# Patient Record
Sex: Male | Born: 1940 | Race: White | Hispanic: No | Smoking: Former smoker
Health system: Southern US, Community
[De-identification: ages and names within clinical notes are randomized; demographics above are authoritative.]

## PROBLEM LIST (undated history)

## (undated) DIAGNOSIS — E7800 Pure hypercholesterolemia, unspecified: Secondary | ICD-10-CM

## (undated) DIAGNOSIS — Z95810 Presence of automatic (implantable) cardiac defibrillator: Secondary | ICD-10-CM

## (undated) DIAGNOSIS — I219 Acute myocardial infarction, unspecified: Secondary | ICD-10-CM

## (undated) DIAGNOSIS — E871 Hypo-osmolality and hyponatremia: Secondary | ICD-10-CM

## (undated) DIAGNOSIS — N182 Chronic kidney disease, stage 2 (mild): Secondary | ICD-10-CM

## (undated) DIAGNOSIS — I272 Pulmonary hypertension, unspecified: Secondary | ICD-10-CM

## (undated) DIAGNOSIS — Z79899 Other long term (current) drug therapy: Secondary | ICD-10-CM

## (undated) DIAGNOSIS — A809 Acute poliomyelitis, unspecified: Secondary | ICD-10-CM

## (undated) DIAGNOSIS — Z7901 Long term (current) use of anticoagulants: Secondary | ICD-10-CM

## (undated) DIAGNOSIS — E875 Hyperkalemia: Secondary | ICD-10-CM

## (undated) DIAGNOSIS — D649 Anemia, unspecified: Secondary | ICD-10-CM

## (undated) DIAGNOSIS — I509 Heart failure, unspecified: Secondary | ICD-10-CM

## (undated) DIAGNOSIS — I4891 Unspecified atrial fibrillation: Secondary | ICD-10-CM

## (undated) DIAGNOSIS — M1990 Unspecified osteoarthritis, unspecified site: Secondary | ICD-10-CM

## (undated) DIAGNOSIS — M109 Gout, unspecified: Secondary | ICD-10-CM

## (undated) DIAGNOSIS — I519 Heart disease, unspecified: Secondary | ICD-10-CM

## (undated) DIAGNOSIS — M19011 Primary osteoarthritis, right shoulder: Secondary | ICD-10-CM

## (undated) DIAGNOSIS — I10 Essential (primary) hypertension: Secondary | ICD-10-CM

## (undated) HISTORY — DX: Essential (primary) hypertension: I10

## (undated) HISTORY — DX: Unspecified atrial fibrillation: I48.91

## (undated) HISTORY — PX: OTHER SURGICAL HISTORY: SHX169

## (undated) HISTORY — DX: Long term (current) use of anticoagulants: Z79.01

## (undated) HISTORY — DX: Chronic kidney disease, stage 2 (mild): N18.2

## (undated) HISTORY — DX: Heart disease, unspecified: I51.9

## (undated) HISTORY — PX: CARDIOVERSION: SHX1299

## (undated) HISTORY — DX: Other long term (current) drug therapy: Z79.899

## (undated) HISTORY — DX: Acute poliomyelitis, unspecified: A80.9

## (undated) HISTORY — DX: Pure hypercholesterolemia, unspecified: E78.00

## (undated) HISTORY — DX: Anemia, unspecified: D64.9

## (undated) HISTORY — PX: CATARACT EXTRACTION, BILATERAL: SHX1313

---
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
## 1998-05-29 HISTORY — PX: KNEE ARTHROSCOPY: SHX127

## 2000-05-29 HISTORY — PX: CORONARY ARTERY BYPASS GRAFT: SHX141

## 2001-05-29 HISTORY — PX: CARDIAC CATHETERIZATION: SHX172

## 2001-10-11 ENCOUNTER — Ambulatory Visit (HOSPITAL_COMMUNITY): Admission: RE | Admit: 2001-10-11 | Discharge: 2001-10-11 | Payer: Self-pay | Admitting: Cardiology

## 2001-11-01 ENCOUNTER — Encounter: Payer: Self-pay | Admitting: Cardiothoracic Surgery

## 2001-11-06 ENCOUNTER — Inpatient Hospital Stay (HOSPITAL_COMMUNITY): Admission: RE | Admit: 2001-11-06 | Discharge: 2001-11-15 | Payer: Self-pay | Admitting: Cardiothoracic Surgery

## 2001-11-06 ENCOUNTER — Encounter: Payer: Self-pay | Admitting: Cardiothoracic Surgery

## 2001-11-07 ENCOUNTER — Encounter: Payer: Self-pay | Admitting: Cardiothoracic Surgery

## 2001-11-08 ENCOUNTER — Encounter: Payer: Self-pay | Admitting: Cardiothoracic Surgery

## 2001-11-09 ENCOUNTER — Encounter: Payer: Self-pay | Admitting: Cardiothoracic Surgery

## 2001-11-13 ENCOUNTER — Encounter: Payer: Self-pay | Admitting: Thoracic Surgery (Cardiothoracic Vascular Surgery)

## 2001-12-03 ENCOUNTER — Encounter (HOSPITAL_COMMUNITY): Admission: RE | Admit: 2001-12-03 | Discharge: 2002-03-03 | Payer: Self-pay | Admitting: Cardiology

## 2002-03-04 ENCOUNTER — Encounter (HOSPITAL_COMMUNITY): Admission: RE | Admit: 2002-03-04 | Discharge: 2002-03-14 | Payer: Self-pay | Admitting: Cardiology

## 2006-08-21 ENCOUNTER — Ambulatory Visit: Admission: RE | Admit: 2006-08-21 | Discharge: 2006-08-21 | Payer: Self-pay | Admitting: Cardiology

## 2006-10-19 ENCOUNTER — Ambulatory Visit (HOSPITAL_COMMUNITY): Admission: RE | Admit: 2006-10-19 | Discharge: 2006-10-19 | Payer: Self-pay | Admitting: Cardiology

## 2007-05-30 HISTORY — PX: CARDIAC CATHETERIZATION: SHX172

## 2008-02-20 ENCOUNTER — Inpatient Hospital Stay (HOSPITAL_BASED_OUTPATIENT_CLINIC_OR_DEPARTMENT_OTHER): Admission: RE | Admit: 2008-02-20 | Discharge: 2008-02-20 | Payer: Self-pay | Admitting: Cardiology

## 2010-01-14 ENCOUNTER — Ambulatory Visit: Payer: Self-pay | Admitting: Cardiology

## 2010-01-28 ENCOUNTER — Ambulatory Visit: Payer: Self-pay | Admitting: Cardiology

## 2010-02-25 ENCOUNTER — Ambulatory Visit: Payer: Self-pay | Admitting: *Deleted

## 2010-03-25 ENCOUNTER — Ambulatory Visit: Payer: Self-pay | Admitting: Cardiology

## 2010-04-15 ENCOUNTER — Ambulatory Visit: Payer: Self-pay | Admitting: Cardiology

## 2010-05-13 ENCOUNTER — Ambulatory Visit: Payer: Self-pay | Admitting: Cardiology

## 2010-05-29 HISTORY — PX: CARDIAC CATHETERIZATION: SHX172

## 2010-06-15 ENCOUNTER — Ambulatory Visit: Payer: Self-pay | Admitting: Cardiovascular Disease

## 2010-07-11 ENCOUNTER — Other Ambulatory Visit (INDEPENDENT_AMBULATORY_CARE_PROVIDER_SITE_OTHER): Payer: Medicare Other

## 2010-07-11 DIAGNOSIS — Z7901 Long term (current) use of anticoagulants: Secondary | ICD-10-CM

## 2010-07-11 DIAGNOSIS — I4891 Unspecified atrial fibrillation: Secondary | ICD-10-CM

## 2010-08-08 ENCOUNTER — Encounter (INDEPENDENT_AMBULATORY_CARE_PROVIDER_SITE_OTHER): Payer: Medicare Other

## 2010-08-08 DIAGNOSIS — Z7901 Long term (current) use of anticoagulants: Secondary | ICD-10-CM

## 2010-08-08 DIAGNOSIS — I4891 Unspecified atrial fibrillation: Secondary | ICD-10-CM

## 2010-08-22 ENCOUNTER — Ambulatory Visit (INDEPENDENT_AMBULATORY_CARE_PROVIDER_SITE_OTHER): Payer: Medicare Other | Admitting: *Deleted

## 2010-08-22 DIAGNOSIS — I4821 Permanent atrial fibrillation: Secondary | ICD-10-CM | POA: Insufficient documentation

## 2010-08-22 DIAGNOSIS — I4891 Unspecified atrial fibrillation: Secondary | ICD-10-CM

## 2010-08-22 DIAGNOSIS — Z7901 Long term (current) use of anticoagulants: Secondary | ICD-10-CM

## 2010-08-22 LAB — POCT INR: INR: 2.2

## 2010-09-26 ENCOUNTER — Encounter: Payer: PRIVATE HEALTH INSURANCE | Admitting: *Deleted

## 2010-09-26 ENCOUNTER — Ambulatory Visit (INDEPENDENT_AMBULATORY_CARE_PROVIDER_SITE_OTHER): Payer: Medicare Other | Admitting: *Deleted

## 2010-09-26 DIAGNOSIS — I4891 Unspecified atrial fibrillation: Secondary | ICD-10-CM

## 2010-09-26 LAB — POCT INR: INR: 2.4

## 2010-09-27 ENCOUNTER — Encounter: Payer: Self-pay | Admitting: Nurse Practitioner

## 2010-09-27 ENCOUNTER — Ambulatory Visit (INDEPENDENT_AMBULATORY_CARE_PROVIDER_SITE_OTHER): Payer: Medicare Other | Admitting: Nurse Practitioner

## 2010-09-27 VITALS — BP 108/62 | HR 51 | Ht 76.0 in | Wt 260.2 lb

## 2010-09-27 DIAGNOSIS — I10 Essential (primary) hypertension: Secondary | ICD-10-CM

## 2010-09-27 DIAGNOSIS — I5022 Chronic systolic (congestive) heart failure: Secondary | ICD-10-CM | POA: Insufficient documentation

## 2010-09-27 DIAGNOSIS — Z7901 Long term (current) use of anticoagulants: Secondary | ICD-10-CM

## 2010-09-27 DIAGNOSIS — I4891 Unspecified atrial fibrillation: Secondary | ICD-10-CM

## 2010-09-27 DIAGNOSIS — R0609 Other forms of dyspnea: Secondary | ICD-10-CM

## 2010-09-27 DIAGNOSIS — I519 Heart disease, unspecified: Secondary | ICD-10-CM

## 2010-09-27 DIAGNOSIS — Z01818 Encounter for other preprocedural examination: Secondary | ICD-10-CM

## 2010-09-27 DIAGNOSIS — E7800 Pure hypercholesterolemia, unspecified: Secondary | ICD-10-CM

## 2010-09-27 DIAGNOSIS — Z79899 Other long term (current) drug therapy: Secondary | ICD-10-CM

## 2010-09-27 DIAGNOSIS — R0600 Dyspnea, unspecified: Secondary | ICD-10-CM

## 2010-09-27 DIAGNOSIS — R0602 Shortness of breath: Secondary | ICD-10-CM

## 2010-09-27 DIAGNOSIS — I2510 Atherosclerotic heart disease of native coronary artery without angina pectoris: Secondary | ICD-10-CM

## 2010-09-27 LAB — BASIC METABOLIC PANEL
BUN: 30 mg/dL — ABNORMAL HIGH (ref 6–23)
CO2: 29 mEq/L (ref 19–32)
Calcium: 9 mg/dL (ref 8.4–10.5)
Chloride: 108 mEq/L (ref 96–112)
Creatinine, Ser: 1.3 mg/dL (ref 0.4–1.5)
GFR: 59.41 mL/min — ABNORMAL LOW (ref >60.00)
Glucose, Bld: 92 mg/dL (ref 70–99)
Potassium: 4.5 mEq/L (ref 3.5–5.1)
Sodium: 143 mEq/L (ref 135–145)

## 2010-09-27 LAB — TSH: TSH: 0.69 u[IU]/mL (ref 0.35–5.50)

## 2010-09-27 LAB — CBC WITH DIFFERENTIAL/PLATELET
Basophils Absolute: 0 10*3/uL (ref 0.0–0.1)
Basophils Relative: 0.5 % (ref 0.0–3.0)
Eosinophils Absolute: 0.1 10*3/uL (ref 0.0–0.7)
Eosinophils Relative: 2.6 % (ref 0.0–5.0)
HCT: 34.5 % — ABNORMAL LOW (ref 39.0–52.0)
Hemoglobin: 11.4 g/dL — ABNORMAL LOW (ref 13.0–17.0)
Lymphocytes Relative: 16 % (ref 12.0–46.0)
Lymphs Abs: 0.9 10*3/uL (ref 0.7–4.0)
MCHC: 33.2 g/dL (ref 30.0–36.0)
MCV: 92.7 fl (ref 78.0–100.0)
Monocytes Absolute: 0.7 10*3/uL (ref 0.1–1.0)
Monocytes Relative: 12.9 % — ABNORMAL HIGH (ref 3.0–12.0)
Neutro Abs: 3.8 10*3/uL (ref 1.4–7.7)
Neutrophils Relative %: 68 % (ref 43.0–77.0)
Platelets: 169 10*3/uL (ref 150.0–400.0)
RBC: 3.72 Mil/uL — ABNORMAL LOW (ref 4.22–5.81)
RDW: 15.6 % — ABNORMAL HIGH (ref 11.5–14.6)
WBC: 5.6 10*3/uL (ref 4.5–10.5)

## 2010-09-27 LAB — BRAIN NATRIURETIC PEPTIDE: Pro B Natriuretic peptide (BNP): 563 pg/mL — ABNORMAL HIGH (ref 0.0–100.0)

## 2010-09-27 LAB — APTT: aPTT: 31.1 s — ABNORMAL HIGH (ref 21.7–28.8)

## 2010-09-27 NOTE — Patient Instructions (Addendum)
We are going to arrange for a cardiac catheterization later this week with Dr. Swaziland. Procedure will be on Thursday May 3rd.    Angiography Angiography is a procedure used to look at the blood vessels (arteries) which carry the blood to different parts of your body. In this procedure a dye is injected through a catheter (a long, hollow tube about the size of a piece of cooked spaghetti) into an artery and x-rays are taken. The x-rays will show if there is a blockage or problem in a blood vessel.  PREPARATION FOR THE PROCEDURE  Let your caregiver know if you have had an allergy to dyes used in x-ray if you have ever had kidney problems or failure.   Do not eat or drink starting from midnight up to the time of the procedure, or as directed.   You may drink enough water to take your medications the morning of the procedure if you were instructed to do so.   You should be at the hospital or outpatient facility where the procedure is to be done prior to the procedure or as directed.  PROCEDURE: 1. You may be given a medication to help you relax before and during the procedure through an IV in your hand or arm.  2. A local anesthetic to make the area numb may be used before inserting the catheter.  3. You will be prepared for the procedure by washing and shaving the area where the catheter will be inserted. This is usually done in the groin but may be done in the fold of your arm by your elbow.  4. A specially trained doctor will insert the catheter with a guide wire into an artery. This is guided under a special type of x-ray (fluoroscopy) to the blood vessel being examined.  5. Special dye is then injected and x-rays are taken. These will show where any narrowing or blockages are located.  AFTER THE PROCEDURE  After the procedure you will be kept in bed for several hours.   The access site will be watched and you will be checked frequently.   Blood tests, other x-rays and an EKG may be done.     You may stay in the hospital overnight for observation.  SEEK IMMEDIATE MEDICAL CARE IF:  You develop chest pain, shortness of breath, feel faint, or pass out.   There is bleeding, swelling, or drainage from the catheter insertion site.   You develop pain, discoloration, coldness, or severe bruising in the leg or arm, or area where the catheter was inserted.   An oral temperature above 100 develops.  Document Released: 02/22/2005 Document Re-Released: 09/14/2007 [facility] Patient Information 2011 [place], [place].  We are also going to check an ultrasound of your heart. We are going to check your labs today. Do not take any more coumadin Increase your intake of greens over the next 2 days.

## 2010-09-27 NOTE — Progress Notes (Signed)
Jason Choi Date of Birth: 1941-05-01   History of Present Illness: Jason Choi is seen today for a work in visit. Jason Choi is seen for Dr. Swaziland. Jason Choi has not been feeling well for the past couple of months. Jason Choi had bronchitis a couple of months ago. Jason Choi was more short of breath and had a significant cough. His bronchitis slowly improved, but his shortness of breath has not. Jason Choi is no longer coughing. Jason Choi has some chest tightness if Jason Choi over exerts himself. Jason Choi is more fatigued. Jason Choi notes his symptoms are identical to how Jason Choi was feeling prior to his CABG that was 10 years ago. Jason Choi does not use NTG. Jason Choi does have more edema. Jason Choi has not used any extra Lasix. Jason Choi has known LV dysfunction with an EF of 32%. Last echo was in 2008.  Jason Choi is not aware of any arrhythmia. Jason Choi is on coumadin.  Current Outpatient Prescriptions on File Prior to Visit  Medication Sig Dispense Refill  . amiodarone (PACERONE) 200 MG tablet Take 200 mg by mouth daily.        Marland Kitchen aspirin 81 MG tablet Take 81 mg by mouth daily.        Marland Kitchen atorvastatin (LIPITOR) 20 MG tablet Take 40 mg by mouth daily.       . carvedilol (COREG) 25 MG tablet Take 25 mg by mouth 2 (two) times daily with a meal.        . furosemide (LASIX) 40 MG tablet Take 40 mg by mouth daily.        . Multiple Vitamin (MULTIVITAMIN) tablet Take 1 tablet by mouth daily.        . potassium chloride SA (K-DUR,KLOR-CON) 20 MEQ tablet Take 20 mEq by mouth daily.        . ramipril (ALTACE) 2.5 MG capsule Take 2.5 mg by mouth daily.        . Warfarin Sodium (COUMADIN PO) Take 5 mg by mouth daily. As directed.         Allergies  Allergen Reactions  . Novocain     Past Medical History  Diagnosis Date  . CAD (coronary artery disease)   . S/P CABG (coronary artery bypass graft) 2002    SVG to PDA, Free radial to Intermediate, LIMA to LAD  . HTN (hypertension)   . Hypercholesteremia   . LV dysfunction     EF 35 to 40%  . Atrial fibrillation   . Chronic anticoagulation   . Polio   .  High risk medication use     on amiodarone    Past Surgical History  Procedure Date  . Coronary artery bypass graft   . Cardiac catheterization 2009    Grafts patent  . Right leg surgeries     due to polio  . Left knee arthroscopy     History  Smoking status  . Former Smoker  Smokeless tobacco  . Not on file  Comment: stopped smoking over 30 years ago.    History  Alcohol Use  . Yes    occasional beer    Family History  Problem Relation Age of Onset  . Valvular heart disease Father     Review of Systems: The review of systems is as above.  All other systems were reviewed and are negative.  Physical Exam: BP 108/62  Pulse 51  Ht 6\' 4"  (1.93 m)  Wt 260 lb 3.2 oz (118.026 kg)  BMI 31.67 kg/m2 Patient is very pleasant and in no  acute distress. Jason Choi is obese and of large build.  Skin is warm and dry. Color is normal.  HEENT is unremarkable. Normocephalic/atraumatic. PERRL. Sclera are nonicteric. Neck is supple. No masses. No JVD. Lungs are clear. Cardiac exam shows a regular rate and rhythm. Jason Choi is bradycardic. Abdomen is soft and obese. Extremities are with 1+ edema, left greater than right. Right leg is atrophied due to polio. Gait and ROM are intact. No gross neurologic deficits noted.  LABORATORY DATA:  EKG shows sinus bradycardia. INR yesterday was 2.4   Assessment / Plan:

## 2010-09-27 NOTE — Assessment & Plan Note (Signed)
Blood pressure is satisfactory and actually on the lower side of normal. May consider titration of his ACE in the future.

## 2010-09-27 NOTE — Assessment & Plan Note (Signed)
BNP is checked today. He will also have a right heart catheterization on Thursday with Dr. Swaziland and we will arrange for a repeat Echo.

## 2010-09-27 NOTE — Assessment & Plan Note (Addendum)
He is having symptoms identical to his prior chest pain syndrome that led to CABG. I have discussed his care with Dr. Swaziland. We will arrange for a repeat cardiac cath. His last stress test in 2009 was abnormal and led to cardiac cath. At that time his grafts were patent and medical management was recommended. The procedure was reviewed with him including the risks and benefits and he is agreeable to proceed.

## 2010-09-27 NOTE — Assessment & Plan Note (Signed)
INR was 2.4 yesterday.  We will hold his coumadin and he will increase his intake of dark greens today and tomorrow.

## 2010-09-27 NOTE — Assessment & Plan Note (Signed)
He remains in sinus. He is on amiodarone. May need to consider PFT's on a return visit.

## 2010-09-29 ENCOUNTER — Other Ambulatory Visit: Payer: Self-pay | Admitting: Cardiology

## 2010-09-29 ENCOUNTER — Other Ambulatory Visit: Payer: Self-pay | Admitting: *Deleted

## 2010-09-29 ENCOUNTER — Ambulatory Visit (HOSPITAL_COMMUNITY): Payer: Medicare Other

## 2010-09-29 ENCOUNTER — Ambulatory Visit (HOSPITAL_COMMUNITY)
Admission: RE | Admit: 2010-09-29 | Discharge: 2010-09-29 | Disposition: A | Discharge status: Home / Self Care | Payer: Medicare Other | Source: Ambulatory Visit | Attending: Cardiology | Admitting: Cardiology

## 2010-09-29 ENCOUNTER — Telehealth: Payer: Self-pay | Admitting: *Deleted

## 2010-09-29 DIAGNOSIS — I2510 Atherosclerotic heart disease of native coronary artery without angina pectoris: Secondary | ICD-10-CM | POA: Insufficient documentation

## 2010-09-29 DIAGNOSIS — I2789 Other specified pulmonary heart diseases: Secondary | ICD-10-CM | POA: Insufficient documentation

## 2010-09-29 DIAGNOSIS — Z951 Presence of aortocoronary bypass graft: Secondary | ICD-10-CM | POA: Insufficient documentation

## 2010-09-29 DIAGNOSIS — I519 Heart disease, unspecified: Secondary | ICD-10-CM | POA: Insufficient documentation

## 2010-09-29 LAB — POCT I-STAT 3, VENOUS BLOOD GAS (G3P V)
Acid-base deficit: 4 mmol/L — ABNORMAL HIGH (ref 0.0–2.0)
Bicarbonate: 21.9 mEq/L (ref 20.0–24.0)
O2 Saturation: 58 %
TCO2: 23 mmol/L (ref 0–100)

## 2010-09-29 LAB — POCT I-STAT 3, ART BLOOD GAS (G3+)
O2 Saturation: 96 %
pO2, Arterial: 77 mmHg — ABNORMAL LOW (ref 80.0–100.0)

## 2010-09-29 MED ORDER — SPIRONOLACTONE 25 MG PO TABS: 25.0000 mg | ORAL_TABLET | Freq: Every day | ORAL | Status: DC

## 2010-09-29 MED ORDER — FUROSEMIDE 40 MG PO TABS: 40.0000 mg | ORAL_TABLET | Freq: Two times a day (BID) | ORAL | Status: DC

## 2010-09-29 NOTE — Telephone Encounter (Signed)
Per Dr. Swaziland:  Call in Lasix 40mg  BID and Aldactone 25mg  QD to [facility].  Pt notified

## 2010-09-29 NOTE — Telephone Encounter (Signed)
Refills to [facility] done

## 2010-09-30 NOTE — Telephone Encounter (Signed)
escribe medication per fax request  

## 2010-10-10 ENCOUNTER — Telehealth: Payer: Self-pay | Admitting: *Deleted

## 2010-10-10 ENCOUNTER — Ambulatory Visit (HOSPITAL_COMMUNITY): Payer: Medicare Other | Attending: Nurse Practitioner | Admitting: Radiology

## 2010-10-10 ENCOUNTER — Telehealth: Payer: Self-pay | Admitting: Cardiology

## 2010-10-10 DIAGNOSIS — I2510 Atherosclerotic heart disease of native coronary artery without angina pectoris: Secondary | ICD-10-CM

## 2010-10-10 DIAGNOSIS — I519 Heart disease, unspecified: Secondary | ICD-10-CM | POA: Insufficient documentation

## 2010-10-10 NOTE — Telephone Encounter (Signed)
Notified of echo results. Dr. Swaziland wants to cancel the app w/ Dr. Ladona Ridgel for now. Will see Lawson Fiscal 5/22.

## 2010-10-10 NOTE — Telephone Encounter (Signed)
Pt said he is returning your call from last wed please call

## 2010-10-10 NOTE — Telephone Encounter (Signed)
Message copied by Murrell Redden on Mon Oct 10, 2010  5:29 PM ------      Message from: Sw[place]iland, PETER      Created: Mon Oct 10, 2010  3:00 PM       I disagree with reported EF by Echo. He has severe hypokinesis to akinesis of the inferior wall and distal septum. EF looks more like 35-40%. Still in all it looks a little better than cath.       Theron Arista Swaziland

## 2010-10-10 NOTE — Telephone Encounter (Signed)
Had left message last week that 2 medications had been called in to pharm: aldactone and lasix. States he has been out of town.

## 2010-10-10 NOTE — Cardiovascular Report (Signed)
NAMEAMERY, MINASYAN NO.:  [id number]  MEDICAL RECORD NO.:  [id number]           PATIENT TYPE:  O  LOCATION:  [facility]                         FACILITY:  [facility]  PHYSICIAN:  Moesha Sarchet M. Choi, M.D.  DATE OF BIRTH:  1940-12-08  DATE OF PROCEDURE:  09/29/2010 DATE OF DISCHARGE:                           CARDIAC CATHETERIZATION   INDICATION FOR PROCEDURE:  The patient is a 70 year old white male who is status post coronary artery bypass surgery in 2002.  He has a history of left ventricular dysfunction.  He presents with symptoms of increasing shortness of breath over the past 2 months associated with chest tightness with exertion and fatigue.  PROCEDURES:  Right and left heart catheterization, coronary and left ventricular angiography.  ACCESS:  Via the right femoral artery and vein using standard Seldinger technique.  EQUIPMENT:  A 6-French 4 cm right and left Judkins catheter, 6-French pigtail catheter, 6-French LIMA catheter, 6-French LCB catheter, 6- French arterial sheath, 7-French venous sheath, 7-French balloon-tip Swan-Ganz catheter.  MEDICATIONS:  Local anesthesia with 1% Xylocaine.  CONTRAST:  110 mL of Omnipaque.  HEMODYNAMIC DATA:  Right atrial pressure is 14 mmHg.  Right ventricular pressure is 64 with an EDP of 17 mmHg.  Pulmonary artery pressure is 54/23 with a mean of 34 mmHg.  Pulmonary capillary wedge pressure is 40 with a mean of 30 mmHg.  Aortic pressure is 109/55 with a mean of 76 mmHg.  Left ventricular pressure is 109 with an EDP of 21 mmHg.  Cardiac output by Fick is 5.25 L per minute with an index of 2.14.  By thermodilution it is 5.09 with an index of 2.08.  There is no significant mitral or aortic valve gradient.  ANGIOGRAPHIC DATA:  Left ventricular angiography performed in the RAO view demonstrates an enlarged left ventricular chamber with basal to mid inferior wall akinesia.  There is severe global hypokinesia with  overall ejection fraction of 25% to 30%.  The left main coronary artery has 30% distal stenosis.  The left anterior descending artery is occluded after the first septal perforator and first diagonal.  The ramus intermediate branch is occluded proximally.  The left circumflex coronary artery is seen in the AV groove.  It has a 70% stenosis in the midvessel.  The first marginal branch is a small vessel and has a 90% proximal stenosis.  The second marginal branch is occluded.  The right coronary artery is a large dominant vessel.  It is occluded distally.  There are three posterolateral branches that fill by left-to- right collaterals.  Saphenous vein graft to the PDA is patent.  There is a free radial graft to the intermediate that is widely patent.  The LIMA graft to the LAD is widely patent.  FINAL INTERPRETATION: 1. Severe three-vessel obstructive atherosclerotic coronary artery     disease. 2. All grafts are patent including grafts to the PDA, intermediate     branch, and LAD. 3. Severe left ventricular dysfunction. 4. Moderate-to-severe pulmonary hypertension with elevated left     ventricular filling pressures.  PLAN:  We would recommend continued medical therapy with more aggressive diuretic therapy.  The patient will need to be considered for prophylactic ICD placement.          ______________________________ Jason Choi, M.D.     PMJ/MEDQ  D:  09/29/2010  T:  09/29/2010  Job:  [id number]  Electronically Signed by Vaeda Westall Choi M.D. on 10/10/2010 09:37:04 AM

## 2010-10-11 NOTE — H&P (Signed)
NAME:  Jason Choi, Jason Choi NO.:  [id number]   MEDICAL RECORD NO.:  [id number]          PATIENT TYPE:  OUT   LOCATION:  CARD                         FACILITY:  [facility]   PHYSICIAN:  Peter M. Swaziland, M.D.  DATE OF BIRTH:  1940-09-09   DATE OF ADMISSION:  08/21/2006  DATE OF DISCHARGE:  08/21/2006                              HISTORY & PHYSICAL   HISTORY OF PRESENT ILLNESS:  Mr. Laymon is a 70 year old white male who  presented with new onset of congestive heart failure in March of this  year.  He was found at that time to have new onset of atrial  fibrillation.  This was felt to have developed after an acute  respiratory infection.  The patient was subsequently anticoagulated.  His rate was controlled.  We treated his congestive heart failure with  good resolution of his volume overload.  He had an echocardiogram on  08/10/2006 which showed mild concentric LVH with global hypokinesia and  inferior wall akinesia.  There was overall moderate left ventricular  dysfunction with ejection fraction of 35-40%.  He had moderate left  atrial enlargement and mild mitral and tricuspid insufficiency.  The  patient also had baseline pulmonary function studies which demonstrated  a mild obstructive defect.  There was also mild decrease his diffusion  capacity.  He was orally loaded with amiodarone, but despite good rate  control he has failed to convert on medical therapy.  He is now admitted  to this time for elective cardioversion.   PAST MEDICAL HISTORY:  1. Congestive heart failure recent onset with moderate left      ventricular systolic dysfunction, acute on chronic.  2. Atrial fibrillation persistent.  3. Coronary disease status post CABG in 2003.  4. Hypertension.  5. History of recurrent upper respiratory infections.  6. Remote history of polio.  The patient has also had seven prior      operations on his right leg due to his polio and prior left knee      arthroscopy.   ALLERGIES:  He reports intolerance to NOVOCAIN which makes and  lightheaded.   CURRENT MEDICATIONS:  Coumadin 5 mg daily.  1. Altace 5 mg daily.  2. Lipitor 10 mg per day.  3. Coreg 25 mg b.i.d.  4. Lasix 40 mg per day.  5. Potassium 20 mEq per day.  6. Amiodarone 200 mg b.i.d.   SOCIAL HISTORY:  The patient is employed at [employer].  He quit smoking  30 years ago.  He drinks occasional beer.  He is married, has one child.   FAMILY HISTORY:  Father died at age 57 with aortic insufficiency.  Mother died age 92 of natural causes.  He has one sister who is alive  and well.   REVIEW OF SYSTEMS:  Is otherwise unremarkable.   PHYSICAL EXAMINATION:  GENERAL:  A pleasant white male in no apparent  distress.  VITAL SIGNS:  His weight is 243, blood pressure 120/70, pulse is 71 and  irregular.  HEENT EXAM:  He is normocephalic, atraumatic.  Pupils equal, round,  reactive to light and accommodation.  Extraocular movements is full.  Oropharynx is clear.  NECK:  Neck is supple without JVD, adenopathy, thyromegaly or bruits.  LUNGS:  Clear.  CARDIAC EXAM:  Reveals irregular rate and rhythm without gallop, murmur  or click.  ABDOMEN:  Soft, nontender without masses or bruits.  EXTREMITIES:  Without edema.  Pulses were 2+ and symmetric.  NEUROLOGICAL EXAM:  Nonfocal.   LABORATORY DATA:  ECG demonstrates atrial fibrillation with a  nonspecific ST-T wave abnormality.  Glucose is 89, BUN 25, creatinine  1.3, sodium 141, potassium 4.7, chloride 102, CO2 29, white count 7400,  hemoglobin 13.9, hematocrit 40.7, platelets 258,000.  INR on Oct 12, 2006 was 2.8.   IMPRESSION:  1. Persistent atrial fibrillation.  2. Congestive heart failure, acute on chronic left ventricular      systolic dysfunction.  3. Coronary disease status post coronary artery bypass graft.  4. Chronic anticoagulation.  5. Hypertension.  6. Recurrent upper respiratory infection.   PLAN:  The patient will be admitted  for outpatient elective  cardioversion.           ______________________________  Peter M. Swaziland, M.D.     PMJ/MEDQ  D:  10/17/2006  T:  10/17/2006  Job:  [id number]   cc:   Ace Gins, MD

## 2010-10-11 NOTE — Cardiovascular Report (Signed)
NAMELEILAN, Jason Choi NO.:  [id number]   MEDICAL RECORD NO.:  [id number]          PATIENT TYPE:  OIB   LOCATION:  1965                         FACILITY:  [facility]   PHYSICIAN:  Peter M. Swaziland, M.D.  DATE OF BIRTH:  10-Oct-1940   DATE OF PROCEDURE:  02/20/2008  DATE OF DISCHARGE:                            CARDIAC CATHETERIZATION   INDICATIONS FOR PROCEDURE:  A 70 year old white male status post  coronary artery bypass surgery in 2001.  He has a history of chronic  atrial fibrillation, hypertension, and hypercholesterolemia.  Recent  stress Cardiolite was abnormal showing inferolateral and apical defects  with both fixed and reversible components.  Ejection fraction was  decreased to 32%.   PROCEDURE:  Left heart catheterization, coronary and left ventricular  angiography, saphenous vein graft angiography x2 and LIMA graft  angiography.   ACCESS:  Via right femoral artery using standard Seldinger technique.   EQUIPMENT:  A 4-French, 5-cm left Judkins catheter, 4-French 3-DRC  catheter, 4-French LCB catheter, 4-French pigtail catheter, and 4-French  arterial sheath.   MEDICATIONS:  Local anesthesia 1% Xylocaine.   CONTRAST:  A 125 mL of Omnipaque.   HEMODYNAMIC DATA:  Aortic pressure is 131/62 with a mean of 91 mmHg.  Left ventricular pressure is 132 with EDP of 23 mmHg.   ANGIOGRAPHIC DATA:  The left coronary artery arises and distributes  normally.  The left main coronary has 30-40% disease distally.   The left anterior descending artery has an eccentric 80-90% proximal  stenosis followed by total occlusion in the midvessel.   The left circumflex coronary is small in caliber.  There is 50-60%  disease in the proximal vessel.  There is a very small first obtuse  marginal vessel which has an 80-90% ostial stenosis.  The ongoing  circumflex in the AV groove is without significant disease.   The right coronary artery arises and distributes normally.  It is  a  dominant vessel.  It is occluded distally.  The posterolateral branches  fill very well by left-to-right collaterals.   The saphenous vein graft to the PDA is widely patent with good runoff.  PDA is very small in caliber.   The free radial artery graft to the intermediate branch is widely  patent.  This was a moderate-to-large branch and appears normal.   The LIMA graft to LAD is widely patent with good distal runoff.   Left ventricular angiography was performed in RAO view.  This  demonstrates severe inferior wall hypokinesia.  There is mild left  ventricular dilatation and overall moderate-to-severe left ventricular  dysfunction with an ejection fraction estimated at 35-40%.   FINAL INTERPRETATION:  1. Severe 3-vessel obstructive atherosclerotic coronary artery      disease.  2. All grafts were patent including saphenous vein graft to the      posterior descending artery, radial artery graft to the      intermediate branch, and left internal mammary graft to the left      anterior descending.  3. Moderate-to-severe left ventricular dysfunction.   PLAN:  We would recommend continued medical therapy.  ______________________________  Peter M. Swaziland, M.D.     PMJ/MEDQ  D:  02/20/2008  T:  02/21/2008  Job:  [id number]   cc:   Ace Gins, MD

## 2010-10-11 NOTE — H&P (Signed)
NAMECHRISTIAN, BORGERDING NO.:  [id number]   MEDICAL RECORD NO.:  [id number]          PATIENT TYPE:  OIB   LOCATION:  NA                           FACILITY:  [facility]   PHYSICIAN:  Peter M. Swaziland, M.D.  DATE OF BIRTH:  [birth year]   DATE OF ADMISSION:  08/20/2007  DATE OF DISCHARGE:                              HISTORY & PHYSICAL   HISTORY OF PRESENT ILLNESS:  Mr. Jason Choi is a 70 year old white male  with known history of coronary artery disease.  He is status post  coronary artery bypass surgery in 2003 by Dr. Kathlee Nations Trigt, this  include an LIMA graft to the LAD, a radial artery graft to the ramus  intermediate, and a vein graft to the posterior descending artery.  He  also has a history of atrial fibrillation.  He has been on chronic  amiodarone therapy and chronic anticoagulation.  He has had congestive  heart failure.  The patient is actually felt fairly well.  Recently, he  has had no significant chest pain, palpitations, increased dyspnea or an  edema.  He had had no orthopnea or PND.  He did have a followup stress  Cardiolite study recently.  He was able to walk 5 minutes on the Bruce  protocol.  He was limited by dyspnea and leg fatigue.  He had no  significant ectopy.  His Cardiolite images demonstrate a moderate  inferior and inferolateral wall defect as well as an apical defect and  these defects were partially reversible.  His ejection fraction was  decreased to 32% compared to his prior stress test in July 2005.  The  apical defect was new and his LV function had deteriorated.  For that  reason, we had recommended followup cardiac catheterization to see if he  needed further revascularization or to consider whether he would be a  candidate for a defibrillator.   PAST MEDICAL HISTORY:  1. Coronary artery disease, status post CABG in 2003.  2. Atrial fibrillation, controlled on amiodarone therapy.  3. Hypertension.  4. Hypercholesterolemia.  5.  Congestive heart failure.  6. History of recurrent upper respiratory infections.  7. Remote history of polio with seven prior operations on his right      leg due to his polio.  The patient has also had previous left knee      arthroscopy.   His current medications include,  1. Multivitamin daily.  2. Altace 2.5 mg daily.  3. Lipitor 10 mg per day.  4. Coreg 25 mg twice a day.  5. Lasix 40 mg per day.  6. Potassium 20 mEq per day.  7. Amiodarone 200 mg per day.  8. Coumadin, dose has been 5 mg 6 days a week, and 2.5 mg once a week.      His Coumadin has been held for this procedure.   SOCIAL HISTORY:  The patient is employed at [employer].  He quit smoking  over 30 years ago.  Drinks occasional beer.  He is married and has one  child.   FAMILY HISTORY:  Father died at age 52 with complications  of aortic  insufficiency.  Mother died at age 33 of natural causes.  He has one  sister who is alive and well.   His review of systems is otherwise completely unremarkable.   PHYSICAL EXAM:  GENERAL:  The patient is well-developed, white male in  no apparent distress.  VITAL SIGNS:  His weight is 251, blood pressure 124/70, pulse 60 and  regular.  HEENT:  His pupils are equal, round, and reactive to light and  accommodation.  Extraocular movements are full.  Oropharynx is clear.  NECK:  Supple without JVD, adenopathy, thyromegaly, or bruits.  LUNGS:  Clear to auscultation and percussion.  CARDIAC:  Regular rate and rhythm without gallop, murmur, rub, or click.  ABDOMEN:  Soft, nontender without mass or bruits.  EXTREMITIES:  Without significant edema.  His pedal pulses are palpable.  NEUROLOGIC:  Nonfocal.   LABORATORY DATA:  His ECG shows normal sinus rhythm with nonspecific ST  abnormality.  Chest x-ray showed no active disease.  His protime was  15.4 with an INR of 1.5, glucose 121, BUN 20, creatinine 1.0, sodium  141, potassium 4.8.  CBC was normal.   IMPRESSION:  1. Coronary  artery disease, status post coronary artery bypass      grafting x3 in 2003.  The patient has abnormal stress Cardiolite      study showing new apical defect as well as decreased left      ventricular function.  2. Congestive heart failure, chronic class II secondary to systolic      dysfunction.  3. Atrial fibrillation, controlled on amiodarone.  4. Hypertension.   PLAN:  We will proceed with diagnostic cardiac catheterization with  further therapy pending these results.           ______________________________  Peter M. Swaziland, M.D.     PMJ/MEDQ  D:  02/19/2008  T:  02/20/2008  Job:  [id number]   cc:   Ace Gins, MD

## 2010-10-11 NOTE — Op Note (Signed)
NAMEABANOUB, HANKEN NO.:  [id number]   MEDICAL RECORD NO.:  [id number]          PATIENT TYPE:  OIB   LOCATION:  2867                         FACILITY:  [facility]   PHYSICIAN:  Peter M. Swaziland, M.D.  DATE OF BIRTH:  April 26, 1941   DATE OF PROCEDURE:  10/19/2006  DATE OF DISCHARGE:                               OPERATIVE REPORT   INDICATIONS:  A 70 year old white male with history of coronary disease  status post CABG.  He has persistent atrial fibrillation and symptoms of  congestive heart failure.   PROCEDURE:  Elective cardioversion.   PROCEDURE NOTE:  The patient received anesthesia with 300 mg of IV  Pentothal.  He subsequently received a single synchronized biphasic DC  shock at 150 joules with prompt restoration of sinus rhythm.  There were  no complications.   IMPRESSION:  Successful elective cardioversion.           ______________________________  Peter M. Swaziland, M.D.     PMJ/MEDQ  D:  10/19/2006  T:  10/19/2006  Job:  [id number]   cc:   Ace Gins, MD

## 2010-10-14 NOTE — H&P (Signed)
[place]  Patient:    Jason Choi, Jason Choi Visit Number: [id number] MRN: [id number]          Service Type: CAT Attending Physician:  Swaziland, Peter M Md Dictated by:   Peter M. Swaziland, M.D. Admit Date:  10/11/2001 Discharge Date: 10/11/2001   CC:         Jason Choi, M.D.                         History and Physical  DATE OF BIRTH:  1940-07-25  HISTORY OF PRESENT ILLNESS:  [name] is a 70 year old white male who presented last month with new onset of congestive heart failure.  The patient had been having increasing shortness of breath and lack of energy forthe past year.  It intensified in January of this year.  He developed pneumonia at that time that was treated as an outpatient.  Despite clearingof his pneumonia, he had persistent shortness of breath, later developing lower extremity edema. He denied any symptoms of chest pain or prior myocardial infarction.  Chest x-ray demonstrated interval increase in heart size and vascular congestion. An echocardiogram was obtained which showed a dilated left ventricle with severe global hypokinesia and an ejection fraction of 25-35%; there was also evidence of severe pulmonary hypertension with estimated right ventricular systolic pressure of 73 mmHg.  Hehad moderate tricuspid and mitral insufficiency and the left atrium was severely enlarged.  The patient was subsequently referred for evaluation. He had previously been on verapamil for blood pressure control; this was stopped and he was begun on diuretics and ACE inhibitor.  He seemed to respond very well to this therapy, with resolution of his shortness of breath and improvement in his energy level.  He subsequently underwent evaluation with a nuclear stress test.  The patient was only able to exercisefor four minutes on the Bruce protocol before test had to be stopped dueto dyspnea and leg fatigue.  He did have ST segment changes in  the lateral leads consistent with ischemia.  Subsequently Cardiolite images demonstrated multiple perfusion defects involving the anterior septum, apex and inferolateral wall with component of ischemia in these areas.  He once again had severe left ventricular dysfunction with ejection fraction of 30%. Because of his abnormal Cardiolite study, he is now admitted for cardiac catheterization for further evaluation of ischemic heart disease.  PAST MEDICAL HISTORY:  History of pneumonia in January of this year.  History of long-standing hypertension.  History of polio as a child requiring seven previous right leg operations for polio.  He has had previous left knee arthroscopy.  ALLERGIES:  NOVOCAINE in the past has made him light-headed.   CURRENT MEDICATIONS: 1. Glucosamine two tablets daily. 2. Ginkgo 40 mg per day. 3. Multivitamin daily. 4. Altace 5 mg b.i.d. 5. Lasix 40 mg per day. 6. Aspirin 81 mg per day. 7. Coreg 6.25 mg b.i.d.  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 He is married.  He drinks occasional beer.  He quit smoking 26 years ago.  FAMILY HISTORY:  Father died at age 31 with a history of cardiac murmur and aortic insufficiency.  Motherdied at age 83 of natural causes.  One sister is alive and well.  REVIEW OF SYSTEMS:  Review of systems is remarkable for resolution of his lower extremity edema and fatigue.  He has no orthopnea or PND.  He denies any chest  pain.  He has had a persistent productive cough.  He has no TIAor stroke.  Other review of systems are negative.  PHYSICAL EXAMINATION:  GENERAL:  The patient is an obese white male in no apparentdistress.  Weight is 242.  VITAL SIGNS:  Blood pressure is 150/90.  Pulse 70 and regular.  HEENT:  Unremarkable.  Pupils are equal, round and reactive.  Conjunctivae are clear.  Oropharynx is clear.  NECK:  Without JVD, adenopathy or bruits.  No jugular venous  distention.  LUNGS:  Clear.  CARDIAC:  Regular rate and rhythm with a grade 1-2/6 apical systolic murmur. There is no S3.  ABDOMEN:  Obese,soft and nontender without hepatosplenomegaly, masses or bruits.  EXTREMITIES:  Femoral and pedal pulses are 2+ and symmetric.  He has chronic wasting in his right leg due to polio.  He has no significant edema atthis time.  NEUROLOGIC:  Exam is nonfocal.  LABORATORY AND ACCESSORY DATA:  ECG shows normal sinus rhythm with left atrial enlargement and nonspecific T wave abnormality.  IMPRESSION: 1. Congestive heart failure, now well-compensated. 2. Ischemic cardiomyopathy withmarkedly abnormal Cardiolite study. 3. Hypertension. 4. Recurrent bronchitis. 5. Remote history of polio.  PLAN:  The patient will be admitted for cardiac catheterization, with further therapy pending these results. Dictated by:   Peter M. Swaziland, M.D. Attending Physician:  [place]il[place], Peter M Md DD:  10/10/01 TD:  10/12/01 Job: [id number]

## 2010-10-14 NOTE — Procedures (Signed)
[place]  Patient:    Jason Choi, Jason Choi Visit Number: [id number] MRN: [id number]          Service Type: SUR Location: 2300 2301 01 Attending Physician:  Mikey Bussing Dictated by:   Shela Commons. Claybon Jabs, M.D. Proc. Date: 11/06/01 Admit Date:  11/06/2001                             Procedure Report  PROCEDURE:  Transesophageal echocardiogram.  DIAGNOSES: 1. Coronary artery disease. 2. Mitral regurgitation.  INDICATIONS FOR PROCEDURE:  Mr. Stephenson Cichy is a 70 year old white male who presents to the operating room for coronary artery bypass grafting and possible mitral valve repair.  Dr. Kathlee Nations Trigt requested transesophageal echocardiogram for the management of this patient intraoperatively.  DESCRIPTION OF PROCEDURE:  Following routine cardiac induction, the transesophageal probe was carefully lubricated and inserted into the patients esophagus for cardiac imaging.  Initially, there was difficulty with the probe.  The multi-plane imaging function of the probe would not work properly, so after diagnosis with the echo-text we were able to do some manipulation with the multi-plane manually with the console, although this was doable, it was not optimal for all imaging planes.  As well, images of the heart demonstrated a large heart with no evidence of pericardial effusion.  The right atrium was normal in size.  The atrial septal showed no evidence of defect.  There was no evidence of masses in the right atrium.  Tricuspid valve appeared normal in structure and function with trace regurgitation in the pulmonary catheter passing through the valve.  The right ventricle appeared to be functioning well, no evidence of regional wall motion abnormalities with the Swan visualized passing into the RV and out the RV outflow tract.  The left atrium was normal in size.  There was no evidence of masses in appendage or the atrium.  The mitral valve  appeared to be overall slightly thickened, but no evidence of flailed leaflets, masses, or excess calcium.  There was a mild jet of regurgitation which appeared to be central.  Both the right and left pulmonary veins were evaluated with Doppler, and these did not show abnormal flow patterns.  The left ventricle was then evaluated which showed some overall decreased contractility may be in the 40% ejection fraction range.  There was no evidence of regional wall motion abnormality, and the aortic valve appeared normal as well.  There were three leaflets of the aortic valve.  There was no regurgitation or stenosis noted.  The decision was made not to repair the mitral valve.  The patient successfully separated from cardiopulmonary bypass, and overall mitral valve appeared to continue functioning as pre-bypass with mild mitral regurgitation.  The left ventricle showed some slightly decreased contractility, but no evidence of regional wall motion abnormalities.  The probe was carefully removed, and the patient was taken to the SICU in good condition. Dictated by:   Shela Commons. Claybon Jabs, M.D. Attending Physician:  Mikey Bussing DD:  11/06/01 TD:  11/08/01 Job: [id number]

## 2010-10-14 NOTE — Discharge Summary (Signed)
[place]  Patient:    Jason Choi, Jason Choi Visit Number: [id number] MRN: [id number]          Service Type: SUR Location: 2000 2040 01 Attending Physician:  Mikey Bussing Dictated by:   Dominica Severin, P.A. Admit Date:  11/06/2001 Discharge Date: 11/15/2001   CC:         Peter M. Swaziland, M.D.   Discharge Summary  DATE OF BIRTH:  1941/05/17  PRIMARY ADMISSION DIAGNOSIS:  Coronary artery disease and mitral regurgitation.  SECONDARY DIAGNOSES/PAST MEDICAL HISTORY: 1. Polio as a child with muscular atrophy of his right leg. 2. Hypertension. 3. Pneumonia in January 2003. 4. Surgical history positive for arthroscopy of the left knee and several    operations in the right leg for sequelae of poliomyelitis.  NEW DIAGNOSES/DISCHARGE DIAGNOSES: 1. Coronary artery disease, status post coronary artery bypass graft surgery    x3. 2. Postoperative atrial fibrillation with rapid ventricular response. 3. Postoperative nausea and vomiting.  PROCEDURES:  Coronary artery bypass graft surgery x3 with the following grafts placed:  Saphenous vein graft to the posterior descending, left radial artery to the ramus intermediate, and left internal mammary artery to the left anterior descending artery done on November 06, 2001.  HOSPITAL COURSE:  This patient was seen in consultation in our office by Dr. Donata Clay.  He is a 70 year old Caucasian male for evaluation of a potential surgical coronary vascularization.  He was diagnosed with three-vessel coronary artery disease and reduced left ventricular function. The patient has been ill since January 2003 when he developed pneumonia and was treated with outpatient antibiotics.  In addition, he had shortness of breath, dyspnea on exertion, productive cough, and lower extremity edema. With antibiotics, his productive cough improved but shortness of breath and edema did not.  Chest x-ray was performed and showed  cardiomegaly and pulmonary vascular congestion.  A 2-D echocardiogram was performed which showed reduced LV function with EF of 25%-35% and dilated left ventricle and a moderate mitral and tricuspid regurgitation.  He was evaluated by Dr. Swaziland. He underwent a cardiac catheterization which revealed stenosis of the diagonal and circumflex and occlusion of the right coronary artery and there was global hypokinesis with inferior and apical akinesis and an ejection fraction of 30%. There was mild to moderate mitral regurgitation.  Because of his severe three-vessel coronary artery disease and reduced left ventricular function, he was felt to be a candidate for coronary vascularization.  With adjustment of his medications, the patients symptoms of congestive heart failure have improved and he has finished his antibiotics.  It was determined that he was stable for bypass surgery, which he underwent on November 06, 2001.  Initially, postoperatively, he remained stable with a stable rhythm and was extubated later that evening of his surgery.  On postoperative day #1, neurologically he was intact.  He had good urine output.  He did have a small air leak from his mediastinal tubes; otherwise he was doing well.  His hematocrit was 35%.  On postoperative day #2, he was found to be in rapid atrial fibrillation.  He was started on amiodarone and Lovenox.  He was transferred to unit 2000.  His rate was controlled with amiodarone.  He remained stable from a pulmonary standpoint.  He did have a postoperative GI complication of some nausea and vomiting, which has since resolved.  Nevertheless, his main postoperative complication was atrial fibrillation, which he was started on anticoagulation therapy.  In  addition, he did have some blood pressure management problems with adjustment of his Coreg and other medications.  This eventually stabilized.  He was started on Coumadin.  Once his INR was therapeutic, he  was discharged home.  His blood pressure was stable.  His rate was controlled and he was discharged home in stable condition with home health nurses to discontinue his chest tube sutures and staples in one week.  He was ambulating without difficulty with cardiac rehab.  His wounds remained clean and dry without any signs of infection.  His left radial artery harvest site was clean and dry.  He had full function and sensation of his right hand.  DISCHARGE MEDICATIONS:  1. Lipitor 10 mg daily.  2. Altace 2.5 mg twice a day.  3. Amiodarone 200 mg 2 tablets twice a day.  4. Coumadin 5 mg tablets.  He is to take as directed by Dr. Swaziland.  5. Aspirin 81 mg.  6. Coreg 25 mg twice a day.  7. Digoxin 0.125 mg daily.  8. Diltiazem 180 mg daily.  9. Lasix 40 mg daily. 10. Potassium chloride 20 mEq daily. 11. Tylox 1-2 tablets every four hours as needed for pain.  ACTIVITY AND FOLLOWUP:  The patient was instructed not to do any driving, heavy lifting, or strenuous activity.  He is to walk daily and continue breathing exercises.  He is to follow a heart healthy diet.  He was told he may shower.  He is to notify the office with any increased temperature greater than 101 degrees Fahrenheit or if there is any increased redness, swelling, or drainage from his incision.  He is to have his staples taken out in one week by home health nurse.  He is to have his blood work checked by home health nurse and faxed to Dr. Illa Level office and Dr. Swaziland is to see him in two weeks with a chest x-ray.  He is to see Dr. Donata Clay in three weeks and the office will call him with that appointment.  He is instructed to bring his x-ray with him. Dictated by:   Dominica Severin, P.A. Attending Physician:  Mikey Bussing DD:  12/05/01 TD:  12/09/01 Job: [id number]

## 2010-10-14 NOTE — Op Note (Signed)
[place]. [facility]  Patient:    Jason Choi, Jason Choi Visit Number: [id number] MRN: [id number]          Service Type: SUR Location: 2000 2040 01 Attending Physician:  Mikey Bussing Dictated by:   Mikey Bussing, M.D. Proc. Date: 11/06/01 Admit Date:  11/06/2001   CC:         CVTS office  [facility], Attention:  Dr. Peter Swaziland   Operative Report  PREOPERATIVE DIAGNOSIS:  Class 4 progressive angina with severe three vessel coronary artery disease and reduced left ventricular function (ejection fraction 30%).  POSTOPERATIVE DIAGNOSIS:  Class 4 progressive angina with severe three vessel coronary artery disease and reduced left ventricular function (ejection fraction 30%).  OPERATION:  Coronary artery bypass grafting x3 (left internal mammary artery to LAD, left radial artery graft to ramus intermediate, saphenous vein graft to posterior descending).  SURGEON:  Mikey Bussing, M.D.  ASSISTANT:  Dominica Severin, P.A.-C.  ANESTHESIA:  General by J. Claybon Jabs, M.D.  INDICATIONS:  The patient is a 70 year old male who presents with a history of progressive angina.  Cardiac catheterization by Dr. Peter Swaziland demonstrated occlusion of the LAD, and high grade stenosis of the nondominant circumflex, and right coronary artery critical disease.  The ejection fraction was reduced with the prior inferior wall MI, and there is questionable mild to moderate mitral regurgitation.  He is referred for surgical coronary revascularization.  Prior to the operation, I examined the patient in the office, and reviewed the results of the cardiac cath with the patient and his wife.  The patient was treated for an episode of bronchitis prior to surgery which cleared before scheduling this operation.  While the patient was examined in the office, I reviewed the indications and expected benefits of coronary artery bypass grafting for treatment  of his coronary artery disease.  I discussed the major aspects of the proposed operation including the choice of conduits for grafting including the use of the left radial artery, the location of the surgical incisions, use of general anesthesia and cardiopulmonary bypass, and the expected postoperative hospital recovery.  I reviewed with the patient the alternatives to surgical therapy for treatment of his coronary artery disease. I also discussed with the patient and wife the risks associated with this operation to the patient including the risks of MI, CVA, bleeding, infection, and death.  They understood these implications for the surgery and agreed to proceed with the operation as planned under what I felt was an informed consent.  OPERATIVE FINDINGS:  The saphenous vein was taken from the left lower leg and it was small, but adequate.  The left radial artery was harvested after documenting an intact left palmar arch.  The radial artery was a normal vessel.  The left internal mammary artery was a small vessel for the patients body size (BFA 2.4), but had excellent flow.  The coronaries were severely diseased.  The circumflex distally was not graftable.  The distal posterolateral branch of the right coronary were also atretic and non-graftable.  The patient had a TEE in the operating room which showed no significant mitral regurgitation.  DESCRIPTION OF PROCEDURE:  The patient was brought to the operating room and placed supine on the operating table where general anesthesia was induced under invasive hemodynamic monitoring.  The chest, abdomen, and legs were prepped with Betadine, and draped as a sterile field.  The left arm was initially prepped and draped as a  separate field, and the left radial artery was harvested as a free graft, flushed with heparin and papaverine, and stored in a balanced electrolyte solution for later use.  Prior to removing the radial artery, the palmar  arch circulation was examined and found to be intact with a good pulse in the hand after clamping the radial artery.  A sternal incision was then made as the saphenous vein was harvested from the left lower leg.  The internal mammary artery was harvested as a pedicle graft from its origin at the subclavian vessels.  A sternal retractor was placed. Heparin was administered and the ACT was documented as being therapeutic. Three pursestrings were placed in the ascending aorta and right atrium.  The patient was cannulated and placed on bypass and cooled to 32 degrees.  The coronaries were identified for grafting, and the mammary artery and vein grafts were prepared for the distal anastomoses.  The radial artery was prepared with a separate cannula.  The cardioplegia cannulas were placed for both antegrade and retrograde delivery of cold blood cardioplegia, and the ascending aorta and the right atrium into the coronary sinus.  The patient was cooled to 30 degrees.  As the aortic cross-clamp was applied, a total of 800 cc of cold blood cardioplegia was delivered in split doses between the antegrade and retrograde catheters.  There was good cardioplegic arrest with the septal temperature dropping less than 15 degrees.  Topical ice saline slush was used to augment myocardial preservation, and a pericardial insufflator pad was used to protect the left phrenic nerve.  The distal coronary anastomoses were then performed.  The first distal anastomosis was to the posterior descending.  This was a 1.5 mm vessel with proximal 80% stenosis.  A reversed saphenous vein was sewn end-to-side with a running 7-0 Prolene with good flow through the graft.  The second distal anastomosis was to the ramus intermediate which was in the position of a high diagonal.  The left radial artery free graft was sewn in an end-to-side fashion.  It was a 1.5 mm vessel which had a proximal 95%  stenosis.  A running 8-0 was  used and there was excellent flow through the graft.  Cardioplegia was redosed.  The third distal anastomosis was to the distal third of the LAD which was totally occluded proximally.  The left internal mammary artery pedicle was brought through an opening created in the left lateral pericardium.  It was brought down on to the LAD and sewn end-to-side with a running 8-0 Prolene. There was excellent flow through the anastomosis with immediate rise in septal temperature after release of the pedicle clamp on the mammary artery.  The mammary and pedicle were secured to the epicardium and the aortic cross-clamp was removed.  The heart resumed a spontaneous rhythm.  A partial occlusion clamp was placed on the ascending aorta and two proximal anastomoses were performed using 7-0 Prolene to attach the radial artery to the aorta and running 6-0 Prolene to attach the vein graft.  The partial clamp was removed and the grafts were perfused.  Each had excellent flow and hemostasis was documented in the proximal and distal sites.  The patient was rewarmed and reperfused. Temporary pacing wires were applied.  The lungs reexpanded and the ventilator was resumed.  The patient was weaned from bypass on low dose dopamine with stable blood pressure and cardiac output.  Protamine was administered and there was no adverse reaction to the Protamine.  A transesophageal echo  exam showed no significant mitral regurgitation with some improvement in global left ventricular function.  The cannulas were removed and the mediastinum was irrigated with warm antibiotic irrigation.  The leg incision was irrigated and closed in the standard fashion.  The pericardium was loosely reapproximated.  Two mediastinal and a left pleural chest tube were placed and brought out through separate incisions.  The sternum was reapproximated with interrupted steel wire.  The pectoralis fascia was closed with interrupted Vicryl.   The subcutaneous and skin were closed with a running Vicryl and sterile dressings were applied.  Total bypass time was 140 minutes with aortic cross-clamp time of 50 minutes. Dictated by:   Mikey Bussing, M.D. Attending Physician:  Mikey Bussing DD:  11/06/01 TD:  11/08/01 Job: [id number]

## 2010-10-14 NOTE — Cardiovascular Report (Signed)
[place]. [facility]  Patient:    Jason Choi, Jason Choi Visit Number: [id number] MRN: [id number]          Service Type: CAT Attending Physician:  [place], Peter M Md Dictated by:   Peter M. [place], M.D. Proc. Date: 10/11/01 Admit Date:  10/11/2001 Discharge Date: 10/11/2001   CC:         Delorse Lek, M.D.  Alleen Borne, M.D.                        Cardiac Catheterization  INDICATIONS FOR PROCEDURE: The patient is a 70 year old white male who presented with congestive heart failure. Previous echocardiogram showed severe left ventricular dysfunction with an ejection fractionof 25-30%. After initiation of medical therapy, the patient underwent stress Cardiolite study which showed multiple perfusion abnormalities.  ACCESS: Via the right femoral artery using the standard Seldinger technique.  EQUIPMENT: The 6 French 4 cm right and left Judkins catheter, 6 French pigtail catheter, 6 French arterial sheath.  MEDICATIONS: Local anesthesia with 1% Xylocaine.  CONTRAST: Omnipaque 150 cc.  HEMODYNAMIC DATA: Aortic pressure was 123/81 with a mean of 100.  Left ventricular pressure is 117 with an EDP of 21 mmHg.  ANGIOGRAPHIC DATA: The left coronary artery arises and distributes normally. The left main coronary artery has moderate calcification with 20% narrowing in the distal left main.  The left anterior descending artery is also calcified proximally. It has diffuse disease in the proximal vessel with ashelflike stenosis of 70% prior to the takeoff of the first diagonal. The LAD is occluded after a small second diagonal branch and after the first septal perforator. The distal LAD fills by faint left to left collaterals.  The first diagonal branch is a large branch which has a 90% stenosis proximally. The second diagonal branch is a very small and diffuselydisease vessel with diffuse 90% stenoses.  The left circumflex coronary artery gives rise  to a single marginal branch. This branch then trifurcates into three smaller vessels. The initial two branches are verysmall. The first branch has a 90% stenosis. The second branch is occluded. The third branch of the marginal system is a moderate sized vessel andhas a 80-90% stenosis proximally.  The right coronary artery is a large dominant vessel. It has 30% narrowings in the proximal and mid vessel. The PDA has a 70% stenosis proximally.  The right coronary is then occluded following the PDA. The first and second posterolateral branches fillby left to right collaterals.  LEFT VENTRICULAR ANGIOGRAPHY: Left ventricular angiography demonstrates moderate left ventricular enlargement.There is severe global hypokinesia with akinesia of the apex. Ejection fraction is estimated at 30%. There is mild to moderate mitral insufficiency.  FINAL INTERPRETATION: 1. Severe three-vessel obstructive atherosclerotic coronary artery disease. 2. Moderate to severe left ventricular dysfunction. Dictated by:   Peter M. [place], M.D. Attending Physician:  [place], Peter M Md DD:  10/11/01 TD:  10/14/01 Job: [id number]

## 2010-10-18 ENCOUNTER — Telehealth: Payer: Self-pay | Admitting: *Deleted

## 2010-10-18 ENCOUNTER — Ambulatory Visit (INDEPENDENT_AMBULATORY_CARE_PROVIDER_SITE_OTHER): Payer: Medicare Other | Admitting: *Deleted

## 2010-10-18 ENCOUNTER — Encounter: Payer: Self-pay | Admitting: Nurse Practitioner

## 2010-10-18 ENCOUNTER — Ambulatory Visit (INDEPENDENT_AMBULATORY_CARE_PROVIDER_SITE_OTHER): Payer: Medicare Other | Admitting: Nurse Practitioner

## 2010-10-18 DIAGNOSIS — R0600 Dyspnea, unspecified: Secondary | ICD-10-CM

## 2010-10-18 DIAGNOSIS — I10 Essential (primary) hypertension: Secondary | ICD-10-CM

## 2010-10-18 DIAGNOSIS — Z7901 Long term (current) use of anticoagulants: Secondary | ICD-10-CM

## 2010-10-18 DIAGNOSIS — I519 Heart disease, unspecified: Secondary | ICD-10-CM

## 2010-10-18 DIAGNOSIS — I5020 Unspecified systolic (congestive) heart failure: Secondary | ICD-10-CM

## 2010-10-18 DIAGNOSIS — I4891 Unspecified atrial fibrillation: Secondary | ICD-10-CM

## 2010-10-18 DIAGNOSIS — R0609 Other forms of dyspnea: Secondary | ICD-10-CM

## 2010-10-18 DIAGNOSIS — I5022 Chronic systolic (congestive) heart failure: Secondary | ICD-10-CM

## 2010-10-18 LAB — BASIC METABOLIC PANEL
BUN: 51 mg/dL — ABNORMAL HIGH (ref 6–23)
CO2: 21 mEq/L (ref 19–32)
Calcium: 9.5 mg/dL (ref 8.4–10.5)
Chloride: 108 mEq/L (ref 96–112)
Creatinine, Ser: 2.3 mg/dL — ABNORMAL HIGH (ref 0.4–1.5)
GFR: 30.51 mL/min — ABNORMAL LOW (ref >60.00)
Glucose, Bld: 115 mg/dL — ABNORMAL HIGH (ref 70–99)
Potassium: 5.1 mEq/L (ref 3.5–5.1)
Sodium: 141 mEq/L (ref 135–145)

## 2010-10-18 LAB — POCT INR: INR: 2.1

## 2010-10-18 LAB — BRAIN NATRIURETIC PEPTIDE: Pro B Natriuretic peptide (BNP): 108 pg/mL — ABNORMAL HIGH (ref 0.0–100.0)

## 2010-10-18 NOTE — Assessment & Plan Note (Signed)
His grafts were patent at the time of cath. He is now on Aldactone. He is very well compensated at this time. He appears to be NYHA Class I to II at this time. We will check his labs today. May need to give consideration for a repeat echo in a couple of months after being on more CHF regimen and reassess his EF. Patient is agreeable to this plan and will call if any problems develop in the interim.

## 2010-10-18 NOTE — Progress Notes (Signed)
Jason Choi Date of Birth: 10-28-1940   History of Present Illness: Jason Choi is seen back today for a post hospital visit. He is seen for Dr. [place]. He has had a cardiac cath that showed his grafts to be patent. EF was 25 to 30%. His echo however, showed an EF of 35 to 40%. Plans for ICD are now on hold. He has been started on Aldactone. He is feeling so much better. He is not short of breath. His cough is gone. His weight is down considerably. He is very pleased with how he currently feels. He has had no issues with his groin. He is tolerating his medicines.   Current Outpatient Prescriptions on File Prior to Visit  Medication Sig Dispense Refill  . amiodarone (PACERONE) 200 MG tablet Take 200 mg by mouth daily.        Marland Kitchen aspirin 81 MG tablet Take 81 mg by mouth daily.        Marland Kitchen atorvastatin (LIPITOR) 20 MG tablet Take 40 mg by mouth daily.       . carvedilol (COREG) 25 MG tablet Take 25 mg by mouth 2 (two) times daily with a meal.        . furosemide (LASIX) 40 MG tablet Take 1 tablet (40 mg total) by mouth 2 (two) times daily.  180 tablet  3  . Multiple Vitamin (MULTIVITAMIN) tablet Take 1 tablet by mouth daily.        . potassium chloride SA (K-DUR,KLOR-CON) 20 MEQ tablet Take 20 mEq by mouth daily.        . ramipril (ALTACE) 2.5 MG capsule Take 10 mg by mouth daily.       Marland Kitchen spironolactone (ALDACTONE) 25 MG tablet Take 1 tablet (25 mg total) by mouth daily.  30 tablet  5  . Warfarin Sodium (COUMADIN PO) Take 5 mg by mouth daily. As directed.         Allergies  Allergen Reactions  . Novocain     Past Medical History  Diagnosis Date  . CAD (coronary artery disease)   . S/P CABG (coronary artery bypass graft) 2002    SVG to PDA, Free radial to Intermediate, LIMA to LAD  . HTN (hypertension)   . Hypercholesteremia   . LV dysfunction     EF 35 to 40%  . Atrial fibrillation   . Chronic anticoagulation   . Polio   . High risk medication use     on amiodarone    Past  Surgical History  Procedure Date  . Coronary artery bypass graft   . Cardiac catheterization 2009    Grafts patent  . Right leg surgeries     due to polio  . Left knee arthroscopy     History  Smoking status  . Former Smoker  Smokeless tobacco  . Not on file  Comment: stopped smoking over 30 years ago.    History  Alcohol Use  . Yes    occasional beer    Family History  Problem Relation Age of Onset  . Valvular heart disease Father     Review of Systems: The review of systems is as above.  All other systems were reviewed and are negative.  Physical Exam: BP 112/60  Pulse 60  Ht 6\' 4"  (1.93 m)  Wt 245 lb (111.131 kg)  BMI 29.82 kg/m2 Patient is very pleasant and in no acute distress. He is obese and has a large frame.  Skin is warm and  dry. Color is normal.  HEENT is unremarkable. Normocephalic/atraumatic. PERRL. Sclera are nonicteric. Neck is supple. No masses. No JVD. Lungs are clear. Cardiac exam shows a regular rate and rhythm. Abdomen is soft. Extremities are without edema. Right leg is atrophied. Gait and ROM are intact. No gross neurologic deficits noted.  LABORATORY DATA:  Pending   Assessment / Plan:

## 2010-10-18 NOTE — Telephone Encounter (Signed)
Notified of lab results. Will repeat bmet in 2 wks. Made him an app

## 2010-10-18 NOTE — Telephone Encounter (Signed)
Message copied by Murrell Redden on Tue Oct 18, 2010  4:46 PM ------      Message from: Norma Fredrickson      Created: Tue Oct 18, 2010  3:59 PM       BNP is improved. Ok to stop the potassium. Needs to decrease the Lasix to just 40 mg daily. Recheck BMET in one week. Continue to monitor weights.

## 2010-10-18 NOTE — Assessment & Plan Note (Signed)
Will check an INR as well today.

## 2010-10-18 NOTE — Assessment & Plan Note (Signed)
Will continue with his current regimen.

## 2010-10-18 NOTE — Patient Instructions (Signed)
Continue with your current medicines. Weigh yourself each morning and record. Take extra dose of diuretic for weight gain of 3 pounds in 24 hours. Limit sodium intake.  We are going to check your labwork today. I will see you back in about 6 weeks.

## 2010-10-25 ENCOUNTER — Ambulatory Visit: Payer: Medicare Other | Admitting: Internal Medicine

## 2010-11-01 ENCOUNTER — Other Ambulatory Visit: Payer: Medicare Other | Admitting: *Deleted

## 2010-11-17 ENCOUNTER — Ambulatory Visit (INDEPENDENT_AMBULATORY_CARE_PROVIDER_SITE_OTHER): Payer: Medicare Other | Admitting: *Deleted

## 2010-11-17 DIAGNOSIS — I2510 Atherosclerotic heart disease of native coronary artery without angina pectoris: Secondary | ICD-10-CM

## 2010-12-05 ENCOUNTER — Ambulatory Visit (INDEPENDENT_AMBULATORY_CARE_PROVIDER_SITE_OTHER): Payer: Medicare Other | Admitting: *Deleted

## 2010-12-05 ENCOUNTER — Ambulatory Visit (INDEPENDENT_AMBULATORY_CARE_PROVIDER_SITE_OTHER): Payer: Medicare Other | Admitting: Nurse Practitioner

## 2010-12-05 ENCOUNTER — Encounter: Payer: Self-pay | Admitting: Nurse Practitioner

## 2010-12-05 ENCOUNTER — Encounter: Payer: Medicare Other | Admitting: *Deleted

## 2010-12-05 VITALS — BP 94/50 | HR 56 | Wt 251.3 lb

## 2010-12-05 DIAGNOSIS — I509 Heart failure, unspecified: Secondary | ICD-10-CM

## 2010-12-05 DIAGNOSIS — I519 Heart disease, unspecified: Secondary | ICD-10-CM

## 2010-12-05 DIAGNOSIS — I4891 Unspecified atrial fibrillation: Secondary | ICD-10-CM

## 2010-12-05 LAB — BASIC METABOLIC PANEL
BUN: 38 mg/dL — ABNORMAL HIGH (ref 6–23)
CO2: 23 mEq/L (ref 19–32)
Calcium: 8.8 mg/dL (ref 8.4–10.5)
Chloride: 104 mEq/L (ref 96–112)
Creatinine, Ser: 1.6 mg/dL — ABNORMAL HIGH (ref 0.4–1.5)
GFR: 45.57 mL/min — ABNORMAL LOW (ref >60.00)
Glucose, Bld: 178 mg/dL — ABNORMAL HIGH (ref 70–99)
Potassium: 4.2 mEq/L (ref 3.5–5.1)
Sodium: 136 mEq/L (ref 135–145)

## 2010-12-05 LAB — POCT INR: INR: 2.3

## 2010-12-05 NOTE — Progress Notes (Signed)
Jason Choi Date of Birth: [birth year]   History of Present Illness: Jason Choi is seen back today for a 6 week visit. He is seen for Dr. [place]. He continues to do well. He is not having chest pain or shortness of breath. His weight has been stable at home. He feels good on his current regimen. He is rarely dizzy.   Current Outpatient Prescriptions on File Prior to Visit  Medication Sig Dispense Refill  . amiodarone (PACERONE) 200 MG tablet Take 200 mg by mouth daily.        Marland Kitchen aspirin 81 MG tablet Take 81 mg by mouth daily.        Marland Kitchen atorvastatin (LIPITOR) 20 MG tablet Take 40 mg by mouth daily.       . carvedilol (COREG) 25 MG tablet Take 25 mg by mouth 2 (two) times daily with a meal.        . Multiple Vitamin (MULTIVITAMIN) tablet Take 1 tablet by mouth daily.        . potassium chloride SA (K-DUR,KLOR-CON) 20 MEQ tablet Take 20 mEq by mouth daily.        . ramipril (ALTACE) 2.5 MG capsule Take 10 mg by mouth daily.       Marland Kitchen spironolactone (ALDACTONE) 25 MG tablet Take 1 tablet (25 mg total) by mouth daily.  30 tablet  5  . Warfarin Sodium (COUMADIN PO) Take 5 mg by mouth daily. As directed.       Marland Kitchen DISCONTD: furosemide (LASIX) 40 MG tablet Take 1 tablet (40 mg total) by mouth 2 (two) times daily.  180 tablet  3    Allergies  Allergen Reactions  . Novocain     Past Medical History  Diagnosis Date  . S/P CABG (coronary artery bypass graft) 2002    SVG to PDA, Free radial to Intermediate, LIMA to LAD  . HTN (hypertension)   . Hypercholesteremia   . LV dysfunction     EF 35 to 40% per echo May 2012  . Atrial fibrillation     on amiodarone  . Chronic anticoagulation   . Polio   . High risk medication use     on amiodarone    Past Surgical History  Procedure Date  . Coronary artery bypass graft 2002  . Cardiac catheterization 2009    Grafts patent  . Right leg surgeries     due to polio  . Left knee arthroscopy   . Cardiac catheterization 2012    Grafts patent. EF  was 25 to 30% but 35 to 40 by echo    History  Smoking status  . Former Smoker  Smokeless tobacco  . Not on file  Comment: stopped smoking over 30 years ago.    History  Alcohol Use  . Yes    occasional beer    Family History  Problem Relation Age of Onset  . Valvular heart disease Father     Review of Systems: The review of systems is as above.  All other systems were reviewed and are negative.  Physical Exam: BP 94/50  Pulse 56  Wt 251 lb 5 oz (113.995 kg) Patient is very pleasant and in no acute distress. He is rather large. Skin is warm and dry. Color is normal.  HEENT is unremarkable. Normocephalic/atraumatic. PERRL. Sclera are nonicteric. Neck is supple. No masses. No JVD. Lungs are clear. Cardiac exam shows a regular rate and rhythm today. Abdomen is obese and soft. Extremities are without  edema. Gait and ROM are intact. No gross neurologic deficits noted.  LABORATORY DATA:  BMET is pending  Assessment / Plan:

## 2010-12-05 NOTE — Assessment & Plan Note (Signed)
His EF was 35 to 40% by echo. Plans for ICD have been put on hold. He is now on Aldactone and on a good heart failure regimen. I will have him see Dr. [place]iland in about 3 months. Will consider repeat echo on return. BMET is checked today to check on potassium levels. He is to continue with daily weights and his current regimen. Patient is agreeable to this plan and will call if any problems develop in the interim.

## 2010-12-05 NOTE — Patient Instructions (Signed)
Continue with your current medicines. Weigh yourself each morning and record. Take extra dose of diuretic for weight gain of 3 pounds in 24 hours. Limit sodium intake.  We will see you back in about 3 months. We will consider repeating your ultrasound on return We will check your potassium level

## 2010-12-06 ENCOUNTER — Telehealth: Payer: Self-pay | Admitting: *Deleted

## 2010-12-06 NOTE — Telephone Encounter (Signed)
Message copied by Lorayne Bender on Tue Dec 06, 2010 11:16 AM ------      Message from: Rosalio Macadamia      Created: Mon Dec 05, 2010  4:40 PM       Ok to report. Labs are satisfactory. Potassium is ok.

## 2010-12-06 NOTE — Telephone Encounter (Signed)
Notified of lab results. Will send copy to Dr. Burnett 

## 2011-01-02 ENCOUNTER — Encounter: Payer: Medicare Other | Admitting: *Deleted

## 2011-01-02 ENCOUNTER — Ambulatory Visit (INDEPENDENT_AMBULATORY_CARE_PROVIDER_SITE_OTHER): Payer: Medicare Other | Admitting: *Deleted

## 2011-01-02 DIAGNOSIS — I4891 Unspecified atrial fibrillation: Secondary | ICD-10-CM

## 2011-01-21 ENCOUNTER — Emergency Department (HOSPITAL_COMMUNITY): Payer: Medicare Other

## 2011-01-21 ENCOUNTER — Emergency Department (HOSPITAL_COMMUNITY)
Admission: EM | Admit: 2011-01-21 | Discharge: 2011-01-21 | Disposition: A | Discharge status: Home / Self Care | Payer: Medicare Other | Attending: Emergency Medicine | Admitting: Emergency Medicine

## 2011-01-21 DIAGNOSIS — W010XXA Fall on same level from slipping, tripping and stumbling without subsequent striking against object, initial encounter: Secondary | ICD-10-CM | POA: Insufficient documentation

## 2011-01-21 DIAGNOSIS — Y92009 Unspecified place in unspecified non-institutional (private) residence as the place of occurrence of the external cause: Secondary | ICD-10-CM | POA: Insufficient documentation

## 2011-01-21 DIAGNOSIS — IMO0002 Reserved for concepts with insufficient information to code with codable children: Secondary | ICD-10-CM | POA: Insufficient documentation

## 2011-01-21 DIAGNOSIS — S7000XA Contusion of unspecified hip, initial encounter: Secondary | ICD-10-CM | POA: Insufficient documentation

## 2011-01-21 DIAGNOSIS — I509 Heart failure, unspecified: Secondary | ICD-10-CM | POA: Insufficient documentation

## 2011-01-21 DIAGNOSIS — I25810 Atherosclerosis of coronary artery bypass graft(s) without angina pectoris: Secondary | ICD-10-CM | POA: Insufficient documentation

## 2011-01-21 DIAGNOSIS — Z7901 Long term (current) use of anticoagulants: Secondary | ICD-10-CM | POA: Insufficient documentation

## 2011-02-06 ENCOUNTER — Ambulatory Visit (INDEPENDENT_AMBULATORY_CARE_PROVIDER_SITE_OTHER): Payer: Medicare Other | Admitting: *Deleted

## 2011-02-06 DIAGNOSIS — I4891 Unspecified atrial fibrillation: Secondary | ICD-10-CM

## 2011-02-23 ENCOUNTER — Telehealth: Payer: Self-pay | Admitting: Cardiology

## 2011-02-23 NOTE — Telephone Encounter (Signed)
Pt went to [facility] today, checked bp reading was 80/50, pls advise

## 2011-02-23 NOTE — Telephone Encounter (Signed)
Called stating BP at Orthopedic dr's office this AM was 80/50. Last week at Dr. Mellody Life office was 110/50. When he has taken BP has been 115/58 over the last couple of weeks; today at home was 82/52. Feels lightheaded off and on and is stumbling occ. Spoke w/Dr. Elease Hashimoto (DOD) who suggested he stop Lasix for a couple of days and KCL. States he stopped KCL couple of months ago. Advised to call 1st of week to let us know how his BP is doing; will speak w/Dr. [place] tomorrow to see if he wants to do anything different.

## 2011-03-02 ENCOUNTER — Other Ambulatory Visit: Payer: Self-pay | Admitting: *Deleted

## 2011-03-02 ENCOUNTER — Telehealth: Payer: Self-pay | Admitting: *Deleted

## 2011-03-02 MED ORDER — SPIRONOLACTONE 25 MG PO TABS: 25.0000 mg | ORAL_TABLET | Freq: Every day | ORAL | Status: DC

## 2011-03-02 NOTE — Telephone Encounter (Signed)
Called to check on Jason Choi. States his BP is still low-averaging 80/50; over week-end was 100/; still having some lightheadedness. Has been off Lasix since Thurs but restarted yest-Mon. Per Dr. [place] advised to decrease Coreg to 12.5 mg BID. Continue Lasix, Ramipril, and Spironolactone. Advised to continue to monitor BP and call us next week if still continues to be low and is lightheaded.Marland Kitchen

## 2011-03-06 ENCOUNTER — Ambulatory Visit (INDEPENDENT_AMBULATORY_CARE_PROVIDER_SITE_OTHER): Payer: Medicare Other | Admitting: *Deleted

## 2011-03-06 DIAGNOSIS — I4891 Unspecified atrial fibrillation: Secondary | ICD-10-CM

## 2011-03-06 LAB — POCT INR: INR: 1.5

## 2011-03-20 ENCOUNTER — Telehealth: Payer: Self-pay | Admitting: Cardiology

## 2011-03-20 NOTE — Telephone Encounter (Signed)
Pt requesting call re status of surgical clearance form

## 2011-03-20 NOTE — Telephone Encounter (Signed)
Called wanting to know of status of clearance form for him for rotator cuff surgery w/Dr. Thomasena Edis. States he hand delivered it and gave to [place]. After searching the office found in medical records. LM for pt to see if he still wants [place] to sign. He had stated he is going to get cortisone injections and PT.

## 2011-03-23 ENCOUNTER — Ambulatory Visit (INDEPENDENT_AMBULATORY_CARE_PROVIDER_SITE_OTHER): Payer: Medicare Other | Admitting: *Deleted

## 2011-03-23 ENCOUNTER — Encounter: Payer: Medicare Other | Admitting: *Deleted

## 2011-03-23 DIAGNOSIS — I4891 Unspecified atrial fibrillation: Secondary | ICD-10-CM

## 2011-04-10 ENCOUNTER — Ambulatory Visit (INDEPENDENT_AMBULATORY_CARE_PROVIDER_SITE_OTHER): Payer: Medicare Other | Admitting: Cardiology

## 2011-04-10 ENCOUNTER — Ambulatory Visit (INDEPENDENT_AMBULATORY_CARE_PROVIDER_SITE_OTHER): Payer: Medicare Other | Admitting: *Deleted

## 2011-04-10 ENCOUNTER — Encounter: Payer: Self-pay | Admitting: Cardiology

## 2011-04-10 VITALS — BP 143/85 | HR 60 | Ht 75.0 in | Wt 243.4 lb

## 2011-04-10 DIAGNOSIS — I4891 Unspecified atrial fibrillation: Secondary | ICD-10-CM

## 2011-04-10 DIAGNOSIS — I2510 Atherosclerotic heart disease of native coronary artery without angina pectoris: Secondary | ICD-10-CM

## 2011-04-10 DIAGNOSIS — I509 Heart failure, unspecified: Secondary | ICD-10-CM

## 2011-04-10 DIAGNOSIS — I5022 Chronic systolic (congestive) heart failure: Secondary | ICD-10-CM

## 2011-04-10 DIAGNOSIS — Z7901 Long term (current) use of anticoagulants: Secondary | ICD-10-CM

## 2011-04-10 NOTE — Assessment & Plan Note (Signed)
He is status post CABG in 2002. Cardiac catheterization in 2009 showed continued patency of all his grafts. We will continue with risk factor modification.

## 2011-04-10 NOTE — Assessment & Plan Note (Signed)
INR is therapeutic today. 

## 2011-04-10 NOTE — Progress Notes (Signed)
Jason Choi Date of Birth: [birth year]   History of Present Illness: Jason Choi is seen back today for a followup visit. He suffered a fall and tore his rotator cuff. He is being considered for shoulder surgery. He has tried an injection and is currently doing physical therapy. Since his last visit he has lost 8 pounds. He was experiencing significant orthostatic hypotension and dizziness. This resolved with reduction in his carvedilol dose. He denies any increase in edema or shortness of breath. He's had no chest pain or palpitations.  Current Outpatient Prescriptions on File Prior to Visit  Medication Sig Dispense Refill  . amiodarone (PACERONE) 200 MG tablet Take 200 mg by mouth daily.        Marland Kitchen aspirin 81 MG tablet Take 81 mg by mouth daily.        Marland Kitchen atorvastatin (LIPITOR) 20 MG tablet Take 40 mg by mouth daily.       . carvedilol (COREG) 25 MG tablet Take 25 mg by mouth 2 (two) times daily with a meal.        . furosemide (LASIX) 40 MG tablet Take 40 mg by mouth daily.        . Multiple Vitamin (MULTIVITAMIN) tablet Take 1 tablet by mouth daily.        . ramipril (ALTACE) 10 MG capsule Take 10 mg by mouth daily.        Marland Kitchen spironolactone (ALDACTONE) 25 MG tablet Take 1 tablet (25 mg total) by mouth daily.  30 tablet  5  . Warfarin Sodium (COUMADIN PO) Take 5 mg by mouth daily. As directed.         Allergies  Allergen Reactions  . Novocain     Past Medical History  Diagnosis Date  . S/P CABG (coronary artery bypass graft) 2002    SVG to PDA, Free radial to Intermediate, LIMA to LAD  . HTN (hypertension)   . Hypercholesteremia   . LV dysfunction     EF 35 to 40% per echo May 2012  . Atrial fibrillation     on amiodarone  . Chronic anticoagulation   . Polio   . High risk medication use     on amiodarone    Past Surgical History  Procedure Date  . Coronary artery bypass graft 2002  . Cardiac catheterization 2009    Grafts patent  . Right leg surgeries     due to polio    . Left knee arthroscopy   . Cardiac catheterization 2012    Grafts patent. EF was 25 to 30% but 35 to 40 by echo    History  Smoking status  . Former Smoker  Smokeless tobacco  . Not on file  Comment: stopped smoking over 30 years ago.    History  Alcohol Use  . Yes    occasional beer    Family History  Problem Relation Age of Onset  . Valvular heart disease Father     Review of Systems: The review of systems is as above.  All other systems were reviewed and are negative.  Physical Exam: BP 143/85  Pulse 60  Ht 6\' 3"  (1.905 m)  Wt 110.406 kg (243 lb 6.4 oz)  BMI 30.42 kg/m2 Patient is very pleasant and in no acute distress. He is rather large. Skin is warm and dry. Color is normal.  HEENT is unremarkable. Normocephalic/atraumatic. PERRL. Sclera are nonicteric. Neck is supple. No masses. No JVD. Lungs are clear. Cardiac exam shows a  regular rate and rhythm today. Abdomen is obese and soft. Extremities are without edema. Gait and ROM are intact. No gross neurologic deficits noted.  LABORATORY DATA:  BMET is pending  Assessment / Plan:

## 2011-04-10 NOTE — Assessment & Plan Note (Signed)
Patient is well compensated on optimal medical therapy. We will repeat a basic metabolic panel since his potassium was discontinued and he is now on Aldactone. If need be he will be cleared for his planned shoulder surgery. He will need to stop his Coumadin 5 days before.

## 2011-04-10 NOTE — Patient Instructions (Signed)
We will check a basic metabolic panel today to check your kidneys and potassium.  Continue your other medications.  You are cleared for shoulder surgery. Stop coumadin 5 days before.

## 2011-04-10 NOTE — Assessment & Plan Note (Signed)
This appears to be well controlled on amiodarone. He is on chronic Coumadin therapy. We will continue with his current therapy.

## 2011-04-11 LAB — BASIC METABOLIC PANEL
BUN: 46 mg/dL — ABNORMAL HIGH (ref 6–23)
CO2: 25 mEq/L (ref 19–32)
Chloride: 104 mEq/L (ref 96–112)
Glucose, Bld: 94 mg/dL (ref 70–99)
Potassium: 5.2 mEq/L — ABNORMAL HIGH (ref 3.5–5.1)

## 2011-05-05 ENCOUNTER — Other Ambulatory Visit: Payer: Self-pay | Admitting: Cardiology

## 2011-05-09 ENCOUNTER — Encounter: Payer: Medicare Other | Admitting: *Deleted

## 2011-05-09 ENCOUNTER — Other Ambulatory Visit: Payer: Medicare Other | Admitting: *Deleted

## 2011-05-10 ENCOUNTER — Ambulatory Visit (INDEPENDENT_AMBULATORY_CARE_PROVIDER_SITE_OTHER): Payer: Medicare Other | Admitting: *Deleted

## 2011-05-10 ENCOUNTER — Other Ambulatory Visit: Payer: Medicare Other | Admitting: *Deleted

## 2011-05-10 ENCOUNTER — Other Ambulatory Visit: Payer: Self-pay | Admitting: Pain Medicine

## 2011-05-10 DIAGNOSIS — I5022 Chronic systolic (congestive) heart failure: Secondary | ICD-10-CM

## 2011-05-10 DIAGNOSIS — I4891 Unspecified atrial fibrillation: Secondary | ICD-10-CM

## 2011-05-12 ENCOUNTER — Encounter (HOSPITAL_BASED_OUTPATIENT_CLINIC_OR_DEPARTMENT_OTHER): Payer: Self-pay | Admitting: *Deleted

## 2011-05-12 NOTE — Progress Notes (Signed)
NPO AFTER MN. PT ARRIVES AT 1030. NEEDS HG, PT/INR, ?BMET ( ON DONE 05-10-11 IN EPIC PENDING RESULTS).  CURRENT EKG 09-27-10 W/ CHART.  CURRENT CXR 09-29-10 W/ CHART AND EPIC.  CLEARANCE NOTE FROM DR [place] W/ CHART. WILL TAKE COREG, LIPITOR, AND AMIODORONE AM OF SURG. W/ SIP OF WATER. REVIEWED [facility] GUIDELINES, WILL BRING MEDS.

## 2011-05-15 ENCOUNTER — Other Ambulatory Visit: Payer: Self-pay | Admitting: Pain Medicine

## 2011-05-16 ENCOUNTER — Encounter (HOSPITAL_BASED_OUTPATIENT_CLINIC_OR_DEPARTMENT_OTHER): Payer: Self-pay | Admitting: Anesthesiology

## 2011-05-16 ENCOUNTER — Encounter (HOSPITAL_BASED_OUTPATIENT_CLINIC_OR_DEPARTMENT_OTHER)
Admission: RE | Disposition: A | Discharge status: Home / Self Care | Payer: Self-pay | Source: Ambulatory Visit | Attending: Specialist

## 2011-05-16 ENCOUNTER — Ambulatory Visit (HOSPITAL_BASED_OUTPATIENT_CLINIC_OR_DEPARTMENT_OTHER)
Admission: RE | Admit: 2011-05-16 | Discharge: 2011-05-17 | Disposition: A | Discharge status: Home / Self Care | Payer: Medicare Other | Source: Ambulatory Visit | Attending: Specialist | Admitting: Specialist

## 2011-05-16 ENCOUNTER — Ambulatory Visit (HOSPITAL_BASED_OUTPATIENT_CLINIC_OR_DEPARTMENT_OTHER): Payer: Medicare Other | Admitting: Anesthesiology

## 2011-05-16 ENCOUNTER — Encounter (HOSPITAL_BASED_OUTPATIENT_CLINIC_OR_DEPARTMENT_OTHER): Payer: Self-pay

## 2011-05-16 DIAGNOSIS — Z8612 Personal history of poliomyelitis: Secondary | ICD-10-CM | POA: Insufficient documentation

## 2011-05-16 DIAGNOSIS — M719 Bursopathy, unspecified: Secondary | ICD-10-CM | POA: Insufficient documentation

## 2011-05-16 DIAGNOSIS — M67919 Unspecified disorder of synovium and tendon, unspecified shoulder: Secondary | ICD-10-CM | POA: Insufficient documentation

## 2011-05-16 DIAGNOSIS — I10 Essential (primary) hypertension: Secondary | ICD-10-CM | POA: Insufficient documentation

## 2011-05-16 DIAGNOSIS — M25519 Pain in unspecified shoulder: Secondary | ICD-10-CM | POA: Insufficient documentation

## 2011-05-16 DIAGNOSIS — Z7901 Long term (current) use of anticoagulants: Secondary | ICD-10-CM | POA: Insufficient documentation

## 2011-05-16 DIAGNOSIS — Z951 Presence of aortocoronary bypass graft: Secondary | ICD-10-CM | POA: Insufficient documentation

## 2011-05-16 DIAGNOSIS — I2789 Other specified pulmonary heart diseases: Secondary | ICD-10-CM | POA: Insufficient documentation

## 2011-05-16 DIAGNOSIS — I2510 Atherosclerotic heart disease of native coronary artery without angina pectoris: Secondary | ICD-10-CM

## 2011-05-16 DIAGNOSIS — I4891 Unspecified atrial fibrillation: Secondary | ICD-10-CM

## 2011-05-16 DIAGNOSIS — S43429A Sprain of unspecified rotator cuff capsule, initial encounter: Secondary | ICD-10-CM | POA: Insufficient documentation

## 2011-05-16 DIAGNOSIS — E7800 Pure hypercholesterolemia, unspecified: Secondary | ICD-10-CM | POA: Insufficient documentation

## 2011-05-16 DIAGNOSIS — IMO0002 Reserved for concepts with insufficient information to code with codable children: Secondary | ICD-10-CM | POA: Insufficient documentation

## 2011-05-16 DIAGNOSIS — M19019 Primary osteoarthritis, unspecified shoulder: Secondary | ICD-10-CM | POA: Insufficient documentation

## 2011-05-16 DIAGNOSIS — X58XXXA Exposure to other specified factors, initial encounter: Secondary | ICD-10-CM | POA: Insufficient documentation

## 2011-05-16 DIAGNOSIS — I5022 Chronic systolic (congestive) heart failure: Secondary | ICD-10-CM

## 2011-05-16 DIAGNOSIS — Z79899 Other long term (current) drug therapy: Secondary | ICD-10-CM | POA: Insufficient documentation

## 2011-05-16 DIAGNOSIS — I509 Heart failure, unspecified: Secondary | ICD-10-CM | POA: Insufficient documentation

## 2011-05-16 DIAGNOSIS — Z7982 Long term (current) use of aspirin: Secondary | ICD-10-CM | POA: Insufficient documentation

## 2011-05-16 HISTORY — DX: Heart failure, unspecified: I50.9

## 2011-05-16 HISTORY — DX: Pulmonary hypertension, unspecified: I27.20

## 2011-05-16 HISTORY — PX: SHOULDER ARTHROSCOPY W/ ROTATOR CUFF REPAIR: SHX2400

## 2011-05-16 HISTORY — DX: Primary osteoarthritis, right shoulder: M19.011

## 2011-05-16 LAB — PROTIME-INR: INR: 1.17 (ref 0.00–1.49)

## 2011-05-16 LAB — POCT I-STAT 4, (NA,K, GLUC, HGB,HCT)
Hemoglobin: 11.9 g/dL — ABNORMAL LOW (ref 13.0–17.0)
Potassium: 4.1 mEq/L (ref 3.5–5.1)
Sodium: 140 mEq/L (ref 135–145)

## 2011-05-16 SURGERY — ARTHROSCOPY, SHOULDER, WITH ROTATOR CUFF REPAIR
Anesthesia: General | Site: Shoulder | Laterality: Right | Wound class: Clean

## 2011-05-16 MED ORDER — FENTANYL CITRATE 0.05 MG/ML IJ SOLN
100.0000 ug | Freq: Once | INTRAMUSCULAR | Status: AC
  Administered 2011-05-16: 100 ug via INTRAVENOUS

## 2011-05-16 MED ORDER — MIDAZOLAM HCL 2 MG/2ML IJ SOLN
2.0000 mg | Freq: Once | INTRAMUSCULAR | Status: AC
  Administered 2011-05-16: 2 mg via INTRAVENOUS

## 2011-05-16 MED ORDER — BUPIVACAINE HCL (PF) 0.25 % IJ SOLN
INTRAMUSCULAR | Status: DC | PRN
  Administered 2011-05-16: 10 mL

## 2011-05-16 MED ORDER — ACETAMINOPHEN 650 MG RE SUPP: 650.0000 mg | Freq: Four times a day (QID) | RECTAL | Status: DC | PRN

## 2011-05-16 MED ORDER — LACTATED RINGERS IV SOLN
INTRAVENOUS | Status: DC
  Administered 2011-05-16 (×3): via INTRAVENOUS

## 2011-05-16 MED ORDER — ONDANSETRON HCL 4 MG PO TABS: 4.0000 mg | ORAL_TABLET | Freq: Four times a day (QID) | ORAL | Status: DC | PRN

## 2011-05-16 MED ORDER — ONDANSETRON HCL 4 MG/2ML IJ SOLN
INTRAMUSCULAR | Status: DC | PRN
  Administered 2011-05-16: 4 mg via INTRAVENOUS

## 2011-05-16 MED ORDER — STERILE WATER FOR IRRIGATION IR SOLN
Status: DC | PRN
  Administered 2011-05-16: 500 mL

## 2011-05-16 MED ORDER — GLYCOPYRROLATE 0.2 MG/ML IJ SOLN
INTRAMUSCULAR | Status: DC | PRN
  Administered 2011-05-16: 0.2 mg via INTRAVENOUS

## 2011-05-16 MED ORDER — CEFAZOLIN SODIUM 1-5 GM-% IV SOLN
1.0000 g | INTRAVENOUS | Status: AC
  Administered 2011-05-16: 2 g via INTRAVENOUS

## 2011-05-16 MED ORDER — CHLORHEXIDINE GLUCONATE 4 % EX LIQD: 60.0000 mL | Freq: Once | CUTANEOUS | Status: DC

## 2011-05-16 MED ORDER — MEPERIDINE HCL 25 MG/ML IJ SOLN: 6.2500 mg | INTRAMUSCULAR | Status: DC | PRN

## 2011-05-16 MED ORDER — CEFAZOLIN SODIUM-DEXTROSE 2-3 GM-% IV SOLR
2.0000 g | Freq: Four times a day (QID) | INTRAVENOUS | Status: AC
  Administered 2011-05-16 – 2011-05-17 (×3): 2 g via INTRAVENOUS

## 2011-05-16 MED ORDER — METOCLOPRAMIDE HCL 5 MG/ML IJ SOLN: 5.0000 mg | Freq: Three times a day (TID) | INTRAMUSCULAR | Status: DC | PRN

## 2011-05-16 MED ORDER — SODIUM CHLORIDE 0.9 % IV SOLN: INTRAVENOUS | Status: DC

## 2011-05-16 MED ORDER — PROPOFOL 10 MG/ML IV EMUL
INTRAVENOUS | Status: DC | PRN
  Administered 2011-05-16: 250 mg via INTRAVENOUS

## 2011-05-16 MED ORDER — FENTANYL CITRATE 0.05 MG/ML IJ SOLN
INTRAMUSCULAR | Status: DC | PRN
  Administered 2011-05-16: 50 ug via INTRAVENOUS
  Administered 2011-05-16 (×2): 25 ug via INTRAVENOUS
  Administered 2011-05-16: 50 ug via INTRAVENOUS
  Administered 2011-05-16: 25 ug via INTRAVENOUS

## 2011-05-16 MED ORDER — METHOCARBAMOL 100 MG/ML IJ SOLN: 500.0000 mg | Freq: Four times a day (QID) | INTRAVENOUS | Status: DC | PRN

## 2011-05-16 MED ORDER — SODIUM CHLORIDE 0.9 % IV SOLN
INTRAVENOUS | Status: DC
  Administered 2011-05-16: 18:00:00 via INTRAVENOUS

## 2011-05-16 MED ORDER — ONDANSETRON HCL 4 MG/2ML IJ SOLN: 4.0000 mg | Freq: Four times a day (QID) | INTRAMUSCULAR | Status: DC | PRN

## 2011-05-16 MED ORDER — FENTANYL CITRATE 0.05 MG/ML IJ SOLN: 25.0000 ug | INTRAMUSCULAR | Status: DC | PRN

## 2011-05-16 MED ORDER — EPHEDRINE SULFATE 50 MG/ML IJ SOLN
INTRAMUSCULAR | Status: DC | PRN
  Administered 2011-05-16 (×2): 10 mg via INTRAVENOUS

## 2011-05-16 MED ORDER — LACTATED RINGERS IV SOLN: INTRAVENOUS | Status: DC

## 2011-05-16 MED ORDER — ZOLPIDEM TARTRATE 5 MG PO TABS: 5.0000 mg | ORAL_TABLET | Freq: Every evening | ORAL | Status: DC | PRN

## 2011-05-16 MED ORDER — ACETAMINOPHEN 325 MG PO TABS: 650.0000 mg | ORAL_TABLET | Freq: Four times a day (QID) | ORAL | Status: DC | PRN

## 2011-05-16 MED ORDER — SODIUM CHLORIDE 0.9 % IJ SOLN
INTRAMUSCULAR | Status: DC | PRN
  Administered 2011-05-16: 15:00:00 via INTRAMUSCULAR

## 2011-05-16 MED ORDER — OXYCODONE HCL 5 MG PO TABS: 5.0000 mg | ORAL_TABLET | ORAL | Status: DC | PRN

## 2011-05-16 MED ORDER — SUCCINYLCHOLINE CHLORIDE 20 MG/ML IJ SOLN
INTRAMUSCULAR | Status: DC | PRN
  Administered 2011-05-16: 180 mg via INTRAVENOUS

## 2011-05-16 MED ORDER — HYDROMORPHONE HCL PF 1 MG/ML IJ SOLN: 0.5000 mg | INTRAMUSCULAR | Status: DC | PRN

## 2011-05-16 MED ORDER — METHOCARBAMOL 500 MG PO TABS: 500.0000 mg | ORAL_TABLET | Freq: Four times a day (QID) | ORAL | Status: DC | PRN

## 2011-05-16 MED ORDER — MENTHOL 3 MG MT LOZG: 1.0000 | LOZENGE | OROMUCOSAL | Status: DC | PRN

## 2011-05-16 MED ORDER — ROPIVACAINE HCL 5 MG/ML IJ SOLN
INTRAMUSCULAR | Status: DC | PRN
  Administered 2011-05-16: 30 mL

## 2011-05-16 MED ORDER — METOCLOPRAMIDE HCL 5 MG PO TABS: 5.0000 mg | ORAL_TABLET | Freq: Three times a day (TID) | ORAL | Status: DC | PRN

## 2011-05-16 MED ORDER — POVIDONE-IODINE 7.5 % EX SOLN: Freq: Once | CUTANEOUS | Status: DC

## 2011-05-16 MED ORDER — PHENOL 1.4 % MT LIQD: 1.0000 | OROMUCOSAL | Status: DC | PRN

## 2011-05-16 MED ORDER — SODIUM CHLORIDE 0.9 % IR SOLN
Status: DC | PRN
  Administered 2011-05-16: 32000 mL

## 2011-05-16 MED ORDER — PROMETHAZINE HCL 25 MG/ML IJ SOLN: 6.2500 mg | INTRAMUSCULAR | Status: DC | PRN

## 2011-05-16 MED ORDER — DEXAMETHASONE SODIUM PHOSPHATE 4 MG/ML IJ SOLN
INTRAMUSCULAR | Status: DC | PRN
  Administered 2011-05-16: 8 mg via INTRAVENOUS

## 2011-05-16 SURGICAL SUPPLY — 78 items
BLADE CUDA 4.2 (BLADE) IMPLANT
BLADE CUDA GRT WHITE 3.5 (BLADE) ×2 IMPLANT
BLADE CUTTER GATOR 3.5 (BLADE) IMPLANT
BLADE GREAT WHITE 4.2 (BLADE) ×2 IMPLANT
BLADE SURG 11 STRL SS (BLADE) ×2 IMPLANT
BLADE SURG 15 STRL LF DISP TIS (BLADE) ×1 IMPLANT
BLADE SURG 15 STRL SS (BLADE) ×2
BUR 3.5 LG SPHERICAL (BURR) IMPLANT
BUR OVAL 6.0 (BURR) ×2 IMPLANT
BURR 3.5 LG SPHERICAL (BURR)
CANISTER SUCT LVC 12 LTR MEDI- (MISCELLANEOUS) ×5 IMPLANT
CANISTER SUCTION 2500CC (MISCELLANEOUS) IMPLANT
CANNULA 5.75X7 CRYSTAL CLEAR (CANNULA) ×2 IMPLANT
CANNULA 5.75X71 LONG (CANNULA) IMPLANT
CANNULA TWIST IN 8.25X7CM (CANNULA) ×4 IMPLANT
CLOTH BEACON ORANGE TIMEOUT ST (SAFETY) ×2 IMPLANT
COVER MAYO STAND STRL (DRAPES) ×2 IMPLANT
COVER TABLE BACK 60X90 (DRAPES) ×2 IMPLANT
DRAPE LG THREE QUARTER DISP (DRAPES) IMPLANT
DRAPE ORTHO SPLIT 77X108 STRL (DRAPES) ×4
DRAPE POUCH INSTRU U-SHP 10X18 (DRAPES) ×2 IMPLANT
DRAPE STERI 35X30 U-POUCH (DRAPES) ×2 IMPLANT
DRAPE SURG 17X23 STRL (DRAPES) ×2 IMPLANT
DRAPE SURG ORHT 6 SPLT 77X108 (DRAPES) ×2 IMPLANT
DRAPE U-SHAPE 47X51 STRL (DRAPES) ×2 IMPLANT
DRSG PAD ABDOMINAL 8X10 ST (GAUZE/BANDAGES/DRESSINGS) ×3 IMPLANT
DRSG XEROFORM 1X8 (GAUZE/BANDAGES/DRESSINGS) ×1 IMPLANT
DURAPREP 26ML APPLICATOR (WOUND CARE) ×2 IMPLANT
ELECT MENISCUS 165MM 90D (ELECTRODE) IMPLANT
ELECT REM PT RETURN 9FT ADLT (ELECTROSURGICAL) ×2
ELECTRODE REM PT RTRN 9FT ADLT (ELECTROSURGICAL) ×1 IMPLANT
FIBERSTICK 2 (SUTURE) IMPLANT
GAUZE XEROFORM 1X8 LF (GAUZE/BANDAGES/DRESSINGS) ×2 IMPLANT
GLOVE BIO SURGEON STRL SZ7.5 (GLOVE) ×2 IMPLANT
GLOVE INDICATOR 6.5 STRL GRN (GLOVE) ×3 IMPLANT
GLOVE INDICATOR 8.0 STRL GRN (GLOVE) ×4 IMPLANT
GLOVE SURG ORTHO 8.0 STRL STRW (GLOVE) ×2 IMPLANT
GOWN STRL REIN XL XLG (GOWN DISPOSABLE) ×3 IMPLANT
GOWN W/COTTON TOWEL STD LRG (GOWNS) ×3 IMPLANT
GOWN XL W/COTTON TOWEL STD (GOWNS) ×1 IMPLANT
IV NS IRRIG 3000ML ARTHROMATIC (IV SOLUTION) ×9 IMPLANT
KIT SHOULDER TRACTION (DRAPES) ×2 IMPLANT
LASSO SUT 90 DEGREE (SUTURE) IMPLANT
NDL 1/2 CIR CATGUT .05X1.09 (NEEDLE) IMPLANT
NDL SCORPION (NEEDLE) IMPLANT
NDL SPNL 18GX3.5 QUINCKE PK (NEEDLE) ×1 IMPLANT
NEEDLE 1/2 CIR CATGUT .05X1.09 (NEEDLE) IMPLANT
NEEDLE HYPO 22GX1.5 SAFETY (NEEDLE) ×2 IMPLANT
NEEDLE SCORPION (NEEDLE) ×2 IMPLANT
NEEDLE SPNL 18GX3.5 QUINCKE PK (NEEDLE) ×2 IMPLANT
NS IRRIG 500ML POUR BTL (IV SOLUTION) IMPLANT
PACK BASIN DAY SURGERY FS (CUSTOM PROCEDURE TRAY) ×2 IMPLANT
PENCIL BUTTON HOLSTER BLD 10FT (ELECTRODE) IMPLANT
PUSHLOCK BIOCOMP 4.5X24 (Orthopedic Implant) ×2 IMPLANT
SET ARTHROSCOPY TUBING (MISCELLANEOUS) ×2
SET ARTHROSCOPY TUBING LN (MISCELLANEOUS) ×1 IMPLANT
SLING ULTRA II AB L (ORTHOPEDIC SUPPLIES) IMPLANT
SLING ULTRA II AB S (ORTHOPEDIC SUPPLIES) IMPLANT
SLING ULTRA II L (ORTHOPEDIC SUPPLIES) ×1 IMPLANT
SPONGE GAUZE 4X4 12PLY (GAUZE/BANDAGES/DRESSINGS) ×2 IMPLANT
SPONGE LAP 4X18 X RAY DECT (DISPOSABLE) ×1 IMPLANT
SUCTION FRAZIER TIP 10 FR DISP (SUCTIONS) IMPLANT
SUT ETHILON 2 0 PS N (SUTURE) ×2 IMPLANT
SUT LASSO 45 DEGREE LEFT (SUTURE) IMPLANT
SUT LASSO 45D RIGHT (SUTURE) IMPLANT
SUT PDS AB 1 CT1 27 (SUTURE) IMPLANT
SUT VIC AB 0 CT1 36 (SUTURE) IMPLANT
SUT VIC AB 2-0 CT1 27 (SUTURE)
SUT VIC AB 2-0 CT1 TAPERPNT 27 (SUTURE) IMPLANT
SYR 20CC LL (SYRINGE) ×2 IMPLANT
SYR CONTROL 10ML LL (SYRINGE) IMPLANT
TAPE CLOTH SURG 4X10 WHT LF (GAUZE/BANDAGES/DRESSINGS) ×1 IMPLANT
TOWEL OR 17X24 6PK STRL BLUE (TOWEL DISPOSABLE) ×3 IMPLANT
TUBE CONNECTING 12X1/4 (SUCTIONS) ×5 IMPLANT
WAND 90 DEG TURBOVAC W/CORD (SURGICAL WAND) ×2 IMPLANT
WATER STERILE IRR 500ML POUR (IV SOLUTION) ×2 IMPLANT
YANKAUER SUCT BULB TIP NO VENT (SUCTIONS) IMPLANT
biocomposite ×4 IMPLANT

## 2011-05-16 NOTE — Anesthesia Preprocedure Evaluation (Addendum)
Anesthesia Evaluation  Patient identified by MRN, date of birth, ID band Patient awake    Reviewed: Allergy & Precautions, H&P , NPO status , Patient's Chart, lab work & pertinent test results  Airway Mallampati: III TM Distance: >3 FB Neck ROM: Full    Dental No notable dental hx. (+) Edentulous Upper and Upper Dentures   Pulmonary neg pulmonary ROS,  clear to auscultation  Pulmonary exam normal       Cardiovascular hypertension, Pt. on medications + CAD (s/p cabg 2002. cardiac cath 2009 showed patent grafts), +CHF (ef 30-40%) and neg cardio ROS + dysrhythmias (on amiodaore) Atrial Fibrillation Regular Normal    Neuro/Psych Negative Neurological ROS  Negative Psych ROS   GI/Hepatic negative GI ROS, Neg liver ROS,   Endo/Other  Negative Endocrine ROSMorbid obesity  Renal/GU Renal InsufficiencyRenal diseasenegative Renal ROS  Genitourinary negative   Musculoskeletal negative musculoskeletal ROS (+)   Abdominal   Peds negative pediatric ROS (+)  Hematology negative hematology ROS (+)   Anesthesia Other Findings   Reproductive/Obstetrics negative OB ROS                         Anesthesia Physical Anesthesia Plan  ASA: III  Anesthesia Plan: General   Post-op Pain Management:    Induction: Intravenous  Airway Management Planned: Oral ETT  Additional Equipment:   Intra-op Plan:   Post-operative Plan: Extubation in OR  Informed Consent: I have reviewed the patients History and Physical, chart, labs and discussed the procedure including the risks, benefits and alternatives for the proposed anesthesia with the patient or authorized representative who has indicated his/her understanding and acceptance.   Dental advisory given  Plan Discussed with:   Anesthesia Plan Comments:         Anesthesia Quick Evaluation

## 2011-05-16 NOTE — Op Note (Signed)
Preop diagnosis right shoulder subluxed biceps tendon, rotator cuff tear, a.c. arthritis postop  Postop diagnosis right shoulder medial subluxation of biceps extensive biceps tendinopathy and partial tearing large, massive rotator cuff tear a.c. arthritis Procedure 1 right shoulder arthroscopy with biceps tenotomy to subacromial decompression #3 a.c. joint arthritis #4 large, massive rotator cuff tear with retraction rotator cuff tendinopathy   Surgeon Valma Cava M.D. Asst. Oneida Alar PA-C anesthesia Gen. with interscalene block Complications none Disposition PACU stable  Operative details Patient was counseled in the holding area cracks site was marked and signed appropriately chart was reviewed and signed appropriately. Even started interscalene block administered per the anesthesiologist. On the way the operating room 2 g of Ancef were given intravenously. In the OR patient's posterior general anesthesia. PAS stockings were applied to both lower extremities for DVT prophylaxis. Patient was turned into a left lateral decubitus position probably padded and bumped. Right shoulder was examined full functional range of motion and stable. Prepped with DuraPrep draped into a sterile fashion. Overhead shoulder positioner was utilized with 15 pounds longitudinal traction. Met was done and confirmed by all involved and room.  Tear portal was crated arthroscope placed into the glenohumeral joint. Long head of biceps on the medial subluxed and extensive partial tearing lateral portal was established the biceps was placed of biceps tenotomy and superior aspect was debris with shaver. No significant glenohumeral arthritis at this time. Subacromial region revealed a retracted rotator cuff involved with the supraspinatous and infraspinatus. He was mobilized with intra-articular and extra-articular tourniquet be brought back to the edge articular surface. Tuberosity preventively in bowel with a bur and assistant  regloved Lisa periosteum assayed ligament burs and placed posteriorly an anterior inferior acromioplasty was performed for a flat acromion removing the sunrise subclavicular spur.  Access into portal was made in the ICU was found to be markedly osteoarthritic with a lot of subclavicular spur burs and placed the lateral 5-8 mm of the cuff was moved was removed circumferentially leaving the superior capsule intact by postoperative be stable.  Small puncture was then made an Arthrex bio composite anchor was placed in correct position mattress suture placed into the supraspinatous. Small post was applied posteriorly and another bio composite push lock anchor and one suture mattress sutures were placed into the hip was modest FiberWire. Positive tiedown utilized in arthroscopic technique. The leading 3 suture limbs were then placed into a bio composite push lock anchor intact and position. The small portals my posteriorly for more sutures were placed in this impingement bowel composite push lock anchor tapped into position bring about the supraspinatous and infraspinous tendons back to look at his articular surface to the bleeding bone. The tendon  was in a repair based upon the magnitude of his tear size and the quadratus tissue which was only fair. Sponge and a count were correct. Arthroscopic root was removed. Taken out of traction. The wrist had normal pulses. Endo Close a 4 nylon suture. A another 10 cc of Sensorcaine was placed into the wound edges and subacromial region. Sterile dressing was applied in supine placed in a shoulder abduction sling awakened and taken to operating room to PACU in stable condition. He'll be kept overnight for monitoring.  To help with patient prepping draping positioning arthroscopic suture management and help wound closure application dressing and sling Mr. Nadine Counts [facility] assistance was needed throughout this entire difficult case.

## 2011-05-16 NOTE — H&P (Signed)
Jason Choi is an 70 y.o. male.   Chief Complaint: Rt Shoulder pain 73 yoa male with rt shoulder pain, mri findings Tears of rotator cuff, biceps tear, degen labrum.AC OA  Past Medical History  Diagnosis Date  . S/P CABG (coronary artery bypass graft) 2002    SVG to PDA, Free radial to Intermediate, LIMA to LAD  . HTN (hypertension)   . Hypercholesteremia   . LV dysfunction     EF 35 to 40% per echo May 2012  . Chronic anticoagulation COUMADIN  . 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 High risk medication use     on amiodarone  . Atrial fibrillation AMIODORONE AND COUMADIN THERAPY    PER VISIT NOTE IN [facility]  . CHF (congestive heart failure)   . Pulmonary hypertension, moderate to severe PER CARDIAC CATH REPORT  09-29-10  . Cataract immature BILATERAL   . Osteoarthritis of right shoulder region     ACUTE PAIN  . Coronary artery disease CARDIOLOGIST- DR [place]-- VISIT NOTE 04-10-11 ( IN EPIC AND W/ CHART)    DENIES  S & S   (CARDIAC CATH REPORTS W/ CHART)  . Echocardiogram abnormal 10-10-10    Past Surgical History  Procedure Date  . Right leg surgeries     due to polio  . Left knee arthroscopy 2000  . Cardiac catheterization 2009    Grafts patent  . Cardiac catheterization 2012    Grafts patent. EF was 25 to 30% but 35 to 40 by echo  . Cardiac catheterization 2003  . Coronary artery bypass graft 2002    X3 VESSEL  . Cardioversion 2008--  FOR PAF    SUCCESSFUL    Family History  Problem Relation Age of Onset  . Valvular heart disease Father    Social History:  reports that he quit smoking about 32 years ago. His smoking use included Cigarettes. He has never used smokeless tobacco. He reports that he drinks alcohol. He reports that he does not use illicit drugs.  Allergies:  Allergies  Allergen Reactions  . Novocain     Unknown reaction    Medications Prior to Admission  Medication Dose Route Frequency Provider Last Rate Last Dose  . 0.9 %   sodium chloride infusion   Intravenous Continuous Jamelle Rushing, PA      . ceFAZolin (ANCEF) IVPB 1 g/50 mL premix  1 g Intravenous 60 min Pre-Op Jamelle Rushing, PA      . chlorhexidine (HIBICLENS) 4 % liquid 4 application  60 mL Topical Once Jamelle Rushing, [place]      . fentaNYL (SUBLIMAZE) injection 100 mcg  100 mcg Intravenous Once [employer]   100 mcg at 05/16/11 1225  . lactated ringers infusion   Intravenous Continuous Phillips Grout, MD      . midazolam (VERSED) injection 2 mg  2 mg Intravenous Once Erasmo Leventhal   2 mg at 05/16/11 1225  . povidone-iodine (BETADINE) 7.5 % scrub   Topical Once Jamelle Rushing, [place]      . sodium chloride irrigation 0.9 %    PRN Erasmo Leventhal   12,000 mL at 05/16/11 1247  . sterile water for irrigation    PRN Erasmo Leventhal   500 mL at 05/16/11 1248   Medications Prior to Admission  Medication Sig Dispense Refill  . acetaminophen (TYLENOL) 500 MG tablet Take 500 mg by mouth every 6 (six)  hours as needed.        Marland Kitchen amiodarone (PACERONE) 200 MG tablet        . aspirin 81 MG tablet Take 81 mg by mouth daily.       Marland Kitchen atorvastatin (LIPITOR) 40 MG tablet        . carvedilol (COREG) 25 MG tablet Take 12.5 mg by mouth 2 (two) times daily with a meal.       . fish oil-omega-3 fatty acids 1000 MG capsule Take 1 g by mouth daily.        . furosemide (LASIX) 40 MG tablet Take 40 mg by mouth daily.       . naproxen sodium (ANAPROX) 220 MG tablet Take 220 mg by mouth as needed.        . Multiple Vitamin (MULTIVITAMIN) tablet Take 1 tablet by mouth daily.        . nitroGLYCERIN (NITRODUR - DOSED IN MG/24 HR) 0.4 mg/hr Ad lib.      Marland Kitchen spironolactone (ALDACTONE) 25 MG tablet Take 25 mg by mouth daily. Dr [place] stopped med. Several weeks ago, pt light-headed.         Results for orders placed during the hospital encounter of 05/16/11 (from the past 48 hour(s))  POCT I-STAT 4, (NA,K, GLUC, HGB,HCT)     Status: Abnormal   Collection Time    05/16/11 11:46 AM      Component Value Range Comment   Sodium 140  135 - 145 (mEq/L)    Potassium 4.1  3.5 - 5.1 (mEq/L)    Glucose, Bld 101 (*) 70 - 99 (mg/dL)    HCT 91.4 (*) 78.2 - 52.0 (%)    Hemoglobin 11.9 (*) 13.0 - 17.0 (g/dL)   PROTIME-INR     Status: Normal   Collection Time   05/16/11 11:52 AM      Component Value Range Comment   Prothrombin Time 15.1  11.6 - 15.2 (seconds)    INR 1.17  0.00 - 1.49     No results found.  Review of Systems  Constitutional: Negative.   HENT: Negative.   Eyes: Negative.   Respiratory: Negative.   Cardiovascular: Negative.   Gastrointestinal: Negative.   Musculoskeletal: Positive for joint pain.  Skin: Negative.   Neurological: Negative.     Blood pressure 141/76, pulse 66, temperature 97.2 F (36.2 C), temperature source Oral, resp. rate 17, height 6\' 4"  (1.93 m), weight 113.399 kg (250 lb), SpO2 99.00%. Physical Exam  Cons alert approp. Lungs clear, heart reg, abd soft.Rt arm with block good pulse weak. Shoulder muscles, left wnl.   Assessment/Plan Rt shoulder RCT, Impingment, AC OA, biceps tear. Plan Rt shoulder scope with RCTR, SAD/DCR biceps tenotomy.  Jason Choi 05/16/2011, 1:51 PM   I have seen and examined this patient.  Agree with the note above.  Jason Choi 05/16/2011 1:52 PM

## 2011-05-16 NOTE — H&P (Signed)
Jason Choi is an 70 y.o. male.   Chief Complaint: Rt Shoulder pain 5 yoa male with rt shoulder pain, mri findings Tears of rotator cuff, biceps tear, degen labrum.AC OA  Past Medical History  Diagnosis Date  . S/P CABG (coronary artery bypass graft) 2002    SVG to PDA, Free radial to Intermediate, LIMA to LAD  . HTN (hypertension)   . Hypercholesteremia   . LV dysfunction     EF 35 to 40% per echo May 2012  . Chronic anticoagulation COUMADIN  . 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 High risk medication use     on amiodarone  . Atrial fibrillation AMIODORONE AND COUMADIN THERAPY    PER VISIT NOTE IN [facility]  . CHF (congestive heart failure)   . Pulmonary hypertension, moderate to severe PER CARDIAC CATH REPORT  09-29-10  . Cataract immature BILATERAL   . Osteoarthritis of right shoulder region     ACUTE PAIN  . Coronary artery disease CARDIOLOGIST- DR [place]-- VISIT NOTE 04-10-11 ( IN EPIC AND W/ CHART)    DENIES  S & S   (CARDIAC CATH REPORTS W/ CHART)  . Echocardiogram abnormal 10-10-10    Past Surgical History  Procedure Date  . Right leg surgeries     due to polio  . Left knee arthroscopy 2000  . Cardiac catheterization 2009    Grafts patent  . Cardiac catheterization 2012    Grafts patent. EF was 25 to 30% but 35 to 40 by echo  . Cardiac catheterization 2003  . Coronary artery bypass graft 2002    X3 VESSEL  . Cardioversion 2008--  FOR PAF    SUCCESSFUL    Family History  Problem Relation Age of Onset  . Valvular heart disease Father    Social History:  reports that he quit smoking about 32 years ago. His smoking use included Cigarettes. He has never used smokeless tobacco. He reports that he drinks alcohol. He reports that he does not use illicit drugs.  Allergies:  Allergies  Allergen Reactions  . Novocain     Unknown reaction    Medications Prior to Admission  Medication Dose Route Frequency Provider Last Rate Last Dose  . 0.9 %   sodium chloride infusion   Intravenous Continuous Jamelle Rushing, PA      . ceFAZolin (ANCEF) IVPB 1 g/50 mL premix  1 g Intravenous 60 min Pre-Op Jamelle Rushing, PA      . chlorhexidine (HIBICLENS) 4 % liquid 4 application  60 mL Topical Once Jamelle Rushing, [place]      . fentaNYL (SUBLIMAZE) injection 100 mcg  100 mcg Intravenous Once [employer]   100 mcg at 05/16/11 1225  . lactated ringers infusion   Intravenous Continuous Phillips Grout, MD      . midazolam (VERSED) injection 2 mg  2 mg Intravenous Once Erasmo Leventhal   2 mg at 05/16/11 1225  . povidone-iodine (BETADINE) 7.5 % scrub   Topical Once Jamelle Rushing, [place]      . sodium chloride irrigation 0.9 %    PRN Erasmo Leventhal   12,000 mL at 05/16/11 1247  . sterile water for irrigation    PRN Erasmo Leventhal   500 mL at 05/16/11 1248   Medications Prior to Admission  Medication Sig Dispense Refill  . acetaminophen (TYLENOL) 500 MG tablet Take 500 mg by mouth every 6 (six)  hours as needed.        Marland Kitchen amiodarone (PACERONE) 200 MG tablet        . aspirin 81 MG tablet Take 81 mg by mouth daily.       Marland Kitchen atorvastatin (LIPITOR) 40 MG tablet        . carvedilol (COREG) 25 MG tablet Take 12.5 mg by mouth 2 (two) times daily with a meal.       . fish oil-omega-3 fatty acids 1000 MG capsule Take 1 g by mouth daily.        . furosemide (LASIX) 40 MG tablet Take 40 mg by mouth daily.       . naproxen sodium (ANAPROX) 220 MG tablet Take 220 mg by mouth as needed.        . Multiple Vitamin (MULTIVITAMIN) tablet Take 1 tablet by mouth daily.        . nitroGLYCERIN (NITRODUR - DOSED IN MG/24 HR) 0.4 mg/hr Ad lib.      Marland Kitchen spironolactone (ALDACTONE) 25 MG tablet Take 25 mg by mouth daily. Dr [place] stopped med. Several weeks ago, pt light-headed.         Results for orders placed during the hospital encounter of 05/16/11 (from the past 48 hour(s))  POCT I-STAT 4, (NA,K, GLUC, HGB,HCT)     Status: Abnormal   Collection Time    05/16/11 11:46 AM      Component Value Range Comment   Sodium 140  135 - 145 (mEq/L)    Potassium 4.1  3.5 - 5.1 (mEq/L)    Glucose, Bld 101 (*) 70 - 99 (mg/dL)    HCT 16.1 (*) 09.6 - 52.0 (%)    Hemoglobin 11.9 (*) 13.0 - 17.0 (g/dL)   PROTIME-INR     Status: Normal   Collection Time   05/16/11 11:52 AM      Component Value Range Comment   Prothrombin Time 15.1  11.6 - 15.2 (seconds)    INR 1.17  0.00 - 1.49     No results found.  Review of Systems  Constitutional: Negative.   HENT: Negative.   Eyes: Negative.   Respiratory: Negative.   Cardiovascular: Negative.   Gastrointestinal: Negative.   Musculoskeletal: Positive for joint pain.  Skin: Negative.   Neurological: Negative.     Blood pressure 141/76, pulse 66, temperature 97.2 F (36.2 C), temperature source Oral, resp. rate 17, height 6\' 4"  (1.93 m), weight 113.399 kg (250 lb), SpO2 99.00%. Physical Exam Cons alert approp. Lungs clear, heart reg, abd soft.Rt arm with block good pulse weak. Shoulder muscles, left wnl.   Assessment/Plan Rt shoulder RCT, Impingment, AC OA, biceps tear. Plan Rt shoulder scope with RCTR, SAD/DCR biceps tenotomy.  Jamelle Rushing 05/16/2011, 1:25 PM

## 2011-05-16 NOTE — Transfer of Care (Signed)
Immediate Anesthesia Transfer of Care Note  Patient: Jason Choi  Procedure(s) Performed:  SHOULDER ARTHROSCOPY WITH ROTATOR CUFF REPAIR - GENERAL ANES WITH INTRA SCALENE BLOCK RIGHT SHOULDER SCOPE SUB ACROMIAL DECOMPRESSION AND DISTAL CLAVICLE RESECTION ROTATOR CUFF REPAIR , BICEPS TENOTOMY  OWER   Patient Location: Patient transported to PACU with oxygen via face mask at 6 Liters / Min  Anesthesia Type: General  Level of Consciousness: awake and alert   Airway & Oxygen Therapy: Patient Spontanous Breathing and Patient connected to face mask oxygen Post-op Assessment: Report given to PACU RN and Post -op Vital signs reviewed and stable  Post vital signs: Reviewed and stable  Complications: No apparent anesthesia complications

## 2011-05-16 NOTE — Anesthesia Procedure Notes (Addendum)
Anesthesia Regional Block:  Supraclavicular block  Pre-Anesthetic Checklist: ,, timeout performed, Correct Patient, Correct Site, Correct Laterality, Correct Procedure, Correct Position, site marked, Risks and benefits discussed,  Surgical consent,  Pre-op evaluation,  At surgeon's request and post-op pain management  Laterality: Right  Prep: chloraprep       Needles:  Injection technique: Single-shot  Needle Type: Stimiplex     Needle Length: 10cm 10 cm     Additional Needles:  Procedures: ultrasound guided and nerve stimulator Supraclavicular block Narrative:  Start time: 05/16/2011 12:30 PM Injection made incrementally with aspirations every 5 mL.  Performed by: Personally  Anesthesiologist: Phillips Grout MD  Additional Notes: Risks, benefits and alternative to block explained extensively.  Patient tolerated procedure well, without complications.  Supraclavicular block Procedure Name: Intubation Date/Time: 05/16/2011 2:08 PM Performed by: Lorrin Jackson Pre-anesthesia Checklist: Patient identified, Emergency Drugs available, Suction available and Patient being monitored Patient Re-evaluated:Patient Re-evaluated prior to inductionOxygen Delivery Method: Circle System Utilized Preoxygenation: Pre-oxygenation with 100% oxygen Intubation Type: IV induction Ventilation: Two handed mask ventilation required Laryngoscope Size: Mac and 4 Grade View: Grade II Tube type: Oral Number of attempts: 1 Airway Equipment and Method: stylet and oral airway Placement Confirmation: ETT inserted through vocal cords under direct vision,  positive ETCO2 and breath sounds checked- equal and bilateral Secured at: 23 cm Tube secured with: Tape Dental Injury: Teeth and Oropharynx as per pre-operative assessment

## 2011-05-16 NOTE — Anesthesia Postprocedure Evaluation (Signed)
Anesthesia Post Note  Patient: Jason Choi  Procedure(s) Performed:  SHOULDER ARTHROSCOPY WITH ROTATOR CUFF REPAIR - GENERAL ANES WITH INTRA SCALENE BLOCK RIGHT SHOULDER SCOPE SUB ACROMIAL DECOMPRESSION AND DISTAL CLAVICLE RESECTION ROTATOR CUFF REPAIR , BICEPS TENOTOMY  OWER   Anesthesia type: General  Patient location: PACU  Post pain: Pain level controlled  Post assessment: Post-op Vital signs reviewed  Last Vitals:  Filed Vitals:   05/16/11 1609  BP: 142/96  Pulse: 92  Temp: 36.3 C  Resp: 13    Post vital signs: Reviewed  Level of consciousness: sedated  Complications: No apparent anesthesia complications

## 2011-05-17 MED ORDER — METHOCARBAMOL 500 MG PO TABS: 500.0000 mg | ORAL_TABLET | Freq: Four times a day (QID) | ORAL | Status: AC | PRN

## 2011-05-17 MED ORDER — OXYCODONE HCL 5 MG PO TABS: 5.0000 mg | ORAL_TABLET | ORAL | Status: AC | PRN

## 2011-05-17 NOTE — Discharge Summary (Signed)
Physician Discharge Summary  Patient ID: Jason Choi MRN: [id number] DOB/AGE: April 12, 1941 70 y.o.  Admit date: 05/16/2011 Discharge date: 05/17/2011  Admission Diagnoses: Right shoulder rotator cuff tear  Discharge Diagnoses: Arthroscopic repair of right shoulder rotator cuff tear  Active Problems:  * No active hospital problems. *    Discharged Condition: good  Hospital Course: Patient did very well there is as overnight hospitalization  Consults: none  Significant Diagnostic Studies: None  Treatments: surgery: Right shoulder arthroscopic rotator cuff repair  Discharge Exam: Blood pressure 149/79, pulse 87, temperature 97.5 F (36.4 C), temperature source Oral, resp. rate 18, height 6\' 4"  (1.93 m), weight 113.399 kg (250 lb), SpO2 96.00%. Incision/Wound:  patient's conscious alert appropriate resting very comfortable in no distress. His right shoulder wound dressing is intact his arm is neuromotor vascularly intact. He is in a sling abduction pillow device.  Disposition: Home or Self Care  Discharge Orders    Future Appointments: Provider: Department: Dept Phone: Center:   05/26/2011 9:15 AM Raul Del, RN [facility] [phone] None   07/14/2011 8:30 AM Peter [place], MD [facility] [phone] None     Future Orders Please Complete By Expires   Diet general      Call MD / Call 911      Comments:   If you experience chest pain or shortness of breath, CALL 911 and be transported to the hospital emergency room.  If you develope a fever above 101 F, pus (white drainage) or increased drainage or redness at the wound, or calf pain, call your surgeon's office.   Increase activity slowly as tolerated      Discharge instructions      Comments:   Keep arm in sling. Call [phone] for follow up appointment   Driving restrictions      Comments:   No driving for 8 weeks     Medication List  As of 05/17/2011  7:14 AM   START taking these medications       methocarbamol 500 MG tablet   Commonly known as: ROBAXIN   Take 1 tablet (500 mg total) by mouth every 6 (six) hours as needed.      oxyCODONE 5 MG immediate release tablet   Commonly known as: Oxy IR/ROXICODONE   Take 1-2 tablets (5-10 mg total) by mouth every 3 (three) hours as needed.         CONTINUE taking these medications         amiodarone 200 MG tablet   Commonly known as: PACERONE      aspirin 81 MG tablet      atorvastatin 40 MG tablet   Commonly known as: LIPITOR      carvedilol 25 MG tablet   Commonly known as: COREG      fish oil-omega-3 fatty acids 1000 MG capsule      furosemide 40 MG tablet   Commonly known as: LASIX      multivitamin tablet      nitroGLYCERIN 0.4 mg/hr   Commonly known as: NITRODUR - Dosed in mg/24 hr      ramipril 10 MG capsule   Commonly known as: ALTACE   TAKE ONE CAPSULE BY MOUTH DAILY      spironolactone 25 MG tablet   Commonly known as: ALDACTONE      warfarin 5 MG tablet   Commonly known as: COUMADIN   Take 1 tablet (5 mg total) by mouth as directed.  STOP taking these medications         acetaminophen 500 MG tablet      naproxen sodium 220 MG tablet          Where to get your medications    These are the prescriptions that you need to pick up.   You may get these medications from any pharmacy.         methocarbamol 500 MG tablet   oxyCODONE 5 MG immediate release tablet             Signed: Jamelle Rushing 05/17/2011, 7:14 AM

## 2011-05-17 NOTE — Progress Notes (Signed)
Patient ID: Jason Choi, male   DOB: 08-18-1940, 70 y.o.   MRN: [id number] Patient denies any complaints this morning Exam his right shoulder dressing is intact without any bleedthrough his right arm is in a sling abduction pillow device his hand is neuromotor vascularly intact [employer] Impression postop day #1 right shoulder rotator cuff repair doing very well Plan discharge home with one week followup and start physical therapy per standard protocol

## 2011-05-26 ENCOUNTER — Ambulatory Visit (INDEPENDENT_AMBULATORY_CARE_PROVIDER_SITE_OTHER): Payer: Medicare Other | Admitting: *Deleted

## 2011-05-26 DIAGNOSIS — I4891 Unspecified atrial fibrillation: Secondary | ICD-10-CM

## 2011-06-16 ENCOUNTER — Encounter: Payer: Medicare Other | Admitting: *Deleted

## 2011-06-16 ENCOUNTER — Ambulatory Visit (INDEPENDENT_AMBULATORY_CARE_PROVIDER_SITE_OTHER): Payer: Medicare Other | Admitting: *Deleted

## 2011-06-16 DIAGNOSIS — I4891 Unspecified atrial fibrillation: Secondary | ICD-10-CM

## 2011-06-21 ENCOUNTER — Other Ambulatory Visit: Payer: Self-pay | Admitting: Cardiology

## 2011-07-07 ENCOUNTER — Ambulatory Visit (INDEPENDENT_AMBULATORY_CARE_PROVIDER_SITE_OTHER): Payer: Medicare Other | Admitting: *Deleted

## 2011-07-07 DIAGNOSIS — I4891 Unspecified atrial fibrillation: Secondary | ICD-10-CM

## 2011-07-14 ENCOUNTER — Ambulatory Visit: Payer: Medicare Other | Admitting: Cardiology

## 2011-07-19 ENCOUNTER — Ambulatory Visit (INDEPENDENT_AMBULATORY_CARE_PROVIDER_SITE_OTHER): Payer: Medicare Other | Admitting: Nurse Practitioner

## 2011-07-19 ENCOUNTER — Encounter: Payer: Self-pay | Admitting: Nurse Practitioner

## 2011-07-19 VITALS — BP 130/68 | HR 60 | Ht 76.0 in | Wt 255.0 lb

## 2011-07-19 DIAGNOSIS — I2510 Atherosclerotic heart disease of native coronary artery without angina pectoris: Secondary | ICD-10-CM

## 2011-07-19 DIAGNOSIS — I5022 Chronic systolic (congestive) heart failure: Secondary | ICD-10-CM

## 2011-07-19 DIAGNOSIS — R0600 Dyspnea, unspecified: Secondary | ICD-10-CM

## 2011-07-19 DIAGNOSIS — Z79899 Other long term (current) drug therapy: Secondary | ICD-10-CM

## 2011-07-19 DIAGNOSIS — I4891 Unspecified atrial fibrillation: Secondary | ICD-10-CM

## 2011-07-19 DIAGNOSIS — R0989 Other specified symptoms and signs involving the circulatory and respiratory systems: Secondary | ICD-10-CM

## 2011-07-19 DIAGNOSIS — R0609 Other forms of dyspnea: Secondary | ICD-10-CM

## 2011-07-19 DIAGNOSIS — I509 Heart failure, unspecified: Secondary | ICD-10-CM

## 2011-07-19 LAB — BASIC METABOLIC PANEL
BUN: 21 mg/dL (ref 6–23)
CO2: 27 mEq/L (ref 19–32)
Calcium: 8.7 mg/dL (ref 8.4–10.5)
Chloride: 104 mEq/L (ref 96–112)
Creatinine, Ser: 1.2 mg/dL (ref 0.4–1.5)
GFR: 62.65 mL/min (ref >60.00)
Glucose, Bld: 104 mg/dL — ABNORMAL HIGH (ref 70–99)
Potassium: 4.3 mEq/L (ref 3.5–5.1)
Sodium: 141 mEq/L (ref 135–145)

## 2011-07-19 LAB — CBC WITH DIFFERENTIAL/PLATELET
Basophils Absolute: 0 10*3/uL (ref 0.0–0.1)
Basophils Relative: 0.7 % (ref 0.0–3.0)
Eosinophils Absolute: 0.2 10*3/uL (ref 0.0–0.7)
Eosinophils Relative: 2.9 % (ref 0.0–5.0)
HCT: 29.6 % — ABNORMAL LOW (ref 39.0–52.0)
Hemoglobin: 9.8 g/dL — ABNORMAL LOW (ref 13.0–17.0)
Lymphocytes Relative: 13.3 % (ref 12.0–46.0)
Lymphs Abs: 0.8 10*3/uL (ref 0.7–4.0)
MCHC: 33.2 g/dL (ref 30.0–36.0)
MCV: 95.9 fl (ref 78.0–100.0)
Monocytes Absolute: 0.7 10*3/uL (ref 0.1–1.0)
Monocytes Relative: 10.4 % (ref 3.0–12.0)
Neutro Abs: 4.6 10*3/uL (ref 1.4–7.7)
Neutrophils Relative %: 72.7 % (ref 43.0–77.0)
Platelets: 171 10*3/uL (ref 150.0–400.0)
RBC: 3.09 Mil/uL — ABNORMAL LOW (ref 4.22–5.81)
RDW: 15.7 % — ABNORMAL HIGH (ref 11.5–14.6)
WBC: 6.3 10*3/uL (ref 4.5–10.5)

## 2011-07-19 LAB — BRAIN NATRIURETIC PEPTIDE: Pro B Natriuretic peptide (BNP): 555 pg/mL — ABNORMAL HIGH (ref 0.0–100.0)

## 2011-07-19 LAB — TSH: TSH: 0.63 u[IU]/mL (ref 0.35–5.50)

## 2011-07-19 NOTE — Progress Notes (Signed)
Jason Choi Date of Birth: February 26, 1941 Medical Record #[id number]  History of Present Illness: [name] is seen back today for his 3 month check. He is seen for Dr. [place]. He has known CAD with a reduced EF. He has been managed medically. Past plans for ICD were placed on hold and he has had aldactone added to his regimen. This had to be stopped however due to orthostasis along with a lowered dose of Coreg. Last echo was in May of 2012. Other issues include atrial fib on amiodarone, hyperlipidemia, obesity and chronic coumadin.   He comes in today. He is not feeling as well as he would like. He is more short of breath. His weight is up 10 pounds at home. He has abdominal bloating. Difficult to put his shoes on. No chest pain. Has just gotten over a bout of pneumonia and was on Avelox. Cough is improving. He did not have a follow up CXR. He does not know how his rhythm has been. He is unaware of his atrial fib. He remains on amiodarone.   Current Outpatient Prescriptions on File Prior to Visit  Medication Sig Dispense Refill  . amiodarone (PACERONE) 200 MG tablet Take 200 mg by mouth daily.       Marland Kitchen aspirin 81 MG tablet Take 81 mg by mouth daily.       Marland Kitchen atorvastatin (LIPITOR) 40 MG tablet Take 40 mg by mouth daily.       . carvedilol (COREG) 25 MG tablet Take 0.5 tablets (12.5 mg total) by mouth 2 (two) times daily with a meal.  90 tablet  2  . fish oil-omega-3 fatty acids 1000 MG capsule Take 1 g by mouth daily.        . furosemide (LASIX) 40 MG tablet Take 40 mg by mouth daily.       . Multiple Vitamin (MULTIVITAMIN) tablet Take 1 tablet by mouth daily.        . nitroGLYCERIN (NITRODUR - DOSED IN MG/24 HR) 0.4 mg/hr Ad lib.      . ramipril (ALTACE) 10 MG capsule TAKE ONE CAPSULE BY MOUTH DAILY  90 capsule  2  . warfarin (COUMADIN) 5 MG tablet Take 1 tablet (5 mg total) by mouth as directed.  90 tablet  2    Allergies  Allergen Reactions  . Novocain     Unknown reaction    Past  Medical History  Diagnosis Date  . S/P CABG (coronary artery bypass graft) 2002    SVG to PDA, Free radial to Intermediate, LIMA to LAD  . HTN (hypertension)   . Hypercholesteremia   . LV dysfunction     EF 35 to 40% per echo May 2012  . Chronic anticoagulation     on coumadin  . 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 High risk medication use     on amiodarone  . Atrial fibrillation     ON AMIODARONE: PER VISIT NOTE IN [facility] FIRST DEGREE HB  . CHF (congestive heart failure)   . Pulmonary hypertension, moderate to severe   . Cataract immature     BILATERAL  . Osteoarthritis of right shoulder region     ACUTE PAIN  . Coronary artery disease   . Echocardiogram abnormal 10-10-10  . Rotator cuff tear 2012    Past Surgical History  Procedure Date  . Right leg surgeries     due to polio  . Left knee arthroscopy 2000  . Cardiac  catheterization 2009    Grafts patent  . Cardiac catheterization 2012    Grafts patent. EF was 25 to 30% but 35 to 40 by echo  . Cardiac catheterization 2003  . Coronary artery bypass graft 2002    X3 VESSEL  . Cardioversion 2008--  FOR PAF    SUCCESSFUL    History  Smoking status  . Former Smoker  . Types: Cigarettes  . Quit date: 05/12/1979  Smokeless tobacco  . Never Used  Comment: stopped smoking over 30 years ago.    History  Alcohol Use  . Yes    occasional beer    Family History  Problem Relation Age of Onset  . Valvular heart disease Father     Review of Systems: The review of systems is per the HPI.  All other systems were reviewed and are negative.  Physical Exam: BP 130/68  Pulse 60  Ht 6\' 4"  (1.93 m)  Wt 255 lb (115.667 kg)  BMI 31.04 kg/m2 His weight is up 12 pounds.  Patient is very pleasant and in no acute distress. Skin is warm and dry. Color is normal.  HEENT is unremarkable. Normocephalic/atraumatic. PERRL. Sclera are nonicteric. Neck is supple. No masses. No JVD. Lungs are clear. Cardiac exam shows a regular  rate and rhythm. Abdomen is obese but soft. Extremities are with edema, left greater than right. Gait and ROM are intact. No gross neurologic deficits noted.  LABORATORY DATA: EKG shows sinus rhythm. LABS are pending.   Assessment / Plan:

## 2011-07-19 NOTE — Assessment & Plan Note (Signed)
His weight is up considerably. We are going to increase his Lasix to 80 mg in the am and 40 mg in the pm. We are checking labs today. Need to go ahead and update his echo. May need to reconsider ICD implant if we do not see improvement. He has had to cut his Coreg back and is no longer on his aldactone due to orthostasis. I will see him back in one week. He is advised to cut the Lasix back if he has significant diuresis. Patient is agreeable to this plan and will call if any problems develop in the interim.

## 2011-07-19 NOTE — Assessment & Plan Note (Signed)
He is in sinus rhythm today. We are checking labs for follow up of his amiodarone. He does remain on his coumadin.

## 2011-07-19 NOTE — Assessment & Plan Note (Signed)
Last cath in 2012. Grafts were patent. Continue to manage medically.

## 2011-07-19 NOTE — Patient Instructions (Signed)
Increase your Lasix to 80 mg in the am and 40 mg in the pm. Keep weighing each day. Cut the Lasix back if you see your weight falling significantly.   We are going to check your labs today.  We are going to arrange for a repeat echocardiogram  I will see you in a week.

## 2011-07-21 ENCOUNTER — Other Ambulatory Visit: Payer: Self-pay

## 2011-07-21 DIAGNOSIS — I2510 Atherosclerotic heart disease of native coronary artery without angina pectoris: Secondary | ICD-10-CM

## 2011-07-25 ENCOUNTER — Ambulatory Visit (HOSPITAL_COMMUNITY): Payer: Medicare Other | Attending: Cardiovascular Disease

## 2011-07-25 ENCOUNTER — Other Ambulatory Visit (INDEPENDENT_AMBULATORY_CARE_PROVIDER_SITE_OTHER): Payer: Medicare Other

## 2011-07-25 ENCOUNTER — Other Ambulatory Visit: Payer: Self-pay

## 2011-07-25 DIAGNOSIS — R0989 Other specified symptoms and signs involving the circulatory and respiratory systems: Secondary | ICD-10-CM | POA: Insufficient documentation

## 2011-07-25 DIAGNOSIS — I079 Rheumatic tricuspid valve disease, unspecified: Secondary | ICD-10-CM | POA: Insufficient documentation

## 2011-07-25 DIAGNOSIS — Z79899 Other long term (current) drug therapy: Secondary | ICD-10-CM

## 2011-07-25 DIAGNOSIS — I059 Rheumatic mitral valve disease, unspecified: Secondary | ICD-10-CM | POA: Insufficient documentation

## 2011-07-25 DIAGNOSIS — I379 Nonrheumatic pulmonary valve disorder, unspecified: Secondary | ICD-10-CM | POA: Insufficient documentation

## 2011-07-25 DIAGNOSIS — I10 Essential (primary) hypertension: Secondary | ICD-10-CM | POA: Insufficient documentation

## 2011-07-25 DIAGNOSIS — I509 Heart failure, unspecified: Secondary | ICD-10-CM | POA: Insufficient documentation

## 2011-07-25 DIAGNOSIS — R0609 Other forms of dyspnea: Secondary | ICD-10-CM | POA: Insufficient documentation

## 2011-07-25 DIAGNOSIS — I2510 Atherosclerotic heart disease of native coronary artery without angina pectoris: Secondary | ICD-10-CM

## 2011-07-25 DIAGNOSIS — R0600 Dyspnea, unspecified: Secondary | ICD-10-CM

## 2011-07-25 LAB — CBC WITH DIFFERENTIAL/PLATELET
Basophils Absolute: 0 10*3/uL (ref 0.0–0.1)
Basophils Relative: 0.7 % (ref 0.0–3.0)
Eosinophils Absolute: 0.2 10*3/uL (ref 0.0–0.7)
Eosinophils Relative: 2.4 % (ref 0.0–5.0)
HCT: 29.6 % — ABNORMAL LOW (ref 39.0–52.0)
Hemoglobin: 9.7 g/dL — ABNORMAL LOW (ref 13.0–17.0)
Lymphocytes Relative: 13.1 % (ref 12.0–46.0)
Lymphs Abs: 0.8 10*3/uL (ref 0.7–4.0)
MCHC: 32.8 g/dL (ref 30.0–36.0)
MCV: 96.2 fl (ref 78.0–100.0)
Monocytes Absolute: 0.7 10*3/uL (ref 0.1–1.0)
Monocytes Relative: 11.1 % (ref 3.0–12.0)
Neutro Abs: 4.7 10*3/uL (ref 1.4–7.7)
Neutrophils Relative %: 72.7 % (ref 43.0–77.0)
Platelets: 178 10*3/uL (ref 150.0–400.0)
RBC: 3.07 Mil/uL — ABNORMAL LOW (ref 4.22–5.81)
RDW: 16.6 % — ABNORMAL HIGH (ref 11.5–14.6)
WBC: 6.4 10*3/uL (ref 4.5–10.5)

## 2011-07-26 ENCOUNTER — Other Ambulatory Visit: Payer: Medicare Other

## 2011-07-26 ENCOUNTER — Ambulatory Visit (INDEPENDENT_AMBULATORY_CARE_PROVIDER_SITE_OTHER): Payer: Medicare Other | Admitting: Nurse Practitioner

## 2011-07-26 ENCOUNTER — Encounter: Payer: Self-pay | Admitting: Nurse Practitioner

## 2011-07-26 DIAGNOSIS — R0600 Dyspnea, unspecified: Secondary | ICD-10-CM

## 2011-07-26 DIAGNOSIS — Z79899 Other long term (current) drug therapy: Secondary | ICD-10-CM

## 2011-07-26 DIAGNOSIS — I509 Heart failure, unspecified: Secondary | ICD-10-CM

## 2011-07-26 DIAGNOSIS — I5022 Chronic systolic (congestive) heart failure: Secondary | ICD-10-CM

## 2011-07-26 DIAGNOSIS — I5020 Unspecified systolic (congestive) heart failure: Secondary | ICD-10-CM

## 2011-07-26 DIAGNOSIS — D649 Anemia, unspecified: Secondary | ICD-10-CM

## 2011-07-26 DIAGNOSIS — I4891 Unspecified atrial fibrillation: Secondary | ICD-10-CM

## 2011-07-26 DIAGNOSIS — R0609 Other forms of dyspnea: Secondary | ICD-10-CM

## 2011-07-26 LAB — VITAMIN B12: Vitamin B-12: 379 pg/mL (ref 211–911)

## 2011-07-26 LAB — FERRITIN: Ferritin: 104.6 ng/mL (ref 22.0–322.0)

## 2011-07-26 LAB — FOLATE: Folate: >24.8 ng/mL (ref >5.9)

## 2011-07-26 NOTE — Assessment & Plan Note (Signed)
Has had worsening DOE and swelling. Now noted to be anemic. I suspect this is playing a role. Have discussed with Dr. [place]. Will go ahead and refer to EP for ICD implant. Refer to GI for evaluation as well. We will check an anemia panel today as well. I have left him on his current medicines. Patient is agreeable to this plan and will call if any problems develop in the interim.

## 2011-07-26 NOTE — Progress Notes (Signed)
Jason Choi Date of Birth: 12-Jul-1940 Medical Record #[id number]  History of Present Illness: Jason Choi is seen today for a follow up visit. He is seen for Dr. Swaziland. He has known CAD with a reduced EF. He has not been able to tolerate aldactone or higher doses of Coreg due to orthostasis. He has had recent worsening SOB. He had previously been doing very well following his shoulder surgery but then developed pneumonia and has been slow to recover. We increased his Lasix last week. Labs were checked. He has a worsening anemia of unknown etiology. He is on coumadin. We have updated his echo and his EF remains 35%. Dr. Swaziland wishes to refer on for ICD implant.  He comes back today. Still with some shortness of breath. Still with some edema. Weight is only down a pound. No reports of bleeding. Says his stools are not black. No abdominal pain but does have bloating.   Current Outpatient Prescriptions on File Prior to Visit  Medication Sig Dispense Refill  . amiodarone (PACERONE) 200 MG tablet Take 200 mg by mouth daily.       Marland Kitchen aspirin 81 MG tablet Take 81 mg by mouth daily.       Marland Kitchen atorvastatin (LIPITOR) 40 MG tablet Take 40 mg by mouth daily.       . carvedilol (COREG) 25 MG tablet Take 0.5 tablets (12.5 mg total) by mouth 2 (two) times daily with a meal.  90 tablet  2  . fish oil-omega-3 fatty acids 1000 MG capsule Take 1 g by mouth daily.        . furosemide (LASIX) 40 MG tablet Take 120 mg by mouth daily.       . Multiple Vitamin (MULTIVITAMIN) tablet Take 1 tablet by mouth daily.        . nitroGLYCERIN (NITRODUR - DOSED IN MG/24 HR) 0.4 mg/hr Ad lib.      . ramipril (ALTACE) 10 MG capsule TAKE ONE CAPSULE BY MOUTH DAILY  90 capsule  2  . warfarin (COUMADIN) 5 MG tablet Take 1 tablet (5 mg total) by mouth as directed.  90 tablet  2    Allergies  Allergen Reactions  . Novocain     Unknown reaction    Past Medical History  Diagnosis Date  . S/P CABG (coronary artery bypass graft)  2002    SVG to PDA, Free radial to Intermediate, LIMA to LAD  . HTN (hypertension)   . Hypercholesteremia   . LV dysfunction     EF 35 to 40% per echo May 2012; EF remains 35 to 40% per echo Feb 2013. Referred for ICD  . Chronic anticoagulation     on coumadin  . 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 High risk medication use     on amiodarone  . Atrial fibrillation     ON AMIODARONE: PER VISIT NOTE IN SINUS FIRST DEGREE HB  . CHF (congestive heart failure)   . Pulmonary hypertension, moderate to severe   . Cataract immature     BILATERAL  . Osteoarthritis of right shoulder region     ACUTE PAIN  . Coronary artery disease   . Echocardiogram abnormal 10-10-10  . Rotator cuff tear 2012  . Anemia     Referral to GI Feb 2013    Past Surgical History  Procedure Date  . Right leg surgeries     due to polio  . Left knee arthroscopy 2000  .  Cardiac catheterization 2009    Grafts patent  . Cardiac catheterization 2012    Grafts patent. EF was 25 to 30% but 35 to 40 by echo  . Cardiac catheterization 2003  . Coronary artery bypass graft 2002    X3 VESSEL  . Cardioversion 2008--  FOR PAF    SUCCESSFUL    History  Smoking status  . Former Smoker  . Types: Cigarettes  . Quit date: 05/12/1979  Smokeless tobacco  . Never Used  Comment: stopped smoking over 30 years ago.    History  Alcohol Use  . Yes    occasional beer    Family History  Problem Relation Age of Onset  . Valvular heart disease Father     Review of Systems: The review of systems is per the HPI.  All other systems were reviewed and are negative.  Physical Exam: BP 124/68  Pulse 60  Ht 6\' 3"  (1.905 m)  Wt 254 lb (115.214 kg)  BMI 31.75 kg/m2 Patient is very pleasant and in no acute distress. Skin is warm and dry. Color is normal.  HEENT is unremarkable. Normocephalic/atraumatic. PERRL. Sclera are nonicteric. Neck is supple. No masses. No JVD. Lungs are clear. Cardiac exam shows a regular rate and  rhythm. Abdomen is obese but soft. Extremities are still with edema. Gait and ROM are intact. No gross neurologic deficits noted.    Lab Results  Component Value Date   WBC 6.4 07/25/2011   HGB 9.7* 07/25/2011   HCT 29.6* 07/25/2011   PLT 178.0 07/25/2011   GLUCOSE 104* 07/19/2011   NA 141 07/19/2011   K 4.3 07/19/2011   CL 104 07/19/2011   CREATININE 1.2 07/19/2011   BUN 21 07/19/2011   CO2 27 07/19/2011   TSH 0.63 07/19/2011   INR 2.5 07/07/2011     Assessment / Plan:

## 2011-07-26 NOTE — Patient Instructions (Addendum)
Stay on your current medicines.  We are going to check an anemia panel today.   We are going to need to send you to a GI doctor to evaluate your anemia.  We are going to refer you to the EP doctor for consideration of an ICD.  Call the [facility] [place] office at [phone] if you have any questions, problems or concerns.

## 2011-07-26 NOTE — Assessment & Plan Note (Signed)
Remains in sinus by physical exam. Remains on amiodarone and coumadin.

## 2011-07-27 LAB — IRON AND TIBC
%SAT: 10 % — ABNORMAL LOW (ref 20–55)
Iron: 35 ug/dL — ABNORMAL LOW (ref 42–165)
TIBC: 353 ug/dL (ref 215–435)
UIBC: 318 ug/dL (ref 125–400)

## 2011-07-31 ENCOUNTER — Encounter: Payer: Self-pay | Admitting: Gastroenterology

## 2011-08-04 ENCOUNTER — Ambulatory Visit (INDEPENDENT_AMBULATORY_CARE_PROVIDER_SITE_OTHER): Payer: Medicare Other

## 2011-08-04 DIAGNOSIS — I4891 Unspecified atrial fibrillation: Secondary | ICD-10-CM

## 2011-08-04 LAB — POCT INR: INR: 2.4

## 2011-08-09 ENCOUNTER — Telehealth: Payer: Self-pay | Admitting: Cardiology

## 2011-08-09 ENCOUNTER — Other Ambulatory Visit: Payer: Self-pay

## 2011-08-09 ENCOUNTER — Other Ambulatory Visit: Payer: Self-pay | Admitting: Cardiology

## 2011-08-09 MED ORDER — FUROSEMIDE 40 MG PO TABS: 120.0000 mg | ORAL_TABLET | Freq: Every day | ORAL | Status: DC

## 2011-08-09 NOTE — Telephone Encounter (Signed)
New Msg: Pharmacy calling wanting to speak with nurse/MD to clarify furosemide RX. Please return pharmacy call to discuss further.

## 2011-08-10 ENCOUNTER — Telehealth: Payer: Self-pay | Admitting: Cardiology

## 2011-08-10 NOTE — Telephone Encounter (Signed)
Pharmacist at [facility] [address] called wanting to know if ok to give 90 day supply lasix.States usually gets 90 day supply.Advised ok.

## 2011-08-10 NOTE — Telephone Encounter (Signed)
Please return to patient on [phone]  Patient following Lasix med instructions(Dosage Tripled per Dawayne Patricia & Dr. Swaziland) and still having problem with fluid retention.  Please return call to patient as he is winded, SOB, fatigue.  He can be reached at [facility] # [phone]

## 2011-08-11 ENCOUNTER — Ambulatory Visit (INDEPENDENT_AMBULATORY_CARE_PROVIDER_SITE_OTHER): Payer: Medicare Other | Admitting: Internal Medicine

## 2011-08-11 ENCOUNTER — Encounter: Payer: Self-pay | Admitting: Internal Medicine

## 2011-08-11 VITALS — BP 118/62 | HR 60 | Ht 75.0 in | Wt 254.0 lb

## 2011-08-11 DIAGNOSIS — I509 Heart failure, unspecified: Secondary | ICD-10-CM

## 2011-08-11 DIAGNOSIS — I5022 Chronic systolic (congestive) heart failure: Secondary | ICD-10-CM

## 2011-08-11 DIAGNOSIS — I4891 Unspecified atrial fibrillation: Secondary | ICD-10-CM

## 2011-08-11 DIAGNOSIS — I428 Other cardiomyopathies: Secondary | ICD-10-CM

## 2011-08-11 DIAGNOSIS — I2510 Atherosclerotic heart disease of native coronary artery without angina pectoris: Secondary | ICD-10-CM

## 2011-08-11 NOTE — Assessment & Plan Note (Signed)
The patient has an ischemic CM (EF 35-40% by recent echo), NYHA Class II/III CHF, and CAD.  Based on this EF, he is not presently a candidate for ICD implantation.  Given severe biventricular enlargement, I suspect that this is an overestimation of his EF however, Given his biventricular failure and pulmonary hypertension, I think his prognosis is guarded at best.  He will continue aggressive medical therapy as per Dr Swaziland. I had a long discussion today with the patient today regarding possible ICD implantation.  He is not certain that he would be willing to proceed. I have discussed with Dr Swaziland today.  At this point, I think that we proceed with cardiac MRI.  This would help with further assessment of LV function.  In addition, he has a plan to see GI for iron deficiency anemia.  I would recommend that we reconsider ICD once results of MRI and GI workup are available.

## 2011-08-11 NOTE — Assessment & Plan Note (Signed)
With severe biatrial enlargement, he will almost certain end up with permanent afib.  Fortunately, he has been minimally symptomatic with afib previously.  Continue amiodarone and coumadin for now.

## 2011-08-11 NOTE — Patient Instructions (Signed)
Your physician has requested that you have a cardiac MRI. Cardiac MRI uses a computer to create images of your heart as its beating, producing both still and moving pictures of your heart and major blood vessels. For further information please visit [email or web address]. Please follow the instruction sheet given to you today for more information.  Dr Johney Frame will see you back pending results of your MRI.

## 2011-08-11 NOTE — Assessment & Plan Note (Signed)
No ischemic symptoms Continue medical therapy 

## 2011-08-11 NOTE — Telephone Encounter (Signed)
Patient called, stated his swelling is no better.Continues to have swelling in lower legs and feet.SOB with exertion.Spoke to Norma Fredrickson NP she advised to increase lasix to 80 mg twice daily.Keep appointment with Dr.Stark 08/16/11.

## 2011-08-11 NOTE — Progress Notes (Signed)
Primary Care Physician: Jason Lek, MD, MD Referring Physician:  Dr Jason   Choi Jason Choi is a 71 y.o. male with a h/o CAD, ischemic CM, and chronic systolic dysfunction who presents for EP consultation regarding risk stratification for sudden death.  He underwent CABG 2000-11-22 by Dr Donata Clay.  He was told that he had an "enlarged heart" at that time.  He remained active.  He has been treated aggressively with medical therapy by Dr Jason.  He developed atrial fibrillation for which he required cardioversion.  He has been maintained on amiodarone for rhythm control as well as coumadin for stroke prevention.  He reports that he was minimally symptomatic with afib. He feels that he has had progressive SOB since presenting with pneumonia in January.  He continues to have dypsnea with moderate activity.  He can not walk 100 ft without breathing hard.  He is limited to 1-2 flights of stairs.   + BLE edema.  Today, he denies symptoms of palpitations, chest pain,  dizziness, presyncope, syncope, or neurologic sequela. The patient is tolerating medications without difficulties and is otherwise without complaint today.   Past Medical History  Diagnosis Date  . S/P CABG (coronary artery bypass graft) November 22, 2000    SVG to PDA, Free radial to Intermediate, LIMA to LAD by Dr Donata Clay  . HTN (hypertension)   . Hypercholesteremia   . LV dysfunction     EF 35 to 40% per echo 2010-11-23; EF remains 35 to 40% per echo Feb 2013. Referred for ICD  . Chronic anticoagulation     on coumadin  . 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 High risk medication use     on amiodarone  . Atrial fibrillation     ON AMIODARONE: PER VISIT NOTE IN SINUS FIRST DEGREE HB  . Chronic systolic dysfunction of left ventricle   . Pulmonary hypertension, moderate to severe   . Cataract immature     BILATERAL  . Osteoarthritis of right shoulder region     ACUTE PAIN  . Echocardiogram abnormal 10-10-10  . Rotator cuff tear 11-23-2010  .  Anemia     Referral to GI Feb 2013   Past Surgical History  Procedure Date  . Right leg surgeries     due to polio  . Left knee arthroscopy November 23, 1998  . Cardiac catheterization 23-Nov-2007    Grafts patent  . Cardiac catheterization 11-23-2010    Grafts patent. EF was 25 to 30% but 35 to 40 by echo  . Cardiac catheterization Nov 22, 2001  . Coronary artery bypass graft 2002    X3 VESSEL  . Cardioversion 11/23/2006--  FOR PAF    SUCCESSFUL  . Right shoulder rotator cuffe repair 05/16/11    Current Outpatient Prescriptions  Medication Sig Dispense Refill  . amiodarone (PACERONE) 200 MG tablet Take 200 mg by mouth daily.       Marland Kitchen aspirin 81 MG tablet Take 81 mg by mouth daily.       Marland Kitchen atorvastatin (LIPITOR) 40 MG tablet Take 40 mg by mouth daily.       . carvedilol (COREG) 25 MG tablet Take 0.5 tablets (12.5 mg total) by mouth 2 (two) times daily with a meal.  90 tablet  2  . fish oil-omega-3 fatty acids 1000 MG capsule Take 1 g by mouth daily.        . furosemide (LASIX) 40 MG tablet Take 3 tablets (120 mg total) by mouth daily.  30 tablet  6  . Multiple Vitamin (MULTIVITAMIN) tablet Take 1 tablet by mouth daily.        . nitroGLYCERIN (NITRODUR - DOSED IN MG/24 HR) 0.4 mg/hr Ad lib.      . ramipril (ALTACE) 10 MG capsule TAKE ONE CAPSULE BY MOUTH DAILY  90 capsule  2  . warfarin (COUMADIN) 5 MG tablet Take 1 tablet (5 mg total) by mouth as directed.  90 tablet  2    Allergies  Allergen Reactions  . Novocain     Unknown reaction    History   Social History  . Marital Status: Married    Spouse Name: N/A    Number of Children: N/A  . Years of Education: N/A   Occupational History  .  [employer]   Social History Main Topics  . Smoking status: Former Smoker    Types: Cigarettes    Quit date: 05/12/1979  . Smokeless tobacco: Never Used   Comment: stopped smoking over 30 years ago.  . Alcohol Use: Yes     couple beers and a shot of [place] royal most days  . Drug Use: No  . Sexually Active: Not on  file   Other Topics Concern  . Not on file   Social History Narrative   Pt lives in [place] with spouse.  1 grown healthy child.Retired 2003 from [employer].  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    Family History  Problem Relation Age of Onset  . Valvular heart disease Father     ROS- All systems are reviewed and negative except as per the HPI above  Physical Exam: Filed Vitals:   08/11/11 1512  BP: 118/62  Pulse: 60  Height: 6\' 3"  (1.905 m)  Weight: 254 lb (115.214 kg)    GEN- The patient is well appearing, alert and oriented x 3 today.   Head- normocephalic, atraumatic Eyes-  Sclera clear, conjunctiva pink Ears- hearing intact Oropharynx- clear Neck- supple, no JVP Lymph- no cervical lymphadenopathy Lungs- Clear to ausculation bilaterally, normal work of breathing Heart- Regular rate and rhythm, no murmurs, rubs or gallops, PMI not laterally displaced GI- soft, NT, ND, + BS Extremities- no clubbing, cyanosis, 2+ R>L edema MS- no significant deformity or atrophy Skin- no rash or lesion Psych- euthymic mood, full affect Neuro- strength and sensation are intact  EKG 07/19/11- sinus 60 bpm, PR 206, QRS 126, QTc 484  Echo 07/25/11- severe biventricular enlargement, LVEF 35-40%, LVEDD 68, LA 63 with severe RA enlargement, significant pulmonary hypertension  Assessment and Plan:

## 2011-08-16 ENCOUNTER — Encounter: Payer: Self-pay | Admitting: Gastroenterology

## 2011-08-16 ENCOUNTER — Ambulatory Visit (INDEPENDENT_AMBULATORY_CARE_PROVIDER_SITE_OTHER): Payer: Medicare Other | Admitting: Gastroenterology

## 2011-08-16 VITALS — BP 120/66 | HR 60 | Ht 75.0 in | Wt 255.0 lb

## 2011-08-16 DIAGNOSIS — D509 Iron deficiency anemia, unspecified: Secondary | ICD-10-CM

## 2011-08-16 DIAGNOSIS — Z7901 Long term (current) use of anticoagulants: Secondary | ICD-10-CM

## 2011-08-16 MED ORDER — PEG-KCL-NACL-NASULF-NA ASC-C 100 G PO SOLR: 1.0000 | Freq: Once | ORAL | Status: DC

## 2011-08-16 NOTE — Progress Notes (Signed)
History of Present Illness: This is a 71 year old male with coronary artery disease, atrial fibrillation on chronic Coumadin, congestive heart failure, hypertension and hyperlipidemia. He relates problems with shortness of breath and fluid retention since January. He has heart failure with an EF of 35-40 %. He was recently found to have an iron deficiency anemia with a Hb=9.7 and iron saturation of 10%. He states he previously underwent colonoscopy by Dr. Jeani Hawking in 2005 and does not recall any findings on that examination. Denies weight loss, abdominal pain, constipation, diarrhea, change in stool caliber, melena, hematochezia, nausea, vomiting, dysphagia, reflux symptoms, chest pain.  Review of Systems: Pertinent positive and negative review of systems were noted in the above HPI section. All other review of systems were otherwise negative.  Current Medications, Allergies, Past Medical History, Past Surgical History, Family History and Social History were reviewed in [employer] record.  Physical Exam: General: Well developed , well nourished, obese, no acute distress Head: Normocephalic and atraumatic Eyes:  sclerae anicteric, EOMI Ears: Normal auditory acuity Mouth: No deformity or lesions Neck: Supple, no masses or thyromegaly Lungs: Clear throughout to auscultation Heart: Regular rate and rhythm; no murmurs, rubs or bruits Abdomen: Soft, non tender and non distended. No masses, hepatosplenomegaly or hernias noted. Normal Bowel sounds Rectal: Deferred to colonoscopy. Musculoskeletal: Symmetrical with no gross deformities  Skin: No lesions on visible extremities Pulses:  Normal pulses noted Extremities: No clubbing, cyanosis, or deformities noted. 2-3+ pedal and pretibial edema Neurological: Alert oriented x 4, grossly nonfocal Cervical Nodes:  No significant cervical adenopathy Inguinal Nodes: No significant inguinal adenopathy Psychological:  Alert and  cooperative. Normal mood and affect  Assessment and Recommendations:  1. Iron deficiency anemia without GI symptoms. Rule out colorectal neoplasms, AVMs ulcers disease and other disorders that might be leading to chronic GI blood loss. Chronic Coumadin anticoagulation for atrial fibrillation and congestive heart failure placed in a slightly higher risk of cardiovascular and bleeding complications from colonoscopy, upper endoscopy and sedation. Obtain stool Hemoccults. Schedule colonoscopy and upper endoscopy with propofol. The risks, benefits and alternatives to a five-day hold Coumadin were discussed with the patient and he consents to proceed. Obtain clearance from Dr. Swaziland. The risks, benefits, and alternatives to colonoscopy with possible biopsy and possible polypectomy were discussed with the patient and they consent to proceed. The risks, benefits, and alternatives to endoscopy with possible biopsy and possible dilation were discussed with the patient and they consent to proceed.

## 2011-08-16 NOTE — Patient Instructions (Signed)
You have been scheduled for an endoscopy and colonoscopy with propofol. Please follow the written instructions given to you at your visit today. Please pick up your prep at the pharmacy within the next 1-3 days. You will be contaced by our office prior to your procedure for directions on holding your Coumadin/Warfarin.  If you do not hear from our office 1 week prior to your scheduled procedure, please call [phone] to discuss. cc: Peter Swaziland, MD       Marjory Lies, MD

## 2011-08-31 ENCOUNTER — Other Ambulatory Visit: Payer: Medicare Other

## 2011-08-31 DIAGNOSIS — D509 Iron deficiency anemia, unspecified: Secondary | ICD-10-CM

## 2011-08-31 LAB — HEMOCCULT SLIDES (X 3 CARDS)
OCCULT 1: NEGATIVE
OCCULT 3: NEGATIVE
OCCULT 4: NEGATIVE
OCCULT 5: NEGATIVE

## 2011-09-01 ENCOUNTER — Other Ambulatory Visit: Payer: Self-pay | Admitting: *Deleted

## 2011-09-01 ENCOUNTER — Ambulatory Visit (INDEPENDENT_AMBULATORY_CARE_PROVIDER_SITE_OTHER): Payer: Medicare Other | Admitting: *Deleted

## 2011-09-01 ENCOUNTER — Telehealth: Payer: Self-pay | Admitting: Gastroenterology

## 2011-09-01 ENCOUNTER — Other Ambulatory Visit: Payer: Self-pay | Admitting: Nurse Practitioner

## 2011-09-01 ENCOUNTER — Other Ambulatory Visit (INDEPENDENT_AMBULATORY_CARE_PROVIDER_SITE_OTHER): Payer: Medicare Other

## 2011-09-01 DIAGNOSIS — I4891 Unspecified atrial fibrillation: Secondary | ICD-10-CM

## 2011-09-01 DIAGNOSIS — R0600 Dyspnea, unspecified: Secondary | ICD-10-CM

## 2011-09-01 DIAGNOSIS — I509 Heart failure, unspecified: Secondary | ICD-10-CM

## 2011-09-01 DIAGNOSIS — I10 Essential (primary) hypertension: Secondary | ICD-10-CM

## 2011-09-01 DIAGNOSIS — E7800 Pure hypercholesterolemia, unspecified: Secondary | ICD-10-CM

## 2011-09-01 DIAGNOSIS — R0602 Shortness of breath: Secondary | ICD-10-CM

## 2011-09-01 DIAGNOSIS — I5022 Chronic systolic (congestive) heart failure: Secondary | ICD-10-CM

## 2011-09-01 DIAGNOSIS — I5020 Unspecified systolic (congestive) heart failure: Secondary | ICD-10-CM

## 2011-09-01 DIAGNOSIS — I2510 Atherosclerotic heart disease of native coronary artery without angina pectoris: Secondary | ICD-10-CM

## 2011-09-01 DIAGNOSIS — Z7901 Long term (current) use of anticoagulants: Secondary | ICD-10-CM

## 2011-09-01 LAB — BASIC METABOLIC PANEL
BUN: 32 mg/dL — ABNORMAL HIGH (ref 6–23)
CO2: 27 mEq/L (ref 19–32)
Calcium: 8.8 mg/dL (ref 8.4–10.5)
Chloride: 107 mEq/L (ref 96–112)
Creatinine, Ser: 1.7 mg/dL — ABNORMAL HIGH (ref 0.4–1.5)
GFR: 41.85 mL/min — ABNORMAL LOW (ref >60.00)
Glucose, Bld: 150 mg/dL — ABNORMAL HIGH (ref 70–99)
Potassium: 3.3 mEq/L — ABNORMAL LOW (ref 3.5–5.1)
Sodium: 142 mEq/L (ref 135–145)

## 2011-09-01 LAB — CBC WITH DIFFERENTIAL/PLATELET
Basophils Absolute: 0 10*3/uL (ref 0.0–0.1)
Basophils Relative: 0.6 % (ref 0.0–3.0)
Eosinophils Absolute: 0.2 10*3/uL (ref 0.0–0.7)
Eosinophils Relative: 3.2 % (ref 0.0–5.0)
HCT: 28.4 % — ABNORMAL LOW (ref 39.0–52.0)
Hemoglobin: 9.2 g/dL — ABNORMAL LOW (ref 13.0–17.0)
Lymphocytes Relative: 9.5 % — ABNORMAL LOW (ref 12.0–46.0)
Lymphs Abs: 0.6 10*3/uL — ABNORMAL LOW (ref 0.7–4.0)
MCHC: 32.5 g/dL (ref 30.0–36.0)
MCV: 89.1 fl (ref 78.0–100.0)
Monocytes Absolute: 0.5 10*3/uL (ref 0.1–1.0)
Monocytes Relative: 9.2 % (ref 3.0–12.0)
Neutro Abs: 4.6 10*3/uL (ref 1.4–7.7)
Neutrophils Relative %: 77.5 % — ABNORMAL HIGH (ref 43.0–77.0)
Platelets: 176 10*3/uL (ref 150.0–400.0)
RBC: 3.19 Mil/uL — ABNORMAL LOW (ref 4.22–5.81)
RDW: 16.4 % — ABNORMAL HIGH (ref 11.5–14.6)
WBC: 5.9 10*3/uL (ref 4.5–10.5)

## 2011-09-01 LAB — BRAIN NATRIURETIC PEPTIDE: Pro B Natriuretic peptide (BNP): 733 pg/mL — ABNORMAL HIGH (ref 0.0–100.0)

## 2011-09-01 NOTE — Progress Notes (Signed)
Addended by: Rosalio Macadamia on: 09/01/2011 07:49 AM   Modules accepted: Orders

## 2011-09-01 NOTE — Telephone Encounter (Signed)
Patient is needing Propofol for his procedure and no openings prior to 09/20/11.  Per Dr Swaziland it is ok to hold his coumadin for 5 days.  I will call if there are any changes and a propofol day becomes available.

## 2011-09-05 ENCOUNTER — Telehealth: Payer: Self-pay | Admitting: Cardiology

## 2011-09-05 MED ORDER — METOLAZONE 2.5 MG PO TABS: ORAL_TABLET | ORAL | Status: DC

## 2011-09-05 NOTE — Telephone Encounter (Signed)
Patient called, stated swelling is worse in lower legs, goes up to waist.States not sleeping at night due to sob.Spoke to Norma Fredrickson NP she advised take zaroxolyn 2.5 mg every day for 3 days only.Advised if not better go to ER.

## 2011-09-05 NOTE — Telephone Encounter (Signed)
New msg Pt called to say the lasix he has been taking is not getting rid of fluid. He has been having some sob and legs are swollen. Please call

## 2011-09-14 ENCOUNTER — Telehealth: Payer: Self-pay | Admitting: Cardiology

## 2011-09-14 NOTE — Telephone Encounter (Signed)
Patient called advised ok with Dr.Jordan to hold coumadin 5 days prior to endo and colonoscopy.

## 2011-09-14 NOTE — Telephone Encounter (Signed)
New Problem:     Patient called in because he has an Endoscopy and a Colonoscopy next Wed. And wanted to know if he could stop his coumadin five days prior.  Please call back.

## 2011-09-20 ENCOUNTER — Encounter: Payer: Self-pay | Admitting: Gastroenterology

## 2011-09-20 ENCOUNTER — Ambulatory Visit (AMBULATORY_SURGERY_CENTER): Payer: Medicare Other | Admitting: Gastroenterology

## 2011-09-20 VITALS — BP 129/85 | HR 70 | Temp 98.0°F | Resp 18 | Ht 75.0 in | Wt 255.0 lb

## 2011-09-20 DIAGNOSIS — K229 Disease of esophagus, unspecified: Secondary | ICD-10-CM

## 2011-09-20 DIAGNOSIS — D1330 Benign neoplasm of unspecified part of small intestine: Secondary | ICD-10-CM

## 2011-09-20 DIAGNOSIS — Z7901 Long term (current) use of anticoagulants: Secondary | ICD-10-CM

## 2011-09-20 DIAGNOSIS — D509 Iron deficiency anemia, unspecified: Secondary | ICD-10-CM

## 2011-09-20 DIAGNOSIS — K2270 Barrett's esophagus without dysplasia: Secondary | ICD-10-CM

## 2011-09-20 MED ORDER — SODIUM CHLORIDE 0.9 % IV SOLN: 500.0000 mL | INTRAVENOUS | Status: DC

## 2011-09-20 NOTE — Op Note (Signed)
[facility] [address] [place], [place]  [address]  COLONOSCOPY PROCEDURE REPORT  PATIENT:  Jason, Choi  MR#:  [id number] BIRTHDATE:  August 22, 1940, 70 yrs. old  GENDER:  male ENDOSCOPIST:  Judie Petit T. Russella Dar, MD, [facility] Referred by:  Marjory Lies, M.D. PROCEDURE DATE:  09/20/2011 PROCEDURE:  Colonoscopy 19147 ASA CLASS:  Class III INDICATIONS:  1) Iron deficiency anemia MEDICATIONS:   MAC sedation, administered by CRNA, propofol (Diprivan) 200 mg IV DESCRIPTION OF PROCEDURE:   After the risks benefits and alternatives of the procedure were thoroughly explained, informed consent was obtained.  Digital rectal exam was performed and revealed no abnormalities.   The LB [id number] endoscope was introduced through the anus and advanced to the cecum, which was identified by both the appendix and ileocecal valve, without limitations.  The quality of the prep was good, using MoviPrep. The instrument was then slowly withdrawn as the colon was fully examined. <<PROCEDUREIMAGES>> FINDINGS:  A normal appearing cecum, ileocecal valve, and appendiceal orifice were identified. The ascending, hepatic flexure, transverse, splenic flexure, descending, sigmoid colon, and rectum appeared unremarkable.   Retroflexed views in the rectum revealed internal hemorrhoids, small.  The time to cecum = 2.33  minutes. The scope was then withdrawn (time =  9.75  min) from the patient and the procedure completed.  COMPLICATIONS:  None  ENDOSCOPIC IMPRESSION: 1) Normal colon 2) Internal hemorrhoids  RECOMMENDATIONS: 1) Resume Coumadin (warfarin) today and have your PT/INR checked within 1 week. 2) Continue current colorectal screening for "routine risk" patients with a repeat colonoscopy in 10 years.  Jason Lick. Russella Dar, MD, Jason Choi  n. eSIGNED:   Venita Lick. Jason Choi at 09/20/2011 03:03 PM  Marolyn Hammock, [phone]

## 2011-09-20 NOTE — Progress Notes (Signed)
Patient did not experience any of the following events: a burn prior to discharge; a fall within the facility; wrong site/side/patient/procedure/implant event; or a hospital transfer or hospital admission upon discharge from the facility. (G8907) Patient did not have preoperative order for IV antibiotic SSI prophylaxis. (G8918)  

## 2011-09-20 NOTE — Patient Instructions (Signed)
Discharge instructions given with verbal understanding. Handouts on hemorrhoids and a high fiber diet given. Resume previous medications. YOU HAD AN ENDOSCOPIC PROCEDURE TODAY AT THE [facility]: Refer to the procedure report that was given to you for any specific questions about what was found during the examination.  If the procedure report does not answer your questions, please call your gastroenterologist to clarify.  If you requested that your care partner not be given the details of your procedure findings, then the procedure report has been included in a sealed envelope for you to review at your convenience later.  YOU SHOULD EXPECT: Some feelings of bloating in the abdomen. Passage of more gas than usual.  Walking can help get rid of the air that was put into your GI tract during the procedure and reduce the bloating. If you had a lower endoscopy (such as a colonoscopy or flexible sigmoidoscopy) you may notice spotting of blood in your stool or on the toilet paper. If you underwent a bowel prep for your procedure, then you may not have a normal bowel movement for a few days.  DIET: Your first meal following the procedure should be a light meal and then it is ok to progress to your normal diet.  A half-sandwich or bowl of soup is an example of a good first meal.  Heavy or fried foods are harder to digest and may make you feel nauseous or bloated.  Likewise meals heavy in dairy and vegetables can cause extra gas to form and this can also increase the bloating.  Drink plenty of fluids but you should avoid alcoholic beverages for 24 hours.  ACTIVITY: Your care partner should take you home directly after the procedure.  You should plan to take it easy, moving slowly for the rest of the day.  You can resume normal activity the day after the procedure however you should NOT DRIVE or use heavy machinery for 24 hours (because of the sedation medicines used during the test).    SYMPTOMS TO  REPORT IMMEDIATELY: A gastroenterologist can be reached at any hour.  During normal business hours, 8:30 AM to 5:00 PM Monday through Friday, call [phone].  After hours and on weekends, please call the GI answering service at [phone] who will take a message and have the physician on call contact you.   Following lower endoscopy (colonoscopy or flexible sigmoidoscopy):  Excessive amounts of blood in the stool  Significant tenderness or worsening of abdominal pains  Swelling of the abdomen that is new, acute  Fever of 100F or higher FOLLOW UP: If any biopsies were taken you will be contacted by phone or by letter within the next 1-3 weeks.  Call your gastroenterologist if you have not heard about the biopsies in 3 weeks.  Our staff will call the home number listed on your records the next business day following your procedure to check on you and address any questions or concerns that you may have at that time regarding the information given to you following your procedure. This is a courtesy call and so if there is no answer at the home number and we have not heard from you through the emergency physician on call, we will assume that you have returned to your regular daily activities without incident.  SIGNATURES/CONFIDENTIALITY: You and/or your care partner have signed paperwork which will be entered into your electronic medical record.  These signatures attest to the fact that that the information above on your   After Visit Summary has been reviewed and is understood.  Full responsibility of the confidentiality of this discharge information lies with you and/or your care-partner.  

## 2011-09-20 NOTE — Op Note (Signed)
[facility] [address] [place], [place]  [address]  ENDOSCOPY PROCEDURE REPORT  PATIENT:  Jason, Choi  MR#:  [id number] BIRTHDATE:  [birth year], 70 yrs. old  GENDER:  male ENDOSCOPIST:  Judie Petit T. Russella Dar, MD, [facility] Referred by:  Marjory Lies, M.D. PROCEDURE DATE:  09/20/2011 PROCEDURE:  EGD with biopsy, 43239 ASA CLASS:  Class III INDICATIONS:  iron deficiency anemia MEDICATIONS:  MAC sedation, administered by CRNA, There was residual sedation effect present from prior procedure., propofol (Diprivan) 150 mg IV TOPICAL ANESTHETIC:  none DESCRIPTION OF PROCEDURE:   After the risks benefits and alternatives of the procedure were thoroughly explained, informed consent was obtained.  The LB GIF-H180 [id number] endoscope was introduced through the mouth and advanced to the second portion of the duodenum, without limitations.  The instrument was slowly withdrawn as the mucosa was fully examined. <<PROCEDUREIMAGES>> Irregular Z-line at the gastroesophageal junction. Multiple biopsies were obtained and sent to pathology.  Otherwise normal esophagus.  The stomach was entered and closely examined. The pylorus, antrum, angularis, and lesser curvature were well visualized, including a retroflexed view of the cardia and fundus. The stomach wall was normally distensable. The scope passed easily through the pylorus into the duodenum.  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 It was 2 mm and non bleeding. Otherwise normal duodenum. Random biopsies were obtained and sent to pathology.  Retroflexed views revealed no abnormalities.    The scope was then withdrawn from the patient and the procedure completed.  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 Russella Dar, MD, [facility]  n. eSIGNED:   Venita Lick. Puneet Selden at  09/20/2011 03:22 PM  Marolyn Hammock, [phone]

## 2011-09-21 ENCOUNTER — Telehealth: Payer: Self-pay

## 2011-09-21 NOTE — Telephone Encounter (Signed)
  Follow up Call-  Call back number 09/20/2011  Post procedure Call Back phone  # [phone]  Permission to leave phone message Yes     Patient questions:  Do you have a fever, pain , or abdominal swelling? no Pain Score  0 *  Have you tolerated food without any problems? yes  Have you been able to return to your normal activities? yes  Do you have any questions about your discharge instructions: Diet   no Medications  no Follow up visit  no  Do you have questions or concerns about your Care? no  Actions: * If pain score is 4 or above: No action needed, pain <4.

## 2011-09-25 ENCOUNTER — Encounter: Payer: Self-pay | Admitting: Gastroenterology

## 2011-09-27 ENCOUNTER — Ambulatory Visit (INDEPENDENT_AMBULATORY_CARE_PROVIDER_SITE_OTHER): Payer: Medicare Other | Admitting: *Deleted

## 2011-09-27 ENCOUNTER — Ambulatory Visit (INDEPENDENT_AMBULATORY_CARE_PROVIDER_SITE_OTHER): Payer: Medicare Other | Admitting: Pharmacist

## 2011-09-27 DIAGNOSIS — I2510 Atherosclerotic heart disease of native coronary artery without angina pectoris: Secondary | ICD-10-CM

## 2011-09-27 DIAGNOSIS — I4891 Unspecified atrial fibrillation: Secondary | ICD-10-CM

## 2011-09-27 DIAGNOSIS — I10 Essential (primary) hypertension: Secondary | ICD-10-CM

## 2011-09-27 LAB — BASIC METABOLIC PANEL
BUN: 32 mg/dL — ABNORMAL HIGH (ref 6–23)
Calcium: 9.1 mg/dL (ref 8.4–10.5)
Creatinine, Ser: 1.5 mg/dL (ref 0.4–1.5)
GFR: 49.33 mL/min — ABNORMAL LOW (ref >60.00)
Glucose, Bld: 120 mg/dL — ABNORMAL HIGH (ref 70–99)
Potassium: 4 mEq/L (ref 3.5–5.1)

## 2011-10-18 ENCOUNTER — Ambulatory Visit (INDEPENDENT_AMBULATORY_CARE_PROVIDER_SITE_OTHER): Payer: Medicare Other | Admitting: *Deleted

## 2011-10-18 DIAGNOSIS — I4891 Unspecified atrial fibrillation: Secondary | ICD-10-CM

## 2011-10-18 LAB — POCT INR: INR: 3.7

## 2011-11-09 ENCOUNTER — Ambulatory Visit (INDEPENDENT_AMBULATORY_CARE_PROVIDER_SITE_OTHER): Payer: Medicare Other | Admitting: *Deleted

## 2011-11-09 DIAGNOSIS — I4891 Unspecified atrial fibrillation: Secondary | ICD-10-CM

## 2011-11-10 ENCOUNTER — Other Ambulatory Visit: Payer: Self-pay | Admitting: Cardiology

## 2011-11-27 ENCOUNTER — Ambulatory Visit (INDEPENDENT_AMBULATORY_CARE_PROVIDER_SITE_OTHER): Payer: Medicare Other | Admitting: Pharmacist

## 2011-11-27 ENCOUNTER — Encounter: Payer: Self-pay | Admitting: Nurse Practitioner

## 2011-11-27 ENCOUNTER — Ambulatory Visit (INDEPENDENT_AMBULATORY_CARE_PROVIDER_SITE_OTHER): Payer: Medicare Other | Admitting: Nurse Practitioner

## 2011-11-27 VITALS — BP 116/70 | HR 60 | Ht 75.0 in | Wt 258.0 lb

## 2011-11-27 DIAGNOSIS — I5020 Unspecified systolic (congestive) heart failure: Secondary | ICD-10-CM

## 2011-11-27 DIAGNOSIS — I5022 Chronic systolic (congestive) heart failure: Secondary | ICD-10-CM

## 2011-11-27 DIAGNOSIS — R0600 Dyspnea, unspecified: Secondary | ICD-10-CM

## 2011-11-27 DIAGNOSIS — I509 Heart failure, unspecified: Secondary | ICD-10-CM

## 2011-11-27 DIAGNOSIS — D649 Anemia, unspecified: Secondary | ICD-10-CM

## 2011-11-27 DIAGNOSIS — I4891 Unspecified atrial fibrillation: Secondary | ICD-10-CM

## 2011-11-27 DIAGNOSIS — R0609 Other forms of dyspnea: Secondary | ICD-10-CM

## 2011-11-27 MED ORDER — METOLAZONE 2.5 MG PO TABS: ORAL_TABLET | ORAL | Status: DC

## 2011-11-27 NOTE — Patient Instructions (Addendum)
We are going to check labs today and check your coumadin level today  We are going to arrange for this cardiac MRI  We need to send you to the hematologist and get your anemia corrected since your GI evaluation was ok  I am going to give you 3 days of Zaroxolyn to help with the fluid  Stay on the other medicines

## 2011-11-27 NOTE — Progress Notes (Signed)
Jason Choi Date of Birth: 1940-09-13 Medical Record #[id number]  History of Present Illness: Jason Choi is seen today for a work in visit. He is seen for Dr. Swazil[place]. He has known CAD with a reduced EF. He has not tolerated aldactone or higher doses of Creg due to orthostasis. He had his CABG back in 2002. He is on amiodarone for his PAF as well as his coumadin. Has pulmonary HTN as well. He has had recurrent pneumonia this year. He is also anemic and has had a GI evaluation that showed no evidence of bleeding per Jason Choi's report. He is on an iron pill. He was seen by EP back in March for consideration of an ICD. His repeat echo showed an EF of 35 to 40% and given the severe biventricular enlargement, Dr. Johney Frame though it was an overestimation. Dr. Johney Frame had wanted to proceed on with an MRI. This has not been completed.   He comes in today. He continues to get short of breath with just very little activity. He feels bad. Has more swelling. Weight is up. Some cough.   Current Outpatient Prescriptions on File Prior to Visit  Medication Sig Dispense Refill  . amiodarone (PACERONE) 200 MG tablet Take 200 mg by mouth daily.       Marland Kitchen aspirin 81 MG tablet Take 81 mg by mouth daily.       Marland Kitchen atorvastatin (LIPITOR) 40 MG tablet Take 40 mg by mouth daily.       . carvedilol (COREG) 25 MG tablet Take 0.5 tablets (12.5 mg total) by mouth 2 (two) times daily with a meal.  90 tablet  2  . fish oil-omega-3 fatty acids 1000 MG capsule Take 1 g by mouth daily.        . furosemide (LASIX) 40 MG tablet Take 3 tablets (120 mg total) by mouth daily.  30 tablet  6  . Multiple Vitamin (MULTIVITAMIN) tablet Take 1 tablet by mouth daily.        . ramipril (ALTACE) 10 MG capsule TAKE ONE CAPSULE BY MOUTH DAILY  90 capsule  2  . warfarin (COUMADIN) 5 MG tablet Take 1 tablet (5 mg total) by mouth as directed.  90 tablet  2    Allergies  Allergen Reactions  . Procaine Hcl     Unknown reaction    Past Medical History    Diagnosis Date  . S/P CABG (coronary artery bypass graft) 2002    SVG to PDA, Free radial to Intermediate, LIMA to LAD by Dr Donata Clay  . HTN (hypertension)   . Hypercholesteremia   . LV dysfunction     EF 35 to 40% per echo May 2012; EF remains 35 to 40% per echo Feb 2013. Referred for ICD  . Chronic anticoagulation     on coumadin  . 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 High risk medication use     on amiodarone  . Atrial fibrillation     ON AMIODARONE: PER VISIT NOTE IN [facility] HB  . Chronic systolic dysfunction of left ventricle   . Pulmonary hypertension, moderate to severe   . Cataract immature     BILATERAL  . Osteoarthritis of right shoulder region     ACUTE PAIN  . Echocardiogram abnormal 10-10-10  . Rotator cuff tear 2012  . Anemia     Referral to GI Feb 2013    Past Surgical History  Procedure Date  . Right leg  surgeries     due to polio  . Left knee arthroscopy 2000  . Cardiac catheterization 2009    Grafts patent  . Cardiac catheterization 2012    Grafts patent. EF was 25 to 30% but 35 to 40 by echo  . Cardiac catheterization 2003  . Coronary artery bypass graft 2002    X3 VESSEL  . Cardioversion 2008--  FOR PAF    SUCCESSFUL  . Right shoulder rotator cuffe repair 05/16/11    History  Smoking status  . Former Smoker  . Types: Cigarettes  . Quit date: 05/12/1979  Smokeless tobacco  . Never Used  Comment: stopped smoking over 30 years ago.    History  Alcohol Use  . 8.4 oz/week  . 14 Cans of beer per week    couple beers and a shot of crown royal most days    Family History  Problem Relation Age of Onset  . Valvular heart disease Father   . Colon cancer Neg Hx   . Colon polyps Neg Hx   . Rectal cancer Neg Hx   . Stomach cancer Neg Hx     Review of Systems: The review of systems is per the HPI.  All other systems were reviewed and are negative.  Physical Exam: BP 116/70  Pulse 60  Ht 6\' 3"  (1.905 m)  Wt 258 lb  (117.028 kg)  BMI 32.25 kg/m2  SpO2 90% Patient is very pleasant and in no acute distress. He is of large build and is obese. Skin is warm and dry. Color is normal.  HEENT is unremarkable. Normocephalic/atraumatic. PERRL. Sclera are nonicteric. Neck is supple. No masses. No JVD. Lungs are clear. Cardiac exam shows a regular rate and rhythm. Abdomen is obese but soft. Extremities are with 2+ edema bilaterally. Gait and ROM are intact. No gross neurologic deficits noted.  LABORATORY DATA: PENDING   Assessment / Plan:

## 2011-11-27 NOTE — Assessment & Plan Note (Signed)
Patient presents with worsening heart failure symptoms. He reports having a good response to Zaroxolyn in the past. We will repeat this for 3 days at 2.5 mg along with his Lasix. We are checking labs today. We will arrange for his cardiac MRI and then reconsider ICD implant.  I have also referred him to hematology to help with his anemia. He may require IV iron or Aransep. He may require pulmonary referral as well. I will ask Dr. Swaziland to review for other suggestions. Could consider CHF referral if needed. He has not tolerated higher doses of Coreg or aldactone in the past.  I will see him back after his studies are complete. Patient is agreeable to this plan and will call if any problems develop in the interim.

## 2011-11-27 NOTE — Addendum Note (Signed)
Addended by: Vista Mink D on: 11/27/2011 03:48 PM   Modules accepted: Orders

## 2011-11-28 ENCOUNTER — Encounter: Payer: Self-pay | Admitting: Internal Medicine

## 2011-11-28 LAB — CBC WITH DIFFERENTIAL/PLATELET
Basophils Absolute: 0.1 10*3/uL (ref 0.0–0.1)
Basophils Relative: 1.6 % (ref 0.0–3.0)
Eosinophils Absolute: 0.3 10*3/uL (ref 0.0–0.7)
Eosinophils Relative: 4 % (ref 0.0–5.0)
HCT: 29.5 % — ABNORMAL LOW (ref 39.0–52.0)
Hemoglobin: 9.5 g/dL — ABNORMAL LOW (ref 13.0–17.0)
Lymphocytes Relative: 12 % (ref 12.0–46.0)
Lymphs Abs: 0.8 10*3/uL (ref 0.7–4.0)
MCHC: 32.1 g/dL (ref 30.0–36.0)
MCV: 84.3 fl (ref 78.0–100.0)
Monocytes Absolute: 0.9 10*3/uL (ref 0.1–1.0)
Monocytes Relative: 14.3 % — ABNORMAL HIGH (ref 3.0–12.0)
Neutro Abs: 4.4 10*3/uL (ref 1.4–7.7)
Neutrophils Relative %: 68.1 % (ref 43.0–77.0)
Platelets: 187 10*3/uL (ref 150.0–400.0)
RBC: 3.5 Mil/uL — ABNORMAL LOW (ref 4.22–5.81)
RDW: 19.8 % — ABNORMAL HIGH (ref 11.5–14.6)
WBC: 6.5 10*3/uL (ref 4.5–10.5)

## 2011-11-28 LAB — BASIC METABOLIC PANEL
BUN: 34 mg/dL — ABNORMAL HIGH (ref 6–23)
CO2: 26 mEq/L (ref 19–32)
Calcium: 8.8 mg/dL (ref 8.4–10.5)
Chloride: 103 mEq/L (ref 96–112)
Creatinine, Ser: 1.7 mg/dL — ABNORMAL HIGH (ref 0.4–1.5)
GFR: 42.1 mL/min — ABNORMAL LOW (ref >60.00)
Glucose, Bld: 107 mg/dL — ABNORMAL HIGH (ref 70–99)
Potassium: 4.3 mEq/L (ref 3.5–5.1)
Sodium: 138 mEq/L (ref 135–145)

## 2011-11-28 LAB — BRAIN NATRIURETIC PEPTIDE: Pro B Natriuretic peptide (BNP): 1377 pg/mL — ABNORMAL HIGH (ref 0.0–100.0)

## 2011-12-01 ENCOUNTER — Telehealth: Payer: Self-pay | Admitting: Oncology

## 2011-12-01 ENCOUNTER — Other Ambulatory Visit: Payer: Self-pay | Admitting: *Deleted

## 2011-12-01 MED ORDER — FUROSEMIDE 40 MG PO TABS: 120.0000 mg | ORAL_TABLET | Freq: Every day | ORAL | Status: DC

## 2011-12-01 NOTE — Telephone Encounter (Signed)
Fax Received. Refill Completed. Jason Choi (R.M.A)   

## 2011-12-01 NOTE — Telephone Encounter (Signed)
Referred by Dr. Norma Fredrickson Dx- Anemia

## 2011-12-06 ENCOUNTER — Other Ambulatory Visit: Payer: Self-pay | Admitting: Internal Medicine

## 2011-12-06 ENCOUNTER — Encounter: Payer: Self-pay | Admitting: Nurse Practitioner

## 2011-12-06 ENCOUNTER — Ambulatory Visit (HOSPITAL_COMMUNITY)
Admission: RE | Admit: 2011-12-06 | Discharge: 2011-12-06 | Disposition: A | Discharge status: Home / Self Care | Payer: Medicare Other | Source: Ambulatory Visit | Attending: Internal Medicine | Admitting: Internal Medicine

## 2011-12-06 ENCOUNTER — Ambulatory Visit (INDEPENDENT_AMBULATORY_CARE_PROVIDER_SITE_OTHER): Payer: Medicare Other | Admitting: Nurse Practitioner

## 2011-12-06 VITALS — BP 108/46 | HR 65 | Ht 75.0 in | Wt 251.6 lb

## 2011-12-06 DIAGNOSIS — I509 Heart failure, unspecified: Secondary | ICD-10-CM

## 2011-12-06 DIAGNOSIS — I5020 Unspecified systolic (congestive) heart failure: Secondary | ICD-10-CM

## 2011-12-06 DIAGNOSIS — I2589 Other forms of chronic ischemic heart disease: Secondary | ICD-10-CM

## 2011-12-06 DIAGNOSIS — I428 Other cardiomyopathies: Secondary | ICD-10-CM | POA: Insufficient documentation

## 2011-12-06 DIAGNOSIS — I5022 Chronic systolic (congestive) heart failure: Secondary | ICD-10-CM

## 2011-12-06 DIAGNOSIS — I517 Cardiomegaly: Secondary | ICD-10-CM | POA: Insufficient documentation

## 2011-12-06 DIAGNOSIS — I059 Rheumatic mitral valve disease, unspecified: Secondary | ICD-10-CM | POA: Insufficient documentation

## 2011-12-06 LAB — CBC
HCT: 27.7 % — ABNORMAL LOW (ref 39.0–52.0)
Hemoglobin: 9.1 g/dL — ABNORMAL LOW (ref 13.0–17.0)
MCHC: 32.7 g/dL (ref 30.0–36.0)
MCV: 84.7 fl (ref 78.0–100.0)
Platelets: 155 10*3/uL (ref 150.0–400.0)
RBC: 3.28 Mil/uL — ABNORMAL LOW (ref 4.22–5.81)
RDW: 20 % — ABNORMAL HIGH (ref 11.5–14.6)
WBC: 5.2 10*3/uL (ref 4.5–10.5)

## 2011-12-06 LAB — BASIC METABOLIC PANEL
BUN: 48 mg/dL — ABNORMAL HIGH (ref 6–23)
CO2: 29 mEq/L (ref 19–32)
Calcium: 8.7 mg/dL (ref 8.4–10.5)
Chloride: 95 mEq/L — ABNORMAL LOW (ref 96–112)
Creatinine, Ser: 2.1 mg/dL — ABNORMAL HIGH (ref 0.4–1.5)
GFR: 33.07 mL/min — ABNORMAL LOW (ref >60.00)
Glucose, Bld: 118 mg/dL — ABNORMAL HIGH (ref 70–99)
Potassium: 3.7 mEq/L (ref 3.5–5.1)
Sodium: 134 mEq/L — ABNORMAL LOW (ref 135–145)

## 2011-12-06 NOTE — Assessment & Plan Note (Signed)
He does look better today. We will recheck his labs today. He may end up needing Zaroxolyn once or twice a week but will hold off on that for now. He is getting his cardiac MRI today. He will be seeing Dr. Gaylyn Rong on Monday for hematology and correction of his anemia. This is felt to be beneficial for him. We have discussed possible ICD implant. I understand his hesitations better. We will see him back in about 4 weeks. For now, no change with his current regimen. Patient is agreeable to this plan and will call if any problems develop in the interim.

## 2011-12-06 NOTE — Patient Instructions (Addendum)
Proceed on with your MRI today  See Hematology on Monday as planned  We are checking labs today  I will see you in a month with Dr. Sw[place]and  For now, stay on your current medicines  Call the [facility] Care office at [phone] if you have any questions, problems or concerns.

## 2011-12-06 NOTE — Progress Notes (Signed)
Jason Choi Date of Birth: [birth year] Medical Record #[id number]  History of Present Illness: Jason Choi is seen today for a follow up visit. He is seen for Jason Choi. He has known CAD with a reduced EF. He has not tolerated aldactone or higher doses of Creg due to orthostasis. He had his CABG back in 2002. He is on amiodarone for his PAF as well as his coumadin. Has pulmonary HTN as well. He has had recurrent pneumonia this year. He is also anemic and has had a GI evaluation that showed no evidence of bleeding per Jason Choi's report. He is on an iron pill. He was seen by EP back in March for consideration of an ICD. His repeat echo showed an EF of 35 to 40% and given the severe biventricular enlargement, Jason Choi though it was an overestimation. Jason Choi had wanted to proceed on with an MRI. This had not been completed. I saw him about 10 days ago with volume overload. We treated him with Zaroxolyn, arranged for the MRI and have referred him to hematology for correction of his anemia.  He comes in today. He is here alone. He is better. Weight is down 7 pounds. Breathing easier. For his MRI today. Sees Hematology on Monday. No chest pain. Still with some edema. We have discussed ICD implant. 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 He is not dizzy or lightheaded. No palpitations. No chest pain.   Current Outpatient Prescriptions on File Prior to Visit  Medication Sig Dispense Refill  . amiodarone (PACERONE) 200 MG tablet Take 200 mg by mouth daily.       Marland Kitchen aspirin 81 MG tablet Take 81 mg by mouth daily.       Marland Kitchen atorvastatin (LIPITOR) 40 MG tablet Take 40 mg by mouth daily.       . carvedilol (COREG) 25 MG tablet Take 0.5 tablets (12.5 mg total) by mouth 2 (two) times daily with a meal.  90 tablet  2  . fish oil-omega-3 fatty acids 1000 MG capsule Take 1 g by mouth daily.        . furosemide (LASIX) 40 MG tablet Take 3 tablets (120 mg  total) by mouth daily.  90 tablet  0  . Multiple Vitamin (MULTIVITAMIN) tablet Take 1 tablet by mouth daily.        . ramipril (ALTACE) 10 MG capsule TAKE ONE CAPSULE BY MOUTH DAILY  90 capsule  2  . warfarin (COUMADIN) 5 MG tablet Take 1 tablet (5 mg total) by mouth as directed.  90 tablet  2    Allergies  Allergen Reactions  . Procaine Hcl     Unknown reaction    Past Medical History  Diagnosis Date  . S/P CABG (coronary artery bypass graft) 2002    SVG to PDA, Free radial to Intermediate, LIMA to LAD by Jason Choi  . HTN (hypertension)   . Hypercholesteremia   . LV dysfunction     EF 35 to 40% per echo May 2012; EF remains 35 to 40% per echo Feb 2013. Referred for ICD  . Chronic anticoagulation     on coumadin  . 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 High risk medication use     on amiodarone  . Atrial fibrillation     ON AMIODARONE: PER VISIT NOTE IN [facility] HB  . Chronic systolic dysfunction of left ventricle   .  Pulmonary hypertension, moderate to severe   . Cataract immature     BILATERAL  . Osteoarthritis of right shoulder region     ACUTE PAIN  . Echocardiogram abnormal 10-10-10  . Rotator cuff tear 2012  . Anemia     Referral to GI Feb 2013    Past Surgical History  Procedure Date  . Right leg surgeries     due to polio  . Left knee arthroscopy 2000  . Cardiac catheterization 2009    Grafts patent  . Cardiac catheterization 2012    Grafts patent. EF was 25 to 30% but 35 to 40 by echo  . Cardiac catheterization 2003  . Coronary artery bypass graft 2002    X3 VESSEL  . Cardioversion 2008--  FOR PAF    SUCCESSFUL  . Right shoulder rotator cuffe repair 05/16/11    History  Smoking status  . Former Smoker  . Types: Cigarettes  . Quit date: 05/12/1979  Smokeless tobacco  . Never Used  Comment: stopped smoking over 30 years ago.    History  Alcohol Use  . 8.4 oz/week  . 14 Cans of beer per week    couple beers and a shot of crown  royal most days    Family History  Problem Relation Age of Onset  . Valvular heart disease Father   . Colon cancer Neg Hx   . Colon polyps Neg Hx   . Rectal cancer Neg Hx   . Stomach cancer Neg Hx     Review of Systems: The review of systems is per the HPI.  All other systems were reviewed and are negative.  Physical Exam: BP 108/46  Pulse 65  Ht 6\' 3"  (1.905 m)  Wt 251 lb 9.6 oz (114.125 kg)  BMI 31.45 kg/m2  SpO2 98% Patient is very pleasant and in no acute distress. He is obese. Skin is warm and dry. Color is normal.  HEENT is unremarkable. Normocephalic/atraumatic. PERRL. Sclera are nonicteric. Neck is supple. No masses. No JVD. Lungs are clear. Cardiac exam shows an irregular rhythm. His rate is controlled. Abdomen is soft. Extremities are with less edema today, left still greater than the right. Gait and ROM are intact. No gross neurologic deficits noted.   LABORATORY DATA: BMET and CBC are pending for today.  Lab Results  Component Value Date   WBC 6.5 11/27/2011   HGB 9.5* 11/27/2011   HCT 29.5* 11/27/2011   PLT 187.0 11/27/2011   GLUCOSE 107* 11/27/2011   NA 138 11/27/2011   K 4.3 11/27/2011   CL 103 11/27/2011   CREATININE 1.7* 11/27/2011   BUN 34* 11/27/2011   CO2 26 11/27/2011   TSH 0.63 07/19/2011   INR 2.9 11/27/2011     Assessment / Plan:

## 2011-12-08 ENCOUNTER — Encounter: Payer: Self-pay | Admitting: Oncology

## 2011-12-08 DIAGNOSIS — D649 Anemia, unspecified: Secondary | ICD-10-CM | POA: Insufficient documentation

## 2011-12-11 ENCOUNTER — Ambulatory Visit (HOSPITAL_BASED_OUTPATIENT_CLINIC_OR_DEPARTMENT_OTHER): Payer: Medicare Other | Admitting: Oncology

## 2011-12-11 ENCOUNTER — Ambulatory Visit (HOSPITAL_BASED_OUTPATIENT_CLINIC_OR_DEPARTMENT_OTHER): Payer: Medicare Other | Admitting: Lab

## 2011-12-11 ENCOUNTER — Encounter: Payer: Self-pay | Admitting: Oncology

## 2011-12-11 ENCOUNTER — Telehealth: Payer: Self-pay | Admitting: Oncology

## 2011-12-11 VITALS — BP 97/53 | HR 62 | Temp 97.2°F | Ht 73.0 in | Wt 252.8 lb

## 2011-12-11 DIAGNOSIS — D649 Anemia, unspecified: Secondary | ICD-10-CM

## 2011-12-11 DIAGNOSIS — N182 Chronic kidney disease, stage 2 (mild): Secondary | ICD-10-CM

## 2011-12-11 DIAGNOSIS — I2789 Other specified pulmonary heart diseases: Secondary | ICD-10-CM

## 2011-12-11 DIAGNOSIS — I4891 Unspecified atrial fibrillation: Secondary | ICD-10-CM

## 2011-12-11 LAB — CBC & DIFF AND RETIC
Eosinophils Absolute: 0.2 10*3/uL (ref 0.0–0.5)
HCT: 29.2 % — ABNORMAL LOW (ref 38.4–49.9)
LYMPH%: 11.1 % — ABNORMAL LOW (ref 14.0–49.0)
MONO#: 0.5 10*3/uL (ref 0.1–0.9)
NEUT#: 3.8 10*3/uL (ref 1.5–6.5)
NEUT%: 75.6 % — ABNORMAL HIGH (ref 39.0–75.0)
Platelets: 168 10*3/uL (ref 140–400)
WBC: 5 10*3/uL (ref 4.0–10.3)

## 2011-12-11 LAB — MORPHOLOGY

## 2011-12-11 LAB — CHCC SMEAR

## 2011-12-11 NOTE — Patient Instructions (Addendum)
A.  Issue:  Anemia: - Possible causes:  Chronic kidney disease. Past work up did not show iron/Vit B12 deficiency or thyroid dysfunction.  I need to rule out multiple myeloma.  In the future, if anemia significantly worsens, a bone marrow biopsy may be considered to rule out primary bone marrow state that can result in anemia (Myelodysplastic syndrome MDS or pure red cell aplasia).  - Treatment:  Pending work up.  However, if it is due to chronic kidney disease, treatment would be observation and transfusion of paced red blood cell for Hgb <8.  If patients require frequent blood transfusion, we may consider Aranesp (a hormone injection once a month) to potentially increase Hgb and decrease need for frequent blood transfusion.  Potential risk of Aranesp:  Slightly increase in risk of stroke and heart attack.   B.  Follow up:  Lab in 2 and 4 months.  Return visit in about 6 months.

## 2011-12-11 NOTE — Progress Notes (Signed)
[facility]  [phone] [phone]     INITIAL HEMATOLOGY CONSULTATION    Referral MD:  Dr. Peter M. Swaziland, M.D.  Reason for Referral: chronic normocytic anemia.     HPI: Mr. Jason Choi. Choi is a 71 year-old man with HTN, CAD, CHF, CKD.  He was in usual state of health until early 2013 when he developed pneumonia.  He developed worsening SOB, DOE, bloating sensation since then.  His latest EF was 35% per cardiac MRI in June 2013.  His PCP and cardiologist have noted that he has had chronic anemia for the past two years.  Outpatient GI work up with Dr. Claudette Head on 09/20/2011 did not show any GI abnormality.  He did not have iron deficiency, hypothyroidism, or VitB12 deficiency.  Therefore, he was kindly referred to the [facility] for evaluation.   Jason Choi presented to the clinic by himself today. He reports minimal pedal edema.  However, he has DOE, generalized fatigue; feeling bloated from the CHF and pulmonary hypertension.  He has good appetite; and has not noticed much weight loss. He denied any visible source of bleeding.   Patient denies fever, headache, visual changes, confusion, drenching night sweats, palpable lymph node swelling, mucositis, odynophagia, dysphagia, nausea vomiting, jaundice, chest pain, palpitation, productive cough, gum bleeding, epistaxis, hematemesis, hemoptysis, early satiety, melena, hematochezia, hematuria, skin rash, spontaneous bleeding, joint swelling, joint pain, heat or cold intolerance, bowel bladder incontinence, back pain, focal motor weakness, paresthesia, depression, suicidal or homocidal ideation, feeling hopelessness.     Past Medical History  Diagnosis Date  . S/P CABG (coronary artery bypass graft) 2002    SVG to PDA, Free radial to Intermediate, LIMA to LAD by Dr Donata Clay  . HTN (hypertension)   . Hypercholesteremia   . LV dysfunction     EF 35 to 40% per echo May 2012; EF remains 35 to  40% per echo Feb 2013. Referred for ICD  . Chronic anticoagulation     on coumadin  . 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 High risk medication use     on amiodarone  . Atrial fibrillation     ON AMIODARONE: PER VISIT NOTE IN SINUS FIRST DEGREE HB  . Pulmonary hypertension, moderate to severe   . Cataract immature     BILATERAL  . Osteoarthritis of right shoulder region     ACUTE PAIN  . Rotator cuff tear 2012  . Anemia     Referral to GI Feb 2013  . Chronic kidney disease (CKD), stage II (mild)   :    Past Surgical History  Procedure Date  . Right leg surgeries     due to polio  . Left knee arthroscopy 2000  . Cardiac catheterization 2009    Grafts patent  . Cardiac catheterization 2012    Grafts patent. EF was 25 to 30% but 35 to 40 by echo  . Cardiac catheterization 2003  . Coronary artery bypass graft 2002    X3 VESSEL  . Cardioversion 2008--  FOR PAF    SUCCESSFUL  . Right shoulder rotator cuffe repair 05/16/11  :   CURRENT MEDS: Current Outpatient Prescriptions  Medication Sig Dispense Refill  . amiodarone (PACERONE) 200 MG tablet Take 200 mg by mouth daily.       Marland Kitchen aspirin 81 MG tablet Take 81 mg by mouth daily.       Marland Kitchen atorvastatin (LIPITOR) 40 MG tablet Take  40 mg by mouth daily.       . carvedilol (COREG) 25 MG tablet Take 0.5 tablets (12.5 mg total) by mouth 2 (two) times daily with a meal.  90 tablet  2  . fish oil-omega-3 fatty acids 1000 MG capsule Take 1 g by mouth daily.        . furosemide (LASIX) 40 MG tablet Take 3 tablets (120 mg total) by mouth daily.  90 tablet  0  . Multiple Vitamin (MULTIVITAMIN) tablet Take 1 tablet by mouth daily.        . ramipril (ALTACE) 10 MG capsule TAKE ONE CAPSULE BY MOUTH DAILY  90 capsule  2  . warfarin (COUMADIN) 5 MG tablet Take 1 tablet (5 mg total) by mouth as directed.  90 tablet  2      Allergies  Allergen Reactions  . Procaine Hcl     Unknown reaction  :  Family History  Problem Relation Age of  Onset  . Valvular heart disease Father   . Colon cancer Neg Hx   . Colon polyps Neg Hx   . Rectal cancer Neg Hx   . Stomach cancer Neg Hx   :  History   Social History  . Marital Status: Married    Spouse Name: N/A    Number of Children: 1  . Years of Education: N/A   Occupational History  . 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     Social History Main Topics  . Smoking status: Former Smoker    Types: Cigarettes    Quit date: 05/12/1979  . Smokeless tobacco: Never Used   Comment: stopped smoking over 30 years ago.  . Alcohol Use: 8.4 oz/week    14 Cans of beer per week     couple beers and a shot of crown royal most days  . Drug Use: No  . Sexually Active: Not Currently   Other Topics Concern  . Not on file   Social History Narrative   Pt lives in [place] with spouse (who is a Freight forwarder).  1 grown healthy daughter who is a Tree surgeon. Retired 2003 from [employer].  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  :  REVIEW OF SYSTEM:  The rest of the 14-point review of sytem was negative.   Exam: ECOG 1.   General:  Mildly obese man, in no acute distress.  Eyes:  no scleral icterus.  ENT:  There were no oropharyngeal lesions.  Neck was without thyromegaly.  Lymphatics:  Negative cervical, supraclavicular or axillary adenopathy.  Respiratory: lungs were clear bilaterally without wheezing or crackles.  Cardiovascular:  Regular rate and rhythm, S1/S2, without murmur, rub or gallop.  There was trace bilateral pedal edema.  GI:  abdomen was soft, distended, positive for fluid wave, nontender, without organomegaly.  Muscoloskeletal:  no spinal tenderness of palpation of vertebral spine.  Skin exam was without echymosis, petichae.  Neuro exam was nonfocal.  Patient was able to get on and off exam table without assistance.  Gait was normal.  Patient was alerted and oriented.  Attention was good.   Language was appropriate.   Mood was normal without depression.  Speech was not pressured.  Thought content was not tangential.    LABS:  Lab Results  Component Value Date   WBC 5.0 12/11/2011   HGB 9.5* 12/11/2011   HCT 29.2* 12/11/2011   PLT 168 12/11/2011   GLUCOSE  118* 12/06/2011   NA 134* 12/06/2011   K 3.7 12/06/2011   CL 95* 12/06/2011   CREATININE 2.1* 12/06/2011   BUN 48* 12/06/2011   CO2 29 12/06/2011   INR 2.9 11/27/2011    Blood smear review:   I personally reviewed the patient's peripheral blood smear today.  There was anisocytosis.  There was no peripheral blast.  There was no schistocytosis, spherocytosis, target cell, rouleaux formation, tear drop cell.  There was no giant platelets or platelet clumps.     ASSESSMENT AND PLAN:   1.  Hypertension:  Well controlled on ramipril and carvedilol per PCP. 2.  Hyperlipidemia:  On atorvastatin per PCP.  3.  Congestive heart failure:  Slight fluid overload with abdominal ascites.  He is in Lasix; dose cannot be increased further due to borderline blood pressure.  He is on amiodarone, ramipril and carvedilol.   4.  History of paroxysmal afib:  He is rate and rhythm controlled today.  He is on Coumadin.  5.  Pulmonary hypertension. 6.  Chronic kidney disease, stage II:  Most likely due to chronic hypertension.  I sent for SPEP and serum light chain to rule out myeloma.  7.  Normocytic anemia: - Differential:  Anemia of chronic kidney disease. Past work up did not show iron/Vit B12 deficiency or thyroid dysfunction.  I have low clinical suspicion for hemolysis as no jaundice.   I need to rule out multiple myeloma given CKD.  In the future, if anemia significantly worsens, a bone marrow biopsy may be considered to rule out primary bone marrow state that can result in anemia (Myelodysplastic syndrome MDS or pure red cell aplasia).  - Treatment:  Pending work up.  However, if it is due to chronic kidney disease, treatment would be observation and transfusion of paced red  blood cell for Hgb <8.  If he requires frequent blood transfusion, we may consider Aranesp to potentially increase Hgb and decrease need for frequent blood transfusion.   Aranesp can potentially lightly increase in risk of stroke and heart attack.  Therefore, I reserve Aranesp for patients with anemia of CKD who requires frequent RBC transfusion.  Jason Choi expressed informed understanding and wished to proceed with observation at this time.   -  Follow up:  Lab at the [facility] in about 2 and 4 months.  Return visit in about 6 months.     Thank you for this referral.    The length of time of the face-to-face encounter was 45 minutes. More than 50% of time was spent counseling and coordination of care.

## 2011-12-11 NOTE — Telephone Encounter (Signed)
appts made and printed for pt aom °

## 2011-12-13 LAB — PROTEIN ELECTROPHORESIS, SERUM
Albumin ELP: 54.3 % — ABNORMAL LOW (ref 55.8–66.1)
Alpha-1-Globulin: 6.5 % — ABNORMAL HIGH (ref 2.9–4.9)
Alpha-2-Globulin: 13.1 % — ABNORMAL HIGH (ref 7.1–11.8)
Beta 2: 5.6 % (ref 3.2–6.5)

## 2011-12-13 LAB — COMPREHENSIVE METABOLIC PANEL
ALT: 32 U/L (ref 0–53)
AST: 51 U/L — ABNORMAL HIGH (ref 0–37)
Alkaline Phosphatase: 77 U/L (ref 39–117)
CO2: 28 mEq/L (ref 19–32)
Creatinine, Ser: 1.71 mg/dL — ABNORMAL HIGH (ref 0.50–1.35)
Total Bilirubin: 1 mg/dL (ref 0.3–1.2)

## 2011-12-13 LAB — KAPPA/LAMBDA LIGHT CHAINS: Kappa:Lambda Ratio: 0.99 (ref 0.26–1.65)

## 2011-12-13 LAB — LACTATE DEHYDROGENASE: LDH: 176 U/L (ref 94–250)

## 2011-12-18 ENCOUNTER — Telehealth: Payer: Self-pay | Admitting: Cardiology

## 2011-12-18 NOTE — Telephone Encounter (Signed)
Pt having swelling from waist down, can change med???

## 2011-12-18 NOTE — Telephone Encounter (Signed)
Patient called stated he has had increase in weight within 1 1/2 weeks 250 lbs to 262 lbs.States he is retaining fluid from his waist down into legs and feet.States he is sob.Spoke to DOD Dr.Cooper he advised to take zaroxolyn 2.5 mg today  12/18/11 and tomorrow 12/19/11.Also take kdur 20 meq along with each zaroxolyn.Advised to make appointment with a PA this week.All PAs schedules are full will check with Dr.Jordan when he is in office this week 12/21/11.

## 2011-12-20 NOTE — Telephone Encounter (Addendum)
Patient called back again. States that his current weight is 258 pounds and still has complaints of SOB and retaining fluid below the waist. He states that he felt a little better after taking Zaroxolyn but then becomes swollen at the end of the day again. He will take 1 Zaroxolyn 2.5 mg with 1 KCL tomorrow and then see Dr.Jordan in the office on 7/26 at 10 am. Advised him that he will need lab work that day but will see MD first because he will decide what lab work he needs.

## 2011-12-20 NOTE — Telephone Encounter (Signed)
Fu call °Pt calling back again °

## 2011-12-21 ENCOUNTER — Telehealth: Payer: Self-pay | Admitting: *Deleted

## 2011-12-21 NOTE — Telephone Encounter (Signed)
Pt left VM requesting results of his labs from 7/15.

## 2011-12-22 ENCOUNTER — Encounter: Payer: Self-pay | Admitting: Cardiology

## 2011-12-22 ENCOUNTER — Ambulatory Visit (INDEPENDENT_AMBULATORY_CARE_PROVIDER_SITE_OTHER): Payer: Medicare Other | Admitting: Cardiology

## 2011-12-22 ENCOUNTER — Ambulatory Visit (INDEPENDENT_AMBULATORY_CARE_PROVIDER_SITE_OTHER): Payer: Medicare Other | Admitting: *Deleted

## 2011-12-22 VITALS — BP 119/64 | HR 55 | Wt 258.0 lb

## 2011-12-22 DIAGNOSIS — I4891 Unspecified atrial fibrillation: Secondary | ICD-10-CM

## 2011-12-22 DIAGNOSIS — I509 Heart failure, unspecified: Secondary | ICD-10-CM

## 2011-12-22 DIAGNOSIS — D649 Anemia, unspecified: Secondary | ICD-10-CM

## 2011-12-22 DIAGNOSIS — I2510 Atherosclerotic heart disease of native coronary artery without angina pectoris: Secondary | ICD-10-CM

## 2011-12-22 DIAGNOSIS — R0602 Shortness of breath: Secondary | ICD-10-CM

## 2011-12-22 DIAGNOSIS — I5023 Acute on chronic systolic (congestive) heart failure: Secondary | ICD-10-CM

## 2011-12-22 DIAGNOSIS — Z7901 Long term (current) use of anticoagulants: Secondary | ICD-10-CM

## 2011-12-22 DIAGNOSIS — N189 Chronic kidney disease, unspecified: Secondary | ICD-10-CM

## 2011-12-22 LAB — BASIC METABOLIC PANEL
CO2: 28 mEq/L (ref 19–32)
Chloride: 99 mEq/L (ref 96–112)
Sodium: 136 mEq/L (ref 135–145)

## 2011-12-22 LAB — BRAIN NATRIURETIC PEPTIDE: Pro B Natriuretic peptide (BNP): 1148 pg/mL — ABNORMAL HIGH (ref 0.0–100.0)

## 2011-12-22 MED ORDER — METOLAZONE 5 MG PO TABS: 5.0000 mg | ORAL_TABLET | Freq: Every day | ORAL | Status: DC

## 2011-12-22 MED ORDER — FUROSEMIDE 80 MG PO TABS: 80.0000 mg | ORAL_TABLET | Freq: Two times a day (BID) | ORAL | Status: DC

## 2011-12-22 MED ORDER — POTASSIUM CHLORIDE CRYS ER 20 MEQ PO TBCR: 20.0000 meq | EXTENDED_RELEASE_TABLET | Freq: Every day | ORAL | Status: DC

## 2011-12-22 NOTE — Patient Instructions (Signed)
Increase Lasix to 80 mg twice a day.  Add metolazone 5 mg daily and potassium 20 meq daily.  We will check lab work today.  We will see you in 2 weeks.  I will schedule you a visit with nephrology.

## 2011-12-24 NOTE — Assessment & Plan Note (Signed)
He has chronic kidney disease stage III. This is associated with anemia of chronic disease. This has not been formally evaluated in the past and we will arrange for him to see nephrology.

## 2011-12-24 NOTE — Progress Notes (Signed)
Jason Choi Date of Birth: [birth year] Medical Record #[id number]  History of Present Illness: Jason Choi is seen today for a follow up visit.  He has known CAD and congestive heart failure. He has not tolerated aldactone or higher doses of Creg due to orthostasis. He had his CABG back in 2002. He is on amiodarone for his PAF as well as his coumadin. Has pulmonary HTN as well. He has anemia and has been evaluated by hematology. It was felt that he had anemia of chronic disease. He was seen by EP back in March for consideration of an ICD. His repeat echo showed an EF of 35 to 40% and given the severe biventricular enlargement, recent MRI demonstrated an ejection fraction of 34% with marked left ventricular enlargement and global hypokinesis.  He comes in today. He is not doing well. He has noticed a significant increase in edema, abdominal distention, and weight. He denies any changes in his diet and has been restricting his salt intake. His breathing is worse and he has some orthopnea. He initially tried increasing his Lasix to 200 mg without any increase in diuresis. I this past week he took metolazone Sunday, Monday, Tuesday, and Thursday. This has helped some with his breathing and orthopnea. He is still volume overloaded.   Current Outpatient Prescriptions on File Prior to Visit  Medication Sig Dispense Refill  . amiodarone (PACERONE) 200 MG tablet Take 200 mg by mouth daily.       Marland Kitchen aspirin 81 MG tablet Take 81 mg by mouth daily.       Marland Kitchen atorvastatin (LIPITOR) 40 MG tablet Take 40 mg by mouth daily.       . carvedilol (COREG) 25 MG tablet Take 0.5 tablets (12.5 mg total) by mouth 2 (two) times daily with a meal.  90 tablet  2  . fish oil-omega-3 fatty acids 1000 MG capsule Take 1 g by mouth daily.        . Multiple Vitamin (MULTIVITAMIN) tablet Take 1 tablet by mouth daily.        . ramipril (ALTACE) 10 MG capsule TAKE ONE CAPSULE BY MOUTH DAILY  90 capsule  2  . warfarin (COUMADIN) 5 MG  tablet Take 1 tablet (5 mg total) by mouth as directed.  90 tablet  2  . furosemide (LASIX) 80 MG tablet Take 1 tablet (80 mg total) by mouth 2 (two) times daily.  180 tablet  3    Allergies  Allergen Reactions  . Procaine Hcl     Unknown reaction    Past Medical History  Diagnosis Date  . S/P CABG (coronary artery bypass graft) 2002    SVG to PDA, Free radial to Intermediate, LIMA to LAD by Dr Donata Clay  . HTN (hypertension)   . Hypercholesteremia   . LV dysfunction     EF 35 to 40% per echo May 2012; EF remains 35 to 40% per echo Feb 2013. Referred for ICD  . Chronic anticoagulation     on coumadin  . 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 High risk medication use     on amiodarone  . Atrial fibrillation     ON AMIODARONE: PER VISIT NOTE IN SINUS FIRST DEGREE HB  . Pulmonary hypertension, moderate to severe   . Cataract immature     BILATERAL  . Osteoarthritis of right shoulder region     ACUTE PAIN  . Rotator cuff tear 2012  . Anemia  Referral to GI Feb 2013  . Chronic kidney disease (CKD), stage II (mild)     Past Surgical History  Procedure Date  . Right leg surgeries     due to polio  . Left knee arthroscopy 2000  . Cardiac catheterization 2009    Grafts patent  . Cardiac catheterization 2012    Grafts patent. EF was 25 to 30% but 35 to 40 by echo  . Cardiac catheterization 2003  . Coronary artery bypass graft 2002    X3 VESSEL  . Cardioversion 2008--  FOR PAF    SUCCESSFUL  . Right shoulder rotator cuffe repair 05/16/11    History  Smoking status  . Former Smoker  . Types: Cigarettes  . Quit date: 05/12/1979  Smokeless tobacco  . Never Used  Comment: stopped smoking over 30 years ago.    History  Alcohol Use  . 8.4 oz/week  . 14 Cans of beer per week    couple beers and a shot of crown royal most days    Family History  Problem Relation Age of Onset  . Valvular heart disease Father   . Colon cancer Neg Hx   . Colon polyps Neg Hx     . Rectal cancer Neg Hx   . Stomach cancer Neg Hx     Review of Systems: The review of systems is per the HPI.  All other systems were reviewed and are negative.  Physical Exam: BP 119/64  Pulse 55  Wt 258 lb (117.028 kg) Patient is very pleasant and in no acute distress. He is obese. Skin is warm and dry. Color is normal.  HEENT is unremarkable. Normocephalic/atraumatic. PERRL. Sclera are nonicteric. Neck is supple. No masses. Positive JVD to 6 cm. Lungs are clear. Cardiac exam shows an irregular rhythm. His rate is controlled. Abdomen is soft without definite ascites but increased to abdominal girth. Extremities reveal 2+ edema today, left still greater than the right with chronic musculoskeletal deformity of the right lower extremity. Gait and ROM are intact. No gross neurologic deficits noted.   LABORATORY DATA:  INR is 3.1 today BNP level is elevated at 1148 Sodium is 136, potassium 4.4, CO2 28, BUN 46, creatinine 1.9. Assessment / Plan:

## 2011-12-24 NOTE — Assessment & Plan Note (Signed)
Cardiac catheterization in May of 2012 showed all his grafts were patent. He had moderate pulmonary hypertension and severe left ventricular dysfunction. We will continue with his beta blocker, statin, and Coumadin therapy.

## 2011-12-24 NOTE — Assessment & Plan Note (Signed)
He appears to be maintaining sinus rhythm on amiodarone. We will continue with his chronic anticoagulation.

## 2011-12-24 NOTE — Assessment & Plan Note (Signed)
He has evidence of increased volume overload with increased weight, edema, and orthopnea. He did not respond well to increased Lasix dose. He has responded somewhat better on metolazone. We will increase his Lasix to 80 mg twice a day. We will start him on metolazone 5 mg daily. He will take a potassium supplement 20 mEq daily. I'll followup again in 2 weeks and we'll repeat a basic metabolic panel and BNP level at that time. Recent MRI did confirm an ejection fraction of 34%. This would qualify him for ICD therapy. 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

## 2011-12-29 ENCOUNTER — Telehealth: Payer: Self-pay | Admitting: Cardiology

## 2011-12-29 DIAGNOSIS — R0602 Shortness of breath: Secondary | ICD-10-CM

## 2011-12-29 DIAGNOSIS — I509 Heart failure, unspecified: Secondary | ICD-10-CM

## 2011-12-29 NOTE — Telephone Encounter (Signed)
Patient called stated he has lost 20 lbs in 1 week.States he feels okay.Spoke to Norma Fredrickson NP she advised to hold zaroxolyn.Bmet to done on Monday 01/01/12.Keep appointment with Dr.Jordan 01/03/12.

## 2011-12-29 NOTE — Telephone Encounter (Signed)
New problem:  Patient calling c/o loss 20 lbs in the last week.  Start a new medication zaroxolyn

## 2012-01-01 ENCOUNTER — Telehealth: Payer: Self-pay | Admitting: *Deleted

## 2012-01-01 ENCOUNTER — Other Ambulatory Visit (INDEPENDENT_AMBULATORY_CARE_PROVIDER_SITE_OTHER): Payer: Medicare Other

## 2012-01-01 DIAGNOSIS — I509 Heart failure, unspecified: Secondary | ICD-10-CM

## 2012-01-01 DIAGNOSIS — R0602 Shortness of breath: Secondary | ICD-10-CM

## 2012-01-01 LAB — BASIC METABOLIC PANEL
BUN: 57 mg/dL — ABNORMAL HIGH (ref 6–23)
CO2: 29 mEq/L (ref 19–32)
Calcium: 9 mg/dL (ref 8.4–10.5)
Chloride: 95 mEq/L — ABNORMAL LOW (ref 96–112)
Creatinine, Ser: 2 mg/dL — ABNORMAL HIGH (ref 0.4–1.5)
GFR: 34.96 mL/min — ABNORMAL LOW (ref >60.00)
Glucose, Bld: 156 mg/dL — ABNORMAL HIGH (ref 70–99)
Potassium: 3.6 mEq/L (ref 3.5–5.1)
Sodium: 134 mEq/L — ABNORMAL LOW (ref 135–145)

## 2012-01-01 LAB — BRAIN NATRIURETIC PEPTIDE: Pro B Natriuretic peptide (BNP): 718 pg/mL — ABNORMAL HIGH (ref 0.0–100.0)

## 2012-01-01 NOTE — Telephone Encounter (Signed)
Message copied by Awilda Bill on Mon Jan 01, 2012  3:34 PM ------      Message from: Rosalio Macadamia      Created: Mon Jan 01, 2012  3:07 PM       Ok to report. His Zaroxolyn was stopped on Friday. I would stay off of the Zaroxolyn. Continue other medicines. He is seeing Dr. Swaziland on Wednesday.

## 2012-01-01 NOTE — Telephone Encounter (Signed)
Called patient to notify of lab results.  Pt aware to continue all medications except zaroxolyn.  Vista Mink, CMA

## 2012-01-03 ENCOUNTER — Other Ambulatory Visit: Payer: Medicare Other

## 2012-01-03 ENCOUNTER — Encounter: Payer: Self-pay | Admitting: Cardiology

## 2012-01-03 ENCOUNTER — Ambulatory Visit (INDEPENDENT_AMBULATORY_CARE_PROVIDER_SITE_OTHER): Payer: Medicare Other | Admitting: Cardiology

## 2012-01-03 VITALS — BP 98/60 | HR 53 | Ht 75.0 in | Wt 243.0 lb

## 2012-01-03 DIAGNOSIS — R0602 Shortness of breath: Secondary | ICD-10-CM

## 2012-01-03 DIAGNOSIS — I509 Heart failure, unspecified: Secondary | ICD-10-CM

## 2012-01-03 DIAGNOSIS — I5023 Acute on chronic systolic (congestive) heart failure: Secondary | ICD-10-CM

## 2012-01-03 DIAGNOSIS — I4891 Unspecified atrial fibrillation: Secondary | ICD-10-CM

## 2012-01-03 DIAGNOSIS — I2510 Atherosclerotic heart disease of native coronary artery without angina pectoris: Secondary | ICD-10-CM

## 2012-01-03 DIAGNOSIS — E871 Hypo-osmolality and hyponatremia: Secondary | ICD-10-CM

## 2012-01-03 NOTE — Patient Instructions (Signed)
Take metolazone every third day. If your weight continues to increase go to every other day.  Restrict your fluid intake to 1.5 liters/day  We will check your electrolytes again in 2 weeks  I will see you again in 6 weeks.

## 2012-01-03 NOTE — Assessment & Plan Note (Signed)
His volume overload has responded well to the addition of metolazone. However we're running into problems with hyponatremia and hypotension. I recommended taking metolazone every third day. If he can maintain his weight on this and we will continue. If his weight continues to increase we will increase it to every other day. I will repeat a basic metabolic panel in 2 weeks. I will followup again in 6 weeks. We will continue his current Lasix dose.

## 2012-01-03 NOTE — Assessment & Plan Note (Signed)
Fortunately his creatinine has remained fairly stable despite the more aggressive diuresis. We still plan on having him evaluated by nephrology.

## 2012-01-03 NOTE — Assessment & Plan Note (Signed)
His hyponatremia is related to more aggressive diuresis. We have reduced his metolazone frequency. I recommended fluid restriction. 1.5-2 L per day.

## 2012-01-03 NOTE — Progress Notes (Signed)
Jason Choi Date of Birth: 12-23-1940 Medical Record #[id number]  History of Present Illness: Jason Choi is seen today for a follow up of his congestive heart failure.  He has not tolerated aldactone or higher doses of Coreg due to orthostasis. He had his CABG back in 2002. He is on amiodarone for his PAF as well as his coumadin. Has pulmonary HTN as well. He has anemia and has been evaluated by hematology. It was felt that he had anemia of chronic disease. He was seen by EP back in March for consideration of an ICD. His repeat echo showed an EF of 35 to 40% and given the severe biventricular enlargement, recent MRI demonstrated an ejection fraction of 34% with marked left ventricular enlargement and global hypokinesis. He has deferred defibrillator placement. On his last visit he was significantly volume overloaded. This did not respond to increasing diuretic therapy with Lasix. We added metolazone and he did achieve a significant diuresis. Unfortunately this resulted in hypotension and lightheadedness. His metolazone has been held over the past 3 days. His weight is still down 15 pounds. He is feeling much better. His edema has improved and his shortness of breath has resolved.   Current Outpatient Prescriptions on File Prior to Visit  Medication Sig Dispense Refill  . amiodarone (PACERONE) 200 MG tablet Take 200 mg by mouth daily.       Marland Kitchen aspirin 81 MG tablet Take 81 mg by mouth daily.       Marland Kitchen atorvastatin (LIPITOR) 40 MG tablet Take 40 mg by mouth daily.       . carvedilol (COREG) 25 MG tablet Take 0.5 tablets (12.5 mg total) by mouth 2 (two) times daily with a meal.  90 tablet  2  . fish oil-omega-3 fatty acids 1000 MG capsule Take 1 g by mouth daily.        . furosemide (LASIX) 80 MG tablet Take 1 tablet (80 mg total) by mouth 2 (two) times daily.  180 tablet  3  . metolazone (ZAROXOLYN) 5 MG tablet Take 1 tablet (5 mg total) by mouth daily.  90 tablet  3  . Multiple Vitamin (MULTIVITAMIN)  tablet Take 1 tablet by mouth daily.        . potassium chloride SA (K-DUR,KLOR-CON) 20 MEQ tablet Take 1 tablet (20 mEq total) by mouth daily.  90 tablet  3  . ramipril (ALTACE) 10 MG capsule TAKE ONE CAPSULE BY MOUTH DAILY  90 capsule  2  . warfarin (COUMADIN) 5 MG tablet Take 1 tablet (5 mg total) by mouth as directed.  90 tablet  2    Allergies  Allergen Reactions  . Procaine Hcl     Unknown reaction    Past Medical History  Diagnosis Date  . S/P CABG (coronary artery bypass graft) 2002    SVG to PDA, Free radial to Intermediate, LIMA to LAD by Dr Donata Clay  . HTN (hypertension)   . Hypercholesteremia   . LV dysfunction     EF 35 to 40% per echo May 2012; EF remains 35 to 40% per echo Feb 2013. Referred for ICD  . Chronic anticoagulation     on coumadin  . 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 High risk medication use     on amiodarone  . Atrial fibrillation     ON AMIODARONE: PER VISIT NOTE IN SINUS FIRST DEGREE HB  . Pulmonary hypertension, moderate to severe   . Cataract immature  BILATERAL  . Osteoarthritis of right shoulder region     ACUTE PAIN  . Rotator cuff tear 2012  . Anemia     Referral to GI Feb 2013  . Chronic kidney disease (CKD), stage II (mild)     Past Surgical History  Procedure Date  . Right leg surgeries     due to polio  . Left knee arthroscopy 2000  . Cardiac catheterization 2009    Grafts patent  . Cardiac catheterization 2012    Grafts patent. EF was 25 to 30% but 35 to 40 by echo  . Cardiac catheterization 2003  . Coronary artery bypass graft 2002    X3 VESSEL  . Cardioversion 2008--  FOR PAF    SUCCESSFUL  . Right shoulder rotator cuffe repair 05/16/11    History  Smoking status  . Former Smoker  . Types: Cigarettes  . Quit date: 05/12/1979  Smokeless tobacco  . Never Used  Comment: stopped smoking over 30 years ago.    History  Alcohol Use  . 8.4 oz/week  . 14 Cans of beer per week    couple beers and a shot of  crown royal most days    Family History  Problem Relation Age of Onset  . Valvular heart disease Father   . Colon cancer Neg Hx   . Colon polyps Neg Hx   . Rectal cancer Neg Hx   . Stomach cancer Neg Hx     Review of Systems: The review of systems is per the HPI.  All other systems were reviewed and are negative.  Physical Exam: BP 98/60  Pulse 53  Ht 6\' 3"  (1.905 m)  Wt 243 lb (110.224 kg)  BMI 30.37 kg/m2  SpO2 97% Patient is very pleasant and in no acute distress. He is obese. Skin is warm and dry. Color is normal.  HEENT is unremarkable. Normocephalic/atraumatic. PERRL. Sclera are nonicteric. Neck is supple. No masses. No significant JVD.. Lungs are clear. Cardiac exam shows an irregular rhythm. His rate is controlled. Abdomen is soft without definite ascites. Extremities reveal trace + edema today, left still greater than the right with chronic musculoskeletal deformity of the right lower extremity. Gait and ROM are intact. No gross neurologic deficits noted.   LABORATORY DATA:  His most recent laboratory data from 2 days ago showed a sodium 134, potassium 3.6, BUN 57, creatinine 2.0. BNP had decreased to 718.  Assessment / Plan:

## 2012-01-17 ENCOUNTER — Other Ambulatory Visit: Payer: Self-pay

## 2012-01-17 ENCOUNTER — Other Ambulatory Visit (INDEPENDENT_AMBULATORY_CARE_PROVIDER_SITE_OTHER): Payer: Medicare Other

## 2012-01-17 DIAGNOSIS — E871 Hypo-osmolality and hyponatremia: Secondary | ICD-10-CM

## 2012-01-17 DIAGNOSIS — I2510 Atherosclerotic heart disease of native coronary artery without angina pectoris: Secondary | ICD-10-CM

## 2012-01-17 DIAGNOSIS — I4891 Unspecified atrial fibrillation: Secondary | ICD-10-CM

## 2012-01-17 DIAGNOSIS — R0602 Shortness of breath: Secondary | ICD-10-CM

## 2012-01-17 DIAGNOSIS — I509 Heart failure, unspecified: Secondary | ICD-10-CM

## 2012-01-17 LAB — BASIC METABOLIC PANEL
CO2: 27 mEq/L (ref 19–32)
GFR: 42.37 mL/min — ABNORMAL LOW (ref >60.00)
Glucose, Bld: 124 mg/dL — ABNORMAL HIGH (ref 70–99)
Potassium: 3.4 mEq/L — ABNORMAL LOW (ref 3.5–5.1)
Sodium: 132 mEq/L — ABNORMAL LOW (ref 135–145)

## 2012-01-17 MED ORDER — POTASSIUM CHLORIDE CRYS ER 20 MEQ PO TBCR: EXTENDED_RELEASE_TABLET | ORAL | Status: DC

## 2012-01-19 ENCOUNTER — Ambulatory Visit (INDEPENDENT_AMBULATORY_CARE_PROVIDER_SITE_OTHER): Payer: Medicare Other | Admitting: *Deleted

## 2012-01-19 DIAGNOSIS — Z7901 Long term (current) use of anticoagulants: Secondary | ICD-10-CM

## 2012-01-19 DIAGNOSIS — I4891 Unspecified atrial fibrillation: Secondary | ICD-10-CM

## 2012-01-19 LAB — POCT INR: INR: 2.7

## 2012-02-15 ENCOUNTER — Encounter: Payer: Self-pay | Admitting: Cardiology

## 2012-02-15 ENCOUNTER — Other Ambulatory Visit: Payer: Medicare Other

## 2012-02-15 ENCOUNTER — Ambulatory Visit (INDEPENDENT_AMBULATORY_CARE_PROVIDER_SITE_OTHER): Payer: Medicare Other | Admitting: *Deleted

## 2012-02-15 ENCOUNTER — Ambulatory Visit (INDEPENDENT_AMBULATORY_CARE_PROVIDER_SITE_OTHER): Payer: Medicare Other | Admitting: Cardiology

## 2012-02-15 VITALS — BP 110/70 | HR 60 | Ht 75.0 in | Wt 230.0 lb

## 2012-02-15 DIAGNOSIS — I2510 Atherosclerotic heart disease of native coronary artery without angina pectoris: Secondary | ICD-10-CM

## 2012-02-15 DIAGNOSIS — I5023 Acute on chronic systolic (congestive) heart failure: Secondary | ICD-10-CM

## 2012-02-15 DIAGNOSIS — I10 Essential (primary) hypertension: Secondary | ICD-10-CM

## 2012-02-15 DIAGNOSIS — I4891 Unspecified atrial fibrillation: Secondary | ICD-10-CM

## 2012-02-15 DIAGNOSIS — Z7901 Long term (current) use of anticoagulants: Secondary | ICD-10-CM

## 2012-02-15 DIAGNOSIS — I509 Heart failure, unspecified: Secondary | ICD-10-CM

## 2012-02-15 NOTE — Patient Instructions (Signed)
Continue your current medication  I will see you again in 3 months   

## 2012-02-15 NOTE — Progress Notes (Signed)
Jason Choi Date of Birth: Jul 16, 1940 Medical Record #[id number]  History of Present Illness: Jason Choi is seen today for a follow up of his congestive heart failure.  He has not tolerated aldactone or higher doses of Coreg due to orthostasis. He had his CABG back in 2002. He is on amiodarone for his PAF as well as his coumadin. Has pulmonary HTN as well. He has anemia and has been followed by Dr. Gaylyn Rong. It is felt that he has anemia of chronic disease. He was seen by EP back in March for consideration of an ICD. His repeat echo showed an EF of 35 to 40% and given the severe biventricular enlargement, recent MRI demonstrated an ejection fraction of 34% with marked left ventricular enlargement and global hypokinesis. He has deferred defibrillator placement.  On followup today he has been doing well over the past 6 weeks. He even played a round of golf last week. He has been maintaining his weight between 126 and 130 pounds. He has not had to take any metolazone over the past few weeks. He is scheduled for evaluation with nephrology next week.   Current Outpatient Prescriptions on File Prior to Visit  Medication Sig Dispense Refill  . amiodarone (PACERONE) 200 MG tablet Take 200 mg by mouth daily.       Marland Kitchen aspirin 81 MG tablet Take 81 mg by mouth daily.       Marland Kitchen atorvastatin (LIPITOR) 40 MG tablet Take 40 mg by mouth daily.       . carvedilol (COREG) 25 MG tablet Take 0.5 tablets (12.5 mg total) by mouth 2 (two) times daily with a meal.  90 tablet  2  . fish oil-omega-3 fatty acids 1000 MG capsule Take 1 g by mouth as needed.       . furosemide (LASIX) 80 MG tablet Take 1 tablet (80 mg total) by mouth 2 (two) times daily.  180 tablet  3  . Multiple Vitamin (MULTIVITAMIN) tablet Take 1 tablet by mouth daily.        . potassium chloride SA (K-DUR,KLOR-CON) 20 MEQ tablet Take 20 meq twice daily.  60 tablet  6  . ramipril (ALTACE) 10 MG capsule TAKE ONE CAPSULE BY MOUTH DAILY  90 capsule  2  . warfarin  (COUMADIN) 5 MG tablet Take 1 tablet (5 mg total) by mouth as directed.  90 tablet  2  . metolazone (ZAROXOLYN) 5 MG tablet Take 1 tablet (5 mg total) by mouth daily.  90 tablet  3    Allergies  Allergen Reactions  . Procaine Hcl     Unknown reaction    Past Medical History  Diagnosis Date  . S/P CABG (coronary artery bypass graft) 2002    SVG to PDA, Free radial to Intermediate, LIMA to LAD by Dr Donata Clay  . HTN (hypertension)   . Hypercholesteremia   . LV dysfunction     EF 35 to 40% per echo May 2012; EF remains 35 to 40% per echo Feb 2013. Referred for ICD  . Chronic anticoagulation     on coumadin  . 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 High risk medication use     on amiodarone  . Atrial fibrillation     ON AMIODARONE: PER VISIT NOTE IN [facility]GREE [facility]  . Pulmonary hypertension, moderate to severe   . Cataract immature     BILATERAL  . Osteoarthritis of right shoulder region  ACUTE PAIN  . Rotator cuff tear 2012  . Anemia     Referral to GI Feb 2013  . Chronic kidney disease (CKD), stage II (mild)     Past Surgical History  Procedure Date  . Right leg surgeries     due to polio  . Left knee arthroscopy 2000  . Cardiac catheterization 2009    Grafts patent  . Cardiac catheterization 2012    Grafts patent. EF was 25 to 30% but 35 to 40 by echo  . Cardiac catheterization 2003  . Coronary artery bypass graft 2002    X3 VESSEL  . Cardioversion 2008--  FOR PAF    SUCCESSFUL  . Right shoulder rotator cuffe repair 05/16/11    History  Smoking status  . Former Smoker  . Types: Cigarettes  . Quit date: 05/12/1979  Smokeless tobacco  . Never Used  Comment: stopped smoking over 30 years ago.    History  Alcohol Use  . 8.4 oz/week  . 14 Cans of beer per week    couple beers and a shot of crown royal most days    Family History  Problem Relation Age of Onset  . Valvular heart disease Father   . Colon cancer Neg Hx   . Colon polyps Neg Hx    . Rectal cancer Neg Hx   . Stomach cancer Neg Hx     Review of Systems: The review of systems is per the HPI.  All other systems were reviewed and are negative.  Physical Exam: BP 110/70  Pulse 60  Ht 6\' 3"  (1.905 m)  Wt 230 lb (104.327 kg)  BMI 28.75 kg/m2 Patient is very pleasant and in no acute distress. He is obese. Skin is warm and dry. Color is normal.  HEENT is unremarkable. Normocephalic/atraumatic. PERRL. Sclera are nonicteric. Neck is supple. No masses. No significant JVD.. Lungs are clear. Cardiac exam shows an irregular rhythm. His rate is controlled. Abdomen is soft without definite ascites. Extremities reveal trace + edema on the left. He has chronic musculoskeletal deformity of the right lower extremity. Gait and ROM are intact. No gross neurologic deficits noted.   LABORATORY DATA:  INR is pending today.  Assessment / Plan: 1. Chronic congestive heart failure secondary to systolic dysfunction. Ejection fraction of 35%. Clinically he appears to be well compensated. He has not tolerated Aldactone in the past. He does respond well to metolazone when he becomes volume overloaded. We will continue on his current therapy. Continue fluid and sodium restriction.  2. Coronary disease status post CABG in 2002.  3. Atrial fibrillation, in sinus rhythm on amiodarone.  4. Chronic kidney disease stage 2-3. For evaluation by nephrology next week.  5. Anemia of chronic disease.  6. Hypertension.  7. Hyponatremia related to diuresis.

## 2012-02-16 ENCOUNTER — Other Ambulatory Visit (HOSPITAL_BASED_OUTPATIENT_CLINIC_OR_DEPARTMENT_OTHER): Payer: Medicare Other

## 2012-02-16 DIAGNOSIS — D649 Anemia, unspecified: Secondary | ICD-10-CM

## 2012-02-16 LAB — CBC WITH DIFFERENTIAL/PLATELET
Basophils Absolute: 0.1 10*3/uL (ref 0.0–0.1)
EOS%: 15.3 % — ABNORMAL HIGH (ref 0.0–7.0)
Eosinophils Absolute: 0.9 10*3/uL — ABNORMAL HIGH (ref 0.0–0.5)
HCT: 33.7 % — ABNORMAL LOW (ref 38.4–49.9)
HGB: 11.3 g/dL — ABNORMAL LOW (ref 13.0–17.1)
MCH: 30.1 pg (ref 27.2–33.4)
MCV: 89.7 fL (ref 79.3–98.0)
MONO%: 10.9 % (ref 0.0–14.0)
NEUT#: 3.3 10*3/uL (ref 1.5–6.5)
NEUT%: 57.5 % (ref 39.0–75.0)
RDW: 20.4 % — ABNORMAL HIGH (ref 11.0–14.6)
lymph#: 0.9 10*3/uL (ref 0.9–3.3)

## 2012-02-23 ENCOUNTER — Other Ambulatory Visit: Payer: Self-pay | Admitting: Nephrology

## 2012-02-26 ENCOUNTER — Encounter: Payer: Self-pay | Admitting: Cardiology

## 2012-02-26 ENCOUNTER — Telehealth: Payer: Self-pay

## 2012-02-26 NOTE — Telephone Encounter (Signed)
Message copied by Kallie Locks on Mon Feb 26, 2012  3:28 PM ------      Message from: Jason Choi      Created: Tue Feb 20, 2012  9:08 AM       Please call patient. His hemoglobin has improved. His anemia is from anemia of chronic kidney disease. I recommend to continue observation at this time.

## 2012-03-01 ENCOUNTER — Ambulatory Visit
Admission: RE | Admit: 2012-03-01 | Discharge: 2012-03-01 | Disposition: A | Discharge status: Home / Self Care | Payer: Medicare Other | Source: Ambulatory Visit | Attending: Nephrology | Admitting: Nephrology

## 2012-03-15 ENCOUNTER — Ambulatory Visit (INDEPENDENT_AMBULATORY_CARE_PROVIDER_SITE_OTHER): Payer: Medicare Other | Admitting: *Deleted

## 2012-03-15 DIAGNOSIS — I4891 Unspecified atrial fibrillation: Secondary | ICD-10-CM

## 2012-03-15 DIAGNOSIS — Z7901 Long term (current) use of anticoagulants: Secondary | ICD-10-CM

## 2012-03-19 ENCOUNTER — Other Ambulatory Visit: Payer: Self-pay | Admitting: Cardiology

## 2012-04-19 ENCOUNTER — Telehealth: Payer: Self-pay | Admitting: *Deleted

## 2012-04-19 ENCOUNTER — Other Ambulatory Visit (HOSPITAL_BASED_OUTPATIENT_CLINIC_OR_DEPARTMENT_OTHER): Payer: Medicare Other | Admitting: Lab

## 2012-04-19 DIAGNOSIS — D649 Anemia, unspecified: Secondary | ICD-10-CM

## 2012-04-19 LAB — CBC WITH DIFFERENTIAL/PLATELET
Basophils Absolute: 0.1 10*3/uL (ref 0.0–0.1)
Eosinophils Absolute: 0.3 10*3/uL (ref 0.0–0.5)
HCT: 37.7 % — ABNORMAL LOW (ref 38.4–49.9)
HGB: 12.8 g/dL — ABNORMAL LOW (ref 13.0–17.1)
LYMPH%: 14.4 % (ref 14.0–49.0)
MONO#: 0.6 10*3/uL (ref 0.1–0.9)
NEUT%: 67 % (ref 39.0–75.0)
Platelets: 172 10*3/uL (ref 140–400)
WBC: 5.5 10*3/uL (ref 4.0–10.3)
lymph#: 0.8 10*3/uL — ABNORMAL LOW (ref 0.9–3.3)

## 2012-04-19 NOTE — Telephone Encounter (Signed)
Called pt w/ lab results,  Informed of anemia improved.  Keep next appt as scheduled in January.  Pt verbalized understanding.

## 2012-04-19 NOTE — Telephone Encounter (Signed)
Message copied by Wende Mott on Fri Apr 19, 2012  2:57 PM ------      Message from: Kallie Locks      Created: Fri Apr 19, 2012 12:57 PM                   ----- Message -----         From: Exie Parody, MD         Sent: 04/19/2012  10:56 AM           To: Marcell Barlow, RN            Please call pt.  His anemia of chronic kidney disease is improved.  Continue observation.  Thanks.

## 2012-04-22 ENCOUNTER — Ambulatory Visit (INDEPENDENT_AMBULATORY_CARE_PROVIDER_SITE_OTHER): Payer: Medicare Other | Admitting: *Deleted

## 2012-04-22 DIAGNOSIS — Z7901 Long term (current) use of anticoagulants: Secondary | ICD-10-CM

## 2012-04-22 DIAGNOSIS — I4891 Unspecified atrial fibrillation: Secondary | ICD-10-CM

## 2012-05-14 ENCOUNTER — Other Ambulatory Visit: Payer: Self-pay | Admitting: *Deleted

## 2012-05-14 MED ORDER — ATORVASTATIN CALCIUM 40 MG PO TABS: 40.0000 mg | ORAL_TABLET | Freq: Every day | ORAL | Status: DC

## 2012-05-17 ENCOUNTER — Ambulatory Visit (INDEPENDENT_AMBULATORY_CARE_PROVIDER_SITE_OTHER): Payer: Medicare Other | Admitting: Cardiology

## 2012-05-17 ENCOUNTER — Encounter: Payer: Self-pay | Admitting: Cardiology

## 2012-05-17 ENCOUNTER — Other Ambulatory Visit: Payer: Self-pay

## 2012-05-17 VITALS — BP 102/52 | HR 64 | Ht 75.0 in | Wt 237.8 lb

## 2012-05-17 DIAGNOSIS — I5023 Acute on chronic systolic (congestive) heart failure: Secondary | ICD-10-CM

## 2012-05-17 DIAGNOSIS — I4891 Unspecified atrial fibrillation: Secondary | ICD-10-CM

## 2012-05-17 DIAGNOSIS — Z7901 Long term (current) use of anticoagulants: Secondary | ICD-10-CM

## 2012-05-17 DIAGNOSIS — I509 Heart failure, unspecified: Secondary | ICD-10-CM

## 2012-05-17 DIAGNOSIS — I10 Essential (primary) hypertension: Secondary | ICD-10-CM

## 2012-05-17 DIAGNOSIS — E7800 Pure hypercholesterolemia, unspecified: Secondary | ICD-10-CM

## 2012-05-17 DIAGNOSIS — N183 Chronic kidney disease, stage 3 unspecified: Secondary | ICD-10-CM

## 2012-05-17 DIAGNOSIS — I2510 Atherosclerotic heart disease of native coronary artery without angina pectoris: Secondary | ICD-10-CM

## 2012-05-17 DIAGNOSIS — R0602 Shortness of breath: Secondary | ICD-10-CM

## 2012-05-17 NOTE — Progress Notes (Signed)
Jason Choi Date of Birth: [birth year] Medical Record #[id number]  History of Present Illness: Jason Choi is seen today for a follow up of his congestive heart failure.  He has not tolerated aldactone or higher doses of Coreg due to orthostasis. He had his CABG back in 2002. He is on amiodarone for his PAF as well as his coumadin. Has pulmonary HTN as well. He has anemia and has been followed by Dr. Gaylyn Rong. It is felt that he has anemia of chronic disease. He was seen by EP back in March for consideration of an ICD. His repeat echo showed an EF of 35 to 40% and given the severe biventricular enlargement, recent MRI demonstrated an ejection fraction of 34% with marked left ventricular enlargement and global hypokinesis. He has deferred defibrillator placement.  On followup today he reports that he is felt the best he has a year. He can walk without any dyspnea. He denies any chest pain or palpitations. He has had no dizziness. He was evaluated by renal his creatinine at that time was 1.48. His last hemoglobin was up to 12.8. He has been out of town for most of the last month and his diet has suffered. As a result he has gained about 7 pounds. He denies any increased edema or dyspnea.   Current Outpatient Prescriptions on File Prior to Visit  Medication Sig Dispense Refill  . amiodarone (PACERONE) 200 MG tablet Take 200 mg by mouth daily.       Marland Kitchen aspirin 81 MG tablet Take 81 mg by mouth daily.       Marland Kitchen atorvastatin (LIPITOR) 40 MG tablet Take 1 tablet (40 mg total) by mouth daily.  30 tablet  6  . carvedilol (COREG) 25 MG tablet TAKE ONE-HALF TABLET (12.5 MG TOTAL) BY MOUTH TWO TIMES DAILY WITH A MEAL.  90 tablet  1  . fish oil-omega-3 fatty acids 1000 MG capsule Take 1 g by mouth as needed.       . furosemide (LASIX) 80 MG tablet Take 1 tablet (80 mg total) by mouth 2 (two) times daily.  180 tablet  3  . Multiple Vitamin (MULTIVITAMIN) tablet Take 1 tablet by mouth daily.        . potassium chloride SA  (K-DUR,KLOR-CON) 20 MEQ tablet Take 20 meq twice daily.  60 tablet  6  . ramipril (ALTACE) 10 MG capsule TAKE ONE CAPSULE BY MOUTH DAILY  90 capsule  2  . warfarin (COUMADIN) 5 MG tablet Take 1 tablet (5 mg total) by mouth as directed.  90 tablet  2    Allergies  Allergen Reactions  . Procaine Hcl     Unknown reaction    Past Medical History  Diagnosis Date  . S/P CABG (coronary artery bypass graft) 2002    SVG to PDA, Free radial to Intermediate, LIMA to LAD by Dr Donata Clay  . HTN (hypertension)   . Hypercholesteremia   . LV dysfunction     EF 35 to 40% per echo May 2012; EF remains 35 to 40% per echo Feb 2013. Referred for ICD  . Chronic anticoagulation     on coumadin  . 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 High risk medication use     on amiodarone  . Atrial fibrillation     ON AMIODARONE: PER VISIT NOTE IN [facility] FIRST DEGREE [facility]  . Pulmonary hypertension, moderate to severe   . Cataract immature     BILATERAL  .  Osteoarthritis of right shoulder region     ACUTE PAIN  . Rotator cuff tear 2012  . Anemia     Referral to GI Feb 2013  . Chronic kidney disease (CKD), stage II (mild)     Past Surgical History  Procedure Date  . Right leg surgeries     due to polio  . Left knee arthroscopy 2000  . Cardiac catheterization 2009    Grafts patent  . Cardiac catheterization 2012    Grafts patent. EF was 25 to 30% but 35 to 40 by echo  . Cardiac catheterization 2003  . Coronary artery bypass graft 2002    X3 VESSEL  . Cardioversion 2008--  FOR PAF    SUCCESSFUL  . Right shoulder rotator cuffe repair 05/16/11    History  Smoking status  . Former Smoker  . Types: Cigarettes  . Quit date: 05/12/1979  Smokeless tobacco  . Never Used    Comment: stopped smoking over 30 years ago.    History  Alcohol Use  . 8.4 oz/week  . 14 Cans of beer per week    Comment: couple beers and a shot of crown royal most days    Family History  Problem Relation Age of Onset  .  Valvular heart disease Father   . Colon cancer Neg Hx   . Colon polyps Neg Hx   . Rectal cancer Neg Hx   . Stomach cancer Neg Hx     Review of Systems: The review of systems is per the HPI.  All other systems were reviewed and are negative.  Physical Exam: BP 102/52  Pulse 64  Ht 6\' 3"  (1.905 m)  Wt 237 lb 12.8 oz (107.865 kg)  BMI 29.72 kg/m2 Patient is very pleasant and in no acute distress. He is obese. Skin is warm and dry. Color is normal.  HEENT is unremarkable. Normocephalic/atraumatic. PERRL. Sclera are nonicteric. Neck is supple. No masses. No significant JVD.. Lungs are clear. Cardiac exam shows an irregular rhythm. His rate is controlled. Abdomen is soft without definite ascites. Extremities reveal trace + edema on the left. He has chronic musculoskeletal deformity of the right lower extremity. Gait and ROM are intact. No gross neurologic deficits noted.   LABORATORY DATA:  INR is pending today.  Assessment / Plan: 1. Chronic congestive heart failure secondary to systolic dysfunction. Ejection fraction of 35%. Clinically he appears to be well compensated but has gained weight. He has not tolerated Aldactone in the past. He does respond well to metolazone when he becomes volume overloaded. We will continue on his current therapy. Continue fluid and sodium restriction. I recommended he take metolazone for 2 days to try and get his weight back down. He is going to try harder with his diet. I'll followup again in 3 months and we will check complete lab work at that time.  2. Coronary disease status post CABG in 2002.  3. Atrial fibrillation, in sinus rhythm on amiodarone.  4. Chronic kidney disease stage 2-3.   5. Anemia of chronic disease.  6. Hypertension.  7. Hyponatremia related to diuresis.

## 2012-05-17 NOTE — Patient Instructions (Signed)
Take extra metolazone for 2 days and lets get your weight back down.  Continue your other therapy.  I will see you in 3 months with lab work.

## 2012-05-30 ENCOUNTER — Ambulatory Visit (INDEPENDENT_AMBULATORY_CARE_PROVIDER_SITE_OTHER): Payer: Medicare Other | Admitting: *Deleted

## 2012-05-30 DIAGNOSIS — Z7901 Long term (current) use of anticoagulants: Secondary | ICD-10-CM

## 2012-05-30 DIAGNOSIS — I4891 Unspecified atrial fibrillation: Secondary | ICD-10-CM

## 2012-05-30 LAB — POCT INR: INR: 1.4

## 2012-06-13 ENCOUNTER — Ambulatory Visit (INDEPENDENT_AMBULATORY_CARE_PROVIDER_SITE_OTHER): Payer: Medicare Other | Admitting: *Deleted

## 2012-06-13 DIAGNOSIS — I4891 Unspecified atrial fibrillation: Secondary | ICD-10-CM

## 2012-06-13 DIAGNOSIS — Z7901 Long term (current) use of anticoagulants: Secondary | ICD-10-CM

## 2012-06-13 LAB — POCT INR: INR: 2

## 2012-06-14 ENCOUNTER — Telehealth: Payer: Self-pay

## 2012-06-21 ENCOUNTER — Other Ambulatory Visit: Payer: Medicare Other | Admitting: Lab

## 2012-06-21 ENCOUNTER — Encounter: Payer: Self-pay | Admitting: Oncology

## 2012-06-21 ENCOUNTER — Ambulatory Visit (HOSPITAL_BASED_OUTPATIENT_CLINIC_OR_DEPARTMENT_OTHER): Payer: Medicare Other | Admitting: Oncology

## 2012-06-21 ENCOUNTER — Telehealth: Payer: Self-pay | Admitting: Oncology

## 2012-06-21 VITALS — BP 92/58 | HR 69 | Temp 97.6°F | Resp 20 | Ht 75.0 in | Wt 230.6 lb

## 2012-06-21 DIAGNOSIS — N182 Chronic kidney disease, stage 2 (mild): Secondary | ICD-10-CM

## 2012-06-21 DIAGNOSIS — I2789 Other specified pulmonary heart diseases: Secondary | ICD-10-CM

## 2012-06-21 DIAGNOSIS — D631 Anemia in chronic kidney disease: Secondary | ICD-10-CM

## 2012-06-21 DIAGNOSIS — D649 Anemia, unspecified: Secondary | ICD-10-CM

## 2012-06-21 DIAGNOSIS — I4891 Unspecified atrial fibrillation: Secondary | ICD-10-CM

## 2012-06-21 LAB — FERRITIN: Ferritin: 284 ng/mL (ref 22–322)

## 2012-06-21 LAB — CBC WITH DIFFERENTIAL/PLATELET
BASO%: 1.9 % (ref 0.0–2.0)
Basophils Absolute: 0.1 10*3/uL (ref 0.0–0.1)
EOS%: 9.6 % — ABNORMAL HIGH (ref 0.0–7.0)
HCT: 36.7 % — ABNORMAL LOW (ref 38.4–49.9)
HGB: 12.7 g/dL — ABNORMAL LOW (ref 13.0–17.1)
LYMPH%: 15.9 % (ref 14.0–49.0)
MCH: 33.1 pg (ref 27.2–33.4)
MCHC: 34.7 g/dL (ref 32.0–36.0)
MCV: 95.5 fL (ref 79.3–98.0)
MONO%: 10.5 % (ref 0.0–14.0)
NEUT%: 62.1 % (ref 39.0–75.0)
Platelets: 180 10*3/uL (ref 140–400)

## 2012-06-21 LAB — COMPREHENSIVE METABOLIC PANEL (CC13)
AST: 34 U/L (ref 5–34)
Alkaline Phosphatase: 85 U/L (ref 40–150)
BUN: 64.5 mg/dL — ABNORMAL HIGH (ref 7.0–26.0)
Creatinine: 2.2 mg/dL — ABNORMAL HIGH (ref 0.7–1.3)
Total Bilirubin: 0.59 mg/dL (ref 0.20–1.20)

## 2012-06-21 LAB — IRON AND TIBC
TIBC: 375 ug/dL (ref 215–435)
UIBC: 243 ug/dL (ref 125–400)

## 2012-06-21 LAB — MORPHOLOGY

## 2012-06-21 NOTE — Telephone Encounter (Signed)
appts made and printed for pt aom °

## 2012-06-21 NOTE — Progress Notes (Signed)
[facility]  [phone] [phone]   OFFICE PROGRESS NOTE   Cc:  BURNETT,BRENT A, MD  DIAGNOSIS: Anemia due to chronic kidney disease  CURRENT THERAPY: Watchful observation  INTERVAL HISTORY: Jason Choi 72 y.o. male returns for routine follow-up by himself. Reports that he has continue to do well. He had to take Zaroxolyn for a period of time due to fluid overload. He is now off of this. Denies chest pain, shortness of breath, and dyspnea. No bleeding noted. States he has been seen by Dr Allena Katz of Nephrology and no new interventions are needed at this time.  Past Medical History  Diagnosis Date  . S/P CABG (coronary artery bypass graft) 2002    SVG to PDA, Free radial to Intermediate, LIMA to LAD by Dr Donata Clay  . HTN (hypertension)   . Hypercholesteremia   . LV dysfunction     EF 35 to 40% per echo May 2012; EF remains 35 to 40% per echo Feb 2013. Referred for ICD  . Chronic anticoagulation     on coumadin  . 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 High risk medication use     on amiodarone  . Atrial fibrillation     ON AMIODARONE: PER VISIT NOTE IN S[facility] FIRST DEGREE HB  . Pulmonary hypertension, moderate to severe   . Cataract immature     BILATERAL  . Osteoarthritis of right shoulder region     ACUTE PAIN  . Rotator cuff tear 2012  . Anemia     Referral to GI Feb 2013  . Chronic kidney disease (CKD), stage II (mild)     Past Surgical History  Procedure Date  . Right leg surgeries     due to polio  . Left knee arthroscopy 2000  . Cardiac catheterization 2009    Grafts patent  . Cardiac catheterization 2012    Grafts patent. EF was 25 to 30% but 35 to 40 by echo  . Cardiac catheterization 2003  . Coronary artery bypass graft 2002    X3 VESSEL  . Cardioversion 2008--  FOR PAF    SUCCESSFUL  . Right shoulder rotator cuffe repair 05/16/11    Current Outpatient Prescriptions  Medication Sig Dispense Refill  .  amiodarone (PACERONE) 200 MG tablet Take 200 mg by mouth daily.       Marland Kitchen aspirin 81 MG tablet Take 81 mg by mouth daily.       Marland Kitchen atorvastatin (LIPITOR) 40 MG tablet Take 1 tablet (40 mg total) by mouth daily.  30 tablet  6  . carvedilol (COREG) 25 MG tablet TAKE ONE-HALF TABLET (12.5 MG TOTAL) BY MOUTH TWO TIMES DAILY WITH A MEAL.  90 tablet  1  . fish oil-omega-3 fatty acids 1000 MG capsule Take 1 g by mouth as needed.       . furosemide (LASIX) 80 MG tablet Take 1 tablet (80 mg total) by mouth 2 (two) times daily.  180 tablet  3  . Multiple Vitamin (MULTIVITAMIN) tablet Take 1 tablet by mouth daily.        . potassium chloride SA (K-DUR,KLOR-CON) 20 MEQ tablet Take 20 meq twice daily.  60 tablet  6  . ramipril (ALTACE) 10 MG capsule TAKE ONE CAPSULE BY MOUTH DAILY  90 capsule  2  . warfarin (COUMADIN) 5 MG tablet Take 1 tablet (5 mg total) by mouth as directed.  90 tablet  2  ALLERGIES:  is allergic to procaine hcl.  REVIEW OF SYSTEMS:  The rest of the 14-point review of system was negative.   Filed Vitals:   06/21/12 0858  BP: 92/58  Pulse: 69  Temp: 97.6 F (36.4 C)  Resp: 20   Wt Readings from Last 3 Encounters:  06/21/12 230 lb 9.6 oz (104.599 kg)  05/17/12 237 lb 12.8 oz (107.865 kg)  02/15/12 230 lb (104.327 kg)   ECOG Performance status: 1  PHYSICAL EXAMINATION:   General: Mildly obese man, in no acute distress. Eyes: no scleral icterus. ENT: There were no oropharyngeal lesions. Neck was without thyromegaly. Lymphatics: Negative cervical, supraclavicular or axillary adenopathy. Respiratory: lungs were clear bilaterally without wheezing or crackles. Cardiovascular: Regular rate and rhythm, S1/S2, without murmur, rub or gallop. There was trace bilateral pedal edema. GI: abdomen was soft, distended, positive for fluid wave, nontender, without organomegaly. Muscoloskeletal: no spinal tenderness of palpation of vertebral spine. Skin exam was without echymosis, petichae. Neuro  exam was nonfocal. Patient was able to get on and off exam table without assistance. Gait was normal. Patient was alerted and oriented. Attention was good. Language was appropriate. Mood was normal without depression. Speech was not pressured. Thought content was not tangential.    LABORATORY/RADIOLOGY DATA:  Lab Results  Component Value Date   WBC 5.2 06/21/2012   HGB 12.7* 06/21/2012   HCT 36.7* 06/21/2012   PLT 180 06/21/2012   GLUCOSE 124* 01/17/2012   ALKPHOS 77 12/11/2011   ALT 32 12/11/2011   AST 51* 12/11/2011   NA 132* 01/17/2012   K 3.4* 01/17/2012   CL 94* 01/17/2012   CREATININE 1.7* 01/17/2012   BUN 44* 01/17/2012   CO2 27 01/17/2012   INR 2.0 06/13/2012    ASSESSMENT AND PLAN:   1. Hypertension: Well controlled on ramipril and carvedilol per PCP.  2. Hyperlipidemia: On atorvastatin per PCP.  3. Congestive heart failure: Slight fluid overload with abdominal ascites. He is in Lasix; dose cannot be increased further due to borderline blood pressure. He is on amiodarone, ramipril and carvedilol.  4. History of paroxysmal afib: He is rate and rhythm controlled today. He is on Coumadin.  5. Pulmonary hypertension.  6. Chronic kidney disease, stage II: Most likely due to chronic hypertension. Work-up for myeloma has been negative in the past.  7. Anemia of chronic kidney disease: Past work up did not show iron/Vit B12 deficiency or thyroid dysfunction. No evidence of myeloma on previous work-up. In the future, if anemia significantly worsens, a bone marrow biopsy may be considered to rule out primary bone marrow state that can result in anemia (Myelodysplastic syndrome MDS or pure red cell aplasia).  - Treatment: Continue observation and transfusion of paced red blood cell for Hgb <8. If he requires frequent blood transfusion, we may consider Aranesp to potentially increase Hgb and decrease need for frequent blood transfusion. Aranesp can potentially lightly increase in risk of stroke and  heart attack. Therefore, I reserve Aranesp for patients with anemia of CKD who requires frequent RBC transfusion.  8. Follow up: Lab at the [facility] in about 3 and 6 months. Return visit in about 9 months.     The length of time of the face-to-face encounter was 15 minutes. More than 50% of time was spent counseling and coordination of care.

## 2012-06-24 ENCOUNTER — Other Ambulatory Visit: Payer: Self-pay | Admitting: Oncology

## 2012-06-24 ENCOUNTER — Other Ambulatory Visit: Payer: Self-pay

## 2012-06-24 MED ORDER — RAMIPRIL 10 MG PO CAPS: 10.0000 mg | ORAL_CAPSULE | Freq: Every day | ORAL | Status: DC

## 2012-06-24 MED ORDER — AMIODARONE HCL 200 MG PO TABS: 200.0000 mg | ORAL_TABLET | Freq: Every day | ORAL | Status: DC

## 2012-06-24 MED ORDER — WARFARIN SODIUM 5 MG PO TABS: 5.0000 mg | ORAL_TABLET | ORAL | Status: DC

## 2012-07-02 NOTE — Telephone Encounter (Signed)
Refilled by coumadin clinic

## 2012-07-18 ENCOUNTER — Ambulatory Visit (INDEPENDENT_AMBULATORY_CARE_PROVIDER_SITE_OTHER): Payer: Medicare Other | Admitting: *Deleted

## 2012-07-18 ENCOUNTER — Telehealth: Payer: Self-pay | Admitting: Cardiology

## 2012-07-18 DIAGNOSIS — I4891 Unspecified atrial fibrillation: Secondary | ICD-10-CM

## 2012-07-18 DIAGNOSIS — Z7901 Long term (current) use of anticoagulants: Secondary | ICD-10-CM

## 2012-07-18 NOTE — Telephone Encounter (Signed)
Spoke with patient he stated he has been dizzy for the past 3 days.States blood pressure sitting 30 mins ago 75/50 p 62,standing 65/46.Patient stated he has already taken all of his morning medication.Spoke to Dr.Jordan he advised to decrease lasix to 80 mg daily for the next 3 days then take 80 mg am 40 mg pm.Advised to monitor blood pressure and call back if not better.

## 2012-07-18 NOTE — Telephone Encounter (Signed)
Pt has been dizzy, light headed for the past 3 days and wants to talk to you about that

## 2012-08-15 ENCOUNTER — Ambulatory Visit (INDEPENDENT_AMBULATORY_CARE_PROVIDER_SITE_OTHER): Payer: Medicare Other

## 2012-08-15 ENCOUNTER — Ambulatory Visit (INDEPENDENT_AMBULATORY_CARE_PROVIDER_SITE_OTHER): Payer: Medicare Other | Admitting: Cardiology

## 2012-08-15 ENCOUNTER — Encounter: Payer: Self-pay | Admitting: Cardiology

## 2012-08-15 ENCOUNTER — Other Ambulatory Visit (INDEPENDENT_AMBULATORY_CARE_PROVIDER_SITE_OTHER): Payer: Medicare Other

## 2012-08-15 VITALS — BP 112/62 | HR 63 | Ht 75.0 in | Wt 233.8 lb

## 2012-08-15 DIAGNOSIS — I509 Heart failure, unspecified: Secondary | ICD-10-CM

## 2012-08-15 DIAGNOSIS — E7800 Pure hypercholesterolemia, unspecified: Secondary | ICD-10-CM

## 2012-08-15 DIAGNOSIS — I2510 Atherosclerotic heart disease of native coronary artery without angina pectoris: Secondary | ICD-10-CM

## 2012-08-15 DIAGNOSIS — I5023 Acute on chronic systolic (congestive) heart failure: Secondary | ICD-10-CM

## 2012-08-15 DIAGNOSIS — I4891 Unspecified atrial fibrillation: Secondary | ICD-10-CM

## 2012-08-15 DIAGNOSIS — Z7901 Long term (current) use of anticoagulants: Secondary | ICD-10-CM

## 2012-08-15 DIAGNOSIS — R0602 Shortness of breath: Secondary | ICD-10-CM

## 2012-08-15 DIAGNOSIS — I10 Essential (primary) hypertension: Secondary | ICD-10-CM

## 2012-08-15 DIAGNOSIS — R0989 Other specified symptoms and signs involving the circulatory and respiratory systems: Secondary | ICD-10-CM

## 2012-08-15 DIAGNOSIS — E871 Hypo-osmolality and hyponatremia: Secondary | ICD-10-CM

## 2012-08-15 LAB — BASIC METABOLIC PANEL
BUN: 92 mg/dL (ref 6–23)
CO2: 27 mEq/L (ref 19–32)
GFR: 22.46 mL/min — ABNORMAL LOW (ref >60.00)
Glucose, Bld: 118 mg/dL — ABNORMAL HIGH (ref 70–99)
Potassium: 4.6 mEq/L (ref 3.5–5.1)

## 2012-08-15 LAB — HEPATIC FUNCTION PANEL
ALT: 39 U/L (ref 0–53)
Bilirubin, Direct: 0.1 mg/dL (ref 0.0–0.3)
Total Bilirubin: 0.9 mg/dL (ref 0.3–1.2)

## 2012-08-15 LAB — CBC WITH DIFFERENTIAL/PLATELET
Basophils Relative: 1.2 % (ref 0.0–3.0)
Eosinophils Absolute: 0.4 10*3/uL (ref 0.0–0.7)
Eosinophils Relative: 7.4 % — ABNORMAL HIGH (ref 0.0–5.0)
HCT: 35.1 % — ABNORMAL LOW (ref 39.0–52.0)
Hemoglobin: 11.9 g/dL — ABNORMAL LOW (ref 13.0–17.0)
Lymphs Abs: 1 10*3/uL (ref 0.7–4.0)
MCHC: 33.9 g/dL (ref 30.0–36.0)
MCV: 95.8 fl (ref 78.0–100.0)
Monocytes Absolute: 0.8 10*3/uL (ref 0.1–1.0)
Neutro Abs: 3.5 10*3/uL (ref 1.4–7.7)
Neutrophils Relative %: 59.5 % (ref 43.0–77.0)
RBC: 3.67 Mil/uL — ABNORMAL LOW (ref 4.22–5.81)
WBC: 5.8 10*3/uL (ref 4.5–10.5)

## 2012-08-15 LAB — LIPID PANEL
Total CHOL/HDL Ratio: 4
VLDL: 13.8 mg/dL (ref 0.0–40.0)

## 2012-08-15 NOTE — Progress Notes (Signed)
Jason Choi Date of Birth: March 12, 1941 Medical Record #[id number]  History of Present Illness: Jason Choi is seen today for a follow up of his congestive heart failure.   He had his CABG back in 2002. He is on amiodarone for his PAF as well as his coumadin. Has pulmonary HTN as well. He has anemia and has been followed by hematology. It is felt that he has anemia of chronic disease. He was seen by EP back in March for consideration of an ICD. His repeat echo showed an EF of 35 to 40% and given the severe biventricular enlargement, recent MRI demonstrated an ejection fraction of 34% with marked left ventricular enlargement and global hypokinesis. He has deferred defibrillator placement.  On followup today he reports episodes of profound hypotension with standing. His blood pressure dropped to 70 systolic. We initially reduced his Lasix dose and then his carvedilol. He is still now taking Lasix 80 mg twice a day. His Coreg has been reduced to 12.5 mg in the evening. He seems to be tolerating this well. His hypotension has resolved. His weight is stable.   Current Outpatient Prescriptions on File Prior to Visit  Medication Sig Dispense Refill  . amiodarone (PACERONE) 200 MG tablet Take 1 tablet (200 mg total) by mouth daily.  90 tablet  1  . aspirin 81 MG tablet Take 81 mg by mouth daily.       Marland Kitchen atorvastatin (LIPITOR) 40 MG tablet Take 1 tablet (40 mg total) by mouth daily.  30 tablet  6  . carvedilol (COREG) 25 MG tablet TAKE ONE-HALF TABLET (12.5 MG TOTAL) BY MOUTH TWO TIMES DAILY WITH A MEAL.  90 tablet  1  . fish oil-omega-3 fatty acids 1000 MG capsule Take 1 g by mouth as needed.       . furosemide (LASIX) 80 MG tablet Take 1 tablet (80 mg total) by mouth 2 (two) times daily.  180 tablet  3  . Multiple Vitamin (MULTIVITAMIN) tablet Take 1 tablet by mouth daily.        . potassium chloride SA (K-DUR,KLOR-CON) 20 MEQ tablet Take 20 meq twice daily.  60 tablet  6  . ramipril (ALTACE) 10 MG capsule  Take 1 capsule (10 mg total) by mouth daily.  90 capsule  1  . warfarin (COUMADIN) 5 MG tablet Take 1 tablet (5 mg total) by mouth as directed.  90 tablet  1   No current facility-administered medications on file prior to visit.    Allergies  Allergen Reactions  . Procaine Hcl     Unknown reaction    Past Medical History  Diagnosis Date  . S/P CABG (coronary artery bypass graft) 2002    SVG to PDA, Free radial to Intermediate, LIMA to LAD by Dr Donata Clay  . HTN (hypertension)   . Hypercholesteremia   . LV dysfunction     EF 35 to 40% per echo May 2012; EF remains 35 to 40% per echo Feb 2013. Referred for ICD  . Chronic anticoagulation     on coumadin  . 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 High risk medication use     on amiodarone  . Atrial fibrillation     ON AMIODARONE: PER VISIT NOTE IN S[facility] FIRST DEGREE HB  . Pulmonary hypertension, moderate to severe   . Cataract immature     BILATERAL  . Osteoarthritis of right shoulder region     ACUTE PAIN  . Rotator  cuff tear 2012  . Anemia     Referral to GI Feb 2013  . Chronic kidney disease (CKD), stage II (mild)     Past Surgical History  Procedure Laterality Date  . Right leg surgeries      due to polio  . Left knee arthroscopy  2000  . Cardiac catheterization  2009    Grafts patent  . Cardiac catheterization  2012    Grafts patent. EF was 25 to 30% but 35 to 40 by echo  . Cardiac catheterization  2003  . Coronary artery bypass graft  2002    X3 VESSEL  . Cardioversion  2008--  FOR PAF    SUCCESSFUL  . Right shoulder rotator cuffe repair  05/16/11    History  Smoking status  . Former Smoker  . Types: Cigarettes  . Quit date: 05/12/1979  Smokeless tobacco  . Never Used    Comment: stopped smoking over 30 years ago.    History  Alcohol Use  . 8.4 oz/week  . 14 Cans of beer per week    Comment: couple beers and a shot of crown royal most days    Family History  Problem Relation Age of Onset  .  Valvular heart disease Father   . Colon cancer Neg Hx   . Colon polyps Neg Hx   . Rectal cancer Neg Hx   . Stomach cancer Neg Hx     Review of Systems: The review of systems is per the HPI.  All other systems were reviewed and are negative.  Physical Exam: BP 112/62  Pulse 63  Ht 6\' 3"  (1.905 m)  Wt 233 lb 12.8 oz (106.051 kg)  BMI 29.22 kg/m2  SpO2 96% Patient is very pleasant and in no acute distress. He is obese. Skin is warm and dry. Color is normal.  HEENT is unremarkable. Normocephalic/atraumatic. PERRL. Sclera are nonicteric. Neck is supple. No masses. No significant JVD.. Lungs are clear. Cardiac exam shows an irregular rhythm. His rate is controlled. Abdomen is soft without definite ascites. Extremities reveal trace + edema on the left. He has chronic musculoskeletal deformity of the right lower extremity. Gait and ROM are intact. No gross neurologic deficits noted.   LABORATORY DATA:  INR is pending today.  Assessment / Plan: 1. Chronic congestive heart failure secondary to systolic dysfunction. Ejection fraction of 35%. Clinically he appears to be well compensated. Weight is back down 5 pounds. He has not tolerated Aldactone in the past. We have reduced his carvedilol dose to 12.5 mg in the evening. If he continues to have hypotension we may need to reduce his Altace dose. I'll followup again in 3 months. We will check complete lab work today.  2. Coronary disease status post CABG in 2002.  3. Atrial fibrillation, in sinus rhythm on amiodarone. INR is 2.3 today on Coumadin.  4. Chronic kidney disease stage 2-3.   5. Anemia of chronic disease.  6. Hypertension.  7. Hyponatremia related to diuresis.

## 2012-08-15 NOTE — Patient Instructions (Signed)
Continue your current medications  Let me know if you continue to have low BP readings.  I will see you in 3 months.

## 2012-08-16 ENCOUNTER — Other Ambulatory Visit: Payer: Self-pay

## 2012-08-16 DIAGNOSIS — I10 Essential (primary) hypertension: Secondary | ICD-10-CM

## 2012-08-16 MED ORDER — RAMIPRIL 5 MG PO CAPS: 5.0000 mg | ORAL_CAPSULE | Freq: Every day | ORAL | Status: DC

## 2012-08-23 ENCOUNTER — Other Ambulatory Visit: Payer: Medicare Other

## 2012-08-30 ENCOUNTER — Other Ambulatory Visit (INDEPENDENT_AMBULATORY_CARE_PROVIDER_SITE_OTHER): Payer: Medicare Other

## 2012-08-30 DIAGNOSIS — I10 Essential (primary) hypertension: Secondary | ICD-10-CM

## 2012-08-30 LAB — BASIC METABOLIC PANEL
BUN: 38 mg/dL — ABNORMAL HIGH (ref 6–23)
CO2: 25 mEq/L (ref 19–32)
Calcium: 8.8 mg/dL (ref 8.4–10.5)
GFR: 44.38 mL/min — ABNORMAL LOW (ref >60.00)
Glucose, Bld: 161 mg/dL — ABNORMAL HIGH (ref 70–99)

## 2012-09-12 ENCOUNTER — Other Ambulatory Visit: Payer: Self-pay | Admitting: Cardiology

## 2012-09-17 MED ORDER — AMIODARONE HCL 200 MG PO TABS: 200.0000 mg | ORAL_TABLET | Freq: Every day | ORAL | Status: DC

## 2012-09-17 MED ORDER — ATORVASTATIN CALCIUM 40 MG PO TABS: 40.0000 mg | ORAL_TABLET | Freq: Every day | ORAL | Status: DC

## 2012-09-17 NOTE — Telephone Encounter (Signed)
Spoke with patient he stated he only needs refills for atorvastatin,amiodarone 90 day supply sent to pharmacy.

## 2012-09-20 ENCOUNTER — Other Ambulatory Visit (HOSPITAL_BASED_OUTPATIENT_CLINIC_OR_DEPARTMENT_OTHER): Payer: Medicare Other | Admitting: Lab

## 2012-09-20 DIAGNOSIS — N182 Chronic kidney disease, stage 2 (mild): Secondary | ICD-10-CM

## 2012-09-20 DIAGNOSIS — D631 Anemia in chronic kidney disease: Secondary | ICD-10-CM

## 2012-09-20 DIAGNOSIS — D649 Anemia, unspecified: Secondary | ICD-10-CM

## 2012-09-20 LAB — CBC WITH DIFFERENTIAL/PLATELET
Basophils Absolute: 0.1 10*3/uL (ref 0.0–0.1)
EOS%: 7 % (ref 0.0–7.0)
Eosinophils Absolute: 0.4 10*3/uL (ref 0.0–0.5)
HCT: 34.7 % — ABNORMAL LOW (ref 38.4–49.9)
HGB: 11.8 g/dL — ABNORMAL LOW (ref 13.0–17.1)
MCH: 33.2 pg (ref 27.2–33.4)
NEUT#: 3.4 10*3/uL (ref 1.5–6.5)
NEUT%: 65.4 % (ref 39.0–75.0)
RDW: 13.8 % (ref 11.0–14.6)
lymph#: 0.9 10*3/uL (ref 0.9–3.3)

## 2012-10-02 ENCOUNTER — Other Ambulatory Visit: Payer: Self-pay | Admitting: Cardiology

## 2012-10-03 NOTE — Telephone Encounter (Signed)
3. Atrial fibrillation, in sinus rhythm on amiodarone. INR is 2.3 today on Coumadin.  Patient Instructions  Continue your current medications  Let me know if you continue to have low BP readings.  I will see you in 3 months.  Chart Reviewed By  Charna Elizabeth, LPN  on 1/61/0960  9:13 AM  Exie Parody, MD  on 08/15/2012  9:34 AM  Hartley Barefoot. Allena Katz, MD  on 09/22/2012  3:37 PM     Previous Visit  Provider Department Encounter #  08/15/2012  7:45 AM Peter [place], MD [facility] [id number]

## 2012-11-01 ENCOUNTER — Other Ambulatory Visit: Payer: Self-pay | Admitting: Cardiology

## 2012-11-01 NOTE — Telephone Encounter (Signed)
Disp Refills Start End    amiodarone (PACERONE) 200 MG tablet 90 tablet 2 10/02/2012      Sig:  TAKE 1 TABLET (200 MG TOTAL) BY MOUTH DAILY.    Class:  Normal    DAW:  No    Comment:  Authorization is required for next refill.    Authorizing Provider:  Peter M [place], MD    Ordering User:  Joanette Gula, CMA

## 2012-11-07 ENCOUNTER — Other Ambulatory Visit: Payer: Self-pay | Admitting: Cardiology

## 2012-11-07 ENCOUNTER — Other Ambulatory Visit: Payer: Self-pay

## 2012-11-07 MED ORDER — CARVEDILOL 25 MG PO TABS: 12.5000 mg | ORAL_TABLET | Freq: Two times a day (BID) | ORAL | Status: DC

## 2012-11-07 NOTE — Telephone Encounter (Signed)
carvedilol (COREG) 25 MG tablet  TAKE ONE-HALF TABLET (12.5 MG TOTAL) BY MOUTH TWO TIMES DAILY WITH A MEAL.   90 tablet   1

## 2012-11-13 ENCOUNTER — Encounter: Payer: Self-pay | Admitting: Cardiology

## 2012-11-13 ENCOUNTER — Ambulatory Visit (INDEPENDENT_AMBULATORY_CARE_PROVIDER_SITE_OTHER): Payer: Medicare Other | Admitting: Cardiology

## 2012-11-13 ENCOUNTER — Ambulatory Visit (INDEPENDENT_AMBULATORY_CARE_PROVIDER_SITE_OTHER): Payer: Medicare Other | Admitting: *Deleted

## 2012-11-13 VITALS — BP 88/58 | HR 73 | Ht 75.0 in | Wt 238.1 lb

## 2012-11-13 DIAGNOSIS — I509 Heart failure, unspecified: Secondary | ICD-10-CM

## 2012-11-13 DIAGNOSIS — I10 Essential (primary) hypertension: Secondary | ICD-10-CM

## 2012-11-13 DIAGNOSIS — I4891 Unspecified atrial fibrillation: Secondary | ICD-10-CM

## 2012-11-13 DIAGNOSIS — Z7901 Long term (current) use of anticoagulants: Secondary | ICD-10-CM

## 2012-11-13 DIAGNOSIS — I2510 Atherosclerotic heart disease of native coronary artery without angina pectoris: Secondary | ICD-10-CM

## 2012-11-13 DIAGNOSIS — I5022 Chronic systolic (congestive) heart failure: Secondary | ICD-10-CM

## 2012-11-13 NOTE — Progress Notes (Signed)
Jason Choi Date of Birth: June 06, 1940 Medical Record #[id number]  History of Present Illness: Jason Choi is seen today for a follow up of his congestive heart failure.   He had his CABG back in 2002. He is on amiodarone for his PAF as well as his coumadin. Has pulmonary HTN as well. He has anemia and has been followed by hematology. It is felt that he has anemia of chronic disease. He was seen by EP back in March for consideration of an ICD. His repeat echo showed an EF of 35 to 40% and given the severe biventricular enlargement, recent MRI demonstrated an ejection fraction of 34% with marked left ventricular enlargement and global hypokinesis. He has deferred defibrillator placement mainly due to to job restrictions.  On followup today he reports he is doing well. Hot weather has affected his breathing more. He denies any increase in edema or weight. He does have some intermittent low blood pressure readings but generally his blood pressure does okay. He did have followup as his renal function in May and this was okay.   Current Outpatient Prescriptions on File Prior to Visit  Medication Sig Dispense Refill  . amiodarone (PACERONE) 200 MG tablet Take 1 tablet (200 mg total) by mouth daily.  90 tablet  3  . amiodarone (PACERONE) 200 MG tablet TAKE 1 TABLET (200 MG TOTAL) BY MOUTH DAILY.  90 tablet  2  . aspirin 81 MG tablet Take 81 mg by mouth daily.       Marland Kitchen atorvastatin (LIPITOR) 40 MG tablet Take 1 tablet (40 mg total) by mouth daily.  90 tablet  3  . atorvastatin (LIPITOR) 40 MG tablet TAKE 1 TABLET (40 MG TOTAL) BY MOUTH DAILY.  30 tablet  5  . carvedilol (COREG) 25 MG tablet Take 0.5 tablets (12.5 mg total) by mouth 2 (two) times daily with a meal.  90 tablet  3  . fish oil-omega-3 fatty acids 1000 MG capsule Take 1 g by mouth as needed.       . furosemide (LASIX) 80 MG tablet Take 80 mg am and 40 mg pm  60 tablet  6  . furosemide (LASIX) 80 MG tablet TAKE ONE TABLET (80 MG TOTAL) BY MOUTH  TWO TIMES DAILY.  180 tablet  0  . Multiple Vitamin (MULTIVITAMIN) tablet Take 1 tablet by mouth daily.        . potassium chloride SA (K-DUR,KLOR-CON) 20 MEQ tablet Take 20 meq twice daily.  60 tablet  6  . ramipril (ALTACE) 5 MG capsule Take 1 capsule (5 mg total) by mouth daily.  30 capsule  6  . warfarin (COUMADIN) 5 MG tablet Take 1 tablet (5 mg total) by mouth as directed.  90 tablet  1   No current facility-administered medications on file prior to visit.    Allergies  Allergen Reactions  . Procaine Hcl     Unknown reaction    Past Medical History  Diagnosis Date  . S/P CABG (coronary artery bypass graft) 2002    SVG to PDA, Free radial to Intermediate, LIMA to LAD by Dr Donata Clay  . HTN (hypertension)   . Hypercholesteremia   . LV dysfunction     EF 35 to 40% per echo May 2012; EF remains 35 to 40% per echo Feb 2013. Referred for ICD  . Chronic anticoagulation     on coumadin  . 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 High risk medication use  on amiodarone  . Atrial fibrillation     ON AMIODARONE: PER VISIT NOTE IN SINUS FIRST DEGREE HB  . Pulmonary hypertension, moderate to severe   . Cataract immature     BILATERAL  . Osteoarthritis of right shoulder region     ACUTE PAIN  . Rotator cuff tear 2012  . Anemia     Referral to GI Feb 2013  . Chronic kidney disease (CKD), stage II (mild)     Past Surgical History  Procedure Laterality Date  . Right leg surgeries      due to polio  . Left knee arthroscopy  2000  . Cardiac catheterization  2009    Grafts patent  . Cardiac catheterization  2012    Grafts patent. EF was 25 to 30% but 35 to 40 by echo  . Cardiac catheterization  2003  . Coronary artery bypass graft  2002    X3 VESSEL  . Cardioversion  2008--  FOR PAF    SUCCESSFUL  . Right shoulder rotator cuffe repair  05/16/11    History  Smoking status  . Former Smoker  . Types: Cigarettes  . Quit date: 05/12/1979  Smokeless tobacco  . Never Used     Comment: stopped smoking over 30 years ago.    History  Alcohol Use  . 8.4 oz/week  . 14 Cans of beer per week    Comment: couple beers and a shot of crown royal most days    Family History  Problem Relation Age of Onset  . Valvular heart disease Father   . Colon cancer Neg Hx   . Colon polyps Neg Hx   . Rectal cancer Neg Hx   . Stomach cancer Neg Hx     Review of Systems: The review of systems is per the HPI.  All other systems were reviewed and are negative.  Physical Exam: BP 88/58  Pulse 73  Ht 6\' 3"  (1.905 m)  Wt 238 lb 1.9 oz (108.011 kg)  BMI 29.76 kg/m2  SpO2 96% Patient is very pleasant and in no acute distress. He is obese. Skin is warm and dry. Color is normal.  HEENT is unremarkable. Normocephalic/atraumatic. PERRL. Sclera are nonicteric. Neck is supple. No masses. No significant JVD.. Lungs are clear. Cardiac exam shows an irregular rhythm. His rate is controlled. Abdomen is soft without definite ascites. Extremities reveal no edema on the left. He has chronic musculoskeletal deformity of the right lower extremity. Gait and ROM are intact. No gross neurologic deficits noted.   LABORATORY DATA:  INR is pending today.  Assessment / Plan: 1. Chronic congestive heart failure secondary to systolic dysfunction. Ejection fraction of 35%. Clinically he appears to be well compensated.  He has not tolerated Aldactone or higher doses of ACE inhibitor and Coreg due to hypotension and renal insufficiency. I recommended repeating an echocardiogram to compare with February 2013. If it is stable we will continue his current therapy. If he has a significant drop in his ejection fraction we may need to reconsider ICD placement.  2. Coronary disease status post CABG in 2002.  3. Atrial fibrillation, in sinus rhythm on amiodarone. On chronic Coumadin.  4. Chronic kidney disease stage 2-3.   5. Anemia of chronic disease.  6. Hypertension.  7. Hyponatremia related to  diuresis.

## 2012-11-13 NOTE — Addendum Note (Signed)
Addended by: Meda Klinefelter D on: 11/13/2012 09:44 AM   Modules accepted: Orders

## 2012-11-13 NOTE — Patient Instructions (Signed)
We will schedule you for an echocardiogram.  Continue your current therapy  I will see you in 6 months. 

## 2012-11-19 ENCOUNTER — Ambulatory Visit (HOSPITAL_COMMUNITY): Payer: Medicare Other | Attending: Cardiology | Admitting: Radiology

## 2012-11-19 DIAGNOSIS — I10 Essential (primary) hypertension: Secondary | ICD-10-CM

## 2012-11-19 DIAGNOSIS — I509 Heart failure, unspecified: Secondary | ICD-10-CM | POA: Insufficient documentation

## 2012-11-19 DIAGNOSIS — I5022 Chronic systolic (congestive) heart failure: Secondary | ICD-10-CM

## 2012-11-19 DIAGNOSIS — I4891 Unspecified atrial fibrillation: Secondary | ICD-10-CM

## 2012-11-19 DIAGNOSIS — Z7901 Long term (current) use of anticoagulants: Secondary | ICD-10-CM

## 2012-11-19 DIAGNOSIS — Z87891 Personal history of nicotine dependence: Secondary | ICD-10-CM | POA: Insufficient documentation

## 2012-11-19 DIAGNOSIS — I2510 Atherosclerotic heart disease of native coronary artery without angina pectoris: Secondary | ICD-10-CM | POA: Insufficient documentation

## 2012-11-19 NOTE — Progress Notes (Signed)
Echocardiogram performed.  

## 2012-12-20 ENCOUNTER — Telehealth: Payer: Self-pay | Admitting: *Deleted

## 2012-12-20 ENCOUNTER — Other Ambulatory Visit (HOSPITAL_BASED_OUTPATIENT_CLINIC_OR_DEPARTMENT_OTHER): Payer: Medicare Other | Admitting: Lab

## 2012-12-20 DIAGNOSIS — D649 Anemia, unspecified: Secondary | ICD-10-CM

## 2012-12-20 LAB — CBC WITH DIFFERENTIAL/PLATELET
BASO%: 0.8 % (ref 0.0–2.0)
Basophils Absolute: 0.1 10*3/uL (ref 0.0–0.1)
EOS%: 4.6 % (ref 0.0–7.0)
HCT: 35.1 % — ABNORMAL LOW (ref 38.4–49.9)
LYMPH%: 13.4 % — ABNORMAL LOW (ref 14.0–49.0)
MCH: 32.7 pg (ref 27.2–33.4)
MCHC: 34 g/dL (ref 32.0–36.0)
MCV: 96.2 fL (ref 79.3–98.0)
NEUT%: 71.9 % (ref 39.0–75.0)
Platelets: 176 10*3/uL (ref 140–400)

## 2012-12-20 NOTE — Telephone Encounter (Signed)
Informed pt of Jason Choi's message below.  Instructed to keep next appt in October as scheduled. He verbalized understanding.

## 2012-12-20 NOTE — Telephone Encounter (Signed)
Message copied by Wende Mott on Fri Dec 20, 2012 11:22 AM ------      Message from: Myrtis Ser      Created: Fri Dec 20, 2012 10:25 AM       Call pt. Hgb remains stable. No transfusion is indicated. Continue observation. ------

## 2012-12-26 ENCOUNTER — Ambulatory Visit (INDEPENDENT_AMBULATORY_CARE_PROVIDER_SITE_OTHER): Payer: Medicare Other | Admitting: *Deleted

## 2012-12-26 DIAGNOSIS — Z7901 Long term (current) use of anticoagulants: Secondary | ICD-10-CM

## 2012-12-26 DIAGNOSIS — I4891 Unspecified atrial fibrillation: Secondary | ICD-10-CM

## 2013-01-01 ENCOUNTER — Other Ambulatory Visit: Payer: Self-pay | Admitting: Cardiology

## 2013-01-23 ENCOUNTER — Other Ambulatory Visit: Payer: Self-pay | Admitting: Cardiology

## 2013-01-28 ENCOUNTER — Other Ambulatory Visit: Payer: Self-pay | Admitting: Cardiology

## 2013-01-30 ENCOUNTER — Ambulatory Visit (INDEPENDENT_AMBULATORY_CARE_PROVIDER_SITE_OTHER): Payer: Medicare Other | Admitting: *Deleted

## 2013-01-30 DIAGNOSIS — I4891 Unspecified atrial fibrillation: Secondary | ICD-10-CM

## 2013-01-30 DIAGNOSIS — Z7901 Long term (current) use of anticoagulants: Secondary | ICD-10-CM

## 2013-02-11 ENCOUNTER — Other Ambulatory Visit: Payer: Self-pay | Admitting: Cardiology

## 2013-02-12 ENCOUNTER — Other Ambulatory Visit: Payer: Self-pay

## 2013-02-12 MED ORDER — AMIODARONE HCL 200 MG PO TABS: 200.0000 mg | ORAL_TABLET | Freq: Every day | ORAL | Status: DC

## 2013-02-12 MED ORDER — RAMIPRIL 5 MG PO CAPS: 5.0000 mg | ORAL_CAPSULE | Freq: Every day | ORAL | Status: DC

## 2013-02-13 ENCOUNTER — Other Ambulatory Visit: Payer: Self-pay | Admitting: Cardiology

## 2013-02-14 ENCOUNTER — Ambulatory Visit (INDEPENDENT_AMBULATORY_CARE_PROVIDER_SITE_OTHER): Payer: Medicare Other | Admitting: Pharmacist

## 2013-02-14 DIAGNOSIS — I4891 Unspecified atrial fibrillation: Secondary | ICD-10-CM

## 2013-02-14 DIAGNOSIS — Z7901 Long term (current) use of anticoagulants: Secondary | ICD-10-CM

## 2013-03-07 ENCOUNTER — Ambulatory Visit (INDEPENDENT_AMBULATORY_CARE_PROVIDER_SITE_OTHER): Payer: Medicare Other | Admitting: *Deleted

## 2013-03-07 DIAGNOSIS — I4891 Unspecified atrial fibrillation: Secondary | ICD-10-CM

## 2013-03-07 DIAGNOSIS — Z7901 Long term (current) use of anticoagulants: Secondary | ICD-10-CM

## 2013-03-18 ENCOUNTER — Telehealth: Payer: Self-pay | Admitting: Hematology and Oncology

## 2013-03-18 NOTE — Telephone Encounter (Signed)
Moved 10/24 appt to 10/30 lb/NG. S/w pt he is aware.

## 2013-03-21 ENCOUNTER — Ambulatory Visit: Payer: Medicare Other

## 2013-03-21 ENCOUNTER — Other Ambulatory Visit: Payer: Medicare Other | Admitting: Lab

## 2013-03-27 ENCOUNTER — Ambulatory Visit (HOSPITAL_BASED_OUTPATIENT_CLINIC_OR_DEPARTMENT_OTHER): Payer: Medicare Other | Admitting: Hematology and Oncology

## 2013-03-27 ENCOUNTER — Encounter: Payer: Self-pay | Admitting: Hematology and Oncology

## 2013-03-27 ENCOUNTER — Other Ambulatory Visit (HOSPITAL_BASED_OUTPATIENT_CLINIC_OR_DEPARTMENT_OTHER): Payer: Medicare Other | Admitting: Lab

## 2013-03-27 VITALS — BP 119/74 | HR 64 | Temp 97.1°F | Resp 18 | Ht 75.0 in | Wt 243.6 lb

## 2013-03-27 DIAGNOSIS — D649 Anemia, unspecified: Secondary | ICD-10-CM

## 2013-03-27 DIAGNOSIS — I4891 Unspecified atrial fibrillation: Secondary | ICD-10-CM

## 2013-03-27 DIAGNOSIS — N189 Chronic kidney disease, unspecified: Secondary | ICD-10-CM

## 2013-03-27 DIAGNOSIS — D631 Anemia in chronic kidney disease: Secondary | ICD-10-CM

## 2013-03-27 LAB — CBC WITH DIFFERENTIAL/PLATELET
Basophils Absolute: 0.1 10*3/uL (ref 0.0–0.1)
Eosinophils Absolute: 0.2 10*3/uL (ref 0.0–0.5)
LYMPH%: 19.1 % (ref 14.0–49.0)
MCV: 98.7 fL — ABNORMAL HIGH (ref 79.3–98.0)
MONO%: 13.9 % (ref 0.0–14.0)
NEUT#: 3.2 10*3/uL (ref 1.5–6.5)
Platelets: 196 10*3/uL (ref 140–400)
RBC: 3.71 10*6/uL — ABNORMAL LOW (ref 4.20–5.82)

## 2013-03-27 LAB — CHCC SMEAR

## 2013-03-27 LAB — COMPREHENSIVE METABOLIC PANEL (CC13)
Alkaline Phosphatase: 117 U/L (ref 40–150)
BUN: 53.2 mg/dL — ABNORMAL HIGH (ref 7.0–26.0)
Creatinine: 2.4 mg/dL — ABNORMAL HIGH (ref 0.7–1.3)
Glucose: 108 mg/dl (ref 70–140)
Sodium: 144 mEq/L (ref 136–145)
Total Bilirubin: 0.67 mg/dL (ref 0.20–1.20)

## 2013-03-27 LAB — IRON AND TIBC CHCC
TIBC: 336 ug/dL (ref 202–409)
UIBC: 226 ug/dL (ref 117–376)

## 2013-03-27 LAB — FERRITIN CHCC: Ferritin: 182 ng/ml (ref 22–316)

## 2013-03-27 NOTE — Progress Notes (Signed)
Jason Choi OFFICE PROGRESS NOTE  Choi,Jason A, MD DIAGNOSIS:  Anemia of chronic disease  SUMMARY OF HEMATOLOGIC HISTORY: This is a patient who is being followed here because of chronic anemia. The patient has chronic kidney disease. INTERVAL HISTORY: Jason Choi 72 y.o. male returns for further followup related to his diagnosis of anemia. He denies any focal transfusion. He donated blood many years ago. The patient denies any recent signs or symptoms of bleeding such as spontaneous epistaxis, hematuria or hematochezia. He denies any symptoms of anemia such as headaches, chest pain on exertion, dizziness or muscle cramps. He eats a variety of diet including red meat. Denies any pica. His last EGD and colonoscopy was negative for signs of bleeding or cancer of the gastrointestinal tract  I have reviewed the past medical history, past surgical history, social history and family history with the patient and they are unchanged from previous note.  ALLERGIES:  is allergic to procaine hcl.  MEDICATIONS:  Current Outpatient Prescriptions  Medication Sig Dispense Refill  . amiodarone (PACERONE) 200 MG tablet Take 1 tablet (200 mg total) by mouth daily.  90 tablet  3  . aspirin 81 MG tablet Take 81 mg by mouth daily.       Marland Kitchen atorvastatin (LIPITOR) 40 MG tablet TAKE 1 TABLET (40 MG TOTAL) BY MOUTH DAILY.  30 tablet  5  . carvedilol (COREG) 25 MG tablet Take 0.5 tablets (12.5 mg total) by mouth 2 (two) times daily with a meal.  90 tablet  3  . fish oil-omega-3 fatty acids 1000 MG capsule Take 1 g by mouth as needed.       . furosemide (LASIX) 80 MG tablet Take 80 mg am and 40 mg pm  60 tablet  6  . furosemide (LASIX) 80 MG tablet TAKE ONE TABLET (80 MG TOTAL) BY MOUTH TWO TIMES DAILY.  180 tablet  1  . Multiple Vitamin (MULTIVITAMIN) tablet Take 1 tablet by mouth daily.        . potassium chloride SA (K-DUR,KLOR-CON) 20 MEQ tablet TAKE 1 TABLET BY MOUTH TWICE DAILY  60 tablet   5  . ramipril (ALTACE) 5 MG capsule Take 1 capsule (5 mg total) by mouth daily.  90 capsule  3  . warfarin (COUMADIN) 5 MG tablet TAKE 1 TABLET (5 MG TOTAL) BY MOUTH DAILY AS DIRECTED.  90 tablet  0   No current facility-administered medications for this visit.     REVIEW OF SYSTEMS:   Constitutional: Denies fevers, chills or night sweats Behavioral/Psych: Mood is stable, no new changes  All other systems were reviewed with the patient and are negative.  PHYSICAL EXAMINATION: ECOG PERFORMANCE STATUS: 0 - Asymptomatic  Filed Vitals:   03/27/13 1444  BP: 119/74  Pulse: 64  Temp: 97.1 F (36.2 C)  Resp: 18   Filed Weights   03/27/13 1444  Weight: 243 lb 9.6 oz (110.496 kg)    GENERAL:alert, no distress and comfortable. The patient is moderately obese SKIN: skin color, texture, turgor are normal, no rashes or significant lesions EYES: normal, Conjunctiva are pink and non-injected, sclera clear OROPHARYNX:no exudate, no erythema and lips, buccal mucosa, and tongue normal  NECK: supple, thyroid normal size, non-tender, without nodularity LYMPH:  no palpable lymphadenopathy in the cervical, axillary or inguinal LUNGS: clear to auscultation and percussion with normal breathing effort HEART: regular rate & rhythm and no murmurs and no lower extremity edema ABDOMEN:abdomen soft, non-tender and normal bowel sounds Musculoskeletal:no cyanosis  of digits and no clubbing . Noted bursitis of the right elbow joint NEURO: alert & oriented x 3 with fluent speech, no focal motor/sensory deficits  LABORATORY DATA:  I have reviewed the data as listed Results for orders placed in visit on 03/27/13 (from the past 48 hour(s))  CBC WITH DIFFERENTIAL     Status: Abnormal   Collection Time    03/27/13  1:41 PM      Result Value Range   WBC 5.2  4.0 - 10.3 10e3/uL   NEUT# 3.2  1.5 - 6.5 10e3/uL   HGB 11.9 (*) 13.0 - 17.1 g/dL   HCT 16.1 (*) 09.6 - 04.5 %   Platelets 196  140 - 400 10e3/uL    MCV 98.7 (*) 79.3 - 98.0 fL   MCH 32.1  27.2 - 33.4 pg   MCHC 32.5  32.0 - 36.0 g/dL   RBC 4.09 (*) 8.11 - 9.14 10e6/uL   RDW 14.6  11.0 - 14.6 %   lymph# 1.0  0.9 - 3.3 10e3/uL   MONO# 0.7  0.1 - 0.9 10e3/uL   Eosinophils Absolute 0.2  0.0 - 0.5 10e3/uL   Basophils Absolute 0.1  0.0 - 0.1 10e3/uL   NEUT% 61.2  39.0 - 75.0 %   LYMPH% 19.1  14.0 - 49.0 %   MONO% 13.9  0.0 - 14.0 %   EOS% 4.6  0.0 - 7.0 %   BASO% 1.2  0.0 - 2.0 %  COMPREHENSIVE METABOLIC PANEL ([facility]13)     Status: Abnormal   Collection Time    03/27/13  1:41 PM      Result Value Range   Sodium 144  136 - 145 mEq/L   Potassium 4.5  3.5 - 5.1 mEq/L   Chloride 107  98 - 109 mEq/L   CO2 24  22 - 29 mEq/L   Glucose 108  70 - 140 mg/dl   BUN 78.2 (*) 7.0 - 95.6 mg/dL   Creatinine 2.4 (*) 0.7 - 1.3 mg/dL   Total Bilirubin 2.13  0.20 - 1.20 mg/dL   Alkaline Phosphatase 117  40 - 150 U/L   AST 23  5 - 34 U/L   ALT 21  0 - 55 U/L   Total Protein 6.9  6.4 - 8.3 g/dL   Albumin 3.5  3.5 - 5.0 g/dL   Calcium 9.4  8.4 - 08.6 mg/dL   Anion Gap 13 (*) 3 - 11 mEq/L  [facility] SMEAR     Status: None   Collection Time    03/27/13  1:41 PM      Result Value Range   Smear Result Smear Available    FERRITIN [facility]     Status: None   Collection Time    03/27/13  1:41 PM      Result Value Range   Ferritin 182  22 - 316 ng/ml  IRON AND TIBC [facility]     Status: None   Collection Time    03/27/13  1:41 PM      Result Value Range   Iron 110  42 - 163 ug/dL   TIBC 578  469 - 629 ug/dL   UIBC 528  413 - 244 ug/dL   %SAT 33  20 - 55 %    ASSESSMENT:  Chronic anemia due to chronic kidney disease  PLAN:  #1 chronic anemia This is likely anemia of chronic disease. The patient denies recent history of bleeding such as epistaxis, hematuria or hematochezia. He  is asymptomatic from the anemia. We will observe for now.  He does not require transfusion now.  I recommended he discontinue oral iron supplements. If he is hemoglobin drop to less  than 10 g in the future, we can potentially prescribed erythropoietin stimulating agents to treat his anemia. #2 chronic kidney disease The patient was under the impression that his kidney function test is normal. I told the patient that his kidney function test is not normal. The patient will continue monitoring of kidney function test with his primary care provider. #3 atrial fibrillation on chronic anticoagulation therapy The patient had no bleeding complications from his anticoagulation therapy. He was for signs and symptoms of bleeding. I have not made a return appointment for the patient. Given option to follow him periodically on a yearly basis but the patient decided he would just call to reschedule future appointment if his hemoglobin drop to less than 10 g and he require further treatment for this. All questions were answered. The patient knows to call the clinic with any problems, questions or concerns. No barriers to learning was detected.  I spent 15 minutes counseling the patient face to face. The total time spent in the appointment was 20 minutes and more than 50% was on counseling.     [facility]oi, Chay Mazzoni, MD 03/27/2013 6:56 PM

## 2013-04-04 ENCOUNTER — Ambulatory Visit (INDEPENDENT_AMBULATORY_CARE_PROVIDER_SITE_OTHER): Payer: Medicare Other | Admitting: *Deleted

## 2013-04-04 DIAGNOSIS — Z7901 Long term (current) use of anticoagulants: Secondary | ICD-10-CM

## 2013-04-04 DIAGNOSIS — I4891 Unspecified atrial fibrillation: Secondary | ICD-10-CM

## 2013-04-04 LAB — POCT INR: INR: 3.7

## 2013-04-18 ENCOUNTER — Ambulatory Visit (INDEPENDENT_AMBULATORY_CARE_PROVIDER_SITE_OTHER): Payer: Medicare Other | Admitting: Pharmacist

## 2013-04-18 DIAGNOSIS — Z7901 Long term (current) use of anticoagulants: Secondary | ICD-10-CM

## 2013-04-18 DIAGNOSIS — I4891 Unspecified atrial fibrillation: Secondary | ICD-10-CM

## 2013-05-14 ENCOUNTER — Other Ambulatory Visit: Payer: Self-pay | Admitting: Cardiology

## 2013-05-20 ENCOUNTER — Ambulatory Visit (INDEPENDENT_AMBULATORY_CARE_PROVIDER_SITE_OTHER): Payer: Medicare Other | Admitting: *Deleted

## 2013-05-20 DIAGNOSIS — I4891 Unspecified atrial fibrillation: Secondary | ICD-10-CM

## 2013-05-20 DIAGNOSIS — Z7901 Long term (current) use of anticoagulants: Secondary | ICD-10-CM

## 2013-05-20 LAB — POCT INR: INR: 2

## 2013-06-10 ENCOUNTER — Ambulatory Visit (INDEPENDENT_AMBULATORY_CARE_PROVIDER_SITE_OTHER): Payer: Medicare Other | Admitting: *Deleted

## 2013-06-10 ENCOUNTER — Encounter: Payer: Self-pay | Admitting: Cardiology

## 2013-06-10 ENCOUNTER — Ambulatory Visit (INDEPENDENT_AMBULATORY_CARE_PROVIDER_SITE_OTHER): Payer: Medicare Other | Admitting: Cardiology

## 2013-06-10 VITALS — BP 104/64 | HR 64 | Ht 75.0 in | Wt 239.8 lb

## 2013-06-10 DIAGNOSIS — I5022 Chronic systolic (congestive) heart failure: Secondary | ICD-10-CM

## 2013-06-10 DIAGNOSIS — Z7901 Long term (current) use of anticoagulants: Secondary | ICD-10-CM

## 2013-06-10 DIAGNOSIS — I509 Heart failure, unspecified: Secondary | ICD-10-CM

## 2013-06-10 DIAGNOSIS — I4891 Unspecified atrial fibrillation: Secondary | ICD-10-CM

## 2013-06-10 DIAGNOSIS — I10 Essential (primary) hypertension: Secondary | ICD-10-CM

## 2013-06-10 DIAGNOSIS — I2510 Atherosclerotic heart disease of native coronary artery without angina pectoris: Secondary | ICD-10-CM

## 2013-06-10 LAB — BASIC METABOLIC PANEL
BUN: 44 mg/dL — AB (ref 6–23)
CHLORIDE: 110 meq/L (ref 96–112)
CO2: 27 meq/L (ref 19–32)
Calcium: 9.2 mg/dL (ref 8.4–10.5)
Creatinine, Ser: 1.9 mg/dL — ABNORMAL HIGH (ref 0.4–1.5)
GFR: 37.59 mL/min — ABNORMAL LOW (ref >60.00)
Glucose, Bld: 122 mg/dL — ABNORMAL HIGH (ref 70–99)
Potassium: 4.1 mEq/L (ref 3.5–5.1)
Sodium: 145 mEq/L (ref 135–145)

## 2013-06-10 LAB — CBC WITH DIFFERENTIAL/PLATELET
BASOS PCT: 0.7 % (ref 0.0–3.0)
Basophils Absolute: 0 10*3/uL (ref 0.0–0.1)
EOS PCT: 6.4 % — AB (ref 0.0–5.0)
Eosinophils Absolute: 0.4 10*3/uL (ref 0.0–0.7)
HEMATOCRIT: 34.9 % — AB (ref 39.0–52.0)
Hemoglobin: 11.7 g/dL — ABNORMAL LOW (ref 13.0–17.0)
LYMPHS ABS: 0.8 10*3/uL (ref 0.7–4.0)
Lymphocytes Relative: 12.6 % (ref 12.0–46.0)
MCHC: 33.6 g/dL (ref 30.0–36.0)
MCV: 95.8 fl (ref 78.0–100.0)
Monocytes Absolute: 0.8 10*3/uL (ref 0.1–1.0)
Monocytes Relative: 12.4 % — ABNORMAL HIGH (ref 3.0–12.0)
NEUTROS ABS: 4.3 10*3/uL (ref 1.4–7.7)
Neutrophils Relative %: 67.9 % (ref 43.0–77.0)
Platelets: 193 10*3/uL (ref 150.0–400.0)
RBC: 3.65 Mil/uL — AB (ref 4.22–5.81)
RDW: 15.6 % — ABNORMAL HIGH (ref 11.5–14.6)
WBC: 6.3 10*3/uL (ref 4.5–10.5)

## 2013-06-10 LAB — TSH: TSH: 1.37 u[IU]/mL (ref 0.35–5.50)

## 2013-06-10 LAB — T4, FREE: Free T4: 1.49 ng/dL (ref 0.60–1.60)

## 2013-06-10 LAB — POCT INR: INR: 2.1

## 2013-06-10 NOTE — Patient Instructions (Signed)
Continue your current therapy  We will schedule you for an elective cardioversion  We will check blood work today.

## 2013-06-10 NOTE — Progress Notes (Signed)
 Jason Choi Date of Birth: 01/31/1941 Medical Record #[id number]  History of Present Illness: Jason Choi is seen today for a follow up of his congestive heart failure.   He had his CABG back in 2002. He has a history of atrial fibrillation and is on chronic amiodarone and coumadin. His last cardioversion was in May 2008. Has pulmonary HTN. He has anemia and has been followed by hematology. It is felt that he has anemia of chronic disease.  His last echo in June 2014 showed an EF of 35 to 40% and given the severe biventricular enlargement, recent MRI demonstrated an ejection fraction of 34% with marked left ventricular enlargement and global hypokinesis. He has deferred defibrillator placement mainly due to to job restrictions.  On followup today he reports he fell in his driveway over the holidays and suffered an abrasion on his elbow and bruising of his hip. Over this past weekend he complained of feeling lightheaded with standing. No increase in SOB. No chest pain. No increase edema and weight has been stable. No complaints of palpitations.   Current Outpatient Prescriptions on File Prior to Visit  Medication Sig Dispense Refill  . amiodarone (PACERONE) 200 MG tablet Take 1 tablet (200 mg total) by mouth daily.  90 tablet  3  . aspirin 81 MG tablet Take 81 mg by mouth daily.       . atorvastatin (LIPITOR) 40 MG tablet TAKE 1 TABLET (40 MG TOTAL) BY MOUTH DAILY.  30 tablet  5  . carvedilol (COREG) 25 MG tablet Take 0.5 tablets (12.5 mg total) by mouth 2 (two) times daily with a meal.  90 tablet  3  . fish oil-omega-3 fatty acids 1000 MG capsule Take 1 g by mouth as needed.       . furosemide (LASIX) 80 MG tablet Take 80 mg am and 40 mg pm  60 tablet  6  . furosemide (LASIX) 80 MG tablet TAKE ONE TABLET (80 MG TOTAL) BY MOUTH TWO TIMES DAILY.  180 tablet  1  . Multiple Vitamin (MULTIVITAMIN) tablet Take 1 tablet by mouth daily.        . potassium chloride SA (K-DUR,KLOR-CON) 20 MEQ tablet TAKE 1  TABLET BY MOUTH TWICE DAILY  60 tablet  5  . ramipril (ALTACE) 5 MG capsule Take 1 capsule (5 mg total) by mouth daily.  90 capsule  3  . warfarin (COUMADIN) 5 MG tablet TAKE 1 TABLET (5 MG TOTAL) BY MOUTH DAILY AS DIRECTED.  90 tablet  1   No current facility-administered medications on file prior to visit.    Allergies  Allergen Reactions  . Procaine Hcl     Unknown reaction    Past Medical History  Diagnosis Date  . S/P CABG (coronary artery bypass graft) 2002    SVG to PDA, Free radial to Intermediate, LIMA to LAD by Dr Van Trigt  . HTN (hypertension)   . Hypercholesteremia   . LV dysfunction     EF 35 to 40% per echo May 2012; EF remains 35 to 40% per echo Feb 2013. Referred for ICD  . Chronic anticoagulation     on coumadin  . 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 High risk medication use     on amiodarone  . Atrial fibrillation     ON AMIODARONE: PER VISIT NOTE IN [facility]  . Pulmonary hypertension, moderate to severe   . Cataract immature       BILATERAL  . Osteoarthritis of right shoulder region     ACUTE PAIN  . Rotator cuff tear 2012  . Anemia     Referral to GI Feb 2013  . Chronic kidney disease (CKD), stage II (mild)     Past Surgical History  Procedure Laterality Date  . Right leg surgeries      due to polio  . Left knee arthroscopy  2000  . Cardiac catheterization  2009    Grafts patent  . Cardiac catheterization  2012    Grafts patent. EF was 25 to 30% but 35 to 40 by echo  . Cardiac catheterization  2003  . Coronary artery bypass graft  2002    X3 VESSEL  . Cardioversion  2008--  FOR PAF    SUCCESSFUL  . Right shoulder rotator cuffe repair  05/16/11    History  Smoking status  . Former Smoker  . Types: Cigarettes  . Quit date: 05/12/1979  Smokeless tobacco  . Never Used    Comment: stopped smoking over 30 years ago.    History  Alcohol Use  . 8.4 oz/week  . 14 Cans of beer per week    Comment: couple beers and a shot  of crown royal most days    Family History  Problem Relation Age of Onset  . Valvular heart disease Father   . Colon cancer Neg Hx   . Colon polyps Neg Hx   . Rectal cancer Neg Hx   . Stomach cancer Neg Hx     Review of Systems: The review of systems is per the HPI.  All other systems were reviewed and are negative.  Physical Exam: BP 104/64  Pulse 64  Ht 6\' 3"  (1.905 m)  Wt 239 lb 12.8 oz (108.773 kg)  BMI 29.97 kg/m2 Patient is very pleasant and in no acute distress. He is obese. Skin is warm and dry. Color is normal.  HEENT is unremarkable. Normocephalic/atraumatic. PERRL. Sclera are nonicteric. Neck is supple. No masses. No significant JVD.. Lungs are clear. Cardiac exam shows an irregular rhythm. His rate is controlled. Abdomen is soft without definite ascites. Extremities reveal no edema on the left. He has chronic musculoskeletal deformity of the right lower extremity. Gait and ROM are intact. No gross neurologic deficits noted.   LABORATORY DATA:  INR/Prothrombin Time: 2.1  Ecg shows atrial fibrillation with rate 64 bpm. Nonspecific IVCD, nonspecific ST-T changes.   Assessment / Plan: 1. Chronic congestive heart failure secondary to systolic dysfunction. Ejection fraction of 35%. Clinically he appears to be well compensated.  He has not tolerated Aldactone or higher doses of ACE inhibitor and Coreg due to hypotension and renal insufficiency.   2. Coronary disease status post CABG in 2002.  3. Atrial fibrillation- recurrent. Last documented Afib was in 2008. Rate is controlled and he has been therapeutic on coumadin. This may be the cause of his orthostatic dizzyness recently. I have recommended proceeding with DCCV. We will schedule this for Friday. Will check labs today including thyroid studies.  4. Chronic kidney disease stage 2-3.   5. Anemia of chronic disease.  6. Hypertension.  7. Hyponatremia related to diuresis.

## 2013-06-10 NOTE — Addendum Note (Signed)
Addended by: Eulis Foster on: 06/10/2013 08:28 AM   Modules accepted: Orders

## 2013-06-12 MED ORDER — SODIUM CHLORIDE 0.9 % IV SOLN
INTRAVENOUS | Status: DC
  Administered 2013-06-13: 10:00:00 via INTRAVENOUS

## 2013-06-13 ENCOUNTER — Encounter (HOSPITAL_COMMUNITY): Payer: Medicare Other | Admitting: Anesthesiology

## 2013-06-13 ENCOUNTER — Encounter (HOSPITAL_COMMUNITY): Payer: Self-pay | Admitting: Anesthesiology

## 2013-06-13 ENCOUNTER — Ambulatory Visit (HOSPITAL_COMMUNITY)
Admission: RE | Admit: 2013-06-13 | Discharge: 2013-06-13 | Disposition: A | Discharge status: Home / Self Care | Payer: Medicare Other | Source: Ambulatory Visit | Attending: Cardiology | Admitting: Cardiology

## 2013-06-13 ENCOUNTER — Ambulatory Visit (HOSPITAL_COMMUNITY): Payer: Medicare Other | Admitting: Anesthesiology

## 2013-06-13 ENCOUNTER — Encounter (HOSPITAL_COMMUNITY)
Admission: RE | Disposition: A | Discharge status: Home / Self Care | Payer: Self-pay | Source: Ambulatory Visit | Attending: Cardiology

## 2013-06-13 DIAGNOSIS — D649 Anemia, unspecified: Secondary | ICD-10-CM | POA: Insufficient documentation

## 2013-06-13 DIAGNOSIS — Z7982 Long term (current) use of aspirin: Secondary | ICD-10-CM | POA: Diagnosis not present

## 2013-06-13 DIAGNOSIS — I4891 Unspecified atrial fibrillation: Secondary | ICD-10-CM

## 2013-06-13 DIAGNOSIS — I129 Hypertensive chronic kidney disease with stage 1 through stage 4 chronic kidney disease, or unspecified chronic kidney disease: Secondary | ICD-10-CM | POA: Diagnosis not present

## 2013-06-13 DIAGNOSIS — N182 Chronic kidney disease, stage 2 (mild): Secondary | ICD-10-CM | POA: Insufficient documentation

## 2013-06-13 DIAGNOSIS — Z9181 History of falling: Secondary | ICD-10-CM | POA: Insufficient documentation

## 2013-06-13 DIAGNOSIS — I2510 Atherosclerotic heart disease of native coronary artery without angina pectoris: Secondary | ICD-10-CM | POA: Insufficient documentation

## 2013-06-13 DIAGNOSIS — I252 Old myocardial infarction: Secondary | ICD-10-CM | POA: Insufficient documentation

## 2013-06-13 DIAGNOSIS — D638 Anemia in other chronic diseases classified elsewhere: Secondary | ICD-10-CM | POA: Insufficient documentation

## 2013-06-13 DIAGNOSIS — Z87891 Personal history of nicotine dependence: Secondary | ICD-10-CM | POA: Insufficient documentation

## 2013-06-13 DIAGNOSIS — Z7901 Long term (current) use of anticoagulants: Secondary | ICD-10-CM | POA: Insufficient documentation

## 2013-06-13 DIAGNOSIS — E871 Hypo-osmolality and hyponatremia: Secondary | ICD-10-CM | POA: Insufficient documentation

## 2013-06-13 DIAGNOSIS — Z8612 Personal history of poliomyelitis: Secondary | ICD-10-CM | POA: Diagnosis not present

## 2013-06-13 DIAGNOSIS — Z951 Presence of aortocoronary bypass graft: Secondary | ICD-10-CM | POA: Insufficient documentation

## 2013-06-13 DIAGNOSIS — I509 Heart failure, unspecified: Secondary | ICD-10-CM | POA: Insufficient documentation

## 2013-06-13 DIAGNOSIS — E7800 Pure hypercholesterolemia, unspecified: Secondary | ICD-10-CM | POA: Insufficient documentation

## 2013-06-13 DIAGNOSIS — I2789 Other specified pulmonary heart diseases: Secondary | ICD-10-CM | POA: Diagnosis not present

## 2013-06-13 DIAGNOSIS — I5022 Chronic systolic (congestive) heart failure: Secondary | ICD-10-CM | POA: Diagnosis not present

## 2013-06-13 DIAGNOSIS — M19019 Primary osteoarthritis, unspecified shoulder: Secondary | ICD-10-CM | POA: Insufficient documentation

## 2013-06-13 HISTORY — PX: CARDIOVERSION: SHX1299

## 2013-06-13 HISTORY — DX: Acute myocardial infarction, unspecified: I21.9

## 2013-06-13 LAB — PROTIME-INR
INR: 2.24 — ABNORMAL HIGH (ref 0.00–1.49)
PROTHROMBIN TIME: 24.1 s — AB (ref 11.6–15.2)

## 2013-06-13 SURGERY — CARDIOVERSION
Anesthesia: Monitor Anesthesia Care

## 2013-06-13 MED ORDER — SODIUM CHLORIDE 0.9 % IJ SOLN: 3.0000 mL | INTRAMUSCULAR | Status: DC | PRN

## 2013-06-13 MED ORDER — SODIUM CHLORIDE 0.9 % IJ SOLN: 3.0000 mL | Freq: Two times a day (BID) | INTRAMUSCULAR | Status: DC

## 2013-06-13 MED ORDER — SODIUM CHLORIDE 0.9 % IV SOLN: 250.0000 mL | INTRAVENOUS | Status: DC

## 2013-06-13 MED ORDER — PROPOFOL 10 MG/ML IV BOLUS
INTRAVENOUS | Status: DC | PRN
  Administered 2013-06-13: 140 mg via INTRAVENOUS

## 2013-06-13 MED ORDER — PHENYLEPHRINE HCL 10 MG/ML IJ SOLN
INTRAMUSCULAR | Status: DC | PRN
  Administered 2013-06-13: 40 ug via INTRAVENOUS

## 2013-06-13 MED ORDER — LIDOCAINE HCL (CARDIAC) 20 MG/ML IV SOLN
INTRAVENOUS | Status: DC | PRN
  Administered 2013-06-13: 40 mg via INTRAVENOUS

## 2013-06-13 NOTE — Discharge Instructions (Signed)
Electrical Cardioversion Electrical cardioversion is the delivery of a jolt of electricity to change the rhythm of the heart. Sticky patches or metal paddles are placed on the chest to deliver the electricity from a device. This is done to restore a normal rhythm. A rhythm that is too fast or not regular keeps the heart from pumping well. Electrical cardioversion is done in an emergency if:   There is low or no blood pressure as a result of the heart rhythm.   Normal rhythm must be restored as fast as possible to protect the brain and heart from further damage.   It may save a life. Cardioversion may be done for heart rhythms that are not immediately life-threatening, such as atrial fibrillation or flutter, in which:   The heart is beating too fast or is not regular.   Medicine to change the rhythm has not worked.   It is safe to wait in order to allow time for preparation.  Symptoms of the abnormal rhythm are bothersome.  The risk of stroke and other serious complications can be reduced. LET YOUR CAREGIVER KNOW ABOUT:   All medicines you are taking, including vitamins, herbs, eye drops, creams, and over-the-counter medicines.   Previous problems you or members of your family have had with the use of anesthetics.   Any blood disorders you have.   Previous surgeries you have had.   Medical conditions you have. RISKS AND COMPLICATIONS  Generally, this is a safe procedure. However, as with any procedure, complications can occur. Possible complications include:   Breathing problems related to the anesthetic used.  Cardiac arrest This risk is rare.  A blood clot that breaks free and travels to other parts of your body. This could cause a stroke or other problems. The risk of this is lowered by use of blood thinning medicine (anticoagulant) prior to the procedure. BEFORE THE PROCEDURE    You may have tests to detect blood clots in your heart and evaluate heart  function.  You may start taking anticoagulants so your blood does not clot as easily.   Medicines may be given to help stabilize your heart rate and rhythm. PROCEDURE  You will be given medicine through an IV tube to reduce discomfort and make you sleepy (sedative).   An electrical shock will be delivered. AFTER THE PROCEDURE Your heart rhythm will be watched to make sure it does not change.You may be able to go home within a few hours.  Document Released: 05/05/2002 Document Revised: 03/05/2013 Document Reviewed: 11/27/2012 [facility] Patient Information 2014 [place]. Moderate Sedation, Adult Moderate sedation is given to help you relax or even sleep through a procedure. You may remain sleepy, be clumsy, or have poor balance for several hours following this procedure. Arrange for a responsible adult, family member, or friend to take you home. A responsible adult should stay with you for at least 24 hours or until the medicines have worn off.  Do not participate in any activities where you could become injured for the next 24 hours, or until you feel normal again. Do not:  Drive.  Swim.  Ride a bicycle.  Operate heavy machinery.  Cook.  Use power tools.  Climb ladders.  Work at [employer].  Do not make important decisions or sign legal documents until you are improved.  Vomiting may occur if you eat too soon. When you can drink without vomiting, try water, juice, or soup. Try solid foods if you feel little or no nausea.  Only take over-the-counter or prescription medications for pain, discomfort, or fever as directed by your caregiver.If pain medications have been prescribed for you, ask your caregiver how soon it is safe to take them.  Make sure you and your family fully understands everything about the medication given to you. Make sure you understand what side effects may occur.  You should not drink alcohol, take sleeping pills, or medications that cause  drowsiness for at least 24 hours.  If you smoke, do not smoke alone.  If you are feeling better, you may resume normal activities 24 hours after receiving sedation.  Keep all appointments as scheduled. Follow all instructions.  Ask questions if you do not understand. SEEK MEDICAL CARE IF:   Your skin is pale or bluish in color.  You continue to feel sick to your stomach (nauseous) or throw up (vomit).  Your pain is getting worse and not helped by medication.  You have bleeding or swelling.  You are still sleepy or feeling clumsy after 24 hours. SEEK IMMEDIATE MEDICAL CARE IF:   You develop a rash.  You have difficulty breathing.  You develop any type of allergic problem.  You have a fever. Document Released: 02/07/2001 Document Revised: 08/07/2011 Document Reviewed: 01/20/2013 [facility] Patient Information 2014 [place].

## 2013-06-13 NOTE — H&P (View-Only) (Signed)
Jason Choi Date of Birth: December 08, 1940 Medical Record #[id number]  History of Present Illness: Jason Choi is seen today for a follow up of his congestive heart failure.   He had his CABG back in 2002. He has a history of atrial fibrillation and is on chronic amiodarone and coumadin. His last cardioversion was in May 2008. Has pulmonary HTN. He has anemia and has been followed by hematology. It is felt that he has anemia of chronic disease.  His last echo in June 2014 showed an EF of 35 to 40% and given the severe biventricular enlargement, recent MRI demonstrated an ejection fraction of 34% with marked left ventricular enlargement and global hypokinesis. He has deferred defibrillator placement mainly due to to job restrictions.  On followup today he reports he fell in his driveway over the holidays and suffered an abrasion on his elbow and bruising of his hip. Over this past weekend he complained of feeling lightheaded with standing. No increase in SOB. No chest pain. No increase edema and weight has been stable. No complaints of palpitations.   Current Outpatient Prescriptions on File Prior to Visit  Medication Sig Dispense Refill  . amiodarone (PACERONE) 200 MG tablet Take 1 tablet (200 mg total) by mouth daily.  90 tablet  3  . aspirin 81 MG tablet Take 81 mg by mouth daily.       Jason Choi Kitchen atorvastatin (LIPITOR) 40 MG tablet TAKE 1 TABLET (40 MG TOTAL) BY MOUTH DAILY.  30 tablet  5  . carvedilol (COREG) 25 MG tablet Take 0.5 tablets (12.5 mg total) by mouth 2 (two) times daily with a meal.  90 tablet  3  . fish oil-omega-3 fatty acids 1000 MG capsule Take 1 g by mouth as needed.       . furosemide (LASIX) 80 MG tablet Take 80 mg am and 40 mg pm  60 tablet  6  . furosemide (LASIX) 80 MG tablet TAKE ONE TABLET (80 MG TOTAL) BY MOUTH TWO TIMES DAILY.  180 tablet  1  . Multiple Vitamin (MULTIVITAMIN) tablet Take 1 tablet by mouth daily.        . potassium chloride SA (K-DUR,KLOR-CON) 20 MEQ tablet TAKE 1  TABLET BY MOUTH TWICE DAILY  60 tablet  5  . ramipril (ALTACE) 5 MG capsule Take 1 capsule (5 mg total) by mouth daily.  90 capsule  3  . warfarin (COUMADIN) 5 MG tablet TAKE 1 TABLET (5 MG TOTAL) BY MOUTH DAILY AS DIRECTED.  90 tablet  1   No current facility-administered medications on file prior to visit.    Allergies  Allergen Reactions  . Procaine Hcl     Unknown reaction    Past Medical History  Diagnosis Date  . S/P CABG (coronary artery bypass graft) 2002    SVG to PDA, Free radial to Intermediate, LIMA to LAD by Dr Prescott Gum  . HTN (hypertension)   . Hypercholesteremia   . LV dysfunction     EF 35 to 40% per echo May 2012; EF remains 35 to 40% per echo Feb 2013. Referred for ICD  . Chronic anticoagulation     on coumadin  . 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 High risk medication use     on amiodarone  . Atrial fibrillation     ON AMIODARONE: PER VISIT NOTE IN [facility] FIRST DEGREE HB  . Pulmonary hypertension, moderate to severe   . Cataract immature  BILATERAL  . Osteoarthritis of right shoulder region     ACUTE PAIN  . Rotator cuff tear 2012  . Anemia     Referral to GI Feb 2013  . Chronic kidney disease (CKD), stage II (mild)     Past Surgical History  Procedure Laterality Date  . Right leg surgeries      due to polio  . Left knee arthroscopy  2000  . Cardiac catheterization  2009    Grafts patent  . Cardiac catheterization  2012    Grafts patent. EF was 25 to 30% but 35 to 40 by echo  . Cardiac catheterization  2003  . Coronary artery bypass graft  2002    X3 VESSEL  . Cardioversion  2008--  FOR PAF    SUCCESSFUL  . Right shoulder rotator cuffe repair  05/16/11    History  Smoking status  . Former Smoker  . Types: Cigarettes  . Quit date: 05/12/1979  Smokeless tobacco  . Never Used    Comment: stopped smoking over 30 years ago.    History  Alcohol Use  . 8.4 oz/week  . 14 Cans of beer per week    Comment: couple beers and a shot  of crown royal most days    Family History  Problem Relation Age of Onset  . Valvular heart disease Father   . Colon cancer Neg Hx   . Colon polyps Neg Hx   . Rectal cancer Neg Hx   . Stomach cancer Neg Hx     Review of Systems: The review of systems is per the HPI.  All other systems were reviewed and are negative.  Physical Exam: BP 104/64  Pulse 64  Ht 6\' 3"  (1.905 m)  Wt 239 lb 12.8 oz (108.773 kg)  BMI 29.97 kg/m2 Patient is very pleasant and in no acute distress. He is obese. Skin is warm and dry. Color is normal.  HEENT is unremarkable. Normocephalic/atraumatic. PERRL. Sclera are nonicteric. Neck is supple. No masses. No significant JVD.. Lungs are clear. Cardiac exam shows an irregular rhythm. His rate is controlled. Abdomen is soft without definite ascites. Extremities reveal no edema on the left. He has chronic musculoskeletal deformity of the right lower extremity. Gait and ROM are intact. No gross neurologic deficits noted.   LABORATORY DATA:  INR/Prothrombin Time: 2.1  Ecg shows atrial fibrillation with rate 64 bpm. Nonspecific IVCD, nonspecific ST-T changes.   Assessment / Plan: 1. Chronic congestive heart failure secondary to systolic dysfunction. Ejection fraction of 35%. Clinically he appears to be well compensated.  He has not tolerated Aldactone or higher doses of ACE inhibitor and Coreg due to hypotension and renal insufficiency.   2. Coronary disease status post CABG in 2002.  3. Atrial fibrillation- recurrent. Last documented Afib was in 2008. Rate is controlled and he has been therapeutic on coumadin. This may be the cause of his orthostatic dizzyness recently. I have recommended proceeding with DCCV. We will schedule this for Friday. Will check labs today including thyroid studies.  4. Chronic kidney disease stage 2-3.   5. Anemia of chronic disease.  6. Hypertension.  7. Hyponatremia related to diuresis.

## 2013-06-13 NOTE — Anesthesia Preprocedure Evaluation (Addendum)
Anesthesia Evaluation  Patient identified by MRN, date of birth, ID band Patient awake    Reviewed: Allergy & Precautions, H&P , NPO status , Patient's Chart, lab work & pertinent test results, reviewed documented beta blocker date and time   Airway Mallampati: I TM Distance: >3 FB Neck ROM: Full    Dental  (+) Upper Dentures, Teeth Intact and Dental Advisory Given   Pulmonary former smoker,  breath sounds clear to auscultation        Cardiovascular hypertension, Pt. on home beta blockers + CAD, + Past MI, + CABG and +CHF + dysrhythmias Rhythm:Irregular Rate:Normal  CABG  2002   Neuro/Psych    GI/Hepatic   Endo/Other    Renal/GU      Musculoskeletal   Abdominal   Peds  Hematology   Anesthesia Other Findings   Reproductive/Obstetrics                          Anesthesia Physical Anesthesia Plan  ASA: III  Anesthesia Plan:    Post-op Pain Management:    Induction: Intravenous  Airway Management Planned: Mask  Additional Equipment:   Intra-op Plan:   Post-operative Plan:   Informed Consent: I have reviewed the patients History and Physical, chart, labs and discussed the procedure including the risks, benefits and alternatives for the proposed anesthesia with the patient or authorized representative who has indicated his/her understanding and acceptance.   Dental advisory given  Plan Discussed with: CRNA and Surgeon  Anesthesia Plan Comments:         Anesthesia Quick Evaluation

## 2013-06-13 NOTE — Interval H&P Note (Signed)
History and Physical Interval Note:  06/13/2013 11:10 AM  Jason Choi  has presented today for surgery, with the diagnosis of AFIB  The various methods of treatment have been discussed with the patient and family. After consideration of risks, benefits and other options for treatment, the patient has consented to  Procedure(s): CARDIOVERSION (N/A) as a surgical intervention .  The patient's history has been reviewed, patient examined, no change in status, stable for surgery.  I have reviewed the patient's chart and labs.  Questions were answered to the patient's satisfaction.     Kyley Solow [place] MD,FACC 06/13/2013 11:10 AM

## 2013-06-13 NOTE — Anesthesia Postprocedure Evaluation (Signed)
  Anesthesia Post-op Note  Patient: Jason Choi  Procedure(s) Performed: Procedure(s): CARDIOVERSION (N/A)  Patient Location: PACU and Endoscopy Unit  Anesthesia Type:General  Level of Consciousness: awake, alert , oriented and patient cooperative  Airway and Oxygen Therapy: Patient Spontanous Breathing and room air  Post-op Pain: none  Post-op Assessment: Post-op Vital signs reviewed, Patient's Cardiovascular Status Stable, Respiratory Function Stable, Patent Airway and No signs of Nausea or vomiting  Post-op Vital Signs: Reviewed and stable  Complications: No apparent anesthesia complications

## 2013-06-13 NOTE — Transfer of Care (Signed)
Immediate Anesthesia Transfer of Care Note  Patient: Jason Choi  Procedure(s) Performed: Procedure(s): CARDIOVERSION (N/A)  Patient Location: PACU and Endoscopy Unit  Anesthesia Type:General  Level of Consciousness: awake, alert , oriented and patient cooperative  Airway & Oxygen Therapy: Patient Spontanous Breathing and room air  Post-op Assessment: Report given to PACU RN, Post -op Vital signs reviewed and stable and Patient moving all extremities  Post vital signs: Reviewed and stable  Complications: No apparent anesthesia complications

## 2013-06-13 NOTE — CV Procedure (Signed)
    Electrical Cardioversion Procedure Note Jason Choi [id number] 10/28/1940  Procedure: Electrical Cardioversion Indications:  Atrial Fibrillation  Procedure Details Consent: Risks of procedure as well as the alternatives and risks of each were explained to the (patient/caregiver).  Consent for procedure obtained. Time Out: Verified patient identification, verified procedure, site/side was marked, verified correct patient position, special equipment/implants available, medications/allergies/relevent history reviewed, required imaging and test results available.  Performed  Patient placed on cardiac monitor, pulse oximetry, supplemental oxygen as necessary.  Sedation given: Short-acting barbiturates Pacer pads placed anterior and posterior chest.  Cardioverted 1 time(s).  Cardioverted at 150J.  Evaluation Findings: Post procedure EKG shows: NSR Complications: None Patient did tolerate procedure well.   Jason Choi [facility] 06/13/2013, 11:17 AM

## 2013-06-13 NOTE — Preoperative (Signed)
Beta Blockers   Reason not to administer Beta Blockers:coreg at home

## 2013-06-16 ENCOUNTER — Encounter (HOSPITAL_COMMUNITY): Payer: Self-pay | Admitting: Cardiology

## 2013-06-24 ENCOUNTER — Other Ambulatory Visit: Payer: Self-pay

## 2013-06-24 MED ORDER — POTASSIUM CHLORIDE CRYS ER 20 MEQ PO TBCR: EXTENDED_RELEASE_TABLET | ORAL | Status: DC

## 2013-06-26 ENCOUNTER — Ambulatory Visit (INDEPENDENT_AMBULATORY_CARE_PROVIDER_SITE_OTHER): Payer: Medicare Other | Admitting: Cardiology

## 2013-06-26 ENCOUNTER — Encounter: Payer: Self-pay | Admitting: Cardiology

## 2013-06-26 VITALS — BP 112/58 | HR 57 | Ht 75.0 in | Wt 242.1 lb

## 2013-06-26 DIAGNOSIS — E7800 Pure hypercholesterolemia, unspecified: Secondary | ICD-10-CM

## 2013-06-26 DIAGNOSIS — I5022 Chronic systolic (congestive) heart failure: Secondary | ICD-10-CM

## 2013-06-26 DIAGNOSIS — I4891 Unspecified atrial fibrillation: Secondary | ICD-10-CM

## 2013-06-26 DIAGNOSIS — I2510 Atherosclerotic heart disease of native coronary artery without angina pectoris: Secondary | ICD-10-CM

## 2013-06-26 DIAGNOSIS — I10 Essential (primary) hypertension: Secondary | ICD-10-CM

## 2013-06-26 DIAGNOSIS — Z7901 Long term (current) use of anticoagulants: Secondary | ICD-10-CM

## 2013-06-26 DIAGNOSIS — I509 Heart failure, unspecified: Secondary | ICD-10-CM

## 2013-06-26 NOTE — Patient Instructions (Signed)
Continue your current therapy  I will see you in 6 months.   

## 2013-06-26 NOTE — Progress Notes (Signed)
[facility] Date of Birth: 1940-09-30 Medical Record #[id number]  History of Present Illness: Jason Choi is seen today for a follow up of his congestive heart failure.   He had his CABG back in 2002. He has a history of atrial fibrillation and is on chronic amiodarone and coumadin. His last cardioversion was in May 2008. Has pulmonary HTN. He has anemia and has been followed by hematology. It is felt that he has anemia of chronic disease.  His last echo in June 2014 showed an EF of 35 to 40% and given the severe biventricular enlargement, recent MRI demonstrated an ejection fraction of 34% with marked left ventricular enlargement and global hypokinesis. He has deferred defibrillator placement mainly due to to job restrictions.  On his last visit he was found to be in atrial fibrillation. He underwent successful DCCV on 06/13/13. He notes no change. He denies any SOB, dizziness, or chest pain.   Current Outpatient Prescriptions on File Prior to Visit  Medication Sig Dispense Refill  . amiodarone (PACERONE) 200 MG tablet Take 1 tablet (200 mg total) by mouth daily.  90 tablet  3  . aspirin 81 MG tablet Take 81 mg by mouth daily.       Marland Kitchen atorvastatin (LIPITOR) 40 MG tablet TAKE 1 TABLET (40 MG TOTAL) BY MOUTH DAILY.  30 tablet  5  . carvedilol (COREG) 25 MG tablet Take 0.5 tablets (12.5 mg total) by mouth 2 (two) times daily with a meal.  90 tablet  3  . fish oil-omega-3 fatty acids 1000 MG capsule Take 1 g by mouth as needed.       . furosemide (LASIX) 80 MG tablet TAKE ONE TABLET (80 MG TOTAL) BY MOUTH TWO TIMES DAILY.  180 tablet  1  . Multiple Vitamin (MULTIVITAMIN) tablet Take 1 tablet by mouth daily.        . potassium chloride SA (K-DUR,KLOR-CON) 20 MEQ tablet TAKE 1 TABLET BY MOUTH TWICE DAILY  60 tablet  5  . ramipril (ALTACE) 5 MG capsule Take 1 capsule (5 mg total) by mouth daily.  90 capsule  3  . warfarin (COUMADIN) 5 MG tablet TAKE 1 TABLET (5 MG TOTAL) BY MOUTH DAILY AS DIRECTED.  90  tablet  1   Current Facility-Administered Medications on File Prior to Visit  Medication Dose Route Frequency Provider Last Rate Last Dose  . phenylephrine (NEO-SYNEPHRINE) injection    Anesthesia Intra-op Terrill Mohr, CRNA   40 mcg at 06/13/13 1125    Allergies  Allergen Reactions  . Procaine Hcl     Unknown reaction    Past Medical History  Diagnosis Date  . S/P CABG (coronary artery bypass graft) 2002    SVG to PDA, Free radial to Intermediate, LIMA to LAD by Dr Prescott Gum  . HTN (hypertension)   . Hypercholesteremia   . LV dysfunction     EF 35 to 40% per echo May 2012; EF remains 35 to 40% per echo Feb 2013. Referred for ICD  . Chronic anticoagulation     on coumadin  . 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 High risk medication use     on amiodarone  . Atrial fibrillation     ON AMIODARONE: PER VISIT NOTE IN [facility]  . Pulmonary hypertension, moderate to severe   . Cataract immature     BILATERAL  . Osteoarthritis of right shoulder region     ACUTE PAIN  .  Rotator cuff tear 2012  . Anemia     Referral to GI Feb 2013  . Chronic kidney disease (CKD), stage II (mild)   . Myocardial infarction     Past Surgical History  Procedure Laterality Date  . Right leg surgeries      due to polio  . Left knee arthroscopy  2000  . Cardiac catheterization  2009    Grafts patent  . Cardiac catheterization  2012    Grafts patent. EF was 25 to 30% but 35 to 40 by echo  . Cardiac catheterization  2003  . Coronary artery bypass graft  2002    X3 VESSEL  . Cardioversion  2008--  FOR PAF    SUCCESSFUL  . Right shoulder rotator cuffe repair  05/16/11  . Cardioversion N/A 06/13/2013    Procedure: CARDIOVERSION;  Surgeon: Peter M Martinique, MD;  Location: [facility] ENDOSCOPY;  Service: Cardiovascular;  Laterality: N/A;    History  Smoking status  . Former Smoker  . Types: Cigarettes  . Quit date: 05/12/1979  Smokeless tobacco  . Never Used    Comment: stopped smoking  over 30 years ago.    History  Alcohol Use  . 8.4 oz/week  . 14 Cans of beer per week    Comment: couple beers and a shot of crown royal most days    Family History  Problem Relation Age of Onset  . Valvular heart disease Father   . Colon cancer Neg Hx   . Colon polyps Neg Hx   . Rectal cancer Neg Hx   . Stomach cancer Neg Hx     Review of Systems: The review of systems is per the HPI.  All other systems were reviewed and are negative.  Physical Exam: BP 112/58  Pulse 57  Ht 6\' 3"  (1.905 m)  Wt 242 lb 1.9 oz (109.825 kg)  BMI 30.26 kg/m2 Patient is very pleasant and in no acute distress. He is obese. Skin is warm and dry. Color is normal.  HEENT is unremarkable. Normocephalic/atraumatic. PERRL. Sclera are nonicteric. Neck is supple. No masses. No significant JVD.. Lungs are clear. Cardiac exam shows an irregular rhythm. His rate is controlled. Abdomen is soft without definite ascites. Extremities reveal no edema on the left. He has chronic musculoskeletal deformity of the right lower extremity. Gait and ROM are intact. No gross neurologic deficits noted.   LABORATORY DATA:  INR/Prothrombin Time: 2.1  Ecg shows sinus brady with rate 57 bpm. Nonspecific IVCD, old septal infarct   Assessment / Plan: 1. Chronic congestive heart failure secondary to systolic dysfunction. Ejection fraction of 35%. Clinically he appears to be well compensated.  He has not tolerated Aldactone or higher doses of ACE inhibitor and Coreg due to hypotension and renal insufficiency.   2. Coronary disease status post CABG in 2002.  3. Atrial fibrillation- recurrent. Last documented Afib was in 2008. Now s/p DCCV on 06/13/13. Continue amiodarone and coumadin. Follow up in 3 months.  4. Chronic kidney disease stage 2-3.   5. Anemia of chronic disease.  6. Hypertension.  7. Hyponatremia related to diuresis.

## 2013-06-27 ENCOUNTER — Ambulatory Visit: Payer: Medicare Other | Admitting: Cardiology

## 2013-07-08 ENCOUNTER — Ambulatory Visit (INDEPENDENT_AMBULATORY_CARE_PROVIDER_SITE_OTHER): Payer: Medicare Other

## 2013-07-08 DIAGNOSIS — Z7901 Long term (current) use of anticoagulants: Secondary | ICD-10-CM

## 2013-07-08 DIAGNOSIS — Z5181 Encounter for therapeutic drug level monitoring: Secondary | ICD-10-CM

## 2013-07-08 DIAGNOSIS — I4891 Unspecified atrial fibrillation: Secondary | ICD-10-CM

## 2013-07-08 LAB — POCT INR: INR: 3

## 2013-07-09 ENCOUNTER — Ambulatory Visit: Payer: Medicare Other | Admitting: Cardiology

## 2013-08-05 ENCOUNTER — Ambulatory Visit (INDEPENDENT_AMBULATORY_CARE_PROVIDER_SITE_OTHER): Payer: Medicare Other | Admitting: Pharmacist

## 2013-08-05 DIAGNOSIS — Z5181 Encounter for therapeutic drug level monitoring: Secondary | ICD-10-CM

## 2013-08-05 DIAGNOSIS — I4891 Unspecified atrial fibrillation: Secondary | ICD-10-CM

## 2013-08-05 DIAGNOSIS — Z7901 Long term (current) use of anticoagulants: Secondary | ICD-10-CM

## 2013-08-05 LAB — POCT INR: INR: 3

## 2013-08-22 ENCOUNTER — Other Ambulatory Visit: Payer: Self-pay | Admitting: Cardiology

## 2013-08-23 ENCOUNTER — Other Ambulatory Visit: Payer: Self-pay | Admitting: Cardiology

## 2013-08-24 ENCOUNTER — Other Ambulatory Visit: Payer: Self-pay | Admitting: Cardiology

## 2013-09-02 ENCOUNTER — Ambulatory Visit (INDEPENDENT_AMBULATORY_CARE_PROVIDER_SITE_OTHER): Payer: Medicare Other | Admitting: Pharmacist Clinician (PhC)/ Clinical Pharmacy Specialist

## 2013-09-02 DIAGNOSIS — Z7901 Long term (current) use of anticoagulants: Secondary | ICD-10-CM

## 2013-09-02 DIAGNOSIS — Z5181 Encounter for therapeutic drug level monitoring: Secondary | ICD-10-CM

## 2013-09-02 DIAGNOSIS — I4891 Unspecified atrial fibrillation: Secondary | ICD-10-CM

## 2013-09-02 LAB — POCT INR: INR: 1.4

## 2013-09-25 ENCOUNTER — Ambulatory Visit (INDEPENDENT_AMBULATORY_CARE_PROVIDER_SITE_OTHER): Payer: Medicare Other | Admitting: Cardiology

## 2013-09-25 ENCOUNTER — Ambulatory Visit: Payer: Medicare Other | Admitting: Cardiology

## 2013-09-25 ENCOUNTER — Encounter: Payer: Self-pay | Admitting: Cardiology

## 2013-09-25 ENCOUNTER — Ambulatory Visit (INDEPENDENT_AMBULATORY_CARE_PROVIDER_SITE_OTHER): Payer: Medicare Other | Admitting: *Deleted

## 2013-09-25 VITALS — BP 118/68 | HR 65 | Ht 75.0 in | Wt 241.0 lb

## 2013-09-25 DIAGNOSIS — I4891 Unspecified atrial fibrillation: Secondary | ICD-10-CM

## 2013-09-25 DIAGNOSIS — I5022 Chronic systolic (congestive) heart failure: Secondary | ICD-10-CM

## 2013-09-25 DIAGNOSIS — E7800 Pure hypercholesterolemia, unspecified: Secondary | ICD-10-CM

## 2013-09-25 DIAGNOSIS — I10 Essential (primary) hypertension: Secondary | ICD-10-CM

## 2013-09-25 DIAGNOSIS — Z7901 Long term (current) use of anticoagulants: Secondary | ICD-10-CM

## 2013-09-25 DIAGNOSIS — I509 Heart failure, unspecified: Secondary | ICD-10-CM

## 2013-09-25 DIAGNOSIS — Z5181 Encounter for therapeutic drug level monitoring: Secondary | ICD-10-CM

## 2013-09-25 DIAGNOSIS — I2510 Atherosclerotic heart disease of native coronary artery without angina pectoris: Secondary | ICD-10-CM

## 2013-09-25 LAB — POCT INR: INR: 2.2

## 2013-09-25 NOTE — Progress Notes (Signed)
[facility]oi Date of Birth: 1940/08/17 Medical Record #[id number]  History of Present Illness: Jason Choi is seen today for a follow up of his congestive heart failure.   He had his CABG in 2002. He has a history of atrial fibrillation and is on chronic amiodarone and coumadin. His last cardioversion was in May 2008. Has pulmonary HTN. He has anemia of chronic disease.  His last echo in June 2014 showed an EF of 35 to 40% and given the severe biventricular enlargement, recent MRI demonstrated an ejection fraction of 34% with marked left ventricular enlargement and global hypokinesis. He has deferred defibrillator placement mainly due to to job restrictions.   He underwent successful DCCV on 06/13/13. He notes no change. He denies any SOB, dizziness, or chest pain. His energy level is good and he is actively working.    Current Outpatient Prescriptions on File Prior to Visit  Medication Sig Dispense Refill  . amiodarone (PACERONE) 200 MG tablet Take 1 tablet (200 mg total) by mouth daily.  90 tablet  3  . aspirin 81 MG tablet Take 81 mg by mouth daily.       Marland Kitchen atorvastatin (LIPITOR) 40 MG tablet TAKE 1 TABLET (40 MG TOTAL) BY MOUTH DAILY.  30 tablet  4  . carvedilol (COREG) 25 MG tablet Take 0.5 tablets (12.5 mg total) by mouth 2 (two) times daily with a meal.  90 tablet  3  . fish oil-omega-3 fatty acids 1000 MG capsule Take 1 g by mouth as needed.       . furosemide (LASIX) 80 MG tablet TAKE ONE TABLET (80 MG TOTAL) BY MOUTH TWO TIMES DAILY.  180 tablet  1  . Multiple Vitamin (MULTIVITAMIN) tablet Take 1 tablet by mouth daily.        . ramipril (ALTACE) 5 MG capsule TAKE 1 CAPSULE (5 MG TOTAL) BY MOUTH DAILY- DR HAS REDUCED DOSE TO 5 MG  90 capsule  0  . warfarin (COUMADIN) 5 MG tablet TAKE 1 TABLET (5 MG TOTAL) BY MOUTH DAILY AS DIRECTED.  90 tablet  0   No current facility-administered medications on file prior to visit.    Allergies  Allergen Reactions  . Procaine Hcl     Unknown  reaction    Past Medical History  Diagnosis Date  . S/P CABG (coronary artery bypass graft) 2002    SVG to PDA, Free radial to Intermediate, LIMA to LAD by Dr Prescott Gum  . HTN (hypertension)   . Hypercholesteremia   . LV dysfunction     EF 35 to 40% per echo May 2012; EF remains 35 to 40% per echo Feb 2013. Referred for ICD  . Chronic anticoagulation     on coumadin  . 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 High risk medication use     on amiodarone  . Atrial fibrillation     ON AMIODARONE: PER VISIT NOTE IN [facility]  . Pulmonary hypertension, moderate to severe   . Cataract immature     BILATERAL  . Osteoarthritis of right shoulder region     ACUTE PAIN  . Rotator cuff tear 2012  . Anemia     Referral to GI Feb 2013  . Chronic kidney disease (CKD), stage II (mild)   . Myocardial infarction     Past Surgical History  Procedure Laterality Date  . Right leg surgeries      due to polio  . Left  knee arthroscopy  2000  . Cardiac catheterization  2009    Grafts patent  . Cardiac catheterization  2012    Grafts patent. EF was 25 to 30% but 35 to 40 by echo  . Cardiac catheterization  2003  . Coronary artery bypass graft  2002    X3 VESSEL  . Cardioversion  2008--  FOR PAF    SUCCESSFUL  . Right shoulder rotator cuffe repair  05/16/11  . Cardioversion N/A 06/13/2013    Procedure: CARDIOVERSION;  Surgeon: Douglas Rooks M Martinique, MD;  Location: [facility] ENDOSCOPY;  Service: Cardiovascular;  Laterality: N/A;    History  Smoking status  . Former Smoker  . Types: Cigarettes  . Quit date: 05/12/1979  Smokeless tobacco  . Never Used    Comment: stopped smoking over 30 years ago.    History  Alcohol Use  . 8.4 oz/week  . 14 Cans of beer per week    Comment: couple beers and a shot of crown royal most days    Family History  Problem Relation Age of Onset  . Valvular heart disease Father   . Colon cancer Neg Hx   . Colon polyps Neg Hx   . Rectal cancer Neg Hx   .  Stomach cancer Neg Hx     Review of Systems: The review of systems is per the HPI.  All other systems were reviewed and are negative.  Physical Exam: BP 118/68  Pulse 65  Ht 6\' 3"  (1.905 m)  Wt 241 lb (109.317 kg)  BMI 30.12 kg/m2  SpO2 93% Patient is very pleasant and in no acute distress. He is obese. Skin is warm and dry. Color is normal.  HEENT is unremarkable. Normocephalic/atraumatic. PERRL. Sclera are nonicteric. Neck is supple. No masses. No significant JVD.. Lungs are clear. Cardiac exam shows an irregular rhythm. His rate is controlled. Abdomen is soft without definite ascites. Extremities reveal no edema on the left. He has chronic musculoskeletal deformity of the right lower extremity. Gait and ROM are intact. No gross neurologic deficits noted.   LABORATORY DATA:  INR/Prothrombin Time: 2.2   Assessment / Plan: 1. Chronic congestive heart failure secondary to systolic dysfunction. Ejection fraction of 35%. Clinically he appears to be well compensated.  He has not tolerated Aldactone or higher doses of ACE inhibitor and Coreg due to hypotension and renal insufficiency. Will continue his current therapy.  2. Coronary disease status post CABG in 2002.  3. Atrial fibrillation- recurrent. Last documented Afib was in 2008. Now s/p DCCV on 06/13/13. Continue amiodarone and coumadin. I will follow up in 4 months with labs to follow up on amiodarone.  4. Chronic kidney disease stage 2-3.   5. Anemia of chronic disease.  6. Hypertension.  7. Hyponatremia related to diuresis.

## 2013-09-25 NOTE — Patient Instructions (Signed)
Continue your current therapy  I will see you in 6 months.   

## 2013-10-23 ENCOUNTER — Other Ambulatory Visit: Payer: Self-pay | Admitting: *Deleted

## 2013-10-23 MED ORDER — AMIODARONE HCL 200 MG PO TABS: 200.0000 mg | ORAL_TABLET | Freq: Every day | ORAL | Status: DC

## 2013-10-24 ENCOUNTER — Other Ambulatory Visit: Payer: Self-pay

## 2013-10-24 ENCOUNTER — Ambulatory Visit (INDEPENDENT_AMBULATORY_CARE_PROVIDER_SITE_OTHER): Payer: Medicare Other | Admitting: Pharmacist

## 2013-10-24 DIAGNOSIS — Z5181 Encounter for therapeutic drug level monitoring: Secondary | ICD-10-CM

## 2013-10-24 DIAGNOSIS — I4891 Unspecified atrial fibrillation: Secondary | ICD-10-CM

## 2013-10-24 DIAGNOSIS — Z7901 Long term (current) use of anticoagulants: Secondary | ICD-10-CM

## 2013-10-24 LAB — POCT INR: INR: 2

## 2013-10-24 MED ORDER — ATORVASTATIN CALCIUM 40 MG PO TABS: 40.0000 mg | ORAL_TABLET | Freq: Every day | ORAL | Status: DC

## 2013-10-24 MED ORDER — RAMIPRIL 5 MG PO CAPS: 5.0000 mg | ORAL_CAPSULE | Freq: Every day | ORAL | Status: DC

## 2013-10-24 MED ORDER — AMIODARONE HCL 200 MG PO TABS: 200.0000 mg | ORAL_TABLET | Freq: Every day | ORAL | Status: DC

## 2013-10-24 MED ORDER — FUROSEMIDE 80 MG PO TABS
80.0000 mg | ORAL_TABLET | Freq: Two times a day (BID) | ORAL | Status: DC
Start: 2013-10-24 — End: 2014-12-08

## 2013-10-24 MED ORDER — POTASSIUM CHLORIDE CRYS ER 20 MEQ PO TBCR: 20.0000 meq | EXTENDED_RELEASE_TABLET | Freq: Every day | ORAL | Status: DC

## 2013-10-25 ENCOUNTER — Telehealth: Payer: Self-pay | Admitting: Internal Medicine

## 2013-10-25 NOTE — Telephone Encounter (Signed)
No phone call 

## 2013-11-20 ENCOUNTER — Other Ambulatory Visit: Payer: Self-pay | Admitting: *Deleted

## 2013-11-20 MED ORDER — WARFARIN SODIUM 5 MG PO TABS: ORAL_TABLET | ORAL | Status: DC

## 2013-11-21 ENCOUNTER — Ambulatory Visit (INDEPENDENT_AMBULATORY_CARE_PROVIDER_SITE_OTHER): Payer: Medicare Other | Admitting: Pharmacist

## 2013-11-21 DIAGNOSIS — I4891 Unspecified atrial fibrillation: Secondary | ICD-10-CM

## 2013-11-21 DIAGNOSIS — Z7901 Long term (current) use of anticoagulants: Secondary | ICD-10-CM

## 2013-11-21 DIAGNOSIS — Z5181 Encounter for therapeutic drug level monitoring: Secondary | ICD-10-CM

## 2013-11-21 LAB — POCT INR: INR: 3.4

## 2013-12-10 ENCOUNTER — Other Ambulatory Visit: Payer: Self-pay | Admitting: Cardiology

## 2013-12-19 ENCOUNTER — Ambulatory Visit (INDEPENDENT_AMBULATORY_CARE_PROVIDER_SITE_OTHER): Payer: Medicare Other | Admitting: Pharmacist

## 2013-12-19 DIAGNOSIS — Z5181 Encounter for therapeutic drug level monitoring: Secondary | ICD-10-CM

## 2013-12-19 DIAGNOSIS — Z7901 Long term (current) use of anticoagulants: Secondary | ICD-10-CM

## 2013-12-19 DIAGNOSIS — I4891 Unspecified atrial fibrillation: Secondary | ICD-10-CM

## 2013-12-19 LAB — POCT INR: INR: 3.8

## 2013-12-26 ENCOUNTER — Ambulatory Visit: Payer: Medicare Other | Admitting: Cardiology

## 2014-01-02 ENCOUNTER — Ambulatory Visit (INDEPENDENT_AMBULATORY_CARE_PROVIDER_SITE_OTHER): Payer: Medicare Other | Admitting: Pharmacist Clinician (PhC)/ Clinical Pharmacy Specialist

## 2014-01-02 DIAGNOSIS — I4891 Unspecified atrial fibrillation: Secondary | ICD-10-CM

## 2014-01-02 DIAGNOSIS — Z7901 Long term (current) use of anticoagulants: Secondary | ICD-10-CM

## 2014-01-02 DIAGNOSIS — Z5181 Encounter for therapeutic drug level monitoring: Secondary | ICD-10-CM

## 2014-01-02 LAB — POCT INR: INR: 1.7

## 2014-01-16 ENCOUNTER — Ambulatory Visit (INDEPENDENT_AMBULATORY_CARE_PROVIDER_SITE_OTHER): Payer: Medicare Other | Admitting: Pharmacist Clinician (PhC)/ Clinical Pharmacy Specialist

## 2014-01-16 DIAGNOSIS — Z5181 Encounter for therapeutic drug level monitoring: Secondary | ICD-10-CM

## 2014-01-16 DIAGNOSIS — I4891 Unspecified atrial fibrillation: Secondary | ICD-10-CM

## 2014-01-16 DIAGNOSIS — Z7901 Long term (current) use of anticoagulants: Secondary | ICD-10-CM

## 2014-01-16 LAB — POCT INR: INR: 2.9

## 2014-02-13 ENCOUNTER — Ambulatory Visit (INDEPENDENT_AMBULATORY_CARE_PROVIDER_SITE_OTHER): Payer: Medicare Other | Admitting: Pharmacist Clinician (PhC)/ Clinical Pharmacy Specialist

## 2014-02-13 DIAGNOSIS — I4891 Unspecified atrial fibrillation: Secondary | ICD-10-CM

## 2014-02-13 DIAGNOSIS — Z7901 Long term (current) use of anticoagulants: Secondary | ICD-10-CM

## 2014-02-13 DIAGNOSIS — Z5181 Encounter for therapeutic drug level monitoring: Secondary | ICD-10-CM

## 2014-02-13 LAB — POCT INR: INR: 3.5

## 2014-03-06 ENCOUNTER — Ambulatory Visit (INDEPENDENT_AMBULATORY_CARE_PROVIDER_SITE_OTHER): Payer: Medicare Other | Admitting: Pharmacist Clinician (PhC)/ Clinical Pharmacy Specialist

## 2014-03-06 DIAGNOSIS — Z7901 Long term (current) use of anticoagulants: Secondary | ICD-10-CM

## 2014-03-06 DIAGNOSIS — I4891 Unspecified atrial fibrillation: Secondary | ICD-10-CM

## 2014-03-06 DIAGNOSIS — Z5181 Encounter for therapeutic drug level monitoring: Secondary | ICD-10-CM

## 2014-03-06 LAB — POCT INR: INR: 1.9

## 2014-04-03 ENCOUNTER — Ambulatory Visit (INDEPENDENT_AMBULATORY_CARE_PROVIDER_SITE_OTHER): Payer: Medicare Other | Admitting: Pharmacist Clinician (PhC)/ Clinical Pharmacy Specialist

## 2014-04-03 DIAGNOSIS — Z5181 Encounter for therapeutic drug level monitoring: Secondary | ICD-10-CM

## 2014-04-03 DIAGNOSIS — Z7901 Long term (current) use of anticoagulants: Secondary | ICD-10-CM

## 2014-04-03 DIAGNOSIS — I4891 Unspecified atrial fibrillation: Secondary | ICD-10-CM

## 2014-04-03 LAB — POCT INR: INR: 1.9

## 2014-04-07 ENCOUNTER — Other Ambulatory Visit: Payer: Self-pay | Admitting: Cardiology

## 2014-04-17 ENCOUNTER — Ambulatory Visit: Payer: Medicare Other | Admitting: Pharmacist Clinician (PhC)/ Clinical Pharmacy Specialist

## 2014-05-01 ENCOUNTER — Ambulatory Visit (INDEPENDENT_AMBULATORY_CARE_PROVIDER_SITE_OTHER): Payer: Medicare Other | Admitting: Pharmacist Clinician (PhC)/ Clinical Pharmacy Specialist

## 2014-05-01 DIAGNOSIS — I4891 Unspecified atrial fibrillation: Secondary | ICD-10-CM

## 2014-05-01 DIAGNOSIS — Z5181 Encounter for therapeutic drug level monitoring: Secondary | ICD-10-CM

## 2014-05-01 DIAGNOSIS — Z7901 Long term (current) use of anticoagulants: Secondary | ICD-10-CM

## 2014-05-01 LAB — POCT INR: INR: 2.2

## 2014-06-05 ENCOUNTER — Ambulatory Visit (INDEPENDENT_AMBULATORY_CARE_PROVIDER_SITE_OTHER): Payer: Medicare Other | Admitting: Pharmacist Clinician (PhC)/ Clinical Pharmacy Specialist

## 2014-06-05 DIAGNOSIS — Z5181 Encounter for therapeutic drug level monitoring: Secondary | ICD-10-CM

## 2014-06-05 LAB — POCT INR: INR: 3

## 2014-06-19 ENCOUNTER — Other Ambulatory Visit: Payer: Self-pay | Admitting: Cardiology

## 2014-07-08 ENCOUNTER — Other Ambulatory Visit (HOSPITAL_COMMUNITY): Payer: Self-pay | Admitting: Cardiovascular Disease

## 2014-07-09 ENCOUNTER — Telehealth: Payer: Self-pay | Admitting: Cardiology

## 2014-07-09 ENCOUNTER — Other Ambulatory Visit: Payer: Self-pay

## 2014-07-09 MED ORDER — CARVEDILOL 25 MG PO TABS: ORAL_TABLET | ORAL | Status: DC

## 2014-07-09 NOTE — Telephone Encounter (Signed)
Follow up     Pt called for a 6 month follow up please give him a call or make a appt and mail letter ASAP  Early morning.  Pt needs re-fill on COREG called in please. On last pill.

## 2014-07-09 NOTE — Telephone Encounter (Signed)
Refill sent to the pharmacy. Will forward to admin for scheduling.

## 2014-07-17 ENCOUNTER — Ambulatory Visit: Payer: Medicare Other | Admitting: Pharmacist Clinician (PhC)/ Clinical Pharmacy Specialist

## 2014-07-20 ENCOUNTER — Other Ambulatory Visit: Payer: Self-pay | Admitting: Cardiology

## 2014-07-22 ENCOUNTER — Telehealth: Payer: Self-pay | Admitting: Cardiology

## 2014-07-22 NOTE — Telephone Encounter (Signed)
Received records from [facility] for appointment with Dr Martinique on 09/07/14.  Records given to [facility] (medical records) for Dr Doug Sou schedule on 09/07/14.

## 2014-07-24 ENCOUNTER — Ambulatory Visit (INDEPENDENT_AMBULATORY_CARE_PROVIDER_SITE_OTHER): Payer: Medicare Other | Admitting: Pharmacist Clinician (PhC)/ Clinical Pharmacy Specialist

## 2014-07-24 DIAGNOSIS — Z5181 Encounter for therapeutic drug level monitoring: Secondary | ICD-10-CM

## 2014-07-24 DIAGNOSIS — Z7901 Long term (current) use of anticoagulants: Secondary | ICD-10-CM

## 2014-07-24 DIAGNOSIS — I4891 Unspecified atrial fibrillation: Secondary | ICD-10-CM

## 2014-07-24 LAB — POCT INR: INR: 1.8

## 2014-07-28 ENCOUNTER — Other Ambulatory Visit: Payer: Self-pay | Admitting: Cardiology

## 2014-07-29 NOTE — Telephone Encounter (Signed)
Is the patient still to be taking this? Please advise. Thanks, MI

## 2014-08-12 ENCOUNTER — Encounter: Payer: Self-pay | Admitting: Gastroenterology

## 2014-08-21 ENCOUNTER — Ambulatory Visit (INDEPENDENT_AMBULATORY_CARE_PROVIDER_SITE_OTHER): Payer: Medicare Other | Admitting: Pharmacist Clinician (PhC)/ Clinical Pharmacy Specialist

## 2014-08-21 DIAGNOSIS — Z5181 Encounter for therapeutic drug level monitoring: Secondary | ICD-10-CM

## 2014-08-21 DIAGNOSIS — Z7901 Long term (current) use of anticoagulants: Secondary | ICD-10-CM | POA: Diagnosis not present

## 2014-08-21 DIAGNOSIS — I4891 Unspecified atrial fibrillation: Secondary | ICD-10-CM

## 2014-08-21 LAB — POCT INR: INR: 3.3

## 2014-08-27 ENCOUNTER — Telehealth: Payer: Self-pay | Admitting: Cardiology

## 2014-08-27 NOTE — Telephone Encounter (Signed)
Dentist office called, pt in chair for cleaning. He had told them due to cardiac history he would require premedication - they called to see what we would advise.  I reviewed history, could not find history of MV repair/replacement.  Dr. Claiborne Billings reviewed last office notes & problem list for me, noted no history which would indicate premed use.  I returned call to dentist office, advised them on this. Understanding verbalized.

## 2014-09-07 ENCOUNTER — Encounter: Payer: Self-pay | Admitting: Cardiology

## 2014-09-07 ENCOUNTER — Ambulatory Visit (INDEPENDENT_AMBULATORY_CARE_PROVIDER_SITE_OTHER): Payer: Medicare Other | Admitting: Pharmacist Clinician (PhC)/ Clinical Pharmacy Specialist

## 2014-09-07 ENCOUNTER — Ambulatory Visit (INDEPENDENT_AMBULATORY_CARE_PROVIDER_SITE_OTHER): Payer: Medicare Other | Admitting: Cardiology

## 2014-09-07 VITALS — BP 120/62 | HR 59 | Ht 75.0 in | Wt 227.8 lb

## 2014-09-07 DIAGNOSIS — E7800 Pure hypercholesterolemia, unspecified: Secondary | ICD-10-CM

## 2014-09-07 DIAGNOSIS — I5022 Chronic systolic (congestive) heart failure: Secondary | ICD-10-CM | POA: Diagnosis not present

## 2014-09-07 DIAGNOSIS — I4891 Unspecified atrial fibrillation: Secondary | ICD-10-CM

## 2014-09-07 DIAGNOSIS — I481 Persistent atrial fibrillation: Secondary | ICD-10-CM

## 2014-09-07 DIAGNOSIS — I4819 Other persistent atrial fibrillation: Secondary | ICD-10-CM

## 2014-09-07 DIAGNOSIS — I2510 Atherosclerotic heart disease of native coronary artery without angina pectoris: Secondary | ICD-10-CM

## 2014-09-07 DIAGNOSIS — Z7901 Long term (current) use of anticoagulants: Secondary | ICD-10-CM

## 2014-09-07 DIAGNOSIS — Z5181 Encounter for therapeutic drug level monitoring: Secondary | ICD-10-CM

## 2014-09-07 LAB — POCT INR: INR: 2.7

## 2014-09-07 NOTE — Progress Notes (Signed)
Jason Choi Date of Birth: 04/18/1941 Medical Record #[id number]  History of Present Illness: Jason Choi is seen today for a follow up of his congestive heart failure.   He had his CABG in 2002. He has a history of atrial fibrillation and is on chronic amiodarone and coumadin. His last cardioversion was in January 2015.  He has pulmonary HTN. He has anemia of chronic disease.  His last echo in June 2014 showed an EF of 35 to 40% and given the severe biventricular enlargement, MRI demonstrated an ejection fraction of 34% with marked left ventricular enlargement and global hypokinesis. He has deferred defibrillator placement mainly due to to job restrictions.   He denies any SOB, dizziness, or chest pain. His energy level is good and he is actively working. He has lost 15 lbs. He recently had tendonitis in his left heel. He is plagued by arthritis. He has no symptoms of palpitations or dizziness.    Current Outpatient Prescriptions on File Prior to Visit  Medication Sig Dispense Refill  . allopurinol (ZYLOPRIM) 100 MG tablet Take 100 mg by mouth daily.    Marland Kitchen aspirin 81 MG tablet Take 81 mg by mouth daily.     Marland Kitchen atorvastatin (LIPITOR) 40 MG tablet Take 1 tablet (40 mg total) by mouth daily at 6 PM. 90 tablet 3  . carvedilol (COREG) 25 MG tablet TAKE 0.5(HALF) TABLET (12.5 MG TOTAL) BY MOUTH 2 (TWO) TIMES DAILY WITH A MEAL. 90 tablet 3  . fish oil-omega-3 fatty acids 1000 MG capsule Take 1 g by mouth as needed.     . furosemide (LASIX) 80 MG tablet Take 1 tablet (80 mg total) by mouth 2 (two) times daily. 180 tablet 3  . KLOR-CON M20 20 MEQ tablet TAKE 1 TABLET BY MOUTH TWICE DAILY 60 tablet 1  . Multiple Vitamin (MULTIVITAMIN) tablet Take 1 tablet by mouth daily.      . potassium chloride SA (K-DUR,KLOR-CON) 20 MEQ tablet Take 1 tablet (20 mEq total) by mouth daily. 90 tablet 3  . ramipril (ALTACE) 5 MG capsule Take 1 capsule (5 mg total) by mouth daily. 90 capsule 3  . warfarin (COUMADIN) 5 MG  tablet Take 1 tablet by mouth daily or as directed by coumadin clinic 90 tablet 1   No current facility-administered medications on file prior to visit.    Allergies  Allergen Reactions  . Procaine Hcl     Unknown reaction    Past Medical History  Diagnosis Date  . S/P CABG (coronary artery bypass graft) 2002    SVG to PDA, Free radial to Intermediate, LIMA to LAD by Dr Prescott Gum  . HTN (hypertension)   . Hypercholesteremia   . LV dysfunction     EF 35 to 40% per echo May 2012; EF remains 35 to 40% per echo Feb 2013. Referred for ICD  . Chronic anticoagulation     on coumadin  . 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 High risk medication use     on amiodarone  . Atrial fibrillation     ON AMIODARONE: PER VISIT NOTE IN [facility]  . Pulmonary hypertension, moderate to severe   . Cataract immature     BILATERAL  . Osteoarthritis of right shoulder region     ACUTE PAIN  . Rotator cuff tear 2012  . Anemia     Referral to GI Feb 2013  . Chronic kidney disease (CKD), stage II (mild)   .  Myocardial infarction     Past Surgical History  Procedure Laterality Date  . Right leg surgeries      due to polio  . Left knee arthroscopy  2000  . Cardiac catheterization  2009    Grafts patent  . Cardiac catheterization  2012    Grafts patent. EF was 25 to 30% but 35 to 40 by echo  . Cardiac catheterization  2003  . Coronary artery bypass graft  2002    X3 VESSEL  . Cardioversion  2008--  FOR PAF    SUCCESSFUL  . Right shoulder rotator cuffe repair  05/16/11  . Cardioversion N/A 06/13/2013    Procedure: CARDIOVERSION;  Surgeon: Peter M Martinique, MD;  Location: [facility] ENDOSCOPY;  Service: Cardiovascular;  Laterality: N/A;    History  Smoking status  . Former Smoker  . Types: Cigarettes  . Quit date: 05/12/1979  Smokeless tobacco  . Never Used    Comment: stopped smoking over 30 years ago.    History  Alcohol Use  . 8.4 oz/week  . 14 Cans of beer per week     Comment: couple beers and a shot of crown royal most days    Family History  Problem Relation Age of Onset  . Valvular heart disease Father   . Colon cancer Neg Hx   . Colon polyps Neg Hx   . Rectal cancer Neg Hx   . Stomach cancer Neg Hx     Review of Systems: The review of systems is per the HPI.  All other systems were reviewed and are negative.  Physical Exam: BP 120/62 mmHg  Pulse 59  Ht 6\' 3"  (1.905 m)  Wt 227 lb 12.8 oz (103.329 kg)  BMI 28.47 kg/m2 Patient is very pleasant and in no acute distress. He is obese. Skin is warm and dry. Color is normal.  HEENT is unremarkable. Normocephalic/atraumatic. PERRL. Sclera are nonicteric. Neck is supple. No masses. No significant JVD.. Lungs are clear. Cardiac exam shows an irregular rhythm. His rate is controlled. Abdomen is soft without definite ascites. Extremities reveal no edema on the left. He has chronic musculoskeletal deformity of the right lower extremity. Gait and ROM are intact. No gross neurologic deficits noted.   LABORATORY DATA:  INR/Prothrombin Time: 2.7.  Ecg today shows atrial fibrillation with rate 59 bpm. Low voltage. Old inferior MI. I have personally reviewed and interpreted this study.    Assessment / Plan: 1. Chronic congestive heart failure secondary to systolic dysfunction. Ejection fraction of 35%. Clinically he appears to be well compensated.  He has not tolerated Aldactone or higher doses of ACE inhibitor and Coreg due to hypotension and renal insufficiency. Will continue his current therapy.  2. Coronary disease status post CABG in 2002.  3. Atrial fibrillation- recurrent. s/p DCCV on 06/13/13. He is asymptomatic and rate is well controlled if not slow. It is impossible to say when he converted back to Afib. Since he is tolerating it well I have recommended stopping amiodarone. continue Coreg for rate control. Continue  coumadin. Will make coumadin clinic aware that we are stopping amiodarone. I will  follow up in 4 months.  4. Chronic kidney disease stage 3. Labs followed by Dr. Posey Pronto. Request copy of lab work.   5. Anemia of chronic disease.  6. Hypertension.  7. Hyponatremia related to diuresis.

## 2014-09-07 NOTE — Patient Instructions (Signed)
Stop taking amiodarone   Continue your other therapy  I will see you in 4 months.   Have Dr. Posey Pronto send me your lab work

## 2014-09-07 NOTE — Addendum Note (Signed)
Addended by: Rockne Menghini on: 09/07/2014 04:01 PM   Modules accepted: Level of Service

## 2014-09-24 ENCOUNTER — Ambulatory Visit (INDEPENDENT_AMBULATORY_CARE_PROVIDER_SITE_OTHER): Payer: Medicare Other | Admitting: Pharmacist Clinician (PhC)/ Clinical Pharmacy Specialist

## 2014-09-24 DIAGNOSIS — I481 Persistent atrial fibrillation: Secondary | ICD-10-CM | POA: Diagnosis not present

## 2014-09-24 DIAGNOSIS — I4891 Unspecified atrial fibrillation: Secondary | ICD-10-CM

## 2014-09-24 DIAGNOSIS — Z5181 Encounter for therapeutic drug level monitoring: Secondary | ICD-10-CM | POA: Diagnosis not present

## 2014-09-24 DIAGNOSIS — Z7901 Long term (current) use of anticoagulants: Secondary | ICD-10-CM

## 2014-09-24 DIAGNOSIS — I4819 Other persistent atrial fibrillation: Secondary | ICD-10-CM

## 2014-09-24 LAB — POCT INR: INR: 3

## 2014-10-03 ENCOUNTER — Other Ambulatory Visit: Payer: Self-pay | Admitting: Cardiology

## 2014-10-09 ENCOUNTER — Ambulatory Visit (INDEPENDENT_AMBULATORY_CARE_PROVIDER_SITE_OTHER): Payer: Medicare Other | Admitting: Pharmacist Clinician (PhC)/ Clinical Pharmacy Specialist

## 2014-10-09 ENCOUNTER — Telehealth: Payer: Self-pay | Admitting: Cardiology

## 2014-10-09 DIAGNOSIS — I481 Persistent atrial fibrillation: Secondary | ICD-10-CM

## 2014-10-09 DIAGNOSIS — Z7901 Long term (current) use of anticoagulants: Secondary | ICD-10-CM

## 2014-10-09 DIAGNOSIS — I4819 Other persistent atrial fibrillation: Secondary | ICD-10-CM

## 2014-10-09 DIAGNOSIS — Z5181 Encounter for therapeutic drug level monitoring: Secondary | ICD-10-CM

## 2014-10-09 DIAGNOSIS — I4891 Unspecified atrial fibrillation: Secondary | ICD-10-CM | POA: Diagnosis not present

## 2014-10-09 LAB — POCT INR: INR: 2.4

## 2014-10-09 NOTE — Telephone Encounter (Signed)
Close encounter 

## 2014-11-06 ENCOUNTER — Ambulatory Visit (INDEPENDENT_AMBULATORY_CARE_PROVIDER_SITE_OTHER): Payer: Medicare Other | Admitting: Pharmacist Clinician (PhC)/ Clinical Pharmacy Specialist

## 2014-11-06 DIAGNOSIS — I4891 Unspecified atrial fibrillation: Secondary | ICD-10-CM | POA: Diagnosis not present

## 2014-11-06 DIAGNOSIS — Z5181 Encounter for therapeutic drug level monitoring: Secondary | ICD-10-CM

## 2014-11-06 DIAGNOSIS — I481 Persistent atrial fibrillation: Secondary | ICD-10-CM

## 2014-11-06 DIAGNOSIS — Z7901 Long term (current) use of anticoagulants: Secondary | ICD-10-CM | POA: Diagnosis not present

## 2014-11-06 DIAGNOSIS — I4819 Other persistent atrial fibrillation: Secondary | ICD-10-CM

## 2014-11-06 LAB — POCT INR: INR: 3.8

## 2014-11-27 ENCOUNTER — Ambulatory Visit (INDEPENDENT_AMBULATORY_CARE_PROVIDER_SITE_OTHER): Payer: Medicare Other | Admitting: Pharmacist Clinician (PhC)/ Clinical Pharmacy Specialist

## 2014-11-27 DIAGNOSIS — I4891 Unspecified atrial fibrillation: Secondary | ICD-10-CM

## 2014-11-27 DIAGNOSIS — Z7901 Long term (current) use of anticoagulants: Secondary | ICD-10-CM

## 2014-11-27 DIAGNOSIS — I4819 Other persistent atrial fibrillation: Secondary | ICD-10-CM

## 2014-11-27 DIAGNOSIS — Z5181 Encounter for therapeutic drug level monitoring: Secondary | ICD-10-CM

## 2014-11-27 DIAGNOSIS — I481 Persistent atrial fibrillation: Secondary | ICD-10-CM | POA: Diagnosis not present

## 2014-11-27 LAB — POCT INR: INR: 2.8

## 2014-12-02 ENCOUNTER — Telehealth: Payer: Self-pay | Admitting: Cardiology

## 2014-12-02 NOTE — Telephone Encounter (Signed)
Received records from [facility] for appointment on 01/22/15 with Dr Martinique.  Records given to [facility] (medical records) for Dr Doug Sou schedule on 01/22/15. lp

## 2014-12-08 ENCOUNTER — Other Ambulatory Visit: Payer: Self-pay | Admitting: Cardiology

## 2014-12-16 ENCOUNTER — Other Ambulatory Visit: Payer: Self-pay | Admitting: Cardiology

## 2014-12-25 ENCOUNTER — Ambulatory Visit (INDEPENDENT_AMBULATORY_CARE_PROVIDER_SITE_OTHER): Payer: Medicare Other | Admitting: Pharmacist Clinician (PhC)/ Clinical Pharmacy Specialist

## 2014-12-25 DIAGNOSIS — Z7901 Long term (current) use of anticoagulants: Secondary | ICD-10-CM | POA: Diagnosis not present

## 2014-12-25 DIAGNOSIS — I4819 Other persistent atrial fibrillation: Secondary | ICD-10-CM

## 2014-12-25 DIAGNOSIS — I481 Persistent atrial fibrillation: Secondary | ICD-10-CM

## 2014-12-25 DIAGNOSIS — I4891 Unspecified atrial fibrillation: Secondary | ICD-10-CM

## 2014-12-25 DIAGNOSIS — Z5181 Encounter for therapeutic drug level monitoring: Secondary | ICD-10-CM

## 2014-12-25 LAB — POCT INR: INR: 2.7

## 2014-12-28 ENCOUNTER — Other Ambulatory Visit: Payer: Self-pay | Admitting: Cardiology

## 2015-01-17 ENCOUNTER — Other Ambulatory Visit: Payer: Self-pay | Admitting: Cardiology

## 2015-01-22 ENCOUNTER — Encounter: Payer: Self-pay | Admitting: Cardiology

## 2015-01-22 ENCOUNTER — Ambulatory Visit (INDEPENDENT_AMBULATORY_CARE_PROVIDER_SITE_OTHER): Payer: Medicare Other | Admitting: Cardiology

## 2015-01-22 ENCOUNTER — Ambulatory Visit (INDEPENDENT_AMBULATORY_CARE_PROVIDER_SITE_OTHER): Payer: Medicare Other | Admitting: Pharmacist Clinician (PhC)/ Clinical Pharmacy Specialist

## 2015-01-22 VITALS — BP 106/58 | HR 84 | Ht 75.5 in | Wt 217.7 lb

## 2015-01-22 DIAGNOSIS — I4891 Unspecified atrial fibrillation: Secondary | ICD-10-CM

## 2015-01-22 DIAGNOSIS — Z7901 Long term (current) use of anticoagulants: Secondary | ICD-10-CM

## 2015-01-22 DIAGNOSIS — I4819 Other persistent atrial fibrillation: Secondary | ICD-10-CM

## 2015-01-22 DIAGNOSIS — I2510 Atherosclerotic heart disease of native coronary artery without angina pectoris: Secondary | ICD-10-CM | POA: Diagnosis not present

## 2015-01-22 DIAGNOSIS — I10 Essential (primary) hypertension: Secondary | ICD-10-CM

## 2015-01-22 DIAGNOSIS — I481 Persistent atrial fibrillation: Secondary | ICD-10-CM

## 2015-01-22 DIAGNOSIS — I5022 Chronic systolic (congestive) heart failure: Secondary | ICD-10-CM

## 2015-01-22 DIAGNOSIS — Z5181 Encounter for therapeutic drug level monitoring: Secondary | ICD-10-CM

## 2015-01-22 LAB — HEPATIC FUNCTION PANEL
ALBUMIN: 3.9 g/dL (ref 3.6–5.1)
ALK PHOS: 92 U/L (ref 40–115)
ALT: 29 U/L (ref 9–46)
AST: 30 U/L (ref 10–35)
Bilirubin, Direct: 0.2 mg/dL (ref <=0.2)
Indirect Bilirubin: 0.4 mg/dL (ref 0.2–1.2)
Total Bilirubin: 0.6 mg/dL (ref 0.2–1.2)
Total Protein: 6.3 g/dL (ref 6.1–8.1)

## 2015-01-22 LAB — CBC WITH DIFFERENTIAL/PLATELET
BASOS PCT: 1 % (ref 0–1)
Basophils Absolute: 0.1 10*3/uL (ref 0.0–0.1)
EOS PCT: 5 % (ref 0–5)
Eosinophils Absolute: 0.3 10*3/uL (ref 0.0–0.7)
HEMATOCRIT: 36 % — AB (ref 39.0–52.0)
HEMOGLOBIN: 12.1 g/dL — AB (ref 13.0–17.0)
Lymphocytes Relative: 15 % (ref 12–46)
Lymphs Abs: 0.9 10*3/uL (ref 0.7–4.0)
MCH: 31.2 pg (ref 26.0–34.0)
MCHC: 33.6 g/dL (ref 30.0–36.0)
MCV: 92.8 fL (ref 78.0–100.0)
MPV: 8.9 fL (ref 8.6–12.4)
Monocytes Absolute: 0.5 10*3/uL (ref 0.1–1.0)
Monocytes Relative: 8 % (ref 3–12)
Neutro Abs: 4.3 10*3/uL (ref 1.7–7.7)
Neutrophils Relative %: 71 % (ref 43–77)
Platelets: 291 10*3/uL (ref 150–400)
RBC: 3.88 MIL/uL — AB (ref 4.22–5.81)
RDW: 15.8 % — ABNORMAL HIGH (ref 11.5–15.5)
WBC: 6 10*3/uL (ref 4.0–10.5)

## 2015-01-22 LAB — BASIC METABOLIC PANEL
BUN: 59 mg/dL — ABNORMAL HIGH (ref 7–25)
CALCIUM: 9.3 mg/dL (ref 8.6–10.3)
CO2: 26 mmol/L (ref 20–31)
CREATININE: 2.16 mg/dL — AB (ref 0.70–1.18)
Chloride: 104 mmol/L (ref 98–110)
Glucose, Bld: 141 mg/dL — ABNORMAL HIGH (ref 65–99)
Potassium: 4.4 mmol/L (ref 3.5–5.3)
SODIUM: 140 mmol/L (ref 135–146)

## 2015-01-22 LAB — LIPID PANEL
CHOL/HDL RATIO: 3.2 ratio (ref <=5.0)
CHOLESTEROL: 149 mg/dL (ref 125–200)
HDL: 46 mg/dL (ref >=40)
LDL Cholesterol: 89 mg/dL (ref <130)
TRIGLYCERIDES: 70 mg/dL (ref <150)
VLDL: 14 mg/dL (ref <30)

## 2015-01-22 LAB — POCT INR: INR: 2.3

## 2015-01-22 NOTE — Patient Instructions (Signed)
We will check lab work today  Continue your current therapy  I will see you in 6 months.   

## 2015-01-22 NOTE — Progress Notes (Signed)
Jason Choi Date of Birth: [birth year] Medical Record #[id number]  History of Present Illness: Jason Choi is seen today for a follow up of his CHF.   He is s/p CABG in 2002. He has a history of atrial fibrillation and was on chronic amiodarone and coumadin. His last cardioversion was in January 2015. He reverted to Afib and his amiodarone was discontinued with rate control strategy only.  He has pulmonary HTN and CKD. He has anemia of chronic disease.  His last echo in June 2014 showed an EF of 35 to 40% and given the severe biventricular enlargement, MRI demonstrated an ejection fraction of 34% with marked left ventricular enlargement and global hypokinesis. He has deferred defibrillator placement mainly due to to job restrictions.   He denies any SOB, dizziness, or chest pain. His energy level is good and he is actively working. He has lost an additional 10 lbs. He recently had tendonitis in his left heel. He is plagued by arthritis. He has no symptoms of palpitations or dizziness.    Current Outpatient Prescriptions on File Prior to Visit  Medication Sig Dispense Refill  . allopurinol (ZYLOPRIM) 100 MG tablet Take 100 mg by mouth daily.    Marland Kitchen aspirin 81 MG tablet Take 81 mg by mouth daily.     Marland Kitchen atorvastatin (LIPITOR) 40 MG tablet TAKE 1 TABLET (40 MG TOTAL) BY MOUTH DAILY AT 6 PM. 90 tablet 0  . carvedilol (COREG) 25 MG tablet TAKE 0.5(HALF) TABLET (12.5 MG TOTAL) BY MOUTH 2 (TWO) TIMES DAILY WITH A MEAL. 90 tablet 3  . fish oil-omega-3 fatty acids 1000 MG capsule Take 1 g by mouth as needed.     . furosemide (LASIX) 80 MG tablet TAKE 1 TABLET (80 MG TOTAL) BY MOUTH 2 (TWO) TIMES DAILY. 180 tablet 2  . KLOR-CON M20 20 MEQ tablet TAKE 1 TABLET BY MOUTH TWICE DAILY 60 tablet 1  . KLOR-CON M20 20 MEQ tablet TAKE 1 TABLET (20 MEQ TOTAL) BY MOUTH DAILY. 90 tablet 0  . Multiple Vitamin (MULTIVITAMIN) tablet Take 1 tablet by mouth daily.      . ramipril (ALTACE) 5 MG capsule TAKE 1 CAPSULE (5 MG  TOTAL) BY MOUTH DAILY. 90 capsule 0  . warfarin (COUMADIN) 5 MG tablet TAKE 1 TABLET BY MOUTH DAILY OR AS DIRECTED BY COUMADIN CLINIC 90 tablet 1   No current facility-administered medications on file prior to visit.    Allergies  Allergen Reactions  . Procaine Hcl     Unknown reaction    Past Medical History  Diagnosis Date  . S/P CABG (coronary artery bypass graft) 2002    SVG to PDA, Free radial to Intermediate, LIMA to LAD by Dr Prescott Gum  . HTN (hypertension)   . Hypercholesteremia   . LV dysfunction     EF 35 to 40% per echo May 2012; EF remains 35 to 40% per echo Feb 2013. Referred for ICD  . Chronic anticoagulation     on coumadin  . 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 High risk medication use     on amiodarone  . Atrial fibrillation     ON AMIODARONE: PER VISIT NOTE IN [facility] FIRST DEGREE HB  . Pulmonary hypertension, moderate to severe   . Cataract immature     BILATERAL  . Osteoarthritis of right shoulder region     ACUTE PAIN  . Rotator cuff tear 2012  . Anemia     Referral  to GI Feb 2013  . Chronic kidney disease (CKD), stage II (mild)   . Myocardial infarction     Past Surgical History  Procedure Laterality Date  . Right leg surgeries      due to polio  . Left knee arthroscopy  2000  . Cardiac catheterization  2009    Grafts patent  . Cardiac catheterization  2012    Grafts patent. EF was 25 to 30% but 35 to 40 by echo  . Cardiac catheterization  2003  . Coronary artery bypass graft  2002    X3 VESSEL  . Cardioversion  2008--  FOR PAF    SUCCESSFUL  . Right shoulder rotator cuffe repair  05/16/11  . Cardioversion N/A 06/13/2013    Procedure: CARDIOVERSION;  Surgeon: Peter M Martinique, MD;  Location: [facility] ENDOSCOPY;  Service: Cardiovascular;  Laterality: N/A;    History  Smoking status  . Former Smoker  . Types: Cigarettes  . Quit date: 05/12/1979  Smokeless tobacco  . Never Used    Comment: stopped smoking over 30 years ago.    History   Alcohol Use  . 8.4 oz/week  . 14 Cans of beer per week    Comment: couple beers and a shot of crown royal most days    Family History  Problem Relation Age of Onset  . Valvular heart disease Father   . Colon cancer Neg Hx   . Colon polyps Neg Hx   . Rectal cancer Neg Hx   . Stomach cancer Neg Hx     Review of Systems: The review of systems is per the HPI.  All other systems were reviewed and are negative.  Physical Exam: BP 106/58 mmHg  Pulse 84  Ht 6' 3.5" (1.918 m)  Wt 98.748 kg (217 lb 11.2 oz)  BMI 26.84 kg/m2 Patient is very pleasant and in no acute distress. He is obese. Skin is warm and dry. Color is normal.  HEENT is unremarkable. Normocephalic/atraumatic. PERRL. Sclera are nonicteric. Neck is supple. No masses. No significant JVD.. Lungs are clear. Cardiac exam shows an irregular rhythm. His rate is controlled. Abdomen is soft without definite ascites. Extremities reveal no edema on the left. He has chronic musculoskeletal deformity of the right lower extremity. Gait and ROM are intact. No gross neurologic deficits noted.   LABORATORY DATA:  INR/Prothrombin Time: 2.3.   Assessment / Plan: 1. Chronic congestive heart failure secondary to systolic dysfunction. Ejection fraction of 35%. Clinically he appears to be well compensated.  He has not tolerated Aldactone or higher doses of ACE inhibitor and Coreg due to hypotension and renal insufficiency. Will continue his current therapy.  2. Coronary disease status post CABG in 2002.  3. Atrial fibrillation- recurrent. s/p DCCV on 06/13/13 with subsequent recurrence. He is asymptomatic and rate is well controlled off amiodarone.  Continue  coumadin.  4. Chronic kidney disease stage 3.   5. Anemia of chronic disease.  6. Hypertension.  7. Hyponatremia related to diuresis.  Will check lab work today including chemistries, CBC, and lipids. Follow up in 6 months.

## 2015-01-28 ENCOUNTER — Other Ambulatory Visit: Payer: Self-pay | Admitting: Pharmacist Clinician (PhC)/ Clinical Pharmacy Specialist

## 2015-01-28 ENCOUNTER — Telehealth: Payer: Self-pay

## 2015-01-28 MED ORDER — FUROSEMIDE 80 MG PO TABS: ORAL_TABLET | ORAL | Status: DC

## 2015-01-28 MED ORDER — WARFARIN SODIUM 5 MG PO TABS: ORAL_TABLET | ORAL | Status: DC

## 2015-01-28 MED ORDER — POTASSIUM CHLORIDE CRYS ER 20 MEQ PO TBCR: EXTENDED_RELEASE_TABLET | ORAL | Status: DC

## 2015-01-28 MED ORDER — CARVEDILOL 25 MG PO TABS: ORAL_TABLET | ORAL | Status: DC

## 2015-01-28 MED ORDER — RAMIPRIL 5 MG PO CAPS: ORAL_CAPSULE | ORAL | Status: DC

## 2015-01-28 MED ORDER — ATORVASTATIN CALCIUM 40 MG PO TABS: ORAL_TABLET | ORAL | Status: DC

## 2015-01-28 NOTE — Telephone Encounter (Signed)
Received call from patient requesting 90 day refills on medications.90 day refills sent to pharmacy.

## 2015-02-16 ENCOUNTER — Encounter: Payer: Self-pay | Admitting: Gastroenterology

## 2015-02-17 NOTE — Patient Outreach (Signed)
[facility]) Care Management  02/17/2015  Jason Choi [facility] 11-21-1940 [id number]   Referral from [place] 2 List, assigned Candie Mile, RN to outreach.  Thanks, Ronnell Freshwater. [place], [place] Assistant Phone: [phone] Fax: [phone]

## 2015-02-19 ENCOUNTER — Ambulatory Visit (INDEPENDENT_AMBULATORY_CARE_PROVIDER_SITE_OTHER): Payer: Medicare Other | Admitting: Pharmacist Clinician (PhC)/ Clinical Pharmacy Specialist

## 2015-02-19 DIAGNOSIS — Z7901 Long term (current) use of anticoagulants: Secondary | ICD-10-CM | POA: Diagnosis not present

## 2015-02-19 DIAGNOSIS — I481 Persistent atrial fibrillation: Secondary | ICD-10-CM | POA: Diagnosis not present

## 2015-02-19 DIAGNOSIS — I4891 Unspecified atrial fibrillation: Secondary | ICD-10-CM

## 2015-02-19 DIAGNOSIS — Z5181 Encounter for therapeutic drug level monitoring: Secondary | ICD-10-CM | POA: Diagnosis not present

## 2015-02-19 DIAGNOSIS — I4819 Other persistent atrial fibrillation: Secondary | ICD-10-CM

## 2015-02-19 LAB — POCT INR: INR: 2.2

## 2015-02-23 ENCOUNTER — Other Ambulatory Visit: Payer: Self-pay

## 2015-02-23 NOTE — Patient Outreach (Signed)
[facility]) Care Management  02/23/2015  Deric Bocock [facility] [birth year] [id number]  Unsuccessful screening call attempted.  Message left.  Will attempt a 2nd call within 1 week.  Candie Mile, RN, MSN [place] [phone] Fax [phone]

## 2015-03-03 ENCOUNTER — Other Ambulatory Visit: Payer: Self-pay

## 2015-03-03 NOTE — Patient Outreach (Signed)
[facility]) Care Management  03/03/2015  Jason Choi [facility] [birth year] [id number]  2nd attempt to reach patient referred from high risk list.  Call was answered by patient's wife.  HIPPA appropriate conversation with her.  She states Mr. Kruse works Monday through Thursday, but that he may be available on Fridays. Contact information left with her requesting patient call back.  Candie Mile, RN, MSN [place] [phone] Fax [phone]

## 2015-03-09 ENCOUNTER — Encounter: Payer: Self-pay | Admitting: Gastroenterology

## 2015-03-12 ENCOUNTER — Other Ambulatory Visit: Payer: Self-pay

## 2015-03-12 NOTE — Patient Outreach (Signed)
[facility]) Care Management  03/12/2015  [place] 07/22/1940 [id number]   Screening call completed today with patient referred from high risk list.  Explanation of [facility] services provided.  Patient states he is currently doing well, is working part time, and is independent for ADLs and IADLs.  His heart disease is currently well managed.  [facility] services declined for now, but he did request a brochure about [facility] for future reference.  RN Health Coach will send letter with brochure.  Candie Mile, RN, MSN [place] [phone] Fax [phone]

## 2015-03-15 NOTE — Patient Outreach (Signed)
[facility]) Care Management  03/15/2015  Jason Choi [facility] 09-17-1940 [id number]   Notification from Jason Mile, RN to close case due to patient refused [place] [facility] services.  Thanks, Jason Choi. [place], [place] Assistant Phone: [phone] Fax: [phone]

## 2015-04-02 ENCOUNTER — Ambulatory Visit (INDEPENDENT_AMBULATORY_CARE_PROVIDER_SITE_OTHER): Payer: Medicare Other | Admitting: Pharmacist Clinician (PhC)/ Clinical Pharmacy Specialist

## 2015-04-02 DIAGNOSIS — Z7901 Long term (current) use of anticoagulants: Secondary | ICD-10-CM

## 2015-04-02 DIAGNOSIS — Z5181 Encounter for therapeutic drug level monitoring: Secondary | ICD-10-CM | POA: Diagnosis not present

## 2015-04-02 DIAGNOSIS — I4819 Other persistent atrial fibrillation: Secondary | ICD-10-CM

## 2015-04-02 DIAGNOSIS — I481 Persistent atrial fibrillation: Secondary | ICD-10-CM | POA: Diagnosis not present

## 2015-04-02 DIAGNOSIS — I4891 Unspecified atrial fibrillation: Secondary | ICD-10-CM | POA: Diagnosis not present

## 2015-04-02 LAB — POCT INR: INR: 2

## 2015-05-10 ENCOUNTER — Ambulatory Visit: Payer: Medicare Other | Admitting: Gastroenterology

## 2015-05-14 ENCOUNTER — Ambulatory Visit (INDEPENDENT_AMBULATORY_CARE_PROVIDER_SITE_OTHER): Payer: Medicare Other | Admitting: Pharmacist Clinician (PhC)/ Clinical Pharmacy Specialist

## 2015-05-14 DIAGNOSIS — Z5181 Encounter for therapeutic drug level monitoring: Secondary | ICD-10-CM | POA: Diagnosis not present

## 2015-05-14 DIAGNOSIS — I4891 Unspecified atrial fibrillation: Secondary | ICD-10-CM | POA: Diagnosis not present

## 2015-05-14 DIAGNOSIS — I481 Persistent atrial fibrillation: Secondary | ICD-10-CM

## 2015-05-14 DIAGNOSIS — I4819 Other persistent atrial fibrillation: Secondary | ICD-10-CM

## 2015-05-14 DIAGNOSIS — Z7901 Long term (current) use of anticoagulants: Secondary | ICD-10-CM | POA: Diagnosis not present

## 2015-05-14 LAB — POCT INR: INR: 1.9

## 2015-05-15 ENCOUNTER — Other Ambulatory Visit: Payer: Self-pay | Admitting: Cardiology

## 2015-05-19 ENCOUNTER — Other Ambulatory Visit: Payer: Self-pay | Admitting: Cardiology

## 2015-05-26 ENCOUNTER — Encounter: Payer: Self-pay | Admitting: *Deleted

## 2015-06-19 ENCOUNTER — Other Ambulatory Visit: Payer: Self-pay | Admitting: Cardiology

## 2015-06-25 ENCOUNTER — Ambulatory Visit (INDEPENDENT_AMBULATORY_CARE_PROVIDER_SITE_OTHER): Payer: Medicare Other | Admitting: Pharmacist Clinician (PhC)/ Clinical Pharmacy Specialist

## 2015-06-25 DIAGNOSIS — Z7901 Long term (current) use of anticoagulants: Secondary | ICD-10-CM

## 2015-06-25 DIAGNOSIS — I4891 Unspecified atrial fibrillation: Secondary | ICD-10-CM

## 2015-06-25 DIAGNOSIS — I481 Persistent atrial fibrillation: Secondary | ICD-10-CM

## 2015-06-25 DIAGNOSIS — Z5181 Encounter for therapeutic drug level monitoring: Secondary | ICD-10-CM

## 2015-06-25 DIAGNOSIS — I4819 Other persistent atrial fibrillation: Secondary | ICD-10-CM

## 2015-06-25 LAB — POCT INR: INR: 1.9

## 2015-08-06 ENCOUNTER — Ambulatory Visit (INDEPENDENT_AMBULATORY_CARE_PROVIDER_SITE_OTHER): Payer: Medicare Other | Admitting: Pharmacist Clinician (PhC)/ Clinical Pharmacy Specialist

## 2015-08-06 DIAGNOSIS — I4891 Unspecified atrial fibrillation: Secondary | ICD-10-CM | POA: Diagnosis not present

## 2015-08-06 DIAGNOSIS — I4819 Other persistent atrial fibrillation: Secondary | ICD-10-CM

## 2015-08-06 DIAGNOSIS — I481 Persistent atrial fibrillation: Secondary | ICD-10-CM

## 2015-08-06 DIAGNOSIS — Z5181 Encounter for therapeutic drug level monitoring: Secondary | ICD-10-CM

## 2015-08-06 DIAGNOSIS — Z7901 Long term (current) use of anticoagulants: Secondary | ICD-10-CM

## 2015-08-06 LAB — POCT INR: INR: 1.7

## 2015-08-14 ENCOUNTER — Other Ambulatory Visit: Payer: Self-pay | Admitting: Cardiology

## 2015-08-16 NOTE — Telephone Encounter (Signed)
Rx request sent to pharmacy.  

## 2015-08-27 ENCOUNTER — Ambulatory Visit (INDEPENDENT_AMBULATORY_CARE_PROVIDER_SITE_OTHER): Payer: Medicare Other | Admitting: Pharmacist Clinician (PhC)/ Clinical Pharmacy Specialist

## 2015-08-27 DIAGNOSIS — I481 Persistent atrial fibrillation: Secondary | ICD-10-CM

## 2015-08-27 DIAGNOSIS — Z7901 Long term (current) use of anticoagulants: Secondary | ICD-10-CM | POA: Diagnosis not present

## 2015-08-27 DIAGNOSIS — I4819 Other persistent atrial fibrillation: Secondary | ICD-10-CM

## 2015-08-27 DIAGNOSIS — I4891 Unspecified atrial fibrillation: Secondary | ICD-10-CM

## 2015-08-27 DIAGNOSIS — Z5181 Encounter for therapeutic drug level monitoring: Secondary | ICD-10-CM

## 2015-08-27 LAB — POCT INR: INR: 1.9

## 2015-09-17 ENCOUNTER — Ambulatory Visit (INDEPENDENT_AMBULATORY_CARE_PROVIDER_SITE_OTHER): Payer: Medicare Other | Admitting: Pharmacist Clinician (PhC)/ Clinical Pharmacy Specialist

## 2015-09-17 DIAGNOSIS — I481 Persistent atrial fibrillation: Secondary | ICD-10-CM

## 2015-09-17 DIAGNOSIS — Z7901 Long term (current) use of anticoagulants: Secondary | ICD-10-CM

## 2015-09-17 DIAGNOSIS — I4891 Unspecified atrial fibrillation: Secondary | ICD-10-CM

## 2015-09-17 DIAGNOSIS — Z5181 Encounter for therapeutic drug level monitoring: Secondary | ICD-10-CM

## 2015-09-17 DIAGNOSIS — I4819 Other persistent atrial fibrillation: Secondary | ICD-10-CM

## 2015-09-17 LAB — POCT INR: INR: 1.8

## 2015-09-30 ENCOUNTER — Ambulatory Visit (INDEPENDENT_AMBULATORY_CARE_PROVIDER_SITE_OTHER): Payer: Medicare Other | Admitting: Pharmacist

## 2015-09-30 DIAGNOSIS — Z5181 Encounter for therapeutic drug level monitoring: Secondary | ICD-10-CM | POA: Diagnosis not present

## 2015-09-30 DIAGNOSIS — I481 Persistent atrial fibrillation: Secondary | ICD-10-CM | POA: Diagnosis not present

## 2015-09-30 DIAGNOSIS — I4891 Unspecified atrial fibrillation: Secondary | ICD-10-CM

## 2015-09-30 DIAGNOSIS — Z7901 Long term (current) use of anticoagulants: Secondary | ICD-10-CM

## 2015-09-30 DIAGNOSIS — I4819 Other persistent atrial fibrillation: Secondary | ICD-10-CM

## 2015-09-30 LAB — POCT INR: INR: 2.1

## 2015-10-22 ENCOUNTER — Ambulatory Visit (INDEPENDENT_AMBULATORY_CARE_PROVIDER_SITE_OTHER): Payer: Medicare Other | Admitting: Pharmacist Clinician (PhC)/ Clinical Pharmacy Specialist

## 2015-10-22 DIAGNOSIS — Z7901 Long term (current) use of anticoagulants: Secondary | ICD-10-CM | POA: Diagnosis not present

## 2015-10-22 DIAGNOSIS — Z5181 Encounter for therapeutic drug level monitoring: Secondary | ICD-10-CM | POA: Diagnosis not present

## 2015-10-22 DIAGNOSIS — I481 Persistent atrial fibrillation: Secondary | ICD-10-CM | POA: Diagnosis not present

## 2015-10-22 DIAGNOSIS — I4819 Other persistent atrial fibrillation: Secondary | ICD-10-CM

## 2015-10-22 DIAGNOSIS — I4891 Unspecified atrial fibrillation: Secondary | ICD-10-CM

## 2015-10-22 LAB — POCT INR: INR: 3.7

## 2015-10-27 ENCOUNTER — Telehealth: Payer: Self-pay | Admitting: Cardiology

## 2015-10-27 NOTE — Telephone Encounter (Signed)
Received a call from [place] from [place].Stated pt came in today for knee pain.Stated she wanted to let Dr.Jordan his B/P low 90/58 pulse 56.Advised Dr.Jordan out of office today.I will let him know 10/28/15 and call patient.

## 2015-11-03 NOTE — Telephone Encounter (Signed)
Patient called not at home.Advised to have pt call me tomorrow about his blood pressure at PCP last week.

## 2015-11-04 NOTE — Telephone Encounter (Signed)
F/u  Pt returning RN phone call- Please call back and discuss.  Can use mobile- [phone]

## 2015-11-05 NOTE — Telephone Encounter (Signed)
Returned call to patient.Dr.Jordan advised remain on same medications.Stated he is past due for follow up visit with Dr.Jordan.Dr.Jordan had a cancellation 6/13.Appointment scheduled 11/09/15 at 8:45 am.

## 2015-11-09 ENCOUNTER — Ambulatory Visit (INDEPENDENT_AMBULATORY_CARE_PROVIDER_SITE_OTHER): Payer: Medicare Other | Admitting: Cardiology

## 2015-11-09 ENCOUNTER — Encounter: Payer: Self-pay | Admitting: Cardiology

## 2015-11-09 VITALS — BP 88/50 | HR 68 | Ht 75.0 in | Wt 216.0 lb

## 2015-11-09 DIAGNOSIS — I2510 Atherosclerotic heart disease of native coronary artery without angina pectoris: Secondary | ICD-10-CM | POA: Diagnosis not present

## 2015-11-09 DIAGNOSIS — I482 Chronic atrial fibrillation, unspecified: Secondary | ICD-10-CM

## 2015-11-09 DIAGNOSIS — N183 Chronic kidney disease, stage 3 unspecified: Secondary | ICD-10-CM

## 2015-11-09 DIAGNOSIS — I5022 Chronic systolic (congestive) heart failure: Secondary | ICD-10-CM

## 2015-11-09 DIAGNOSIS — I10 Essential (primary) hypertension: Secondary | ICD-10-CM

## 2015-11-09 DIAGNOSIS — Z7901 Long term (current) use of anticoagulants: Secondary | ICD-10-CM | POA: Diagnosis not present

## 2015-11-09 MED ORDER — FUROSEMIDE 80 MG PO TABS: ORAL_TABLET | ORAL | Status: DC

## 2015-11-09 NOTE — Patient Instructions (Signed)
Reduce lasix to 80 mg in the morning and 40 mg in the evening  Continue your other therapy  If your blood pressure continues to run low let me know.

## 2015-11-09 NOTE — Progress Notes (Signed)
Jason Choi Date of Birth: [birth year] Medical Record [id number]  History of Present Illness: Jason Choi is seen today for a follow up of his CHF.   He is s/p CABG in 2002. He has a history of atrial fibrillation and was on chronic amiodarone and coumadin. His last cardioversion was in January 2015. He reverted to Afib and his amiodarone was discontinued with rate control strategy only.  He has pulmonary HTN and CKD. He has anemia of chronic disease.  His last echo in June 2014 showed an EF of 35 to 40% and given the severe biventricular enlargement, MRI demonstrated an ejection fraction of 34% with marked left ventricular enlargement and global hypokinesis. He has deferred defibrillator placement mainly due to to job restrictions.  On follow up today he denies any increased SOB or chest pain. Weight has been stable and he has no edema. He has been getting low BP readings lately associated with lightheadedness. His energy level is good and he is actively working. He reports he had "walking PNA" in May. He also had a gout attack last month.    Current Outpatient Prescriptions on File Prior to Visit  Medication Sig Dispense Refill  . allopurinol (ZYLOPRIM) 100 MG tablet Take 100 mg by mouth daily.    Marland Kitchen aspirin 81 MG tablet Take 81 mg by mouth daily.     Marland Kitchen atorvastatin (LIPITOR) 40 MG tablet TAKE 1 TABLET (40 MG TOTAL) BY MOUTH DAILY AT 6 PM. 90 tablet 3  . carvedilol (COREG) 25 MG tablet TAKE 0.5(HALF) TABLET (12.5 MG TOTAL) BY MOUTH 2 (TWO) TIMES DAILY WITH A MEAL. 90 tablet 3  . Multiple Vitamin (MULTIVITAMIN) tablet Take 1 tablet by mouth daily.      . potassium chloride SA (KLOR-CON M20) 20 MEQ tablet Take 1 tablet (20 mEq total) by mouth daily. Please schedule appointment for refills. 90 tablet 0  . ramipril (ALTACE) 5 MG capsule TAKE 1 CAPSULE (5 MG TOTAL) BY MOUTH DAILY. 90 capsule 3  . warfarin (COUMADIN) 5 MG tablet TAKE 1 TABLET BY MOUTH DAILY OR AS DIRECTED BY COUMADIN CLINIC 90 tablet 1    No current facility-administered medications on file prior to visit.    Allergies  Allergen Reactions  . Benzonatate Other (See Comments)    Made throat feel "rubbery"  . Lidocaine Other (See Comments)    Pass out  . Procaine Hcl     Past Medical History  Diagnosis Date  . S/P CABG (coronary artery bypass graft) 2002    SVG to PDA, Free radial to Intermediate, LIMA to LAD by Dr Prescott Gum  . HTN (hypertension)   . Hypercholesteremia   . LV dysfunction     EF 35 to 40% per echo May 2012; EF remains 35 to 40% per echo Feb 2013. Referred for ICD  . Chronic anticoagulation     on coumadin  . 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 High risk medication use     on amiodarone  . Atrial fibrillation ([place])     ON AMIODARONE: PER VISIT NOTE IN [facility] FIRST DEGREE HB  . Pulmonary hypertension, moderate to severe ([place])   . Cataract immature     BILATERAL  . Osteoarthritis of right shoulder region     ACUTE PAIN  . Rotator cuff tear 2012  . Anemia     Referral to GI Feb 2013  . Chronic kidney disease (CKD), stage II (mild)   . Myocardial infarction (  [facility])     Past Surgical History  Procedure Laterality Date  . Right leg surgeries      due to polio  . Left knee arthroscopy  2000  . Cardiac catheterization  2009    Grafts patent  . Cardiac catheterization  2012    Grafts patent. EF was 25 to 30% but 35 to 40 by echo  . Cardiac catheterization  2003  . Coronary artery bypass graft  2002    X3 VESSEL  . Cardioversion  2008--  FOR PAF    SUCCESSFUL  . Right shoulder rotator cuffe repair  05/16/11  . Cardioversion N/A 06/13/2013    Procedure: CARDIOVERSION;  Surgeon: Inri Sobieski M Martinique, MD;  Location: [facility] ENDOSCOPY;  Service: Cardiovascular;  Laterality: N/A;    History  Smoking status  . Former Smoker  . Types: Cigarettes  . Quit date: 05/12/1979  Smokeless tobacco  . Never Used    Comment: stopped smoking over 30 years ago.    History  Alcohol Use  . 8.4 oz/week  . 14  Cans of beer per week    Comment: couple beers and a shot of crown royal most days    Family History  Problem Relation Age of Onset  . Valvular heart disease Father   . Colon cancer Neg Hx   . Colon polyps Neg Hx   . Rectal cancer Neg Hx   . Stomach cancer Neg Hx     Review of Systems: The review of systems is per the HPI.  All other systems were reviewed and are negative.  Physical Exam: BP 88/50 mmHg  Pulse 68  Ht 6\' 3"  (1.905 m)  Wt 216 lb (97.977 kg)  BMI 27.00 kg/m2 Patient is very pleasant and in no acute distress. He is obese. Skin is warm and dry. Color is normal.  HEENT is unremarkable. Normocephalic/atraumatic. PERRL. Sclera are nonicteric. Neck is supple. No masses. No significant JVD.. Lungs are clear. Cardiac exam shows an irregular rhythm. His rate is controlled. No gallop. Soft systolic murmur. Abdomen is soft without definite ascites. Extremities reveal no edema on the left. He has chronic musculoskeletal deformity of the right lower extremity. Gait and ROM are intact. No gross neurologic deficits noted.   LABORATORY DATA:  10/11/15: BUN 57, creatinine 2.01. ALT 106. Uric acid 9.8.   Ecg today shows Afib with frequent PVCs. Rate 68. Low voltage. ST-T changes c/w lateral ischemia. I have personally reviewed and interpreted this study.  Assessment / Plan: 1. Chronic congestive heart failure secondary to systolic dysfunction. Ejection fraction of 35%. Clinically he appears to be well compensated.  He has not tolerated Aldactone or higher doses of ACE inhibitor and Coreg due to hypotension and renal insufficiency. With low blood pressure I have recommended reducing lasix to 80 mg in the am and 40 mg in the pm. If BP remains low will reduce Altace dose. I would like to continue current Coreg dose since this is controlling his Afib rate.   2. Coronary disease status post CABG in 2002.  3. Atrial fibrillation- recurrent. s/p DCCV on 06/13/13 with subsequent recurrence. He is  asymptomatic and rate is well controlled off amiodarone.  Continue  coumadin.  4. Chronic kidney disease stage 3.   5. Anemia of chronic disease.  6. Hypertension. Now low.  7. Hyponatremia resolved.   Follow up in 6 months.

## 2015-11-12 ENCOUNTER — Ambulatory Visit (INDEPENDENT_AMBULATORY_CARE_PROVIDER_SITE_OTHER): Payer: Medicare Other | Admitting: Pharmacist

## 2015-11-12 DIAGNOSIS — I482 Chronic atrial fibrillation, unspecified: Secondary | ICD-10-CM

## 2015-11-12 DIAGNOSIS — Z7901 Long term (current) use of anticoagulants: Secondary | ICD-10-CM

## 2015-11-12 DIAGNOSIS — Z5181 Encounter for therapeutic drug level monitoring: Secondary | ICD-10-CM | POA: Diagnosis not present

## 2015-11-12 DIAGNOSIS — I4891 Unspecified atrial fibrillation: Secondary | ICD-10-CM

## 2015-11-12 LAB — POCT INR: INR: 2

## 2015-12-22 ENCOUNTER — Telehealth: Payer: Self-pay | Admitting: *Deleted

## 2015-12-22 NOTE — Telephone Encounter (Signed)
Pt needs clearance for right elbow olecranon bursectomy and deep mass excision. They are also requesting clearance for pt to hold warfarin 5 days prior to the procedure. Will forward for dr jordan's review

## 2015-12-24 ENCOUNTER — Ambulatory Visit (INDEPENDENT_AMBULATORY_CARE_PROVIDER_SITE_OTHER): Payer: Medicare Other | Admitting: Pharmacist

## 2015-12-24 DIAGNOSIS — Z7901 Long term (current) use of anticoagulants: Secondary | ICD-10-CM

## 2015-12-24 DIAGNOSIS — Z5181 Encounter for therapeutic drug level monitoring: Secondary | ICD-10-CM

## 2015-12-24 DIAGNOSIS — I4891 Unspecified atrial fibrillation: Secondary | ICD-10-CM | POA: Diagnosis not present

## 2015-12-24 LAB — POCT INR: INR: 2.4

## 2015-12-26 NOTE — Telephone Encounter (Signed)
Jason Choi has a moderate risk for cardiovascular complications with general anesthesia but is currently stable. He may hold coumadin for 5 days prior to surgical procedure.  Johnattan Strassman Martinique MD, [facility]

## 2015-12-30 NOTE — Telephone Encounter (Signed)
Will fax this note to the number provided. 

## 2016-01-25 ENCOUNTER — Other Ambulatory Visit: Payer: Self-pay | Admitting: Cardiology

## 2016-01-25 NOTE — Telephone Encounter (Signed)
Rx request sent to pharmacy.  

## 2016-01-28 ENCOUNTER — Ambulatory Visit (INDEPENDENT_AMBULATORY_CARE_PROVIDER_SITE_OTHER): Payer: Medicare Other | Admitting: Pharmacist

## 2016-01-28 DIAGNOSIS — Z5181 Encounter for therapeutic drug level monitoring: Secondary | ICD-10-CM | POA: Diagnosis not present

## 2016-01-28 DIAGNOSIS — I4891 Unspecified atrial fibrillation: Secondary | ICD-10-CM | POA: Diagnosis not present

## 2016-01-28 DIAGNOSIS — Z7901 Long term (current) use of anticoagulants: Secondary | ICD-10-CM

## 2016-01-28 LAB — POCT INR: INR: 2

## 2016-02-23 ENCOUNTER — Other Ambulatory Visit: Payer: Self-pay | Admitting: Orthopedic Surgery

## 2016-02-24 ENCOUNTER — Other Ambulatory Visit: Payer: Self-pay | Admitting: Cardiology

## 2016-02-24 NOTE — Telephone Encounter (Signed)
Rx request sent to pharmacy.  

## 2016-03-10 ENCOUNTER — Ambulatory Visit (INDEPENDENT_AMBULATORY_CARE_PROVIDER_SITE_OTHER): Payer: Medicare Other | Admitting: Pharmacist Clinician (PhC)/ Clinical Pharmacy Specialist

## 2016-03-10 DIAGNOSIS — I4891 Unspecified atrial fibrillation: Secondary | ICD-10-CM | POA: Diagnosis not present

## 2016-03-10 DIAGNOSIS — Z5181 Encounter for therapeutic drug level monitoring: Secondary | ICD-10-CM | POA: Diagnosis not present

## 2016-03-10 DIAGNOSIS — Z7901 Long term (current) use of anticoagulants: Secondary | ICD-10-CM

## 2016-03-10 LAB — POCT INR: INR: 2.1

## 2016-04-03 ENCOUNTER — Other Ambulatory Visit: Payer: Self-pay | Admitting: Cardiology

## 2016-04-03 NOTE — Telephone Encounter (Signed)
Rx request sent to pharmacy.  

## 2016-04-19 ENCOUNTER — Ambulatory Visit (INDEPENDENT_AMBULATORY_CARE_PROVIDER_SITE_OTHER): Payer: Medicare Other | Admitting: Pharmacist Clinician (PhC)/ Clinical Pharmacy Specialist

## 2016-04-19 DIAGNOSIS — Z7901 Long term (current) use of anticoagulants: Secondary | ICD-10-CM | POA: Diagnosis not present

## 2016-04-19 DIAGNOSIS — Z5181 Encounter for therapeutic drug level monitoring: Secondary | ICD-10-CM | POA: Diagnosis not present

## 2016-04-19 DIAGNOSIS — I2510 Atherosclerotic heart disease of native coronary artery without angina pectoris: Secondary | ICD-10-CM | POA: Diagnosis not present

## 2016-04-19 DIAGNOSIS — I4891 Unspecified atrial fibrillation: Secondary | ICD-10-CM

## 2016-04-19 LAB — POCT INR: INR: 2.3

## 2016-06-02 ENCOUNTER — Ambulatory Visit (INDEPENDENT_AMBULATORY_CARE_PROVIDER_SITE_OTHER): Payer: Medicare Other | Admitting: Pharmacist

## 2016-06-02 DIAGNOSIS — Z7901 Long term (current) use of anticoagulants: Secondary | ICD-10-CM

## 2016-06-02 DIAGNOSIS — Z5181 Encounter for therapeutic drug level monitoring: Secondary | ICD-10-CM

## 2016-06-02 DIAGNOSIS — I4891 Unspecified atrial fibrillation: Secondary | ICD-10-CM

## 2016-06-02 LAB — POCT INR: INR: 2.7

## 2016-06-12 ENCOUNTER — Encounter (HOSPITAL_BASED_OUTPATIENT_CLINIC_OR_DEPARTMENT_OTHER): Payer: Medicare Other | Attending: Internal Medicine

## 2016-06-12 ENCOUNTER — Other Ambulatory Visit: Payer: Self-pay | Admitting: Internal Medicine

## 2016-06-12 ENCOUNTER — Ambulatory Visit
Admission: RE | Admit: 2016-06-12 | Discharge: 2016-06-12 | Disposition: A | Discharge status: Home / Self Care | Payer: Medicare Other | Source: Ambulatory Visit | Attending: Internal Medicine | Admitting: Internal Medicine

## 2016-06-12 DIAGNOSIS — I509 Heart failure, unspecified: Secondary | ICD-10-CM | POA: Insufficient documentation

## 2016-06-12 DIAGNOSIS — L89513 Pressure ulcer of right ankle, stage 3: Secondary | ICD-10-CM | POA: Insufficient documentation

## 2016-06-12 DIAGNOSIS — Z8612 Personal history of poliomyelitis: Secondary | ICD-10-CM | POA: Diagnosis not present

## 2016-06-12 DIAGNOSIS — M85871 Other specified disorders of bone density and structure, right ankle and foot: Secondary | ICD-10-CM | POA: Insufficient documentation

## 2016-06-12 DIAGNOSIS — I252 Old myocardial infarction: Secondary | ICD-10-CM | POA: Diagnosis not present

## 2016-06-12 DIAGNOSIS — Z951 Presence of aortocoronary bypass graft: Secondary | ICD-10-CM | POA: Diagnosis not present

## 2016-06-12 DIAGNOSIS — N182 Chronic kidney disease, stage 2 (mild): Secondary | ICD-10-CM | POA: Insufficient documentation

## 2016-06-12 DIAGNOSIS — Z87891 Personal history of nicotine dependence: Secondary | ICD-10-CM | POA: Diagnosis not present

## 2016-06-12 DIAGNOSIS — I130 Hypertensive heart and chronic kidney disease with heart failure and stage 1 through stage 4 chronic kidney disease, or unspecified chronic kidney disease: Secondary | ICD-10-CM | POA: Insufficient documentation

## 2016-06-12 DIAGNOSIS — Z7901 Long term (current) use of anticoagulants: Secondary | ICD-10-CM | POA: Diagnosis not present

## 2016-06-12 DIAGNOSIS — L97919 Non-pressure chronic ulcer of unspecified part of right lower leg with unspecified severity: Secondary | ICD-10-CM

## 2016-06-12 DIAGNOSIS — I4891 Unspecified atrial fibrillation: Secondary | ICD-10-CM | POA: Diagnosis not present

## 2016-06-19 DIAGNOSIS — L89513 Pressure ulcer of right ankle, stage 3: Secondary | ICD-10-CM | POA: Diagnosis not present

## 2016-06-26 DIAGNOSIS — L89513 Pressure ulcer of right ankle, stage 3: Secondary | ICD-10-CM | POA: Diagnosis not present

## 2016-07-03 ENCOUNTER — Other Ambulatory Visit: Payer: Self-pay | Admitting: Internal Medicine

## 2016-07-03 ENCOUNTER — Encounter (HOSPITAL_BASED_OUTPATIENT_CLINIC_OR_DEPARTMENT_OTHER): Payer: Medicare Other | Attending: Internal Medicine

## 2016-07-03 DIAGNOSIS — I10 Essential (primary) hypertension: Secondary | ICD-10-CM | POA: Insufficient documentation

## 2016-07-03 DIAGNOSIS — L89893 Pressure ulcer of other site, stage 3: Secondary | ICD-10-CM | POA: Insufficient documentation

## 2016-07-03 DIAGNOSIS — I739 Peripheral vascular disease, unspecified: Secondary | ICD-10-CM

## 2016-07-03 DIAGNOSIS — Z8612 Personal history of poliomyelitis: Secondary | ICD-10-CM | POA: Diagnosis not present

## 2016-07-03 DIAGNOSIS — I252 Old myocardial infarction: Secondary | ICD-10-CM | POA: Insufficient documentation

## 2016-07-05 ENCOUNTER — Ambulatory Visit (HOSPITAL_COMMUNITY)
Admission: RE | Admit: 2016-07-05 | Discharge: 2016-07-05 | Disposition: A | Discharge status: Home / Self Care | Payer: Medicare Other | Source: Ambulatory Visit | Attending: Internal Medicine | Admitting: Internal Medicine

## 2016-07-05 ENCOUNTER — Other Ambulatory Visit: Payer: Self-pay | Admitting: Internal Medicine

## 2016-07-05 DIAGNOSIS — I739 Peripheral vascular disease, unspecified: Secondary | ICD-10-CM | POA: Insufficient documentation

## 2016-07-05 DIAGNOSIS — L98499 Non-pressure chronic ulcer of skin of other sites with unspecified severity: Secondary | ICD-10-CM

## 2016-07-05 DIAGNOSIS — L97309 Non-pressure chronic ulcer of unspecified ankle with unspecified severity: Secondary | ICD-10-CM

## 2016-07-10 DIAGNOSIS — L89893 Pressure ulcer of other site, stage 3: Secondary | ICD-10-CM | POA: Diagnosis not present

## 2016-07-14 ENCOUNTER — Ambulatory Visit (INDEPENDENT_AMBULATORY_CARE_PROVIDER_SITE_OTHER): Payer: Medicare Other | Admitting: Pharmacist Clinician (PhC)/ Clinical Pharmacy Specialist

## 2016-07-14 DIAGNOSIS — I4891 Unspecified atrial fibrillation: Secondary | ICD-10-CM

## 2016-07-14 DIAGNOSIS — Z5181 Encounter for therapeutic drug level monitoring: Secondary | ICD-10-CM

## 2016-07-14 DIAGNOSIS — Z7901 Long term (current) use of anticoagulants: Secondary | ICD-10-CM | POA: Diagnosis not present

## 2016-07-14 LAB — POCT INR: INR: 1.9

## 2016-07-17 DIAGNOSIS — L89893 Pressure ulcer of other site, stage 3: Secondary | ICD-10-CM | POA: Diagnosis not present

## 2016-07-22 ENCOUNTER — Other Ambulatory Visit: Payer: Self-pay | Admitting: Cardiology

## 2016-07-24 ENCOUNTER — Emergency Department (HOSPITAL_COMMUNITY)
Admission: EM | Admit: 2016-07-24 | Discharge: 2016-07-24 | Disposition: A | Discharge status: Home / Self Care | Payer: Medicare Other | Source: Home / Self Care | Attending: Emergency Medicine | Admitting: Emergency Medicine

## 2016-07-24 ENCOUNTER — Encounter (HOSPITAL_COMMUNITY): Payer: Self-pay

## 2016-07-24 ENCOUNTER — Telehealth: Payer: Self-pay | Admitting: Cardiology

## 2016-07-24 ENCOUNTER — Emergency Department (HOSPITAL_COMMUNITY): Payer: Medicare Other

## 2016-07-24 DIAGNOSIS — Z87891 Personal history of nicotine dependence: Secondary | ICD-10-CM

## 2016-07-24 DIAGNOSIS — R600 Localized edema: Secondary | ICD-10-CM | POA: Insufficient documentation

## 2016-07-24 DIAGNOSIS — N189 Chronic kidney disease, unspecified: Secondary | ICD-10-CM

## 2016-07-24 DIAGNOSIS — Z951 Presence of aortocoronary bypass graft: Secondary | ICD-10-CM | POA: Insufficient documentation

## 2016-07-24 DIAGNOSIS — Z7982 Long term (current) use of aspirin: Secondary | ICD-10-CM | POA: Insufficient documentation

## 2016-07-24 DIAGNOSIS — I252 Old myocardial infarction: Secondary | ICD-10-CM

## 2016-07-24 DIAGNOSIS — Z8679 Personal history of other diseases of the circulatory system: Secondary | ICD-10-CM

## 2016-07-24 DIAGNOSIS — I2510 Atherosclerotic heart disease of native coronary artery without angina pectoris: Secondary | ICD-10-CM

## 2016-07-24 DIAGNOSIS — I951 Orthostatic hypotension: Secondary | ICD-10-CM | POA: Insufficient documentation

## 2016-07-24 DIAGNOSIS — I5022 Chronic systolic (congestive) heart failure: Secondary | ICD-10-CM | POA: Insufficient documentation

## 2016-07-24 DIAGNOSIS — R609 Edema, unspecified: Secondary | ICD-10-CM

## 2016-07-24 LAB — CBC
HCT: 34.3 % — ABNORMAL LOW (ref 39.0–52.0)
Hemoglobin: 11.2 g/dL — ABNORMAL LOW (ref 13.0–17.0)
MCH: 31.5 pg (ref 26.0–34.0)
MCHC: 32.7 g/dL (ref 30.0–36.0)
MCV: 96.3 fL (ref 78.0–100.0)
Platelets: 131 10*3/uL — ABNORMAL LOW (ref 150–400)
RBC: 3.56 MIL/uL — ABNORMAL LOW (ref 4.22–5.81)
RDW: 17.1 % — ABNORMAL HIGH (ref 11.5–15.5)
WBC: 5.6 10*3/uL (ref 4.0–10.5)

## 2016-07-24 LAB — BASIC METABOLIC PANEL
Anion gap: 8 (ref 5–15)
BUN: 50 mg/dL — ABNORMAL HIGH (ref 6–20)
CO2: 26 mmol/L (ref 22–32)
Calcium: 8.9 mg/dL (ref 8.9–10.3)
Chloride: 104 mmol/L (ref 101–111)
Creatinine, Ser: 1.59 mg/dL — ABNORMAL HIGH (ref 0.61–1.24)
GFR calc Af Amer: 48 mL/min — ABNORMAL LOW (ref >60)
GFR calc non Af Amer: 41 mL/min — ABNORMAL LOW (ref >60)
Glucose, Bld: 92 mg/dL (ref 65–99)
Potassium: 4.6 mmol/L (ref 3.5–5.1)
Sodium: 138 mmol/L (ref 135–145)

## 2016-07-24 LAB — PROTIME-INR
INR: 3.12
Prothrombin Time: 32.8 seconds — ABNORMAL HIGH (ref 11.4–15.2)

## 2016-07-24 LAB — TROPONIN I: Troponin I: 0.05 ng/mL (ref <0.03)

## 2016-07-24 LAB — I-STAT TROPONIN, ED: TROPONIN I, POC: 0.05 ng/mL (ref 0.00–0.08)

## 2016-07-24 MED ORDER — BUMETANIDE 2 MG PO TABS: 2.0000 mg | ORAL_TABLET | Freq: Two times a day (BID) | ORAL | 0 refills | Status: DC
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
## 2016-07-24 NOTE — Telephone Encounter (Signed)
New message      Pt c/o BP issue: STAT if pt c/o blurred vision, one-sided weakness or slurred speech  1. What are your last 5 BP readings? 9am today 84/53 2. Are you having any other symptoms (ex. Dizziness, headache, blurred vision, passed out)? Pt states that he is retaining fluid from the waist down 3. What is your BP issue? Pt states this am his bp was really low and he is retaining fluid.  Please advise

## 2016-07-24 NOTE — Telephone Encounter (Signed)
Agree needs ER evaluation.  Darion Juhasz Martinique MD, [facility]

## 2016-07-24 NOTE — Telephone Encounter (Signed)
Patient went to ED for evaluation.
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
## 2016-07-24 NOTE — Discharge Instructions (Signed)
Stop Coreg.  Stop Lasix.  Take Bumex instead of Lasix.
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
## 2016-07-26 ENCOUNTER — Ambulatory Visit (INDEPENDENT_AMBULATORY_CARE_PROVIDER_SITE_OTHER): Payer: Medicare Other | Admitting: Cardiology

## 2016-07-26 ENCOUNTER — Encounter: Payer: Self-pay | Admitting: Cardiology

## 2016-07-26 VITALS — BP 88/50 | HR 87 | Ht 75.5 in | Wt 226.6 lb

## 2016-07-26 DIAGNOSIS — I2510 Atherosclerotic heart disease of native coronary artery without angina pectoris: Secondary | ICD-10-CM

## 2016-07-26 DIAGNOSIS — I482 Chronic atrial fibrillation: Secondary | ICD-10-CM

## 2016-07-26 DIAGNOSIS — I5023 Acute on chronic systolic (congestive) heart failure: Secondary | ICD-10-CM

## 2016-07-26 DIAGNOSIS — I10 Essential (primary) hypertension: Secondary | ICD-10-CM

## 2016-07-26 DIAGNOSIS — I4821 Permanent atrial fibrillation: Secondary | ICD-10-CM

## 2016-07-26 DIAGNOSIS — I5022 Chronic systolic (congestive) heart failure: Secondary | ICD-10-CM | POA: Diagnosis not present

## 2016-07-26 DIAGNOSIS — R55 Syncope and collapse: Secondary | ICD-10-CM | POA: Diagnosis not present
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
## 2016-07-26 NOTE — Patient Instructions (Signed)
Stop Altace  Do not take coumadin tonight.  Hold Coreg.   Continue lasix 80 mg twice a day.  We will arrange for you to be admitted to our Heart failure service tomorrow.

## 2016-07-27 ENCOUNTER — Telehealth: Payer: Self-pay | Admitting: Cardiology

## 2016-07-27 ENCOUNTER — Inpatient Hospital Stay (HOSPITAL_COMMUNITY): Payer: Medicare Other

## 2016-07-27 ENCOUNTER — Inpatient Hospital Stay (HOSPITAL_COMMUNITY)
Admission: AD | Admit: 2016-07-27 | Discharge: 2016-08-10 | DRG: 286 | Disposition: A | Discharge status: Home / Self Care | Payer: Medicare Other | Source: Ambulatory Visit | Attending: Cardiology | Admitting: Cardiology

## 2016-07-27 DIAGNOSIS — Z87891 Personal history of nicotine dependence: Secondary | ICD-10-CM

## 2016-07-27 DIAGNOSIS — I482 Chronic atrial fibrillation: Secondary | ICD-10-CM | POA: Diagnosis not present

## 2016-07-27 DIAGNOSIS — I447 Left bundle-branch block, unspecified: Secondary | ICD-10-CM | POA: Diagnosis present

## 2016-07-27 DIAGNOSIS — I5043 Acute on chronic combined systolic (congestive) and diastolic (congestive) heart failure: Secondary | ICD-10-CM

## 2016-07-27 DIAGNOSIS — I2510 Atherosclerotic heart disease of native coronary artery without angina pectoris: Secondary | ICD-10-CM | POA: Diagnosis present

## 2016-07-27 DIAGNOSIS — R042 Hemoptysis: Secondary | ICD-10-CM | POA: Diagnosis not present

## 2016-07-27 DIAGNOSIS — N183 Chronic kidney disease, stage 3 (moderate): Secondary | ICD-10-CM | POA: Diagnosis present

## 2016-07-27 DIAGNOSIS — L97519 Non-pressure chronic ulcer of other part of right foot with unspecified severity: Secondary | ICD-10-CM | POA: Diagnosis present

## 2016-07-27 DIAGNOSIS — Z7901 Long term (current) use of anticoagulants: Secondary | ICD-10-CM | POA: Diagnosis not present

## 2016-07-27 DIAGNOSIS — L0390 Cellulitis, unspecified: Secondary | ICD-10-CM | POA: Diagnosis present

## 2016-07-27 DIAGNOSIS — Z8612 Personal history of poliomyelitis: Secondary | ICD-10-CM | POA: Diagnosis not present

## 2016-07-27 DIAGNOSIS — Z79899 Other long term (current) drug therapy: Secondary | ICD-10-CM | POA: Diagnosis not present

## 2016-07-27 DIAGNOSIS — D649 Anemia, unspecified: Secondary | ICD-10-CM | POA: Diagnosis present

## 2016-07-27 DIAGNOSIS — I130 Hypertensive heart and chronic kidney disease with heart failure and stage 1 through stage 4 chronic kidney disease, or unspecified chronic kidney disease: Principal | ICD-10-CM | POA: Diagnosis present

## 2016-07-27 DIAGNOSIS — M1A9XX1 Chronic gout, unspecified, with tophus (tophi): Secondary | ICD-10-CM | POA: Diagnosis present

## 2016-07-27 DIAGNOSIS — R600 Localized edema: Secondary | ICD-10-CM | POA: Diagnosis present

## 2016-07-27 DIAGNOSIS — L02619 Cutaneous abscess of unspecified foot: Secondary | ICD-10-CM | POA: Diagnosis not present

## 2016-07-27 DIAGNOSIS — M869 Osteomyelitis, unspecified: Secondary | ICD-10-CM

## 2016-07-27 DIAGNOSIS — R040 Epistaxis: Secondary | ICD-10-CM | POA: Diagnosis not present

## 2016-07-27 DIAGNOSIS — M86071 Acute hematogenous osteomyelitis, right ankle and foot: Secondary | ICD-10-CM | POA: Diagnosis not present

## 2016-07-27 DIAGNOSIS — Z7982 Long term (current) use of aspirin: Secondary | ICD-10-CM

## 2016-07-27 DIAGNOSIS — N179 Acute kidney failure, unspecified: Secondary | ICD-10-CM | POA: Diagnosis not present

## 2016-07-27 DIAGNOSIS — I5023 Acute on chronic systolic (congestive) heart failure: Secondary | ICD-10-CM | POA: Diagnosis not present

## 2016-07-27 DIAGNOSIS — I5021 Acute systolic (congestive) heart failure: Secondary | ICD-10-CM | POA: Diagnosis not present

## 2016-07-27 DIAGNOSIS — I5020 Unspecified systolic (congestive) heart failure: Secondary | ICD-10-CM

## 2016-07-27 DIAGNOSIS — I472 Ventricular tachycardia: Secondary | ICD-10-CM | POA: Diagnosis present

## 2016-07-27 DIAGNOSIS — L03119 Cellulitis of unspecified part of limb: Secondary | ICD-10-CM | POA: Diagnosis not present

## 2016-07-27 DIAGNOSIS — I252 Old myocardial infarction: Secondary | ICD-10-CM

## 2016-07-27 DIAGNOSIS — I255 Ischemic cardiomyopathy: Secondary | ICD-10-CM | POA: Diagnosis present

## 2016-07-27 DIAGNOSIS — I272 Pulmonary hypertension, unspecified: Secondary | ICD-10-CM | POA: Diagnosis present

## 2016-07-27 DIAGNOSIS — Z8249 Family history of ischemic heart disease and other diseases of the circulatory system: Secondary | ICD-10-CM | POA: Diagnosis not present

## 2016-07-27 DIAGNOSIS — M19011 Primary osteoarthritis, right shoulder: Secondary | ICD-10-CM | POA: Diagnosis present

## 2016-07-27 DIAGNOSIS — Z951 Presence of aortocoronary bypass graft: Secondary | ICD-10-CM | POA: Diagnosis not present

## 2016-07-27 DIAGNOSIS — M1000 Idiopathic gout, unspecified site: Secondary | ICD-10-CM | POA: Diagnosis not present

## 2016-07-27 DIAGNOSIS — E785 Hyperlipidemia, unspecified: Secondary | ICD-10-CM | POA: Diagnosis present

## 2016-07-27 DIAGNOSIS — R55 Syncope and collapse: Secondary | ICD-10-CM | POA: Diagnosis present

## 2016-07-27 DIAGNOSIS — E871 Hypo-osmolality and hyponatremia: Secondary | ICD-10-CM | POA: Diagnosis not present

## 2016-07-27 DIAGNOSIS — M8600 Acute hematogenous osteomyelitis, unspecified site: Secondary | ICD-10-CM | POA: Diagnosis not present

## 2016-07-27 DIAGNOSIS — Z23 Encounter for immunization: Secondary | ICD-10-CM | POA: Diagnosis present

## 2016-07-27 DIAGNOSIS — R570 Cardiogenic shock: Secondary | ICD-10-CM | POA: Diagnosis not present

## 2016-07-27 DIAGNOSIS — M868X7 Other osteomyelitis, ankle and foot: Secondary | ICD-10-CM | POA: Diagnosis not present

## 2016-07-27 DIAGNOSIS — I951 Orthostatic hypotension: Secondary | ICD-10-CM | POA: Diagnosis present

## 2016-07-27 DIAGNOSIS — I509 Heart failure, unspecified: Secondary | ICD-10-CM | POA: Diagnosis not present

## 2016-07-27 DIAGNOSIS — E7800 Pure hypercholesterolemia, unspecified: Secondary | ICD-10-CM | POA: Diagnosis present

## 2016-07-27 LAB — CBC WITH DIFFERENTIAL/PLATELET
BASOS ABS: 0.1 10*3/uL (ref 0.0–0.1)
BASOS PCT: 1 %
EOS PCT: 10 %
Eosinophils Absolute: 0.6 10*3/uL (ref 0.0–0.7)
HCT: 36 % — ABNORMAL LOW (ref 39.0–52.0)
Hemoglobin: 11.3 g/dL — ABNORMAL LOW (ref 13.0–17.0)
LYMPHS PCT: 18 %
Lymphs Abs: 1.1 10*3/uL (ref 0.7–4.0)
MCH: 31 pg (ref 26.0–34.0)
MCHC: 31.4 g/dL (ref 30.0–36.0)
MCV: 98.6 fL (ref 78.0–100.0)
Monocytes Absolute: 0.9 10*3/uL (ref 0.1–1.0)
Monocytes Relative: 14 %
Neutro Abs: 3.5 10*3/uL (ref 1.7–7.7)
Neutrophils Relative %: 57 %
PLATELETS: 187 10*3/uL (ref 150–400)
RBC: 3.65 MIL/uL — AB (ref 4.22–5.81)
RDW: 17.2 % — AB (ref 11.5–15.5)
WBC: 6.1 10*3/uL (ref 4.0–10.5)

## 2016-07-27 LAB — COMPREHENSIVE METABOLIC PANEL
ALBUMIN: 3.5 g/dL (ref 3.5–5.0)
ALT: 23 U/L (ref 17–63)
AST: 34 U/L (ref 15–41)
Alkaline Phosphatase: 108 U/L (ref 38–126)
Anion gap: 13 (ref 5–15)
BUN: 52 mg/dL — AB (ref 6–20)
CHLORIDE: 101 mmol/L (ref 101–111)
CO2: 25 mmol/L (ref 22–32)
CREATININE: 1.97 mg/dL — AB (ref 0.61–1.24)
Calcium: 9.3 mg/dL (ref 8.9–10.3)
GFR calc Af Amer: 37 mL/min — ABNORMAL LOW (ref >60)
GFR, EST NON AFRICAN AMERICAN: 32 mL/min — AB (ref >60)
GLUCOSE: 150 mg/dL — AB (ref 65–99)
POTASSIUM: 4.6 mmol/L (ref 3.5–5.1)
SODIUM: 139 mmol/L (ref 135–145)
Total Bilirubin: 1.2 mg/dL (ref 0.3–1.2)
Total Protein: 6 g/dL — ABNORMAL LOW (ref 6.5–8.1)

## 2016-07-27 LAB — MRSA PCR SCREENING: MRSA BY PCR: NEGATIVE

## 2016-07-27 LAB — PROTIME-INR
INR: 2.5
Prothrombin Time: 27.5 seconds — ABNORMAL HIGH (ref 11.4–15.2)

## 2016-07-27 LAB — TSH: TSH: 2.28 u[IU]/mL (ref 0.350–4.500)

## 2016-07-27 LAB — BRAIN NATRIURETIC PEPTIDE: B NATRIURETIC PEPTIDE 5: 3377.1 pg/mL — AB (ref 0.0–100.0)

## 2016-07-27 LAB — MAGNESIUM: Magnesium: 2.3 mg/dL (ref 1.7–2.4)

## 2016-07-27 MED ORDER — SODIUM CHLORIDE 0.9% FLUSH: 10.0000 mL | INTRAVENOUS | Status: DC | PRN

## 2016-07-27 MED ORDER — SODIUM CHLORIDE 0.9% FLUSH
10.0000 mL | Freq: Two times a day (BID) | INTRAVENOUS | Status: DC
  Administered 2016-07-27 – 2016-07-29 (×4): 10 mL
  Administered 2016-07-29: 30 mL
  Administered 2016-08-01 – 2016-08-10 (×5): 10 mL

## 2016-07-27 MED ORDER — ACETAMINOPHEN 325 MG PO TABS
650.0000 mg | ORAL_TABLET | ORAL | Status: DC | PRN
  Administered 2016-08-02 – 2016-08-04 (×3): 650 mg via ORAL
  Filled 2016-07-27 (×3): qty 2

## 2016-07-27 MED ORDER — TRIAMCINOLONE ACETONIDE 0.5 % EX OINT
1.0000 "application " | TOPICAL_OINTMENT | Freq: Every day | CUTANEOUS | Status: DC
  Administered 2016-07-27 – 2016-08-09 (×11): 1 via TOPICAL
  Filled 2016-07-27 (×3): qty 15

## 2016-07-27 MED ORDER — ALLOPURINOL 100 MG PO TABS
100.0000 mg | ORAL_TABLET | Freq: Every day | ORAL | Status: DC
  Administered 2016-07-28 – 2016-08-07 (×11): 100 mg via ORAL
  Filled 2016-07-27 (×11): qty 1

## 2016-07-27 MED ORDER — ADULT MULTIVITAMIN W/MINERALS CH
1.0000 | ORAL_TABLET | Freq: Every day | ORAL | Status: DC
  Administered 2016-07-28 – 2016-08-10 (×14): 1 via ORAL
  Filled 2016-07-27 (×14): qty 1

## 2016-07-27 MED ORDER — SODIUM CHLORIDE 0.9% FLUSH: 3.0000 mL | INTRAVENOUS | Status: DC | PRN

## 2016-07-27 MED ORDER — ASPIRIN EC 81 MG PO TBEC
81.0000 mg | DELAYED_RELEASE_TABLET | Freq: Every day | ORAL | Status: DC
  Administered 2016-07-27 – 2016-08-07 (×12): 81 mg via ORAL
  Filled 2016-07-27 (×12): qty 1

## 2016-07-27 MED ORDER — LISINOPRIL 2.5 MG PO TABS
2.5000 mg | ORAL_TABLET | Freq: Every day | ORAL | Status: DC
  Administered 2016-07-28 – 2016-07-30 (×3): 2.5 mg via ORAL
  Filled 2016-07-27 (×4): qty 1

## 2016-07-27 MED ORDER — SODIUM CHLORIDE 0.9% FLUSH
3.0000 mL | Freq: Two times a day (BID) | INTRAVENOUS | Status: DC
Start: 2016-07-27 — End: 2016-08-10
  Administered 2016-07-28 – 2016-08-10 (×13): 3 mL via INTRAVENOUS

## 2016-07-27 MED ORDER — ONDANSETRON HCL 4 MG/2ML IJ SOLN: 4.0000 mg | Freq: Four times a day (QID) | INTRAMUSCULAR | Status: DC | PRN

## 2016-07-27 MED ORDER — FUROSEMIDE 10 MG/ML IJ SOLN
80.0000 mg | Freq: Two times a day (BID) | INTRAMUSCULAR | Status: DC
  Administered 2016-07-27 – 2016-07-28 (×2): 80 mg via INTRAVENOUS
  Filled 2016-07-27 (×2): qty 8

## 2016-07-27 MED ORDER — DIGOXIN 125 MCG PO TABS
0.1250 mg | ORAL_TABLET | Freq: Every day | ORAL | Status: DC
  Administered 2016-07-27 – 2016-08-06 (×11): 0.125 mg via ORAL
  Filled 2016-07-27 (×11): qty 1

## 2016-07-27 MED ORDER — SODIUM CHLORIDE 0.9 % IV SOLN: 250.0000 mL | INTRAVENOUS | Status: DC | PRN

## 2016-07-27 MED ORDER — ATORVASTATIN CALCIUM 40 MG PO TABS
40.0000 mg | ORAL_TABLET | Freq: Every day | ORAL | Status: DC
  Administered 2016-07-27 – 2016-08-09 (×13): 40 mg via ORAL
  Filled 2016-07-27 (×14): qty 1

## 2016-07-27 MED ORDER — HEPARIN (PORCINE) IN NACL 100-0.45 UNIT/ML-% IJ SOLN
1200.0000 [IU]/h | INTRAMUSCULAR | Status: DC
  Administered 2016-07-27: 1200 [IU]/h via INTRAVENOUS
  Filled 2016-07-27: qty 250

## 2016-07-27 NOTE — Progress Notes (Signed)
ANTICOAGULATION CONSULT NOTE - Initial Consult  Pharmacy Consult for heparin Indication: bridge therapy while coumadin on hold for procedure  Allergies  Allergen Reactions  . Lidocaine Other (See Comments)    Pass out  . Procaine Hcl     Patient Measurements:   Heparin Dosing Weight: 99.7 kg  Vital Signs: Temp: 97.3 F (36.3 C) (03/01 1600) Temp Source: Other (Comment) (03/01 1600)  Labs:  Recent Labs  07/24/16 1647  LABPROT 32.8*  INR 3.12  TROPONINI 0.05*    Estimated Creatinine Clearance: 50.2 mL/min (by C-G formula based on SCr of 1.59 mg/dL (H)).   Medical History: Past Medical History:  Diagnosis Date  . Anemia    Referral to GI Feb 2013  . Atrial fibrillation ([place])    ON AMIODARONE: PER VISIT NOTE IN SINUS FIRST DEGREE HB  . Cataract immature    BILATERAL  . Chronic anticoagulation    on coumadin  . Chronic kidney disease (CKD), stage II (mild)   . High risk medication use    on amiodarone  . HTN (hypertension)   . Hypercholesteremia   . LV dysfunction    EF 35 to 40% per echo May 2012; EF remains 35 to 40% per echo Feb 2013. Referred for ICD  . Myocardial infarction   . Osteoarthritis of right shoulder region    ACUTE PAIN  . 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 Pulmonary hypertension, moderate to severe   . Rotator cuff tear 2012  . S/P CABG (coronary artery bypass graft) 2002   SVG to PDA, Free radial to Intermediate, LIMA to LAD by Dr Prescott Gum    Medications:  Prescriptions Prior to Admission  Medication Sig Dispense Refill Last Dose  . allopurinol (ZYLOPRIM) 100 MG tablet Take 100 mg by mouth daily.   Taking  . aspirin 81 MG tablet Take 81 mg by mouth daily.    Taking  . atorvastatin (LIPITOR) 40 MG tablet TAKE 1 TABLET (40 MG TOTAL) BY MOUTH DAILY AT 6 PM. (Patient taking differently: TAKE 1 TABLET (40 MG TOTAL) BY MOUTH DAILY) 30 tablet 0 Taking  . bumetanide (BUMEX) 2 MG tablet Take 1 tablet (2 mg total) by mouth 2 (two) times  daily. 30 tablet 0 Taking  . KLOR-CON M20 20 MEQ tablet TAKE 1 TABLET (20 MEQ TOTAL) BY MOUTH DAILY. 30 tablet 0 Taking  . Multiple Vitamin (MULTIVITAMIN) tablet Take 1 tablet by mouth daily.     Taking  . ramipril (ALTACE) 5 MG capsule TAKE 1 CAPSULE (5 MG TOTAL) BY MOUTH DAILY. 90 capsule 2 Taking  . triamcinolone ointment (KENALOG) 0.5 % Apply 1 application topically daily.   Taking  . warfarin (COUMADIN) 5 MG tablet TAKE 1 TABLET BY MOUTH DAILY OR AS DIRECTED BY COUMADIN CLINIC 90 tablet 1 Taking    Assessment: 76 yo M on coumadin PTA for afib.  Pharmacy consulted to dose heparin for bridge therapy with potential need for invasive procedures.  Last coumadin clinic visit 07/14/16 INR = 1.9 and continued on prior dose of 5 mg daily.  Wt 102.8 kg.  INR was 3.12 on 2/26. Home coumadin dose is 5 mg qhs with last dose Tues 2/27 as Dr. Martinique told him not to take Wednesday night' dose.  Wt 99.7 kg. INR is therapeutic at 2.5, Hg 11.3, pltc 187.  Goal of Therapy:  Heparin level 0.3-0.7 units/ml Monitor platelets by anticoagulation protocol: Yes   Plan:  No bolus as INR is  therapeutic Start heparin drip at 1200 units/hr and check 8hr heparin level Daily heparin level and CBC  Eudelia Bunch, Pharm.D. BP:[phone] 07/27/2016 4:40 PM
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
## 2016-07-27 NOTE — Progress Notes (Signed)
Peripherally Inserted Central Catheter/Midline Placement  The IV Nurse has discussed with the patient and/or persons authorized to consent for the patient, the purpose of this procedure and the potential benefits and risks involved with this procedure.  The benefits include less needle sticks, lab draws from the catheter, and the patient may be discharged home with the catheter. Risks include, but not limited to, infection, bleeding, blood clot (thrombus formation), and puncture of an artery; nerve damage and irregular heartbeat and possibility to perform a PICC exchange if needed/ordered by physician.  Alternatives to this procedure were also discussed.  Bard Power PICC patient education guide, fact sheet on infection prevention and patient information card has been provided to patient /or left at bedside.    PICC/Midline Placement Documentation  PICC Double Lumen [id number] PICC Right Basilic 44 cm 0 cm (Active)  Indication for Insertion or Continuance of Line Vasoactive infusions 07/27/2016  5:33 PM  Exposed Catheter (cm) 0 cm 07/27/2016  5:33 PM  Site Assessment Clean;Dry;Intact 07/27/2016  5:33 PM  Lumen #1 Status Flushed;Saline locked;Blood return noted 07/27/2016  5:33 PM  Lumen #2 Status Flushed;Saline locked;Blood return noted 07/27/2016  5:33 PM  Dressing Type Transparent;Securing device 07/27/2016  5:33 PM  Dressing Change Due 08/03/16 07/27/2016  5:33 PM       Frances Maywood 07/27/2016, 5:36 PM

## 2016-07-27 NOTE — Telephone Encounter (Signed)
Pt says he is waiting to hear something today,about whether he is going to be admitted.

## 2016-07-27 NOTE — Telephone Encounter (Signed)
Spoke with bed control and they have spoken with patient and he should be arriving soon. Followed up with Trish and made aware of admission

## 2016-07-28 ENCOUNTER — Inpatient Hospital Stay (HOSPITAL_COMMUNITY): Payer: Medicare Other

## 2016-07-28 ENCOUNTER — Encounter (HOSPITAL_COMMUNITY): Payer: Self-pay

## 2016-07-28 DIAGNOSIS — I509 Heart failure, unspecified: Secondary | ICD-10-CM

## 2016-07-28 LAB — CBC
HEMATOCRIT: 34.1 % — AB (ref 39.0–52.0)
Hemoglobin: 10.9 g/dL — ABNORMAL LOW (ref 13.0–17.0)
MCH: 31.4 pg (ref 26.0–34.0)
MCHC: 32 g/dL (ref 30.0–36.0)
MCV: 98.3 fL (ref 78.0–100.0)
PLATELETS: 178 10*3/uL (ref 150–400)
RBC: 3.47 MIL/uL — ABNORMAL LOW (ref 4.22–5.81)
RDW: 17.2 % — AB (ref 11.5–15.5)
WBC: 6.8 10*3/uL (ref 4.0–10.5)

## 2016-07-28 LAB — COOXEMETRY PANEL
CARBOXYHEMOGLOBIN: 1.1 % (ref 0.5–1.5)
CARBOXYHEMOGLOBIN: 1.4 % (ref 0.5–1.5)
Carboxyhemoglobin: 1.3 % (ref 0.5–1.5)
METHEMOGLOBIN: 1 % (ref 0.0–1.5)
Methemoglobin: 1.1 % (ref 0.0–1.5)
Methemoglobin: 1.1 % (ref 0.0–1.5)
O2 SAT: 44.1 %
O2 SAT: 45.4 %
O2 Saturation: 52.9 %
TOTAL HEMOGLOBIN: 11.2 g/dL — AB (ref 12.0–16.0)
TOTAL HEMOGLOBIN: 10 g/dL — AB (ref 12.0–16.0)
Total hemoglobin: 10.6 g/dL — ABNORMAL LOW (ref 12.0–16.0)

## 2016-07-28 LAB — BASIC METABOLIC PANEL
ANION GAP: 14 (ref 5–15)
BUN: 48 mg/dL — ABNORMAL HIGH (ref 6–20)
CHLORIDE: 100 mmol/L — AB (ref 101–111)
CO2: 24 mmol/L (ref 22–32)
CREATININE: 1.78 mg/dL — AB (ref 0.61–1.24)
Calcium: 9.1 mg/dL (ref 8.9–10.3)
GFR calc non Af Amer: 36 mL/min — ABNORMAL LOW (ref >60)
GFR, EST AFRICAN AMERICAN: 42 mL/min — AB (ref >60)
Glucose, Bld: 88 mg/dL (ref 65–99)
Potassium: 4.1 mmol/L (ref 3.5–5.1)
SODIUM: 138 mmol/L (ref 135–145)

## 2016-07-28 LAB — ECHOCARDIOGRAM COMPLETE
HEIGHTINCHES: 75 in
WEIGHTICAEL: 3510.4 [oz_av]

## 2016-07-28 LAB — PROTIME-INR
INR: 2.64
Prothrombin Time: 28.7 seconds — ABNORMAL HIGH (ref 11.4–15.2)

## 2016-07-28 LAB — HEPARIN LEVEL (UNFRACTIONATED): HEPARIN UNFRACTIONATED: 0.31 [IU]/mL (ref 0.30–0.70)

## 2016-07-28 MED ORDER — SPIRONOLACTONE 25 MG PO TABS
12.5000 mg | ORAL_TABLET | Freq: Every day | ORAL | Status: DC
  Administered 2016-07-28 – 2016-08-06 (×10): 12.5 mg via ORAL
  Filled 2016-07-28 (×10): qty 1

## 2016-07-28 MED ORDER — PERFLUTREN LIPID MICROSPHERE
1.0000 mL | INTRAVENOUS | Status: AC | PRN
  Administered 2016-07-28: 4 mL via INTRAVENOUS
  Filled 2016-07-28: qty 10

## 2016-07-28 MED ORDER — METOLAZONE 2.5 MG PO TABS
2.5000 mg | ORAL_TABLET | Freq: Once | ORAL | Status: AC
  Administered 2016-07-28: 2.5 mg via ORAL
  Filled 2016-07-28: qty 1

## 2016-07-28 MED ORDER — FUROSEMIDE 10 MG/ML IJ SOLN
80.0000 mg | Freq: Three times a day (TID) | INTRAMUSCULAR | Status: DC
  Administered 2016-07-28 – 2016-07-30 (×8): 80 mg via INTRAVENOUS
  Filled 2016-07-28 (×8): qty 8

## 2016-07-28 MED ORDER — AMIODARONE HCL IN DEXTROSE 360-4.14 MG/200ML-% IV SOLN
60.0000 mg/h | INTRAVENOUS | Status: AC
  Administered 2016-07-28: 60 mg/h via INTRAVENOUS

## 2016-07-28 MED ORDER — FUROSEMIDE 10 MG/ML IJ SOLN
80.0000 mg | Freq: Three times a day (TID) | INTRAMUSCULAR | Status: DC
Start: 2016-07-28 — End: 2016-07-28

## 2016-07-28 MED ORDER — AMIODARONE HCL IN DEXTROSE 360-4.14 MG/200ML-% IV SOLN
30.0000 mg/h | INTRAVENOUS | Status: DC
  Administered 2016-07-28 – 2016-08-07 (×21): 30 mg/h via INTRAVENOUS
  Filled 2016-07-28 (×20): qty 200

## 2016-07-28 MED ORDER — AMIODARONE HCL IN DEXTROSE 360-4.14 MG/200ML-% IV SOLN
INTRAVENOUS | Status: AC
  Administered 2016-07-28: 30 mg/h via INTRAVENOUS
  Filled 2016-07-28: qty 200

## 2016-07-28 MED ORDER — MILRINONE LACTATE IN DEXTROSE 20-5 MG/100ML-% IV SOLN
0.3750 ug/kg/min | INTRAVENOUS | Status: DC
  Administered 2016-07-28: 0.375 ug/kg/min via INTRAVENOUS
  Administered 2016-07-28: 0.25 ug/kg/min via INTRAVENOUS
  Administered 2016-07-29 – 2016-08-04 (×18): 0.375 ug/kg/min via INTRAVENOUS
  Filled 2016-07-28 (×20): qty 100

## 2016-07-28 NOTE — Progress Notes (Signed)
  Echocardiogram 2D Echocardiogram with definity has been performed.  [name] M 07/28/2016, 11:18 AM

## 2016-07-28 NOTE — Progress Notes (Signed)
  Repeat coox 45% despite milrinone 0.25 mcg/kg/min.   Increase milrinone to 0.375 mcg/kg/min.   Will add 2.5 mg metolazone to afternoon dose of lasix with sluggish urine output so far today.     Legrand Como [address] [place], PA-C 07/28/2016 1:32 PM

## 2016-07-28 NOTE — Consult Note (Addendum)
[facility] Nurse wound consult note Reason for Consult: Consult requested for right ankle wound.  Pt is well-informed regarding topical treatment and is followed by the outpatient wound care center prior to admission and has serial debridements performed; he is due for an appointment on Monday.  He uses a "blue piece of dressing" every other day, but is not familiar with the name of the product.  Description is consistent with [facility], which is not available in the [facility].  Pt states he will have a family member bring in the topical treatment from home and he can change his own dressing. 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  Foam cover dressing to protect from further injury.  He can resume follow-up with the outpatient wound care center after discharge. Discussed plan of care with patient and he verbalized understanding. Please re-consult if further assistance is needed.  Thank-you,  Julien Girt MSN, [place], [place], [place], [place]

## 2016-07-28 NOTE — Progress Notes (Signed)
Pt's co-ox this morning was 44.1, CVP's have been 21 and 22. Only put out 550 ml of urine after lasix. Cards fellow notified. No new orders given. Will pass info to morning rounding team.

## 2016-07-28 NOTE — Progress Notes (Signed)
ANTICOAGULATION CONSULT NOTE - Follow Up Consult  Pharmacy Consult for Heparin (once INR < 2) Indication: atrial fibrillation  Allergies  Allergen Reactions  . Lidocaine Other (See Comments)    Passed out - at the dentist's office  . Procaine Hcl Other (See Comments)    Passed out - at the dentist's office    Patient Measurements: Height: 6\' 3"  (190.5 cm) Weight: 219 lb 6.4 oz (99.5 kg) IBW/kg (Calculated) : 84.5  Vital Signs: Temp: 97.5 F (36.4 C) (03/02 0831) Temp Source: Oral (03/02 0831) BP: 125/89 (03/02 0831) Pulse Rate: 112 (03/02 0831)  Labs:  Recent Labs  07/27/16 1933 07/28/16 0317 07/28/16 0739  HGB 11.3* 10.9*  --   HCT 36.0* 34.1*  --   PLT 187 178  --   LABPROT 27.5*  --  28.7*  INR 2.50  --  2.64  HEPARINUNFRC  --  0.31  --   CREATININE 1.97* 1.78*  --     Estimated Creatinine Clearance: 44.2 mL/min (by C-G formula based on SCr of 1.78 mg/dL (H)).   Assessment: 75yom on coumadin pta for afib, admitted with low output heart failure. Coumadin held pending [facility] and plan was to start heparin once INR < 2. INR 2.5 yesterday and heparin started anyway. Today's INR is 2.6 so will stop heparin.  Goal of Therapy:  Heparin level 0.3-0.7 units/ml Monitor platelets by anticoagulation protocol: Yes   Plan:  1) Stop heparin for now 2) Daily INR, resume heparin once < 2  Deboraha Sprang 07/28/2016,8:40 AM
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
## 2016-07-28 NOTE — Progress Notes (Signed)
ANTICOAGULATION CONSULT NOTE - Follow Up Consult  Pharmacy Consult for Heparin (warfarin on hold) Indication: atrial fibrillation  Allergies  Allergen Reactions  . Lidocaine Other (See Comments)    Passed out - at the dentist's office  . Procaine Hcl Other (See Comments)    Passed out - at the dentist's office   Patient Measurements: Height: 6\' 3"  (190.5 cm) Weight: 219 lb 12.8 oz (99.7 kg) IBW/kg (Calculated) : 84.5  Vital Signs: Temp: (P) 97.6 F (36.4 C) (03/02 0325) Temp Source: (P) Oral (03/02 0325) BP: (P) 115/85 (03/02 0325) Pulse Rate: 76 (03/02 0000)  Labs:  Recent Labs  07/27/16 1933 07/28/16 0317  HGB 11.3* 10.9*  HCT 36.0* 34.1*  PLT 187 178  LABPROT 27.5*  --   INR 2.50  --   HEPARINUNFRC  --  0.31  CREATININE 1.97*  --     Estimated Creatinine Clearance: 39.9 mL/min (by C-G formula based on SCr of 1.97 mg/dL (H)).  Assessment: Heparin while warfarin on hold in case procedure needed, initial heparin level therapeutic   Goal of Therapy:  Heparin level 0.3-0.7 units/ml Monitor platelets by anticoagulation protocol: Yes   Plan:  -Cont heparin at 1200 units/hr -1200 HL  Jason Choi 07/28/2016,4:01 AM

## 2016-07-29 LAB — BASIC METABOLIC PANEL
ANION GAP: 12 (ref 5–15)
BUN: 46 mg/dL — ABNORMAL HIGH (ref 6–20)
CALCIUM: 9 mg/dL (ref 8.9–10.3)
CHLORIDE: 99 mmol/L — AB (ref 101–111)
CO2: 27 mmol/L (ref 22–32)
CREATININE: 1.63 mg/dL — AB (ref 0.61–1.24)
GFR calc non Af Amer: 40 mL/min — ABNORMAL LOW (ref >60)
GFR, EST AFRICAN AMERICAN: 47 mL/min — AB (ref >60)
Glucose, Bld: 110 mg/dL — ABNORMAL HIGH (ref 65–99)
Potassium: 3.4 mmol/L — ABNORMAL LOW (ref 3.5–5.1)
SODIUM: 138 mmol/L (ref 135–145)

## 2016-07-29 LAB — PROTIME-INR
INR: 1.88
PROTHROMBIN TIME: 21.9 s — AB (ref 11.4–15.2)

## 2016-07-29 LAB — COOXEMETRY PANEL
CARBOXYHEMOGLOBIN: 1.9 % — AB (ref 0.5–1.5)
Methemoglobin: 0.9 % (ref 0.0–1.5)
O2 SAT: 62.6 %
TOTAL HEMOGLOBIN: 10.8 g/dL — AB (ref 12.0–16.0)

## 2016-07-29 LAB — CBC
HCT: 31.5 % — ABNORMAL LOW (ref 39.0–52.0)
Hemoglobin: 10 g/dL — ABNORMAL LOW (ref 13.0–17.0)
MCH: 30.9 pg (ref 26.0–34.0)
MCHC: 31.7 g/dL (ref 30.0–36.0)
MCV: 97.2 fL (ref 78.0–100.0)
PLATELETS: 167 10*3/uL (ref 150–400)
RBC: 3.24 MIL/uL — ABNORMAL LOW (ref 4.22–5.81)
RDW: 17.1 % — AB (ref 11.5–15.5)
WBC: 5.6 10*3/uL (ref 4.0–10.5)

## 2016-07-29 LAB — HEPARIN LEVEL (UNFRACTIONATED): Heparin Unfractionated: 0.35 IU/mL (ref 0.30–0.70)

## 2016-07-29 MED ORDER — METOLAZONE 2.5 MG PO TABS
2.5000 mg | ORAL_TABLET | Freq: Once | ORAL | Status: AC
  Administered 2016-07-29: 2.5 mg via ORAL
  Filled 2016-07-29: qty 1

## 2016-07-29 MED ORDER — CHLORHEXIDINE GLUCONATE CLOTH 2 % EX PADS: 6.0000 | MEDICATED_PAD | Freq: Every day | CUTANEOUS | Status: DC

## 2016-07-29 MED ORDER — POTASSIUM CHLORIDE CRYS ER 20 MEQ PO TBCR
40.0000 meq | EXTENDED_RELEASE_TABLET | Freq: Two times a day (BID) | ORAL | Status: DC
  Administered 2016-07-29 – 2016-08-06 (×17): 40 meq via ORAL
  Filled 2016-07-29 (×17): qty 2

## 2016-07-29 MED ORDER — BACITRACIN-NEOMYCIN-POLYMYXIN OINTMENT TUBE
TOPICAL_OINTMENT | Freq: Every day | CUTANEOUS | Status: DC
  Filled 2016-07-29: qty 14.17

## 2016-07-29 MED ORDER — HEPARIN (PORCINE) IN NACL 100-0.45 UNIT/ML-% IJ SOLN
1250.0000 [IU]/h | INTRAMUSCULAR | Status: DC
  Administered 2016-07-29 – 2016-07-31 (×4): 1250 [IU]/h via INTRAVENOUS
  Filled 2016-07-29 (×3): qty 250

## 2016-07-29 MED ORDER — SODIUM CHLORIDE 0.9% FLUSH
10.0000 mL | Freq: Two times a day (BID) | INTRAVENOUS | Status: DC
  Administered 2016-07-29 – 2016-08-08 (×10): 10 mL

## 2016-07-29 MED ORDER — SODIUM CHLORIDE 0.9% FLUSH
10.0000 mL | INTRAVENOUS | Status: DC | PRN
  Administered 2016-08-03: 10 mL
  Filled 2016-07-29: qty 40
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
## 2016-07-29 NOTE — Progress Notes (Signed)
ANTICOAGULATION CONSULT NOTE - Follow Up Consult  Pharmacy Consult for Heparin (warfarin on hold) Indication: atrial fibrillation  Allergies  Allergen Reactions  . Lidocaine Other (See Comments)    Passed out - at the dentist's office  . Procaine Hcl Other (See Comments)    Passed out - at the dentist's office   Patient Measurements: Height: 6\' 3"  (190.5 cm) Weight: 213 lb 14.4 oz (97 kg) IBW/kg (Calculated) : 84.5  Vital Signs: Temp: 98 F (36.7 C) (03/03 0406) Temp Source: Oral (03/03 0406) BP: 100/83 (03/03 0406) Pulse Rate: 93 (03/03 0406)  Labs:  Recent Labs  07/27/16 1933 07/28/16 0317 07/28/16 0739 07/29/16 0345  HGB 11.3* 10.9*  --  10.0*  HCT 36.0* 34.1*  --  31.5*  PLT 187 178  --  167  LABPROT 27.5*  --  28.7* 21.9*  INR 2.50  --  2.64 1.88  HEPARINUNFRC  --  0.31  --   --   CREATININE 1.97* 1.78*  --   --     Estimated Creatinine Clearance: 44.2 mL/min (by C-G formula based on SCr of 1.78 mg/dL (H)).  Assessment: Heparin while warfarin on hold in case procedure needed, INR is 1.88 this AM>>will start heparin.   Goal of Therapy:  Heparin level 0.3-0.7 units/ml Monitor platelets by anticoagulation protocol: Yes   Plan:  -Start heparin drip at 1250 units/hr -1300 HL  Narda Bonds 07/29/2016,4:13 AM

## 2016-07-29 NOTE — Progress Notes (Signed)
ANTICOAGULATION CONSULT NOTE - Follow Up Consult  Pharmacy Consult for Heparin (warfarin on hold) Indication: atrial fibrillation  Allergies  Allergen Reactions  . Lidocaine Other (See Comments)    Passed out - at the dentist's office  . Procaine Hcl Other (See Comments)    Passed out - at the dentist's office   Patient Measurements: Height: 6\' 3"  (190.5 cm) Weight: 213 lb 14.4 oz (97 kg) IBW/kg (Calculated) : 84.5  Vital Signs: Temp: 98.5 F (36.9 C) (03/03 1151) Temp Source: Oral (03/03 1151) BP: 121/75 (03/03 1151) Pulse Rate: 87 (03/03 1151)  Labs:  Recent Labs  07/27/16 1933 07/28/16 0317 07/28/16 0739 07/29/16 0345 07/29/16 1241  HGB 11.3* 10.9*  --  10.0*  --   HCT 36.0* 34.1*  --  31.5*  --   PLT 187 178  --  167  --   LABPROT 27.5*  --  28.7* 21.9*  --   INR 2.50  --  2.64 1.88  --   HEPARINUNFRC  --  0.31  --   --  0.35  CREATININE 1.97* 1.78*  --  1.63*  --     Estimated Creatinine Clearance: 48.2 mL/min (by C-G formula based on SCr of 1.63 mg/dL (H)).   Marland Kitchen amiodarone 30 mg/hr (07/29/16 1249)  . heparin 1,250 Units/hr (07/29/16 1200)  . milrinone 0.375 mcg/kg/min (07/29/16 1200)     Assessment: 76 yo male on chronic Coumadin for afib, currently on hold in case procedure needed.  On IV heparin with therapeutic heparin level.  CBC stable, no bleeding or complications noted.  Goal of Therapy:  Heparin level 0.3-0.7 units/ml Monitor platelets by anticoagulation protocol: Yes   Plan:  -Continue IV heparin at current rate. -Daily heparin level and CBC. -F/u plans to resume Coumadin when able.  Jason Choi, BCPS  Clinical Pharmacist Pager [phone]  07/29/2016 2:04 PM

## 2016-07-30 LAB — CBC
HEMATOCRIT: 30.9 % — AB (ref 39.0–52.0)
HEMOGLOBIN: 10 g/dL — AB (ref 13.0–17.0)
MCH: 31.1 pg (ref 26.0–34.0)
MCHC: 32.4 g/dL (ref 30.0–36.0)
MCV: 96 fL (ref 78.0–100.0)
Platelets: 170 10*3/uL (ref 150–400)
RBC: 3.22 MIL/uL — AB (ref 4.22–5.81)
RDW: 17.2 % — ABNORMAL HIGH (ref 11.5–15.5)
WBC: 9.8 10*3/uL (ref 4.0–10.5)

## 2016-07-30 LAB — COOXEMETRY PANEL
Carboxyhemoglobin: 1.5 % (ref 0.5–1.5)
Methemoglobin: 1.2 % (ref 0.0–1.5)
O2 Saturation: 59.4 %
Total hemoglobin: 10.1 g/dL — ABNORMAL LOW (ref 12.0–16.0)

## 2016-07-30 LAB — BASIC METABOLIC PANEL
Anion gap: 11 (ref 5–15)
BUN: 39 mg/dL — AB (ref 6–20)
CALCIUM: 8.9 mg/dL (ref 8.9–10.3)
CO2: 30 mmol/L (ref 22–32)
Chloride: 95 mmol/L — ABNORMAL LOW (ref 101–111)
Creatinine, Ser: 1.78 mg/dL — ABNORMAL HIGH (ref 0.61–1.24)
GFR calc Af Amer: 42 mL/min — ABNORMAL LOW (ref >60)
GFR, EST NON AFRICAN AMERICAN: 36 mL/min — AB (ref >60)
GLUCOSE: 109 mg/dL — AB (ref 65–99)
Potassium: 3.9 mmol/L (ref 3.5–5.1)
Sodium: 136 mmol/L (ref 135–145)

## 2016-07-30 LAB — HEPARIN LEVEL (UNFRACTIONATED): Heparin Unfractionated: 0.35 IU/mL (ref 0.30–0.70)

## 2016-07-30 LAB — PROTIME-INR
INR: 1.64
PROTHROMBIN TIME: 19.6 s — AB (ref 11.4–15.2)

## 2016-07-30 MED ORDER — METOLAZONE 2.5 MG PO TABS
2.5000 mg | ORAL_TABLET | Freq: Two times a day (BID) | ORAL | Status: AC
  Administered 2016-07-30 (×2): 2.5 mg via ORAL
  Filled 2016-07-30 (×2): qty 1

## 2016-07-30 MED ORDER — METOLAZONE 2.5 MG PO TABS: 2.5000 mg | ORAL_TABLET | Freq: Two times a day (BID) | ORAL | Status: DC
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
## 2016-07-30 NOTE — Progress Notes (Signed)
ANTICOAGULATION CONSULT NOTE - Follow Up Consult  Pharmacy Consult for Heparin Indication: atrial fibrillation  Allergies  Allergen Reactions  . Lidocaine Other (See Comments)    Passed out - at the dentist's office  . Procaine Hcl Other (See Comments)    Passed out - at the dentist's office    Patient Measurements: Height: 6\' 3"  (190.5 cm) Weight: 211 lb 11.2 oz (96 kg) IBW/kg (Calculated) : 84.5  Vital Signs: Temp: 98.2 F (36.8 C) (03/04 0736) Temp Source: Oral (03/04 0736) BP: 109/84 (03/04 0736) Pulse Rate: 101 (03/04 0300)  Labs:  Recent Labs  07/28/16 0317 07/28/16 0739 07/29/16 0345 07/29/16 1241 07/30/16 0430  HGB 10.9*  --  10.0*  --  10.0*  HCT 34.1*  --  31.5*  --  30.9*  PLT 178  --  167  --  170  LABPROT  --  28.7* 21.9*  --  19.6*  INR  --  2.64 1.88  --  1.64  HEPARINUNFRC 0.31  --   --  0.35 0.35  CREATININE 1.78*  --  1.63*  --  1.78*    Estimated Creatinine Clearance: 44.2 mL/min (by C-G formula based on SCr of 1.78 mg/dL (H)).   Medications:  Heparin @ 1250 units/hr  Assessment: 75yom continues on heparin for afib while coumadin on hold. Heparin level is therapeutic at 0.35. CBC stable. INR down to 1.64. No bleeding. Plan for [facility] once diuresed.  Goal of Therapy:  Heparin level 0.3-0.7 units/ml Monitor platelets by anticoagulation protocol: Yes   Plan:  1) Continue heparin at 1250 units/hr 2) Daily heparin level, CBC  Deboraha Sprang 07/30/2016,7:59 AM

## 2016-07-31 ENCOUNTER — Encounter (HOSPITAL_BASED_OUTPATIENT_CLINIC_OR_DEPARTMENT_OTHER): Payer: Medicare Other | Attending: Internal Medicine

## 2016-07-31 LAB — CBC
HCT: 27.9 % — ABNORMAL LOW (ref 39.0–52.0)
HEMOGLOBIN: 9.3 g/dL — AB (ref 13.0–17.0)
MCH: 31.8 pg (ref 26.0–34.0)
MCHC: 33.3 g/dL (ref 30.0–36.0)
MCV: 95.5 fL (ref 78.0–100.0)
Platelets: 167 10*3/uL (ref 150–400)
RBC: 2.92 MIL/uL — ABNORMAL LOW (ref 4.22–5.81)
RDW: 17 % — ABNORMAL HIGH (ref 11.5–15.5)
WBC: 7.7 10*3/uL (ref 4.0–10.5)

## 2016-07-31 LAB — BASIC METABOLIC PANEL
Anion gap: 8 (ref 5–15)
Anion gap: 9 (ref 5–15)
BUN: 43 mg/dL — AB (ref 6–20)
BUN: 44 mg/dL — AB (ref 6–20)
CALCIUM: 8.3 mg/dL — AB (ref 8.9–10.3)
CHLORIDE: 95 mmol/L — AB (ref 101–111)
CHLORIDE: 94 mmol/L — AB (ref 101–111)
CO2: 29 mmol/L (ref 22–32)
CO2: 28 mmol/L (ref 22–32)
CREATININE: 2.05 mg/dL — AB (ref 0.61–1.24)
CREATININE: 2.12 mg/dL — AB (ref 0.61–1.24)
Calcium: 8.7 mg/dL — ABNORMAL LOW (ref 8.9–10.3)
GFR calc Af Amer: 35 mL/min — ABNORMAL LOW (ref >60)
GFR calc non Af Amer: 30 mL/min — ABNORMAL LOW (ref >60)
GFR, EST AFRICAN AMERICAN: 34 mL/min — AB (ref >60)
GFR, EST NON AFRICAN AMERICAN: 29 mL/min — AB (ref >60)
GLUCOSE: 184 mg/dL — AB (ref 65–99)
Glucose, Bld: 148 mg/dL — ABNORMAL HIGH (ref 65–99)
POTASSIUM: 4.5 mmol/L (ref 3.5–5.1)
Potassium: 4.4 mmol/L (ref 3.5–5.1)
SODIUM: 131 mmol/L — AB (ref 135–145)
Sodium: 132 mmol/L — ABNORMAL LOW (ref 135–145)

## 2016-07-31 LAB — MAGNESIUM: MAGNESIUM: 1.9 mg/dL (ref 1.7–2.4)

## 2016-07-31 LAB — PROTIME-INR
INR: 1.67
Prothrombin Time: 19.9 seconds — ABNORMAL HIGH (ref 11.4–15.2)

## 2016-07-31 LAB — COOXEMETRY PANEL
Carboxyhemoglobin: 1.6 % — ABNORMAL HIGH (ref 0.5–1.5)
Methemoglobin: 1.2 % (ref 0.0–1.5)
O2 Saturation: 59.2 %
Total hemoglobin: 8.9 g/dL — ABNORMAL LOW (ref 12.0–16.0)

## 2016-07-31 LAB — HEPARIN LEVEL (UNFRACTIONATED): Heparin Unfractionated: 0.3 IU/mL (ref 0.30–0.70)

## 2016-07-31 MED ORDER — METOLAZONE 2.5 MG PO TABS
2.5000 mg | ORAL_TABLET | Freq: Once | ORAL | Status: AC
  Administered 2016-07-31: 2.5 mg via ORAL
  Filled 2016-07-31: qty 1

## 2016-07-31 MED ORDER — NOREPINEPHRINE BITARTRATE 1 MG/ML IV SOLN
3.0000 ug/min | INTRAVENOUS | Status: AC
  Administered 2016-08-01 – 2016-08-02 (×3): 3 ug/min via INTRAVENOUS
  Filled 2016-07-31 (×4): qty 4

## 2016-07-31 MED ORDER — GUAIFENESIN-DM 100-10 MG/5ML PO SYRP
5.0000 mL | ORAL_SOLUTION | ORAL | Status: DC | PRN
  Administered 2016-07-31 – 2016-08-02 (×4): 5 mL via ORAL
  Filled 2016-07-31 (×4): qty 5

## 2016-07-31 MED ORDER — FUROSEMIDE 10 MG/ML IJ SOLN
80.0000 mg | Freq: Once | INTRAMUSCULAR | Status: AC
  Administered 2016-07-31: 80 mg via INTRAVENOUS
  Filled 2016-07-31: qty 8

## 2016-07-31 MED ORDER — NOREPINEPHRINE BITARTRATE 1 MG/ML IV SOLN
3.0000 ug/min | INTRAVENOUS | Status: AC
  Administered 2016-07-31: 3 ug/min via INTRAVENOUS
  Filled 2016-07-31: qty 4

## 2016-07-31 MED ORDER — DEXTROSE 5 % IV SOLN
20.0000 mg/h | INTRAVENOUS | Status: DC
  Administered 2016-07-31: 12 mg/h via INTRAVENOUS
  Administered 2016-08-01: 20 mg/h via INTRAVENOUS
  Administered 2016-08-01: 12 mg/h via INTRAVENOUS
  Administered 2016-08-02 – 2016-08-03 (×4): 20 mg/h via INTRAVENOUS
  Filled 2016-07-31 (×14): qty 25

## 2016-07-31 MED ORDER — HEPARIN (PORCINE) IN NACL 100-0.45 UNIT/ML-% IJ SOLN
1400.0000 [IU]/h | INTRAMUSCULAR | Status: DC
  Administered 2016-07-31 – 2016-08-02 (×3): 1300 [IU]/h via INTRAVENOUS
  Administered 2016-08-03: 1400 [IU]/h via INTRAVENOUS
  Filled 2016-07-31 (×4): qty 250

## 2016-07-31 MED ORDER — MAGNESIUM SULFATE 2 GM/50ML IV SOLN
2.0000 g | Freq: Once | INTRAVENOUS | Status: AC
  Administered 2016-07-31: 2 g via INTRAVENOUS
  Filled 2016-07-31: qty 50

## 2016-07-31 NOTE — Progress Notes (Signed)
Pt had non-sustained v-tach at around 1200, asymptomatic.  NP made aware with order. Will continue to monitor pt.

## 2016-07-31 NOTE — Progress Notes (Signed)
0800 Noted pt started on levophed. Will hold ambulation and continue to follow. Graylon Good RN BSN 07/31/2016 8:01 AM
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
## 2016-07-31 NOTE — Progress Notes (Cosign Needed)
  Earlier today Norepi 3 mcg started and continued on milrinone 0.375 mcg.    16 beats NSVT at 12:29.   Asymptomatic. CVP 17. Urine output remains sluggish.   Check K and Mag now.   Amy Clegg NP-C  3:06 PM

## 2016-07-31 NOTE — Progress Notes (Signed)
ANTICOAGULATION CONSULT NOTE - Follow Up Consult  Pharmacy Consult for Heparin Indication: atrial fibrillation  Allergies  Allergen Reactions  . Lidocaine Other (See Comments)    Passed out - at the dentist's office  . Procaine Hcl Other (See Comments)    Passed out - at the dentist's office    Patient Measurements: Height: 6\' 3"  (190.5 cm) Weight: 216 lb 3.2 oz (98.1 kg) IBW/kg (Calculated) : 84.5  Vital Signs: Temp: 98.4 F (36.9 C) (03/05 0737) Temp Source: Oral (03/05 0737) BP: 104/64 (03/05 1000) Pulse Rate: 94 (03/05 0737)  Labs:  Recent Labs  07/29/16 0345 07/29/16 1241 07/30/16 0430 07/31/16 0447  HGB 10.0*  --  10.0* 9.3*  HCT 31.5*  --  30.9* 27.9*  PLT 167  --  170 167  LABPROT 21.9*  --  19.6* 19.9*  INR 1.88  --  1.64 1.67  HEPARINUNFRC  --  0.35 0.35 0.30  CREATININE 1.63*  --  1.78* 2.05*    Estimated Creatinine Clearance: 38.4 mL/min (by C-G formula based on SCr of 2.05 mg/dL (H)).   Medications:  Heparin @ 1250 units/hr  Assessment: 75yom continues on heparin for afib while coumadin on hold. Heparin level is therapeutic at 0.30. Hgb down a little 10 > 9.3. INR 1.67. No bleeding. Plan for [facility] once diuresed.  Goal of Therapy:  Heparin level 0.3-0.7 units/ml Monitor platelets by anticoagulation protocol: Yes   Plan:  1) Increase heparin slightly to 1300 units/hr 2) Daily heparin level, CBC  Deboraha Sprang 07/31/2016,11:11 AM

## 2016-08-01 ENCOUNTER — Inpatient Hospital Stay (HOSPITAL_COMMUNITY): Payer: Medicare Other

## 2016-08-01 DIAGNOSIS — N179 Acute kidney failure, unspecified: Secondary | ICD-10-CM

## 2016-08-01 LAB — CBC
HCT: 28.9 % — ABNORMAL LOW (ref 39.0–52.0)
Hemoglobin: 9.7 g/dL — ABNORMAL LOW (ref 13.0–17.0)
MCH: 31.7 pg (ref 26.0–34.0)
MCHC: 33.6 g/dL (ref 30.0–36.0)
MCV: 94.4 fL (ref 78.0–100.0)
PLATELETS: 181 10*3/uL (ref 150–400)
RBC: 3.06 MIL/uL — AB (ref 4.22–5.81)
RDW: 17.2 % — ABNORMAL HIGH (ref 11.5–15.5)
WBC: 8.7 10*3/uL (ref 4.0–10.5)

## 2016-08-01 LAB — BASIC METABOLIC PANEL
Anion gap: 10 (ref 5–15)
BUN: 45 mg/dL — ABNORMAL HIGH (ref 6–20)
CALCIUM: 8.7 mg/dL — AB (ref 8.9–10.3)
CHLORIDE: 93 mmol/L — AB (ref 101–111)
CO2: 27 mmol/L (ref 22–32)
CREATININE: 2.02 mg/dL — AB (ref 0.61–1.24)
GFR calc non Af Amer: 31 mL/min — ABNORMAL LOW (ref >60)
GFR, EST AFRICAN AMERICAN: 36 mL/min — AB (ref >60)
Glucose, Bld: 118 mg/dL — ABNORMAL HIGH (ref 65–99)
Potassium: 4.6 mmol/L (ref 3.5–5.1)
SODIUM: 130 mmol/L — AB (ref 135–145)

## 2016-08-01 LAB — COOXEMETRY PANEL
Carboxyhemoglobin: 1.3 % (ref 0.5–1.5)
METHEMOGLOBIN: 0.8 % (ref 0.0–1.5)
O2 SAT: 72.8 %
Total hemoglobin: 18 g/dL — ABNORMAL HIGH (ref 12.0–16.0)

## 2016-08-01 LAB — HEPARIN LEVEL (UNFRACTIONATED): Heparin Unfractionated: 0.3 IU/mL (ref 0.30–0.70)

## 2016-08-01 LAB — PROTIME-INR
INR: 1.48
Prothrombin Time: 18.1 seconds — ABNORMAL HIGH (ref 11.4–15.2)

## 2016-08-01 LAB — DIGOXIN LEVEL: Digoxin Level: 0.6 ng/mL — ABNORMAL LOW (ref 0.8–2.0)

## 2016-08-01 MED ORDER — SODIUM CHLORIDE 0.9 % IV SOLN: 600.0000 mg | Freq: Two times a day (BID) | INTRAVENOUS | Status: DC

## 2016-08-01 MED ORDER — PIPERACILLIN-TAZOBACTAM 3.375 G IVPB
3.3750 g | Freq: Three times a day (TID) | INTRAVENOUS | Status: DC
  Administered 2016-08-01 – 2016-08-03 (×8): 3.375 g via INTRAVENOUS
  Filled 2016-08-01 (×10): qty 50

## 2016-08-01 MED ORDER — VANCOMYCIN HCL 10 G IV SOLR
2000.0000 mg | Freq: Once | INTRAVENOUS | Status: AC
  Administered 2016-08-01: 2000 mg via INTRAVENOUS
  Filled 2016-08-01: qty 2000

## 2016-08-01 MED ORDER — METOLAZONE 5 MG PO TABS
5.0000 mg | ORAL_TABLET | Freq: Two times a day (BID) | ORAL | Status: AC
  Administered 2016-08-01 (×2): 5 mg via ORAL
  Filled 2016-08-01 (×2): qty 1

## 2016-08-01 MED ORDER — VANCOMYCIN HCL IN DEXTROSE 1-5 GM/200ML-% IV SOLN
1000.0000 mg | INTRAVENOUS | Status: DC
  Administered 2016-08-02 – 2016-08-03 (×2): 1000 mg via INTRAVENOUS
  Filled 2016-08-01 (×2): qty 200

## 2016-08-01 NOTE — Progress Notes (Signed)
PT Cancellation Note  Patient Details Name: ALBARAA BUNTAIN MRN: [id number] DOB: 1940/07/27   Cancelled Treatment:    Reason Eval/Treat Not Completed: Other (comment). Pt had just returned to bed. Per nursing pt a 2 person assist to transfer due to painful RLE. Will try again tomorrow.   Shary Decamp Maycok 08/01/2016, 2:52 PM [employer] PT [phone]

## 2016-08-01 NOTE — Progress Notes (Signed)
OT Cancellation Note  Patient Details Name: Jason Choi MRN: [id number] DOB: [birth year]   Cancelled Treatment:    Reason Eval/Treat Not Completed: Fatigue/lethargy limiting ability to participate;Pain limiting ability to participate (Pt recently returned to bed. Will follow.)  Malka So 08/01/2016, 3:06 PM
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
## 2016-08-01 NOTE — Progress Notes (Signed)
ANTICOAGULATION CONSULT NOTE - Follow Up Consult  Pharmacy Consult for Heparin Indication: atrial fibrillation  Allergies  Allergen Reactions  . Lidocaine Other (See Comments)    Passed out - at the dentist's office  . Procaine Hcl Other (See Comments)    Passed out - at the dentist's office    Patient Measurements: Height: 6\' 3"  (190.5 cm) Weight: 217 lb 8 oz (98.7 kg) IBW/kg (Calculated) : 84.5  Vital Signs: Temp: 98.5 F (36.9 C) (03/06 1100) Temp Source: Oral (03/06 1100) BP: 103/67 (03/06 1100) Pulse Rate: 89 (03/06 1100)  Labs:  Recent Labs  07/30/16 0430 07/31/16 0447 07/31/16 1512 08/01/16 0448 08/01/16 0449  HGB 10.0* 9.3*  --  9.7*  --   HCT 30.9* 27.9*  --  28.9*  --   PLT 170 167  --  181  --   LABPROT 19.6* 19.9*  --   --  18.1*  INR 1.64 1.67  --   --  1.48  HEPARINUNFRC 0.35 0.30  --   --  0.30  CREATININE 1.78* 2.05* 2.12* 2.02*  --     Estimated Creatinine Clearance: 38.9 mL/min (by C-G formula based on SCr of 2.02 mg/dL (H)).     Assessment: 75yom continues on heparin for afib while coumadin on hold. Heparin level is therapeutic at 0.30, heparin drip 1300 uts/hr. CBC stable. INR 1.48. No bleeding. Plan for [facility] once diuresed and renal function stable.  Goal of Therapy:  Heparin level 0.3-0.7 units/ml Monitor platelets by anticoagulation protocol: Yes   Plan:  1) Continue  heparin 1300 units/hr 2) Daily heparin level, CBC  Bonnita Nasuti Pharm.D. CPP, BCPS Clinical Pharmacist [phone] 08/01/2016 12:00 PM

## 2016-08-01 NOTE — Progress Notes (Signed)
Pharmacy Antibiotic Note  Jason Choi is a 76 y.o. male with RLE cellulitis  Pharmacy has been consulted for Vancomycin and Zosyn  dosing.  Plan: Vancomycin 2000 mg IV now, then 1000 mg IV q24h Zosyn 3.375 g IV q8h  F/U renal function  Height: 6\' 3"  (190.5 cm) Weight: 216 lb 3.2 oz (98.1 kg) IBW/kg (Calculated) : 84.5  Temp (24hrs), Avg:98.7 F (37.1 C), Min:97.7 F (36.5 C), Max:99.4 F (37.4 C)   Recent Labs Lab 07/27/16 1933 07/28/16 0317 07/29/16 0345 07/30/16 0430 07/31/16 0447 07/31/16 1512  WBC 6.1 6.8 5.6 9.8 7.7  --   CREATININE 1.97* 1.78* 1.63* 1.78* 2.05* 2.12*    Estimated Creatinine Clearance: 37.1 mL/min (by C-G formula based on SCr of 2.12 mg/dL (H)).    Allergies  Allergen Reactions  . Lidocaine Other (See Comments)    Passed out - at the dentist's office  . Procaine Hcl Other (See Comments)    Passed out - at the dentist's office    Darvin Dials, [place] Curb 08/01/2016 1:15 AM

## 2016-08-01 NOTE — Progress Notes (Signed)
Pt worried about right foot.  Has chronic wound being treated at wound center but he states his right foot has swollen markedly since last pm.  Right foot is hot to touch, angry  red from toes to just below knee,  Swollen- with 3+ edema at foot, palpable pedal pulse.  Temp 99.4  Dr Kenton Kingfisher notified of same and came to bedside.plan for antibiotics and f/u in AM

## 2016-08-01 NOTE — Consult Note (Addendum)
Consult for right foot wound was performed on 3/6 ; refer to previous progress notes.  Pt has now developed cellulitis to RLE involving lower leg and foot and surrounding wound; generalized edema and erythremia, warm to touch and painful.  Wound is unchanged in appearance; 2X2X.3cm, small amt tan drainage, no odor, 85% red, 15% yellow.  New deeper pocket has opened at 7:00 o'clock to wound bed.  Pt has been using Hydroferra blue, which is an absorbant, antimicrobial dressing that was prescribed by the outpatient wound care center. This topical treatment is not available in the [place], so patient has brought his own from home for use with dressing changes. Please consider scan to R/O osteomyelitis; pt has developed cellulitis this week to RLE and is at risk since bone is palpable to the foot wound.  Please re-consult if further assistance is needed.  Thank-you,  Julien Girt MSN, [place], [place], [place], [place]

## 2016-08-02 DIAGNOSIS — Z87891 Personal history of nicotine dependence: Secondary | ICD-10-CM

## 2016-08-02 DIAGNOSIS — M868X7 Other osteomyelitis, ankle and foot: Secondary | ICD-10-CM

## 2016-08-02 DIAGNOSIS — M86071 Acute hematogenous osteomyelitis, right ankle and foot: Secondary | ICD-10-CM

## 2016-08-02 DIAGNOSIS — Z8249 Family history of ischemic heart disease and other diseases of the circulatory system: Secondary | ICD-10-CM

## 2016-08-02 DIAGNOSIS — Z7982 Long term (current) use of aspirin: Secondary | ICD-10-CM

## 2016-08-02 DIAGNOSIS — L03119 Cellulitis of unspecified part of limb: Secondary | ICD-10-CM

## 2016-08-02 DIAGNOSIS — L02619 Cutaneous abscess of unspecified foot: Secondary | ICD-10-CM

## 2016-08-02 DIAGNOSIS — Z79899 Other long term (current) drug therapy: Secondary | ICD-10-CM

## 2016-08-02 LAB — CBC
HCT: 29.5 % — ABNORMAL LOW (ref 39.0–52.0)
HEMOGLOBIN: 9.7 g/dL — AB (ref 13.0–17.0)
MCH: 30.7 pg (ref 26.0–34.0)
MCHC: 32.9 g/dL (ref 30.0–36.0)
MCV: 93.4 fL (ref 78.0–100.0)
Platelets: 210 10*3/uL (ref 150–400)
RBC: 3.16 MIL/uL — ABNORMAL LOW (ref 4.22–5.81)
RDW: 16.9 % — AB (ref 11.5–15.5)
WBC: 9.4 10*3/uL (ref 4.0–10.5)

## 2016-08-02 LAB — PROTIME-INR
INR: 1.49
PROTHROMBIN TIME: 18.2 s — AB (ref 11.4–15.2)

## 2016-08-02 LAB — BASIC METABOLIC PANEL
Anion gap: 14 (ref 5–15)
BUN: 43 mg/dL — AB (ref 6–20)
CHLORIDE: 86 mmol/L — AB (ref 101–111)
CO2: 28 mmol/L (ref 22–32)
CREATININE: 2.16 mg/dL — AB (ref 0.61–1.24)
Calcium: 8.9 mg/dL (ref 8.9–10.3)
GFR calc Af Amer: 33 mL/min — ABNORMAL LOW (ref >60)
GFR calc non Af Amer: 29 mL/min — ABNORMAL LOW (ref >60)
Glucose, Bld: 225 mg/dL — ABNORMAL HIGH (ref 65–99)
Potassium: 4.2 mmol/L (ref 3.5–5.1)
SODIUM: 128 mmol/L — AB (ref 135–145)

## 2016-08-02 LAB — COOXEMETRY PANEL
CARBOXYHEMOGLOBIN: 1.8 % — AB (ref 0.5–1.5)
METHEMOGLOBIN: 0.6 % (ref 0.0–1.5)
O2 Saturation: 59.3 %
TOTAL HEMOGLOBIN: 16.1 g/dL — AB (ref 12.0–16.0)

## 2016-08-02 LAB — HEPARIN LEVEL (UNFRACTIONATED): HEPARIN UNFRACTIONATED: 0.25 [IU]/mL — AB (ref 0.30–0.70)

## 2016-08-02 LAB — URIC ACID: URIC ACID, SERUM: 7.8 mg/dL — AB (ref 4.4–7.6)

## 2016-08-02 LAB — MAGNESIUM: Magnesium: 2 mg/dL (ref 1.7–2.4)

## 2016-08-02 MED ORDER — MAGNESIUM SULFATE 2 GM/50ML IV SOLN
2.0000 g | Freq: Once | INTRAVENOUS | Status: AC
  Administered 2016-08-02: 2 g via INTRAVENOUS
  Filled 2016-08-02: qty 50

## 2016-08-02 NOTE — Evaluation (Signed)
Occupational Therapy Evaluation Patient Details Name: Jason Choi MRN: [id number] DOB: 1940-06-05 Today's Date: 08/02/2016    History of Present Illness Pt adm with syncope and acute on chronic heart failure. 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 PMH - chf, polio, cad, ckd, afib   Clinical Impression   Pt with decline in function and safety with ADLs and ADL mobility with decreased balance, strength and endurance. Pt is limited by pain/edema in R foot with difficulty bearing weight.  Pt lives at home alone and children do not live in the area. Pt reports that friends and neighbors can assist, however uncertain of friends and neightbors can provide current level of care. Recommending ST SNF depending on progress of R foot affecting ADL/functional mobility. Pt would benefit from acute OT services to address impairments to increase level of safety and function    Follow Up Recommendations  SNF (depending  on progress of R foot)    Equipment Recommendations  None recommended by OT    Recommendations for Other Services       Precautions / Restrictions Precautions Precautions: Fall Restrictions Weight Bearing Restrictions: No Other Position/Activity Restrictions: R foot very red, edematous and painful making it difficult to weight bear      Mobility Bed Mobility Overal bed mobility: Needs Assistance Bed Mobility: Sidelying to Sit   Sidelying to sit: Min assist       General bed mobility comments: Assist to elevate trunk into sitting  Transfers Overall transfer level: Needs assistance Equipment used: Rolling walker (2 wheeled) Transfers: Sit to/from [facility] Sit to Stand: +2 physical assistance;Min assist Stand pivot transfers: +2 physical assistance;Min assist       General transfer comment: Assist to bring hips up and for balance. Pt took pivotal steps with walker. Pt limited due to pain in rt foot.    Balance  Overall balance assessment: Needs assistance Sitting-balance support: No upper extremity supported;Feet supported Sitting balance-Leahy Scale: Good     Standing balance support: Bilateral upper extremity supported Standing balance-Leahy Scale: Poor Standing balance comment: walker and min a for static standing                            ADL Overall ADL's : Needs assistance/impaired     Grooming: Wash/dry hands;Wash/dry face;Sitting;Set up   Upper Body Bathing: Set up;Sitting   Lower Body Bathing: Maximal assistance   Upper Body Dressing : Sitting   Lower Body Dressing: Maximal assistance Lower Body Dressing Details (indicate cue type and reason): R foot very sensative to touch when donning sock for in prep for transfers Toilet Transfer: Minimal assistance;+2 for physical assistance;RW (simulated to recliner) Toilet Transfer Details (indicate cue type and reason):  pt reports that he has walked to bathroom with staff, however R foot very painful and unable with therapy at this time Toileting- Clothing Manipulation and Hygiene: Maximal assistance       Functional mobility during ADLs: Minimal assistance;+2 for physical assistance;Rolling walker General ADL Comments: Due to R foot pain/edema pt required extensive assist with LB ADLs or any ADL sit - stand/standing     Vision   Vision Assessment?: No apparent visual deficits                Pertinent Vitals/Pain Pain Assessment: 0-10 Pain Score: 7  Pain Location: R foot Pain Descriptors / Indicators: Radiating Pain Intervention(s): Limited activity within patient's tolerance;Monitored during session;Repositioned  Hand Dominance Right   Extremity/Trunk Assessment Upper Extremity Assessment Upper Extremity Assessment: Overall WFL for tasks assessed   Lower Extremity Assessment Lower Extremity Assessment: Defer to PT evaluation RLE Deficits / Details: Active movement against gravity but limited function  due to pain. Pt also with some residual weakness due to polio.       Communication Communication Communication: No difficulties   Cognition Arousal/Alertness: Awake/alert Behavior During Therapy: WFL for tasks assessed/performed Overall Cognitive Status: Within Functional Limits for tasks assessed                     General Comments   pt pleasant and cooperative                 Home Living Family/patient expects to be discharged to:: Private residence Living Arrangements: Alone Available Help at Discharge: Friend(s);Available PRN/intermittently Type of Home: House Home Access: Stairs to enter [employer] of Steps: 3 Entrance Stairs-Rails: Right;Left Home Layout: One level     Bathroom Shower/Tub: Occupational psychologist: Handicapped height Bathroom Accessibility: Yes   Home Equipment: Grab bars - tub/shower;Walker - 2 wheels;Cane - single point;Wheelchair - manual          Prior Functioning/Environment Level of Independence: Independent                 OT Problem List: Impaired balance (sitting and/or standing);Pain;Decreased activity tolerance;Decreased knowledge of use of DME or AE      OT Treatment/Interventions: DME and/or AE instruction;Self-care/ADL training;Therapeutic activities;Patient/family education    OT Goals(Current goals can be found in the care plan section) Acute Rehab OT Goals Patient Stated Goal: improve foot pain OT Goal Formulation: With patient Time For Goal Achievement: 08/09/16 Potential to Achieve Goals: Good ADL Goals Pt Will Perform Grooming: with min assist;standing;with caregiver independent in assisting Pt Will Perform Lower Body Bathing: with mod assist;with caregiver independent in assisting;sit to/from stand Pt Will Perform Lower Body Dressing: with mod assist;with caregiver independent in assisting;sit to/from stand Pt Will Transfer to Toilet: with min assist;with min guard  assist;ambulating Pt Will Perform Toileting - Clothing Manipulation and hygiene: with mod assist;with min assist;sit to/from stand;with caregiver independent in assisting  OT Frequency: Min 2X/week   Barriers to D/C: Decreased caregiver support  Pt lives at home alone and children do not live in the area. Pt reports that friends and neighbors can assist, however uncertain of friends and neightbors can provide current level of care       Co-evaluation PT/OT/SLP Co-Evaluation/Treatment: Yes Reason for Co-Treatment: For patient/therapist safety   OT goals addressed during session: ADL's and self-care;Proper use of Adaptive equipment and DME      End of Session Equipment Utilized During Treatment: Gait belt;Rolling walker  Activity Tolerance: Patient limited by pain Patient left: in chair;with call bell/phone within reach  OT Visit Diagnosis: Pain;Unsteadiness on feet (R26.81);Other abnormalities of gait and mobility (R26.89) Pain - Right/Left: Right Pain - part of body: Ankle and joints of foot                ADL either performed or assessed with clinical judgement  Time: 1002-1024 OT Time Calculation (min): 22 min Charges:  OT General Charges $OT Visit: 1 Procedure OT Evaluation $OT Eval Moderate Complexity: 1 Procedure G-Codes: OT G-codes **NOT FOR INPATIENT CLASS** Functional Assessment Tool Used: AM-PAC 6 Clicks Daily Activity   }  Britt Bottom 08/02/2016, 1:02 PM

## 2016-08-02 NOTE — Consult Note (Addendum)
[facility]       Reason for Consult: osteomyelitis of foot    Referring Physician: Dr. [name]  Active Problems:   Syncope   . allopurinol  100 mg Oral Daily  . aspirin EC  81 mg Oral Daily  . atorvastatin  40 mg Oral q1800  . digoxin  0.125 mg Oral Daily  . multivitamin with minerals  1 tablet Oral Daily  . piperacillin-tazobactam (ZOSYN)  IV  3.375 g Intravenous Q8H  . potassium chloride  40 mEq Oral BID  . sodium chloride flush  10-40 mL Intracatheter Q12H  . sodium chloride flush  10-40 mL Intracatheter Q12H  . sodium chloride flush  3 mL Intravenous Q12H  . spironolactone  12.5 mg Oral Daily  . triamcinolone ointment  1 application Topical Daily  . 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  He also has an ulceration around the lateral malleolus but is not contiguous with ulcerative area.  Not clear if this is infectious.  It is associated with exquisite tenderness and sensitivity more c/w a gouty flare and xray changes could be early gout erosions.  Clinically, this acts more like gout.  Uric acid slightly elevated. Heart failure Chronic kidney disease History of gout on allopurinol  Antibiotics: Vancomycin and zosyn  HPI: Jason Choi is a 76 y.o. male with CHF with recent EF of 15%, afib, CKD, CAD who came in with CHF exacerbation and noted cellulitis and ulcer on right foot.  Previous xray without sclerosis or periosteal reactions of second and third metatarsals but now findings noted as above.  No recent fever, chills.  Started on vancomycin and zosyn.  Recent admission for syncope as well.  Has a picc line. Recently had an MRI, no report available.  Foot xray independently reviewed and  periosteal reaction noted.   Review of Systems:  Constitutional: negative for fevers, chills and malaise Gastrointestinal: negative for diarrhea Integument/breast: negative for rash All other systems reviewed and are negative    Past Medical History:  Diagnosis Date  . Anemia    Referral to GI Feb 2013  . Atrial fibrillation ([place])    ON AMIODARONE: PER VISIT NOTE IN [facility]  . Cataract immature    BILATERAL  . Chronic anticoagulation    on coumadin  . Chronic kidney disease (CKD), stage II (mild)   . High risk medication use    on amiodarone  . HTN (hypertension)   . Hypercholesteremia   . LV dysfunction    EF 35 to 40% per echo May 2012; EF remains 35 to 40% per echo Feb 2013. Referred for ICD  . Myocardial infarction   . Osteoarthritis of right shoulder region    ACUTE PAIN  . 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 Pulmonary hypertension, moderate to severe   . Rotator cuff tear 2012  . S/P CABG (coronary artery bypass graft) 2002   SVG to PDA, Free radial to Intermediate, LIMA to LAD by Dr Prescott Gum    Social History  Substance Use Topics  . Smoking status: Former Smoker    Types: Cigarettes    Quit date: 05/12/1979  . Smokeless tobacco: Never Used  Comment: stopped smoking over 30 years ago.  . Alcohol use 8.4 oz/week    14 Cans of beer per week     Comment: couple beers and a shot of [place] most days    Family History  Problem Relation Age of Onset  . Valvular heart disease Father   . Colon cancer Neg Hx   . Colon polyps Neg Hx   . Rectal cancer Neg Hx   . Stomach cancer Neg Hx     Allergies  Allergen Reactions  . Lidocaine Other (See Comments)    Passed out - at the dentist's office  . Procaine Hcl Other (See Comments)    Passed out - at the dentist's office    Physical Exam: Constitutional: in no apparent distress and alert  Vitals:   08/02/16 0700 08/02/16 1100  BP: (!) 115/94 101/66  Pulse: (!) 102 90  Resp: (!) 22  (!) 22  Temp: 98.6 F (37 C) 98.6 F (37 C)   EYES: anicteric ENMT: no thrush Cardiovascular: Cor irreg, irreg RRR Respiratory: CTA B; normal respiratory effort GI: Bowel sounds are normal, liver is not enlarged, spleen is not enlarged Musculoskeletal: no pedal edema noted Skin: negatives: no rash Neuro: non focal  Lab Results  Component Value Date   WBC 9.4 08/02/2016   HGB 9.7 (L) 08/02/2016   HCT 29.5 (L) 08/02/2016   MCV 93.4 08/02/2016   PLT 210 08/02/2016    Lab Results  Component Value Date   CREATININE 2.16 (H) 08/02/2016   BUN 43 (H) 08/02/2016   NA 128 (L) 08/02/2016   K 4.2 08/02/2016   CL 86 (L) 08/02/2016   CO2 28 08/02/2016    Lab Results  Component Value Date   ALT 23 07/27/2016   AST 34 07/27/2016   ALKPHOS 108 07/27/2016     Microbiology: Recent Results (from the past 240 hour(s))  MRSA PCR Screening     Status: None   Collection Time: 07/27/16  6:10 PM  Result Value Ref Range Status   MRSA by PCR NEGATIVE NEGATIVE Final    Comment:        The GeneXpert MRSA Assay (FDA approved for NASAL specimens only), is one component of a comprehensive MRSA colonization surveillance program. It is not intended to diagnose MRSA infection nor to guide or monitor treatment for MRSA infections.     Scharlene Gloss, [facility] [place] [email or web address] [phone] pager  [phone] cell 08/02/2016, 2:32 PM

## 2016-08-02 NOTE — Evaluation (Signed)
Physical Therapy Evaluation Patient Details Name: Jason Choi MRN: [id number] DOB: July 26, 1940 Today's Date: 08/02/2016   History of Present Illness  Pt adm with syncope and acute on chronic heart failure. 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 PMH - chf, polio, cad, ckd, afib  Clinical Impression  Pt admitted with above diagnosis and presents to PT with functional limitations due to deficits listed below (See PT problem list). Pt needs skilled PT to maximize independence and safety to allow discharge to possibly SNF. If rt foot pain improves may be able to return home.     Follow Up Recommendations SNF (if foot pain improves may be able to return home)    Equipment Recommendations  None recommended by PT    Recommendations for Other Services       Precautions / Restrictions Precautions Precautions: Fall Restrictions Weight Bearing Restrictions: No      Mobility  Bed Mobility Overal bed mobility: Needs Assistance Bed Mobility: Sidelying to Sit   Sidelying to sit: Min assist       General bed mobility comments: Assist to elevate trunk into sitting  Transfers Overall transfer level: Needs assistance Equipment used: Rolling walker (2 wheeled) Transfers: Sit to/from [facility] Sit to Stand: +2 physical assistance;Min assist Stand pivot transfers: +2 physical assistance;Min assist       General transfer comment: Assist to bring hips up and for balance. Pt took pivotal steps with walker. Pt limited due to pain in rt foot.  Ambulation/Gait             General Gait Details: Unable due to rt foot pain  Stairs            Wheelchair Mobility    Modified Rankin (Stroke Patients Only)       Balance Overall balance assessment: Needs assistance Sitting-balance support: No upper extremity supported;Feet supported Sitting balance-Leahy Scale: Good     Standing balance support: Bilateral upper extremity  supported Standing balance-Leahy Scale: Poor Standing balance comment: walker and min a for static standing                             Pertinent Vitals/Pain Pain Assessment: 0-10 Pain Score: 7  Pain Location: right foot Pain Descriptors / Indicators: Radiating    Home Living Family/patient expects to be discharged to:: Private residence Living Arrangements: Alone Available Help at Discharge: Friend(s);Available PRN/intermittently Type of Home: House Home Access: Stairs to enter Entrance Stairs-Rails: Psychiatric nurse of Steps: 3 Home Layout: One level Home Equipment: Grab bars - tub/shower;Walker - 2 wheels;Cane - single point;Wheelchair - manual      Prior Function Level of Independence: Independent               Hand Dominance   Dominant Hand: Right    Extremity/Trunk Assessment   Upper Extremity Assessment Upper Extremity Assessment: Defer to OT evaluation    Lower Extremity Assessment Lower Extremity Assessment: Generalized weakness;RLE deficits/detail RLE Deficits / Details: Active movement against gravity but limited function due to pain. Pt also with some residual weakness due to polio.       Communication   Communication: No difficulties  Cognition Arousal/Alertness: Awake/alert Behavior During Therapy: WFL for tasks assessed/performed Overall Cognitive Status: Within Functional Limits for tasks assessed                      General Comments  Exercises     Assessment/Plan    PT Assessment Patient needs continued PT services  PT Problem List Decreased strength;Decreased activity tolerance;Decreased balance;Decreased mobility;Pain       PT Treatment Interventions DME instruction;Gait training;Stair training;Functional mobility training;Therapeutic activities;Therapeutic exercise;Balance training;Patient/family education    PT Goals (Current goals can be found in the Care Plan section)  Acute Rehab  PT Goals Patient Stated Goal: improve foot pain PT Goal Formulation: With patient Time For Goal Achievement: 08/09/16 Potential to Achieve Goals: Good    Frequency Min 3X/week   Barriers to discharge        Co-evaluation               End of Session Equipment Utilized During Treatment: Gait belt Activity Tolerance: Patient limited by pain Patient left: in chair;with call bell/phone within reach Nurse Communication: Mobility status PT Visit Diagnosis: Other abnormalities of gait and mobility (R26.89);Pain Pain - Right/Left: Right Pain - part of body: Ankle and joints of foot         Time: 0761-5183 PT Time Calculation (min) (ACUTE ONLY): 22 min   Charges:   PT Evaluation $PT Eval Moderate Complexity: 1 Procedure     PT G CodesShary Decamp [place] 08/20/2016, 12:45 PM [employer] PT [phone]

## 2016-08-02 NOTE — Consult Note (Addendum)
WOC follow-up: X-ray indicates possible osteomyelitis to foot; this complex medical condition is beyond scope of practice for [facility] nurses.  Please consult ortho service for further plan of care.  Please re-consult if further assistance is needed.  Thank-you,  Julien Girt MSN, [place], [place], [place], [place]

## 2016-08-02 NOTE — Care Management Note (Signed)
Case Management Note  Patient Details  Name: Jason Choi MRN: [id number] Date of Birth: [birth year]  Subjective/Objective:       Pt admitted with HF             Action/Plan:  PTA for home independent (wife passed away 3 weeks).  Pt states he remains independent and declined social work consult for emotional - CM provided emotional support.  Pt has children in [place] but not in same city - states he has strong support from friends in the local area.  Pt states he weighs himself daily and attempts to adhere to low salt diet.  PT/OT ordered.  Pt remains on multiple drips at this time.  CM will continue to follow for discharge needs   Expected Discharge Date:  08/01/16               Expected Discharge Plan:     In-House Referral:     Discharge planning Services  CM Consult  Post Acute Care Choice:    Choice offered to:     DME Arranged:    DME Agency:     HH Arranged:    HH Agency:     Status of Service:  In process, will continue to follow  If discussed at Long Length of Stay Meetings, dates discussed:    Additional Comments:  [name], RN 08/02/2016, 10:52 AM
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
## 2016-08-02 NOTE — Progress Notes (Signed)
ANTICOAGULATION CONSULT NOTE - Follow Up Consult  Pharmacy Consult for Heparin Indication: atrial fibrillation  Allergies  Allergen Reactions  . Lidocaine Other (See Comments)    Passed out - at the dentist's office  . Procaine Hcl Other (See Comments)    Passed out - at the dentist's office    Patient Measurements: Height: 6\' 3"  (190.5 cm) Weight: 210 lb 1.6 oz (95.3 kg) IBW/kg (Calculated) : 84.5  Vital Signs: Temp: 98.6 F (37 C) (03/07 1100) Temp Source: Oral (03/07 1100) BP: 101/66 (03/07 1100) Pulse Rate: 90 (03/07 1100)  Labs:  Recent Labs  07/31/16 0447 07/31/16 1512 08/01/16 0448 08/01/16 0449 08/02/16 0444  HGB 9.3*  --  9.7*  --  9.7*  HCT 27.9*  --  28.9*  --  29.5*  PLT 167  --  181  --  210  LABPROT 19.9*  --   --  18.1* 18.2*  INR 1.67  --   --  1.48 1.49  HEPARINUNFRC 0.30  --   --  0.30 0.25*  CREATININE 2.05* 2.12* 2.02*  --  2.16*    Estimated Creatinine Clearance: 36.4 mL/min (by C-G formula based on SCr of 2.16 mg/dL (H)).     Assessment: 75yom continues on heparin for afib while coumadin on hold. Heparin level fell to 0.25 this am will boost drip rate.  S/p slight nose bleed 3/6 now resolved with removal on [place] - O2 sat maintaining 98%.  CBC stable. INR 1.48. No bleeding. Plan for [facility] once diuresed and renal function stable.  Goal of Therapy:  Heparin level 0.3-0.7 units/ml Monitor platelets by anticoagulation protocol: Yes   Plan:  1) Increase  heparin 1400 units/hr 2) Daily heparin level, CBC  Bonnita Nasuti Pharm.D. CPP, BCPS Clinical Pharmacist [phone] 08/02/2016 1:45 PM

## 2016-08-03 ENCOUNTER — Inpatient Hospital Stay (HOSPITAL_COMMUNITY): Payer: Medicare Other

## 2016-08-03 DIAGNOSIS — M1000 Idiopathic gout, unspecified site: Secondary | ICD-10-CM

## 2016-08-03 LAB — BASIC METABOLIC PANEL
ANION GAP: 13 (ref 5–15)
BUN: 42 mg/dL — ABNORMAL HIGH (ref 6–20)
CALCIUM: 8.8 mg/dL — AB (ref 8.9–10.3)
CO2: 31 mmol/L (ref 22–32)
Chloride: 84 mmol/L — ABNORMAL LOW (ref 101–111)
Creatinine, Ser: 2.16 mg/dL — ABNORMAL HIGH (ref 0.61–1.24)
GFR calc non Af Amer: 29 mL/min — ABNORMAL LOW (ref >60)
GFR, EST AFRICAN AMERICAN: 33 mL/min — AB (ref >60)
Glucose, Bld: 112 mg/dL — ABNORMAL HIGH (ref 65–99)
Potassium: 4.1 mmol/L (ref 3.5–5.1)
SODIUM: 128 mmol/L — AB (ref 135–145)

## 2016-08-03 LAB — COOXEMETRY PANEL
Carboxyhemoglobin: 1.6 % — ABNORMAL HIGH (ref 0.5–1.5)
Methemoglobin: 1 % (ref 0.0–1.5)
O2 Saturation: 71.8 %
TOTAL HEMOGLOBIN: 10.4 g/dL — AB (ref 12.0–16.0)

## 2016-08-03 LAB — PROTIME-INR
INR: 1.48
PROTHROMBIN TIME: 18 s — AB (ref 11.4–15.2)

## 2016-08-03 LAB — HEPARIN LEVEL (UNFRACTIONATED): Heparin Unfractionated: 0.34 IU/mL (ref 0.30–0.70)

## 2016-08-03 LAB — CBC
HEMATOCRIT: 30.3 % — AB (ref 39.0–52.0)
HEMOGLOBIN: 10.1 g/dL — AB (ref 13.0–17.0)
MCH: 30.8 pg (ref 26.0–34.0)
MCHC: 33.3 g/dL (ref 30.0–36.0)
MCV: 92.4 fL (ref 78.0–100.0)
Platelets: 232 10*3/uL (ref 150–400)
RBC: 3.28 MIL/uL — AB (ref 4.22–5.81)
RDW: 16.8 % — ABNORMAL HIGH (ref 11.5–15.5)
WBC: 11.9 10*3/uL — ABNORMAL HIGH (ref 4.0–10.5)

## 2016-08-03 LAB — SEDIMENTATION RATE: Sed Rate: 47 mm/hr — ABNORMAL HIGH (ref 0–16)

## 2016-08-03 MED ORDER — TRAMADOL HCL 50 MG PO TABS
50.0000 mg | ORAL_TABLET | Freq: Two times a day (BID) | ORAL | Status: DC | PRN
  Administered 2016-08-03 – 2016-08-07 (×5): 50 mg via ORAL
  Filled 2016-08-03 (×5): qty 1

## 2016-08-03 MED ORDER — PREDNISONE 20 MG PO TABS
40.0000 mg | ORAL_TABLET | Freq: Every day | ORAL | Status: AC
  Administered 2016-08-03 – 2016-08-05 (×3): 40 mg via ORAL
  Filled 2016-08-03: qty 4
  Filled 2016-08-03 (×2): qty 2

## 2016-08-03 MED ORDER — NOREPINEPHRINE BITARTRATE 1 MG/ML IV SOLN
0.0000 ug/min | INTRAVENOUS | Status: DC
  Administered 2016-08-03: 3 ug/min via INTRAVENOUS
  Filled 2016-08-03: qty 16

## 2016-08-03 MED ORDER — METOLAZONE 5 MG PO TABS
5.0000 mg | ORAL_TABLET | Freq: Two times a day (BID) | ORAL | Status: AC
  Administered 2016-08-03 (×2): 5 mg via ORAL
  Filled 2016-08-03 (×2): qty 1

## 2016-08-03 MED ORDER — HEPARIN (PORCINE) IN NACL 100-0.45 UNIT/ML-% IJ SOLN
1400.0000 [IU]/h | INTRAMUSCULAR | Status: DC
  Administered 2016-08-03 – 2016-08-08 (×6): 1400 [IU]/h via INTRAVENOUS
  Filled 2016-08-03 (×7): qty 250

## 2016-08-03 MED ORDER — COLCHICINE 0.6 MG PO TABS
0.6000 mg | ORAL_TABLET | Freq: Every day | ORAL | Status: DC
  Administered 2016-08-03 – 2016-08-10 (×8): 0.6 mg via ORAL
  Filled 2016-08-03 (×8): qty 1

## 2016-08-03 MED ORDER — TOLVAPTAN 15 MG PO TABS
15.0000 mg | ORAL_TABLET | ORAL | Status: AC
  Administered 2016-08-03: 15 mg via ORAL
  Filled 2016-08-03 (×2): qty 1

## 2016-08-03 NOTE — Care Management Note (Signed)
Case Management Note  Patient Details  Name: Jason Choi MRN: [id number] Date of Birth: December 26, 1940  Subjective/Objective:       Pt admitted with HF             Action/Plan:  PTA for home independent (wife passed away 3 weeks).  Pt states he remains independent and declined social work consult for emotional - CM provided emotional support.  Pt has children in [place] but not in same city - states he has strong support from friends in the local area.  Pt states he weighs himself daily and attempts to adhere to low salt diet.  PT/OT ordered.  Pt remains on multiple drips at this time.  CM will continue to follow for discharge needs   Expected Discharge Date:  08/01/16               Expected Discharge Plan:     In-House Referral:     Discharge planning Services  CM Consult  Post Acute Care Choice:    Choice offered to:     DME Arranged:    DME Agency:     HH Arranged:    HH Agency:     Status of Service:  In process, will continue to follow  If discussed at Long Length of Stay Meetings, dates discussed:    Additional Comments: 08/03/2016 SNF recommended tentatively dependent upon progression -  CSW consulted [name], RN 08/03/2016, 8:56 AM
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
## 2016-08-03 NOTE — Progress Notes (Signed)
ANTICOAGULATION CONSULT NOTE - Follow Up Consult  Pharmacy Consult for Heparin Indication: atrial fibrillation  Allergies  Allergen Reactions  . Lidocaine Other (See Comments)    Passed out - at the dentist's office  . Procaine Hcl Other (See Comments)    Passed out - at the dentist's office    Patient Measurements: Height: 6\' 3"  (190.5 cm) Weight: 201 lb 8 oz (91.4 kg) IBW/kg (Calculated) : 84.5  Vital Signs: Temp: 98 F (36.7 C) (03/08 1140) Temp Source: Oral (03/08 1140) BP: 104/70 (03/08 1140) Pulse Rate: 103 (03/08 1140)  Labs:  Recent Labs  08/01/16 0448 08/01/16 0449 08/02/16 0444 08/03/16 0616  HGB 9.7*  --  9.7* 10.1*  HCT 28.9*  --  29.5* 30.3*  PLT 181  --  210 232  LABPROT  --  18.1* 18.2* 18.0*  INR  --  1.48 1.49 1.48  HEPARINUNFRC  --  0.30 0.25* 0.34  CREATININE 2.02*  --  2.16* 2.16*    Estimated Creatinine Clearance: 36.4 mL/min (by C-G formula based on SCr of 2.16 mg/dL (H)).   Medications: Heparin @ 1400 units/hr   Assessment:  75yom continues on heparin for afib while coumadin on hold. S/p slight nose bleed 3/6 which resolved with removal of nasal cannula, however, this morning he was having hemoptysis/nose bleeds. Heparin level 0.34. Heparin turned off. CBC remained stable. CXR ok.   This afternoon his hemoptysis/nose bleeds have appeared to resolve. Per Darrick Grinder, ok to restart heparin.  Plan for [facility] once diuresed and renal function stable.  Goal of Therapy:  Heparin level 0.3-0.7 units/ml Monitor platelets by anticoagulation protocol: Yes   Plan:  1) Restart heparin at 1400 units/hr 2) Daily heparin level and CBC  Nena Jordan, PharmD, BCPS 08/03/2016 2:25 PM

## 2016-08-03 NOTE — Progress Notes (Signed)
    [place] for Infectious Disease   Reason for visit: Follow up on periosteal reaction  Interval History: continued pain in foot, frustrated with being here, no fever, no chills.  No assoicated n/v/d.    Xray of foot reviewed, old record of previous xray personally reviewed and discussed with radiologist.  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    Physical Exam: Constitutional:  Vitals:   08/03/16 0412 08/03/16 0818  BP: 94/65 111/70  Pulse: 97 91  Resp: 18 (!) 24  Temp: 98.5 F (36.9 C) 98 F (36.7 C)   patient appears in NAD Respiratory: Normal respiratory effort; CTA B Cardiovascular: RRR GI: soft, nt, nd  Review of Systems: Constitutional: negative for fevers and chills Respiratory: negative for cough Gastrointestinal: negative for diarrhea Musculoskeletal: continued foot pain, no change from yesterday  Lab Results  Component Value Date   WBC 11.9 (H) 08/03/2016   HGB 10.1 (L) 08/03/2016   HCT 30.3 (L) 08/03/2016   MCV 92.4 08/03/2016   PLT 232 08/03/2016    Lab Results  Component Value Date   CREATININE 2.16 (H) 08/03/2016   BUN 42 (H) 08/03/2016   NA 128 (L) 08/03/2016   K 4.1 08/03/2016   CL 84 (L) 08/03/2016   CO2 31 08/03/2016    Lab Results  Component Value Date   ALT 23 07/27/2016   AST 34 07/27/2016   ALKPHOS 108 07/27/2016     Microbiology: Recent Results (from the past 240 hour(s))  MRSA PCR Screening     Status: None   Collection Time: 07/27/16  6:10 PM  Result Value Ref Range Status   MRSA by PCR NEGATIVE NEGATIVE Final    Comment:        The GeneXpert MRSA Assay (FDA approved for NASAL specimens only), is one component of a comprehensive MRSA colonization surveillance program. It is not intended to diagnose MRSA infection nor to guide or monitor treatment for MRSA infections.     Impression/Plan:  1. Gout - clinical exam, pain, xray, most c/w this and persistent, not new.   Has been started on treatment by Dr. [name].  I will stop antibiotics. 2. Ankle wound - continue wound care   I will sign off, please call with any questions

## 2016-08-03 NOTE — Progress Notes (Signed)
Physical Therapy Treatment Patient Details Name: Jason Choi MRN: [id number] DOB: 04-11-1941 Today's Date: 08/03/2016    History of Present Illness Pt adm with syncope and acute on chronic heart failure. 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 PMH - chf, polio, cad, ckd, afib    PT Comments    Pt admitted with above diagnosis. Pt currently with functional limitations due to balance and endurance deficits. Pt was unable to ambulate due to right foot pain. Only able to stand pivot to chair with mod assist of 2.  Will need SNF.  Pt will benefit from skilled PT to increase their independence and safety with mobility to allow discharge to the venue listed below.     Follow Up Recommendations  SNF (if foot pain improves may be able to return home)     Equipment Recommendations  None recommended by PT    Recommendations for Other Services       Precautions / Restrictions Precautions Precautions: Fall Restrictions Weight Bearing Restrictions: Yes RLE Weight Bearing: Partial weight bearing Other Position/Activity Restrictions: R foot very red, edematous and painful making it difficult to weight bear    Mobility  Bed Mobility Overal bed mobility: Needs Assistance Bed Mobility: Supine to Sit   Sidelying to sit: Min assist       General bed mobility comments: Assist to elevate trunk into sitting  Transfers Overall transfer level: Needs assistance Equipment used: Rolling walker (2 wheeled) Transfers: Sit to/from [facility] Sit to Stand: Mod assist;+2 physical assistance;From elevated surface Stand pivot transfers: Mod assist;+2 physical assistance;From elevated surface       General transfer comment: Assist to bring hips up and for balance. Pt took pivotal steps with walker. Pt limited due to pain in rt foot. Pt unable to put right foot on floor due to severe pain. Nurse and Md aware.  Pt frustrated as he wants to weight  bear and progress but right foot in excruciating pain.   Ambulation/Gait             General Gait Details: Unable due to rt foot pain   Stairs            Wheelchair Mobility    Modified Rankin (Stroke Patients Only)       Balance Overall balance assessment: Needs assistance;History of Falls Sitting-balance support: No upper extremity supported;Feet supported Sitting balance-Leahy Scale: Good     Standing balance support: Bilateral upper extremity supported;During functional activity Standing balance-Leahy Scale: Poor Standing balance comment: walker and mod a for static standing as right foot pain incr today.  Also pts nose kept bleeding and it was distracting for pt.                     Cognition Arousal/Alertness: Awake/alert Behavior During Therapy: WFL for tasks assessed/performed Overall Cognitive Status: Within Functional Limits for tasks assessed                      Exercises General Exercises - Lower Extremity Ankle Circles/Pumps: AROM;Both;5 reps;Supine Long Arc Quad: AROM;Both;5 reps;Seated Hip Flexion/Marching: AROM;Both;5 reps;Seated    General Comments General comments (skin integrity, edema, etc.): Nose bleeding and got wash cloth per pt request.  Nurse already aware.       Pertinent Vitals/Pain Pain Assessment: Faces Faces Pain Scale: Hurts whole lot Pain Location: right foot Pain Descriptors / Indicators: Aching;Grimacing;Guarding;Radiating Pain Intervention(s): Limited activity within patient's tolerance;Monitored during session;Repositioned;Premedicated before  session   VSS with O2 94% on RA.   Home Living                      Prior Function            PT Goals (current goals can now be found in the care plan section) Progress towards PT goals: Not progressing toward goals - comment (limited by right foot pain)    Frequency    Min 3X/week      PT Plan Current plan remains appropriate     Co-evaluation             End of Session Equipment Utilized During Treatment: Gait belt Activity Tolerance: Patient limited by fatigue;Patient limited by pain Patient left: in chair;with call bell/phone within reach Nurse Communication: Mobility status PT Visit Diagnosis: Other abnormalities of gait and mobility (R26.89);Pain Pain - Right/Left: Right Pain - part of body: Ankle and joints of foot     Time: 0915-0940 PT Time Calculation (min) (ACUTE ONLY): 25 min  Charges:  $Therapeutic Exercise: 8-22 mins $Therapeutic Activity: 8-22 mins                    G Codes:       Godfrey Pick Faustino Luecke 2016-08-27, 1:09 PM [facility] [facility] [phone] (pager)

## 2016-08-03 NOTE — Clinical Social Work Note (Signed)
Clinical Social Work Assessment  Patient Details  Name: Jason Choi MRN: [id number] Date of Birth: 01/08/1941  Date of referral:  08/03/16               Reason for consult:  Facility Placement                Permission sought to share information with:  Facility Art therapist granted to share information::  Yes, Verbal Permission Granted  Name::        Agency::  SNFs  Relationship::     Contact Information:     Housing/Transportation Living arrangements for the past 2 months:  Single Family Home Source of Information:  Patient Patient Interpreter Needed:  None Criminal Activity/Legal Involvement Pertinent to Current Situation/Hospitalization:  No - Comment as needed Significant Relationships:  Adult Children Lives with:  Self Do you feel safe going back to the place where you live?  No Need for family participation in patient care:  No (Coment)  Care giving concerns:  CSW received consult for possible SNF placement at time of discharge. CSW spoke with patient regarding PT recommendation of SNF placement at time of discharge. Patient reported that patient's spouse passed away three weeks ago at [facility]. Given patient's current physical needs and fall risk, he is not sure if he wants to return home with family or go to SNF. CSW to continue to follow and assist with discharge planning needs.   Social Worker assessment / plan:  CSW spoke with patient concerning possibility of rehab at [facility] before returning home.  Employment status:  [employer] information:  Medicare PT Recommendations:  [place] / Referral to community resources:  [place]  Patient/Family's Response to care:  Patient recognizes need for rehab before returning home and may be agreeable to a SNF in [place]. CSW provided SNF list.  Patient/Family's Understanding of and Emotional Response to Diagnosis, Current Treatment, and  Prognosis:  Patient is pleasant and realistic regarding therapy needs and expressed being hopeful that he will "stop having so many lines connected to me that beep". He reported that he still works and hopes to get back to his job soon. Patient expressed understanding of CSW role and discharge process. No questions/concerns about plan or treatment.    Emotional Assessment Appearance:  Appears stated age Attitude/Demeanor/Rapport:  Other (Appropriate) Affect (typically observed):  Accepting, Adaptable, Appropriate, Pleasant Orientation:  Oriented to Self, Oriented to Situation, Oriented to Place, Oriented to  Time Alcohol / Substance use:  Not Applicable Psych involvement (Current and /or in the community):  No (Comment)  Discharge Needs  Concerns to be addressed:  Care Coordination Readmission within the last 30 days:  No Current discharge risk:  None Barriers to Discharge:  Continued Medical Work up   [employer], [place] 08/03/2016, 4:30 PM

## 2016-08-04 LAB — BASIC METABOLIC PANEL
Anion gap: 15 (ref 5–15)
BUN: 47 mg/dL — AB (ref 6–20)
CO2: 30 mmol/L (ref 22–32)
Calcium: 8.2 mg/dL — ABNORMAL LOW (ref 8.9–10.3)
Chloride: 83 mmol/L — ABNORMAL LOW (ref 101–111)
Creatinine, Ser: 2.33 mg/dL — ABNORMAL HIGH (ref 0.61–1.24)
GFR calc non Af Amer: 26 mL/min — ABNORMAL LOW (ref >60)
GFR, EST AFRICAN AMERICAN: 30 mL/min — AB (ref >60)
Glucose, Bld: 206 mg/dL — ABNORMAL HIGH (ref 65–99)
Potassium: 3.9 mmol/L (ref 3.5–5.1)
SODIUM: 128 mmol/L — AB (ref 135–145)

## 2016-08-04 LAB — COOXEMETRY PANEL
CARBOXYHEMOGLOBIN: 1.4 % (ref 0.5–1.5)
METHEMOGLOBIN: 1 % (ref 0.0–1.5)
O2 SAT: 62.8 %
Total hemoglobin: 10.3 g/dL — ABNORMAL LOW (ref 12.0–16.0)

## 2016-08-04 LAB — CBC
HCT: 28.5 % — ABNORMAL LOW (ref 39.0–52.0)
Hemoglobin: 9.7 g/dL — ABNORMAL LOW (ref 13.0–17.0)
MCH: 30.7 pg (ref 26.0–34.0)
MCHC: 34 g/dL (ref 30.0–36.0)
MCV: 90.2 fL (ref 78.0–100.0)
PLATELETS: 263 10*3/uL (ref 150–400)
RBC: 3.16 MIL/uL — AB (ref 4.22–5.81)
RDW: 16.3 % — AB (ref 11.5–15.5)
WBC: 13.7 10*3/uL — ABNORMAL HIGH (ref 4.0–10.5)

## 2016-08-04 LAB — HEPARIN LEVEL (UNFRACTIONATED): Heparin Unfractionated: 0.37 IU/mL (ref 0.30–0.70)

## 2016-08-04 LAB — PROTIME-INR
INR: 1.42
Prothrombin Time: 17.5 seconds — ABNORMAL HIGH (ref 11.4–15.2)

## 2016-08-04 MED ORDER — TORSEMIDE 20 MG PO TABS
80.0000 mg | ORAL_TABLET | Freq: Every day | ORAL | Status: DC
  Administered 2016-08-04 – 2016-08-06 (×3): 80 mg via ORAL
  Filled 2016-08-04 (×3): qty 4

## 2016-08-04 MED ORDER — MILRINONE LACTATE IN DEXTROSE 20-5 MG/100ML-% IV SOLN
0.1250 ug/kg/min | INTRAVENOUS | Status: DC
  Administered 2016-08-04 – 2016-08-06 (×6): 0.375 ug/kg/min via INTRAVENOUS
  Administered 2016-08-07 (×3): 0.25 ug/kg/min via INTRAVENOUS
  Filled 2016-08-04 (×8): qty 100

## 2016-08-04 MED ORDER — TOLVAPTAN 15 MG PO TABS
30.0000 mg | ORAL_TABLET | Freq: Once | ORAL | Status: AC
  Administered 2016-08-04: 30 mg via ORAL
  Filled 2016-08-04: qty 2
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
## 2016-08-04 NOTE — Progress Notes (Signed)
ANTICOAGULATION CONSULT NOTE - Follow Up Consult  Pharmacy Consult for Heparin Indication: atrial fibrillation  Allergies  Allergen Reactions  . Lidocaine Other (See Comments)    Passed out - at the dentist's office  . Procaine Hcl Other (See Comments)    Passed out - at the dentist's office    Patient Measurements: Height: 6\' 3"  (190.5 cm) Weight: 197 lb 4.8 oz (89.5 kg) IBW/kg (Calculated) : 84.5  Vital Signs: Temp: 98 F (36.7 C) (03/09 0814) Temp Source: Oral (03/09 0814) BP: 104/64 (03/09 0814) Pulse Rate: 91 (03/09 0814)  Labs:  Recent Labs  08/02/16 0444 08/03/16 0616 08/04/16 0503  HGB 9.7* 10.1* 9.7*  HCT 29.5* 30.3* 28.5*  PLT 210 232 263  LABPROT 18.2* 18.0* 17.5*  INR 1.49 1.48 1.42  HEPARINUNFRC 0.25* 0.34 0.37  CREATININE 2.16* 2.16* 2.33*    Estimated Creatinine Clearance: 33.7 mL/min (by C-G formula based on SCr of 2.33 mg/dL (H)).   Medications: Heparin @ 1400 units/hr   Assessment:  75yom continues on heparin for afib while coumadin on hold. S/p slight nose bleed 3/6 which resolved with removal of nasal cannula. Epistaxis/hemoptysis from yesterday resolved. Heparin level at goal 0.37. CBC stable. Planning cath Monday.   Goal of Therapy:  Heparin level 0.3-0.7 units/ml Monitor platelets by anticoagulation protocol: Yes   Plan:  1) Continue heparin at 1400 units/hr 2) Daily heparin level and CBC  Nena Jordan, PharmD, BCPS 08/04/2016 9:00 AM

## 2016-08-04 NOTE — Progress Notes (Signed)
Inpatient Diabetes Program Recommendations  AACE/ADA: New Consensus Statement on Inpatient Glycemic Control (2015)  Target Ranges:  Prepandial:   less than 140 mg/dL      Peak postprandial:   less than 180 mg/dL (1-2 hours)      Critically ill patients:  140 - 180 mg/dL     Review of Glycemic ControlResults for Jason Choi, Jason Choi (MRN [id number]) as of 08/04/2016 10:56  Ref. Range 08/02/2016 04:44 08/03/2016 06:16 08/04/2016 05:03  Glucose Latest Ref Range: 65 - 99 mg/dL 225 (H) 112 (H) 206 (H)   Diabetes history: None Current orders for Inpatient glycemic control:  Prednisone 40 mg daily Inpatient Diabetes Program Recommendations:   Note that lab/fasting glucose elevated.  Note that patient is on PO Prednisone.  Consider CBG's tid with meals and HS while patient in on steroids?  If greater than 140 mg/dL, consider adding Novolog moderate correction tid with meals?  Thanks, Adah Perl, RN, BC-ADM Inpatient Diabetes Coordinator Pager [phone] (8a-5p)

## 2016-08-04 NOTE — Care Management Note (Addendum)
Case Management Note  Patient Details  Name: Jason Choi MRN: [id number] Date of Birth: 12/14/1940  Subjective/Objective:       Pt admitted with HF             Action/Plan:  PTA for home independent (wife passed away 3 weeks).  Pt states he remains independent and declined social work consult for emotional - CM provided emotional support.  Pt has children in [place] but not in same city - states he has strong support from friends in the local area.  Pt states he weighs himself daily and attempts to adhere to low salt diet.  PT/OT ordered.  Pt remains on multiple drips at this time.  CM will continue to follow for discharge needs   Expected Discharge Date:  08/01/16               Expected Discharge Plan:     In-House Referral:     Discharge planning Services  CM Consult  Post Acute Care Choice:    Choice offered to:     DME Arranged:    DME Agency:     HH Arranged:    HH Agency:     Status of Service:  In process, will continue to follow  If discussed at Long Length of Stay Meetings, dates discussed:    Additional Comments: 08/04/2016  CM spoke with attending PA and was informed pt will have cardiac cath Monday and Milrinone/Life Vest will be determined post procedure.  Pt will mobilize this weekend - team is in agreement if pt needs to discharge to SNF.  Per attending note; pt may discharge on Milrinone - [facility] made aware tenative referral.  Note also suggested possibility of life vest - CM will contacted Life vest rep will be contacted when referral is given by doctor.  Life vest application on shadow chart.  Pt is scheduled for right heart cath Monday 08/07/16.  CM will continue to follow for discharge needs  08/03/16 SNF recommended tentatively dependent upon progression -  CSW consulted [name], RN 08/04/2016, 8:20 AM

## 2016-08-04 NOTE — Progress Notes (Signed)
OT Cancellation Note  Patient Details Name: Jason Choi MRN: [id number] DOB: 1940/11/06   Cancelled Treatment:    Reason Eval/Treat Not Completed: Other (comment). Pt's lunch just arriving, will check back later as able  Britt Bottom 08/04/2016, 1:02 PM

## 2016-08-04 NOTE — Progress Notes (Signed)
[facility]  Mr. Deller is a new pt for [facility] this hospital admission.  [facility] will follow pt with the HF team to support [facility] care and any home infusion needs upon DC to home.  If patient discharges after hours, please call [phone].   Larry Sierras 08/04/2016, 2:18 PM

## 2016-08-04 NOTE — Progress Notes (Signed)
Physical Therapy Treatment Patient Details Name: Jason Choi MRN: [id number] DOB: 1940-12-10 Today's Date: 08/04/2016    History of Present Illness Pt adm with syncope and acute on chronic heart failure. 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 PMH - chf, polio, cad, ckd, afib    PT Comments    Patient making improvements - able to ambulate 6' x 2 today.  Requiring +2 mod assist to move to standing.  Continues to be limited by pain.     Follow Up Recommendations  SNF (if foot pain improves may be able to return home)     Equipment Recommendations  None recommended by PT    Recommendations for Other Services       Precautions / Restrictions Precautions Precautions: Fall Restrictions Weight Bearing Restrictions: Yes RLE Weight Bearing: Partial weight bearing RLE Partial Weight Bearing Percentage or Pounds:  (None specified)    Mobility  Bed Mobility               General bed mobility comments: Patient sitting in chair  Transfers Overall transfer level: Needs assistance Equipment used: Rolling walker (2 wheeled) Transfers: Sit to/from Stand Sit to Stand: Mod assist;+2 physical assistance         General transfer comment: Verbal cues for hand placement and to scoot to edge of chair.  Required +2 assist to rise from lower chair.  Minimized weight on RLE.    Ambulation/Gait Ambulation/Gait assistance: Min assist;+2 safety/equipment (chair) Ambulation Distance (Feet): 12 Feet (6' x2 with sitting rest break) Assistive device: Rolling walker (2 wheeled) Gait Pattern/deviations: Step-to pattern;Decreased stance time - right;Decreased step length - left;Decreased stride length;Decreased weight shift to right;Antalgic Gait velocity: decreased Gait velocity interpretation: Below normal speed for age/gender General Gait Details: Verbal cues for safe use of RW and PWB on RLE.  Patient able to put minimal weight on RLE today.  Able to  ambulate 6' x2 with seated rest break.  Fatigues quickly, and pain limits mobility.   Stairs            Wheelchair Mobility    Modified Rankin (Stroke Patients Only)       Balance Overall balance assessment: Needs assistance;History of Falls         Standing balance support: Bilateral upper extremity supported;During functional activity Standing balance-Leahy Scale: Poor                      Cognition Arousal/Alertness: Awake/alert Behavior During Therapy: WFL for tasks assessed/performed Overall Cognitive Status: Within Functional Limits for tasks assessed                      Exercises      General Comments        Pertinent Vitals/Pain Pain Assessment: 0-10 Pain Score: 5  Pain Location: right foot Pain Descriptors / Indicators: Aching;Grimacing Pain Intervention(s): Limited activity within patient's tolerance;Monitored during session;Repositioned    Home Living                      Prior Function            PT Goals (current goals can now be found in the care plan section) Acute Rehab PT Goals Patient Stated Goal: improve foot pain Progress towards PT goals: Progressing toward goals    Frequency    Min 3X/week      PT Plan Current plan remains appropriate    Co-evaluation  End of Session Equipment Utilized During Treatment: Gait belt Activity Tolerance: Patient limited by fatigue;Patient limited by pain Patient left: in chair;with call bell/phone within reach;with family/visitor present Nurse Communication: Mobility status (Family asking questions about plan. Deferred to RN.) PT Visit Diagnosis: Other abnormalities of gait and mobility (R26.89);Pain Pain - Right/Left: Right Pain - part of body: Ankle and joints of foot     Time: 9166-0600 PT Time Calculation (min) (ACUTE ONLY): 23 min  Charges:  $Gait Training: 23-37 mins                    G Codes:       Despina Pole 08-27-16, 6:43  PM Carita Pian. Sanjuana Kava, [employer] Pager [phone]

## 2016-08-05 DIAGNOSIS — R570 Cardiogenic shock: Secondary | ICD-10-CM

## 2016-08-05 LAB — COOXEMETRY PANEL
CARBOXYHEMOGLOBIN: 1.3 % (ref 0.5–1.5)
METHEMOGLOBIN: 1 % (ref 0.0–1.5)
O2 SAT: 62.2 %
Total hemoglobin: 9.1 g/dL — ABNORMAL LOW (ref 12.0–16.0)

## 2016-08-05 LAB — PROTIME-INR
INR: 1.36
PROTHROMBIN TIME: 16.9 s — AB (ref 11.4–15.2)

## 2016-08-05 LAB — CBC
HEMATOCRIT: 27.7 % — AB (ref 39.0–52.0)
HEMOGLOBIN: 9.4 g/dL — AB (ref 13.0–17.0)
MCH: 30.8 pg (ref 26.0–34.0)
MCHC: 33.9 g/dL (ref 30.0–36.0)
MCV: 90.8 fL (ref 78.0–100.0)
Platelets: 274 10*3/uL (ref 150–400)
RBC: 3.05 MIL/uL — AB (ref 4.22–5.81)
RDW: 16.4 % — AB (ref 11.5–15.5)
WBC: 10.7 10*3/uL — AB (ref 4.0–10.5)

## 2016-08-05 LAB — BASIC METABOLIC PANEL
Anion gap: 14 (ref 5–15)
BUN: 58 mg/dL — ABNORMAL HIGH (ref 6–20)
CO2: 29 mmol/L (ref 22–32)
CREATININE: 2.52 mg/dL — AB (ref 0.61–1.24)
Calcium: 8.3 mg/dL — ABNORMAL LOW (ref 8.9–10.3)
Chloride: 85 mmol/L — ABNORMAL LOW (ref 101–111)
GFR calc Af Amer: 28 mL/min — ABNORMAL LOW (ref >60)
GFR, EST NON AFRICAN AMERICAN: 24 mL/min — AB (ref >60)
GLUCOSE: 215 mg/dL — AB (ref 65–99)
POTASSIUM: 4.6 mmol/L (ref 3.5–5.1)
SODIUM: 128 mmol/L — AB (ref 135–145)

## 2016-08-05 LAB — HEPARIN LEVEL (UNFRACTIONATED): Heparin Unfractionated: 0.51 IU/mL (ref 0.30–0.70)

## 2016-08-05 LAB — MAGNESIUM: Magnesium: 2.4 mg/dL (ref 1.7–2.4)

## 2016-08-05 MED ORDER — PNEUMOCOCCAL VAC POLYVALENT 25 MCG/0.5ML IJ INJ
0.5000 mL | INJECTION | INTRAMUSCULAR | Status: AC
  Administered 2016-08-09: 0.5 mL via INTRAMUSCULAR
  Filled 2016-08-05: qty 0.5

## 2016-08-05 MED ORDER — CHLORHEXIDINE GLUCONATE CLOTH 2 % EX PADS
6.0000 | MEDICATED_PAD | Freq: Every day | CUTANEOUS | Status: DC
  Administered 2016-08-06 – 2016-08-09 (×3): 6 via TOPICAL

## 2016-08-05 NOTE — Progress Notes (Signed)
ANTICOAGULATION CONSULT NOTE - Follow Up Consult  Pharmacy Consult for Heparin Indication: atrial fibrillation  Allergies  Allergen Reactions  . Lidocaine Other (See Comments)    Passed out - at the dentist's office  . Procaine Hcl Other (See Comments)    Passed out - at the dentist's office    Patient Measurements: Height: 6\' 3"  (190.5 cm) Weight: 192 lb 4.8 oz (87.2 kg) IBW/kg (Calculated) : 84.5  Vital Signs: Temp: 97.8 F (36.6 C) (03/10 0811) Temp Source: Oral (03/10 0811) BP: 117/64 (03/10 1200) Pulse Rate: 94 (03/10 1200)  Labs:  Recent Labs  08/03/16 0616 08/04/16 0503 08/05/16 0500  HGB 10.1* 9.7* 9.4*  HCT 30.3* 28.5* 27.7*  PLT 232 263 274  LABPROT 18.0* 17.5* 16.9*  INR 1.48 1.42 1.36  HEPARINUNFRC 0.34 0.37 0.51  CREATININE 2.16* 2.33* 2.52*    Estimated Creatinine Clearance: 31.2 mL/min (by C-G formula based on SCr of 2.52 mg/dL (H)).    Assessment:  75yom continues on heparin for afib while coumadin on hold. S/p slight nose bleed 3/6 which resolved with removal of nasal cannula. Epistaxis/hemoptysis from yesterday resolved. Heparin level at goal 0.51 heparin drip 1400 uts/hr. CBC stable. Planning cath Monday.   Goal of Therapy:  Heparin level 0.3-0.7 units/ml Monitor platelets by anticoagulation protocol: Yes   Plan:  1) Continue heparin at 1400 units/hr 2) Daily heparin level and CBC  Bonnita Nasuti Pharm.D. CPP, BCPS Clinical Pharmacist [phone] 08/05/2016 12:34 PM
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
## 2016-08-06 ENCOUNTER — Inpatient Hospital Stay (HOSPITAL_COMMUNITY): Payer: Medicare Other

## 2016-08-06 DIAGNOSIS — M8600 Acute hematogenous osteomyelitis, unspecified site: Secondary | ICD-10-CM

## 2016-08-06 DIAGNOSIS — I5023 Acute on chronic systolic (congestive) heart failure: Secondary | ICD-10-CM

## 2016-08-06 LAB — SEDIMENTATION RATE: Sed Rate: 38 mm/hr — ABNORMAL HIGH (ref 0–16)

## 2016-08-06 LAB — CBC
HEMATOCRIT: 28.1 % — AB (ref 39.0–52.0)
Hemoglobin: 9.5 g/dL — ABNORMAL LOW (ref 13.0–17.0)
MCH: 30.6 pg (ref 26.0–34.0)
MCHC: 33.8 g/dL (ref 30.0–36.0)
MCV: 90.6 fL (ref 78.0–100.0)
PLATELETS: 299 10*3/uL (ref 150–400)
RBC: 3.1 MIL/uL — ABNORMAL LOW (ref 4.22–5.81)
RDW: 16.3 % — AB (ref 11.5–15.5)
WBC: 9.5 10*3/uL (ref 4.0–10.5)

## 2016-08-06 LAB — HEPARIN LEVEL (UNFRACTIONATED): Heparin Unfractionated: 0.45 IU/mL (ref 0.30–0.70)

## 2016-08-06 LAB — PROTIME-INR
INR: 1.25
Prothrombin Time: 15.8 seconds — ABNORMAL HIGH (ref 11.4–15.2)

## 2016-08-06 LAB — BASIC METABOLIC PANEL
Anion gap: 10 (ref 5–15)
BUN: 73 mg/dL — AB (ref 6–20)
CHLORIDE: 91 mmol/L — AB (ref 101–111)
CO2: 29 mmol/L (ref 22–32)
Calcium: 8.6 mg/dL — ABNORMAL LOW (ref 8.9–10.3)
Creatinine, Ser: 2.7 mg/dL — ABNORMAL HIGH (ref 0.61–1.24)
GFR calc Af Amer: 25 mL/min — ABNORMAL LOW (ref >60)
GFR calc non Af Amer: 22 mL/min — ABNORMAL LOW (ref >60)
GLUCOSE: 121 mg/dL — AB (ref 65–99)
POTASSIUM: 4.9 mmol/L (ref 3.5–5.1)
Sodium: 130 mmol/L — ABNORMAL LOW (ref 135–145)

## 2016-08-06 LAB — COOXEMETRY PANEL
CARBOXYHEMOGLOBIN: 1.1 % (ref 0.5–1.5)
Methemoglobin: 0.7 % (ref 0.0–1.5)
O2 SAT: 79.6 %
Total hemoglobin: 9.8 g/dL — ABNORMAL LOW (ref 12.0–16.0)

## 2016-08-06 MED ORDER — VANCOMYCIN HCL IN DEXTROSE 1-5 GM/200ML-% IV SOLN
1000.0000 mg | INTRAVENOUS | Status: DC
  Filled 2016-08-06: qty 200

## 2016-08-06 MED ORDER — POTASSIUM CHLORIDE CRYS ER 20 MEQ PO TBCR: 40.0000 meq | EXTENDED_RELEASE_TABLET | Freq: Every day | ORAL | Status: DC

## 2016-08-06 MED ORDER — PIPERACILLIN-TAZOBACTAM 3.375 G IVPB
3.3750 g | Freq: Three times a day (TID) | INTRAVENOUS | Status: DC
  Administered 2016-08-06 – 2016-08-07 (×2): 3.375 g via INTRAVENOUS
  Filled 2016-08-06 (×3): qty 50

## 2016-08-06 MED ORDER — SODIUM CHLORIDE 0.9 % IV SOLN
1750.0000 mg | Freq: Once | INTRAVENOUS | Status: AC
  Administered 2016-08-06: 1750 mg via INTRAVENOUS
  Filled 2016-08-06: qty 1750

## 2016-08-06 NOTE — Progress Notes (Signed)
ANTICOAGULATION CONSULT NOTE - Follow Up Consult  Pharmacy Consult for Heparin Indication: atrial fibrillation  Allergies  Allergen Reactions  . Lidocaine Other (See Comments)    Passed out - at the dentist's office  . Procaine Hcl Other (See Comments)    Passed out - at the dentist's office    Patient Measurements: Height: 6\' 3"  (190.5 cm) Weight: 190 lb 7.6 oz (86.4 kg) IBW/kg (Calculated) : 84.5  Vital Signs: Temp: 97.9 F (36.6 C) (03/11 1214) Temp Source: Oral (03/11 1214) BP: 110/67 (03/11 1214) Pulse Rate: 57 (03/11 0555)  Labs:  Recent Labs  08/04/16 0503 08/05/16 0500 08/06/16 0431 08/06/16 0432  HGB 9.7* 9.4* 9.5*  --   HCT 28.5* 27.7* 28.1*  --   PLT 263 274 299  --   LABPROT 17.5* 16.9* 15.8*  --   INR 1.42 1.36 1.25  --   HEPARINUNFRC 0.37 0.51  --  0.45  CREATININE 2.33* 2.52* 2.70*  --     Estimated Creatinine Clearance: 29.1 mL/min (by C-G formula based on SCr of 2.7 mg/dL (H)).    Assessment:  75yom continues on heparin for afib while coumadin on hold. S/p slight nose bleed 3/6 which resolved with removal of nasal cannula. Epistaxis/hemoptysis from yesterday resolved. Heparin level at goal 0.45 heparin drip 1400 uts/hr. CBC stable. Planning cath Monday.   Goal of Therapy:  Heparin level 0.3-0.7 units/ml Monitor platelets by anticoagulation protocol: Yes   Plan:  1) Continue heparin at 1400 units/hr 2) Daily heparin level and CBC 3) f/u oral AC post cath  Bonnita Nasuti Pharm.D. CPP, BCPS Clinical Pharmacist [phone] 08/06/2016 12:19 PM
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
## 2016-08-06 NOTE — Progress Notes (Signed)
Pharmacy Antibiotic Note  Jason Choi is a 76 y.o. male with RLE cellulitis  Pharmacy had been consulted for Vancomycin and Zosyn  Dosing.  ABX stopped and treated for gout with elevated Uric acid.  Patient still complaining of foot pain and possible sepsis.  Draw BCx, PCT, ESR.   Restart broad spectrum ABX.    Plan: Vancomycin 1750 mg IV, then 1000 mg IV q24h Zosyn 3.375 g IV q8h    Height: 6' 3"  (190.5 cm) Weight: 190 lb 7.6 oz (86.4 kg) IBW/kg (Calculated) : 84.5  Temp (24hrs), Avg:98 F (36.7 C), Min:97.6 F (36.4 C), Max:98.4 F (36.9 C)   Recent Labs Lab 08/02/16 0444 08/03/16 0616 08/04/16 0503 08/05/16 0500 08/06/16 0431  WBC 9.4 11.9* 13.7* 10.7* 9.5  CREATININE 2.16* 2.16* 2.33* 2.52* 2.70*    Estimated Creatinine Clearance: 29.1 mL/min (by C-G formula based on SCr of 2.7 mg/dL (H)).    Allergies  Allergen Reactions  . Lidocaine Other (See Comments)    Passed out - at the dentist's office  . Procaine Hcl Other (See Comments)    Passed out - at the dentist's office    Nadine Counts 08/06/2016 4:34 PM

## 2016-08-07 ENCOUNTER — Encounter (HOSPITAL_COMMUNITY)
Admission: AD | Disposition: A | Discharge status: Home / Self Care | Payer: Self-pay | Source: Ambulatory Visit | Attending: Cardiology

## 2016-08-07 LAB — BASIC METABOLIC PANEL
Anion gap: 11 (ref 5–15)
BUN: 70 mg/dL — AB (ref 6–20)
CALCIUM: 8.6 mg/dL — AB (ref 8.9–10.3)
CHLORIDE: 95 mmol/L — AB (ref 101–111)
CO2: 25 mmol/L (ref 22–32)
CREATININE: 2.52 mg/dL — AB (ref 0.61–1.24)
GFR calc Af Amer: 28 mL/min — ABNORMAL LOW (ref >60)
GFR calc non Af Amer: 24 mL/min — ABNORMAL LOW (ref >60)
GLUCOSE: 82 mg/dL (ref 65–99)
Potassium: 4.5 mmol/L (ref 3.5–5.1)
Sodium: 131 mmol/L — ABNORMAL LOW (ref 135–145)

## 2016-08-07 LAB — CBC
HEMATOCRIT: 30.2 % — AB (ref 39.0–52.0)
Hemoglobin: 9.8 g/dL — ABNORMAL LOW (ref 13.0–17.0)
MCH: 30.3 pg (ref 26.0–34.0)
MCHC: 32.5 g/dL (ref 30.0–36.0)
MCV: 93.5 fL (ref 78.0–100.0)
PLATELETS: 274 10*3/uL (ref 150–400)
RBC: 3.23 MIL/uL — ABNORMAL LOW (ref 4.22–5.81)
RDW: 18.1 % — AB (ref 11.5–15.5)
WBC: 10.4 10*3/uL (ref 4.0–10.5)

## 2016-08-07 LAB — PROTIME-INR
INR: 1.26
Prothrombin Time: 15.9 seconds — ABNORMAL HIGH (ref 11.4–15.2)

## 2016-08-07 LAB — PROCALCITONIN
Procalcitonin: 0.3 ng/mL
Procalcitonin: 0.28 ng/mL

## 2016-08-07 LAB — COOXEMETRY PANEL
CARBOXYHEMOGLOBIN: 1.9 % — AB (ref 0.5–1.5)
METHEMOGLOBIN: 0.7 % (ref 0.0–1.5)
O2 SAT: 57.6 %
TOTAL HEMOGLOBIN: 9.7 g/dL — AB (ref 12.0–16.0)

## 2016-08-07 LAB — HEPARIN LEVEL (UNFRACTIONATED): Heparin Unfractionated: 0.46 IU/mL (ref 0.30–0.70)

## 2016-08-07 SURGERY — RIGHT HEART CATH AND CORONARY ANGIOGRAPHY

## 2016-08-07 MED ORDER — TRAZODONE HCL 50 MG PO TABS: 50.0000 mg | ORAL_TABLET | Freq: Every evening | ORAL | Status: DC | PRN

## 2016-08-07 MED ORDER — AMIODARONE HCL 200 MG PO TABS
400.0000 mg | ORAL_TABLET | Freq: Two times a day (BID) | ORAL | Status: DC
  Administered 2016-08-07 – 2016-08-08 (×4): 400 mg via ORAL
  Filled 2016-08-07 (×4): qty 2
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
## 2016-08-07 NOTE — Progress Notes (Signed)
    [facility] for Infectious Disease   Reason for visit: Follow up on periosteal reaction  Interval History: called to see foot again, concerned about infection over the weekend and vancomycin and zosyn started, no fever, no chills.  Previous xray most c/w gout.  Had an MRI today and c/w tophaceous gout.  Foot feels somewhat better today. WBC wnl MRI personally reviewed and considerable erosions of joints, effusion  Physical Exam: Constitutional:  Vitals:   08/07/16 1000 08/07/16 1214  BP:  102/67  Pulse: 82 80  Resp: (!) 21 (!) 22  Temp:  98.3 F (36.8 C)   patient appears in NAD Respiratory: Normal respiratory effort; CTA B Cardiovascular: RRR MS: still exquisite pain of right foot, no erythema, some warmth at MTP  Review of Systems: Constitutional: negative for fevers and chills Respiratory: negative for cough Musculoskeletal: continued foot pain, better than yesterday  Lab Results  Component Value Date   WBC 10.4 08/07/2016   HGB 9.8 (L) 08/07/2016   HCT 30.2 (L) 08/07/2016   MCV 93.5 08/07/2016   PLT 274 08/07/2016    Lab Results  Component Value Date   CREATININE 2.52 (H) 08/07/2016   BUN 70 (H) 08/07/2016   NA 131 (L) 08/07/2016   K 4.5 08/07/2016   CL 95 (L) 08/07/2016   CO2 25 08/07/2016    Lab Results  Component Value Date   ALT 23 07/27/2016   AST 34 07/27/2016   ALKPHOS 108 07/27/2016     Microbiology: No results found for this or any previous visit (from the past 240 hour(s)).  Impression/Plan:  1. Gout - clinical exam, pain, xray, still most c/w gout and with no erythema but still tender would not be c/w infection and now MRI has confirmed this.   I will stop antibiotics Continue gout treatment with renally adjusted colchicine I will stop allopurinol, in the acute setting, allopurinol can increase an exacerbation; should restart allopurinol once he is off colchicine  2. Ankle wound - continue wound care   Thanks, call with any  questions

## 2016-08-07 NOTE — Care Management (Signed)
1548 08-07-16 CM did fax order form and additional information to [facility] in regards to [employer] authorization for approval.  CM will continue to monitor for additional needs. Bethena Roys, RN,BSN [phone]

## 2016-08-07 NOTE — Progress Notes (Signed)
ANTICOAGULATION CONSULT NOTE - Follow Up Consult  Pharmacy Consult for Heparin Indication: atrial fibrillation  Allergies  Allergen Reactions  . Lidocaine Other (See Comments)    Passed out - at the dentist's office  . Procaine Hcl Other (See Comments)    Passed out - at the dentist's office    Patient Measurements: Height: 6\' 3"  (190.5 cm) Weight: 193 lb 1.6 oz (87.6 kg) IBW/kg (Calculated) : 84.5  Vital Signs: Temp: 98.1 F (36.7 C) (03/12 0745) Temp Source: Oral (03/12 0745) BP: 122/65 (03/12 0800) Pulse Rate: 81 (03/12 0800)  Labs:  Recent Labs  08/05/16 0500 08/06/16 0431 08/06/16 0432 08/07/16 0429 08/07/16 0615  HGB 9.4* 9.5*  --  9.8*  --   HCT 27.7* 28.1*  --  30.2*  --   PLT 274 299  --  274  --   LABPROT 16.9* 15.8*  --  15.9*  --   INR 1.36 1.25  --  1.26  --   HEPARINUNFRC 0.51  --  0.45  --  0.46  CREATININE 2.52* 2.70*  --   --  2.52*    Estimated Creatinine Clearance: 31.2 mL/min (by C-G formula based on SCr of 2.52 mg/dL (H)).    Assessment:  75yom continues on heparin for afib while coumadin on hold. S/p slight nose bleed 3/6 which resolved with removal of nasal cannula. Epistaxis/hemoptysis resolved. Heparin level at goal 0.46 heparin drip 1400 uts/hr. CBC stable. Planning cath this week.   Goal of Therapy:  Heparin level 0.3-0.7 units/ml Monitor platelets by anticoagulation protocol: Yes   Plan:  1) Continue heparin at 1400 units/hr 2) Daily heparin level and CBC 3) f/u oral AC post cath  Bonnita Nasuti Pharm.D. CPP, BCPS Clinical Pharmacist [phone] 08/07/2016 9:21 AM

## 2016-08-07 NOTE — Progress Notes (Signed)
Physical Therapy Treatment Patient Details Name: Jason Choi MRN: [id number] DOB: 1941/05/01 Today's Date: 08/07/2016    History of Present Illness Pt adm with syncope and acute on chronic heart failure. 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 PMH - chf, polio, cad, ckd, afib    PT Comments    Patient seen for mobility progression. Performed in room ambulation with considerable amount of pain. Reinforced use of IS and educated patient on LE activities and positioning for edema control. Continue current POC.    Follow Up Recommendations  SNF (if foot pain improves may be able to return home)     Equipment Recommendations  None recommended by PT    Recommendations for Other Services       Precautions / Restrictions Precautions Precautions: Fall Restrictions Weight Bearing Restrictions: Yes RLE Weight Bearing: Partial weight bearing (self induced) RLE Partial Weight Bearing Percentage or Pounds: right ankle Other Position/Activity Restrictions: R foot very red, edematous and painful making it difficult to weight bear    Mobility  Bed Mobility               General bed mobility comments: received in recliner  Transfers Overall transfer level: Needs assistance Equipment used: Rolling walker (2 wheeled) Transfers: Sit to/from Stand Sit to Stand: Mod assist;+2 physical assistance         General transfer comment: VCs for hand placement, patient educated on RLE foot placement to limit weight bearing.   Ambulation/Gait Ambulation/Gait assistance: Min assist;+2 safety/equipment (chair follow) Ambulation Distance (Feet): 24 Feet Assistive device: Rolling walker (2 wheeled) Gait Pattern/deviations: Step-to pattern;Decreased stance time - right;Decreased step length - left;Decreased stride length;Decreased weight shift to right;Antalgic Gait velocity: decreased Gait velocity interpretation: Below normal speed for  age/gender General Gait Details: Verbal cues for safe use of RW and PWB on RLE.  Patient able to put minimal weight on RLE today.  Able to ambulate 6' x2 with seated rest break.  Fatigues quickly, and pain limits mobility.   Stairs            Wheelchair Mobility    Modified Rankin (Stroke Patients Only)       Balance Overall balance assessment: Needs assistance;History of Falls Sitting-balance support: No upper extremity supported;Feet supported Sitting balance-Leahy Scale: Good     Standing balance support: Bilateral upper extremity supported;During functional activity Standing balance-Leahy Scale: Poor Standing balance comment: heavy reliance on RW to off load RLE, patient reports increased pain in dependent position                    Cognition Arousal/Alertness: Awake/alert Behavior During Therapy: WFL for tasks assessed/performed Overall Cognitive Status: Within Functional Limits for tasks assessed                      Exercises General Exercises - Lower Extremity Ankle Circles/Pumps: AROM;Left    General Comments        Pertinent Vitals/Pain Pain Assessment: 0-10 Pain Score: 7  Pain Location: right foot Pain Descriptors / Indicators: Aching;Grimacing Pain Intervention(s): Limited activity within patient's tolerance;Monitored during session    Home Living                      Prior Function            PT Goals (current goals can now be found in the care plan section) Acute Rehab PT Goals Patient Stated Goal: improve foot pain  PT Goal Formulation: With patient Time For Goal Achievement: 08/09/16 Potential to Achieve Goals: Good Progress towards PT goals: Progressing toward goals    Frequency    Min 3X/week      PT Plan Current plan remains appropriate    Co-evaluation PT/OT/SLP Co-Evaluation/Treatment: Yes Reason for Co-Treatment: For patient/therapist safety;To address functional/ADL transfers PT goals  addressed during session: Mobility/safety with mobility OT goals addressed during session: ADL's and self-care     End of Session Equipment Utilized During Treatment: Gait belt Activity Tolerance: Patient limited by fatigue;Patient limited by pain Patient left: in chair;with call bell/phone within reach;with family/visitor present Nurse Communication: Mobility status PT Visit Diagnosis: Other abnormalities of gait and mobility (R26.89);Pain Pain - Right/Left: Right Pain - part of body: Ankle and joints of foot     Time: 7741-4239 PT Time Calculation (min) (ACUTE ONLY): 29 min  Charges:  $Therapeutic Activity: 8-22 mins                    G Codes:       Duncan Dull 09-03-2016, 11:21 AM Alben Deeds, PT DPT  [phone]

## 2016-08-07 NOTE — Progress Notes (Signed)
Occupational Therapy Treatment Patient Details Name: Jason Choi MRN: [id number] DOB: [birth year] Today's Date: 08/07/2016    History of present illness Pt adm with syncope and acute on chronic heart failure. 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 PMH - chf, polio, cad, ckd, afib   OT comments  Pt is slowly progressing with OT, but is significantly limited by Rt LE pain.   He requires mod A for LB ADLs. And min - mod A +2 for functional mobility   Follow Up Recommendations  SNF    Equipment Recommendations  None recommended by OT    Recommendations for Other Services      Precautions / Restrictions Precautions Precautions: Fall Restrictions Weight Bearing Restrictions: Yes RLE Weight Bearing: Weight bearing as tolerated (self induced) RLE Partial Weight Bearing Percentage or Pounds: right ankle Other Position/Activity Restrictions: No WBing orders located in chart.  Pt is only able to tolerate PWB on Rt foot due to pain.        Mobility Bed Mobility               General bed mobility comments: received in recliner  Transfers Overall transfer level: Needs assistance Equipment used: Rolling walker (2 wheeled) Transfers: Sit to/from Stand Sit to Stand: Mod assist;+2 physical assistance         General transfer comment: VCs for hand placement, patient educated on RLE foot placement to limit weight bearing and reduce pain .     Balance Overall balance assessment: Needs assistance;History of Falls Sitting-balance support: No upper extremity supported;Feet supported Sitting balance-Leahy Scale: Good     Standing balance support: Bilateral upper extremity supported;During functional activity Standing balance-Leahy Scale: Poor Standing balance comment: heavy reliance on RW to off load RLE, patient reports increased pain in dependent position                   ADL Overall ADL's : Needs assistance/impaired                      Lower Body Dressing: Moderate assistance;Sit to/from stand   Toilet Transfer: +2 for safety/equipment;Ambulation;BSC;RW;Moderate assistance   Toileting- Clothing Manipulation and Hygiene: Moderate assistance;Sit to/from stand Toileting - Clothing Manipulation Details (indicate cue type and reason): difficulty unweighting UE to perform clothing management in standing      Functional mobility during ADLs: +2 for safety/equipment;Rolling walker;Moderate assistance General ADL Comments: Pt limited by pain       Vision                     Perception     Praxis      Cognition   Behavior During Therapy: WFL for tasks assessed/performed Overall Cognitive Status: Within Functional Limits for tasks assessed                         Exercises General Exercises - Lower Extremity Ankle Circles/Pumps: AROM;Left   Shoulder Instructions       General Comments      Pertinent Vitals/ Pain       Pain Assessment: 0-10 Pain Score: 7  Pain Location: right foot Pain Descriptors / Indicators: Aching;Grimacing Pain Intervention(s): Limited activity within patient's tolerance;Monitored during session;Repositioned  Home Living  Prior Functioning/Environment              Frequency  Min 2X/week        Progress Toward Goals  OT Goals(current goals can now be found in the care plan section)  Progress towards OT goals: Progressing toward goals  Acute Rehab OT Goals Patient Stated Goal: improve foot pain  Plan Discharge plan remains appropriate    Co-evaluation    PT/OT/SLP Co-Evaluation/Treatment: Yes Reason for Co-Treatment: For patient/therapist safety PT goals addressed during session: Mobility/safety with mobility OT goals addressed during session: ADL's and self-care      End of Session Equipment Utilized During Treatment: Gait belt;Rolling walker  OT Visit Diagnosis:  Pain Pain - Right/Left: Right Pain - part of body: Ankle and joints of foot   Activity Tolerance Patient limited by pain   Patient Left in chair;with call bell/phone within reach   Nurse Communication Mobility status        Time: 4166-0630 OT Time Calculation (min): 29 min  Charges: OT General Charges $OT Visit: 1 Procedure OT Treatments $Therapeutic Activity: 8-22 mins  [facility], OTR/L [phone]    Lucille Passy M 08/07/2016, 12:27 PM

## 2016-08-08 ENCOUNTER — Encounter (HOSPITAL_COMMUNITY): Payer: Self-pay | Admitting: Cardiology

## 2016-08-08 ENCOUNTER — Encounter (HOSPITAL_COMMUNITY)
Admission: AD | Disposition: A | Discharge status: Home / Self Care | Payer: Self-pay | Source: Ambulatory Visit | Attending: Cardiology

## 2016-08-08 DIAGNOSIS — I482 Chronic atrial fibrillation: Secondary | ICD-10-CM

## 2016-08-08 DIAGNOSIS — N179 Acute kidney failure, unspecified: Secondary | ICD-10-CM

## 2016-08-08 DIAGNOSIS — I509 Heart failure, unspecified: Secondary | ICD-10-CM

## 2016-08-08 DIAGNOSIS — I5021 Acute systolic (congestive) heart failure: Secondary | ICD-10-CM

## 2016-08-08 HISTORY — PX: RIGHT HEART CATH: CATH118263

## 2016-08-08 LAB — BASIC METABOLIC PANEL
Anion gap: 10 (ref 5–15)
BUN: 58 mg/dL — AB (ref 6–20)
CO2: 26 mmol/L (ref 22–32)
CREATININE: 2.21 mg/dL — AB (ref 0.61–1.24)
Calcium: 8.7 mg/dL — ABNORMAL LOW (ref 8.9–10.3)
Chloride: 98 mmol/L — ABNORMAL LOW (ref 101–111)
GFR calc non Af Amer: 28 mL/min — ABNORMAL LOW (ref >60)
GFR, EST AFRICAN AMERICAN: 32 mL/min — AB (ref >60)
GLUCOSE: 83 mg/dL (ref 65–99)
Potassium: 3.8 mmol/L (ref 3.5–5.1)
Sodium: 134 mmol/L — ABNORMAL LOW (ref 135–145)

## 2016-08-08 LAB — COOXEMETRY PANEL
Carboxyhemoglobin: 2.2 % — ABNORMAL HIGH (ref 0.5–1.5)
Carboxyhemoglobin: 2 % — ABNORMAL HIGH (ref 0.5–1.5)
METHEMOGLOBIN: 0.9 % (ref 0.0–1.5)
Methemoglobin: 0.7 % (ref 0.0–1.5)
O2 Saturation: 70.4 %
O2 Saturation: 77.6 %
TOTAL HEMOGLOBIN: 9.2 g/dL — AB (ref 12.0–16.0)
Total hemoglobin: 10.7 g/dL — ABNORMAL LOW (ref 12.0–16.0)

## 2016-08-08 LAB — POCT I-STAT 3, VENOUS BLOOD GAS (G3P V)
ACID-BASE EXCESS: 1 mmol/L (ref 0.0–2.0)
ACID-BASE EXCESS: 2 mmol/L (ref 0.0–2.0)
BICARBONATE: 26.2 mmol/L (ref 20.0–28.0)
Bicarbonate: 26.7 mmol/L (ref 20.0–28.0)
O2 SAT: 67 %
O2 Saturation: 65 %
PCO2 VEN: 42 mmHg — AB (ref 44.0–60.0)
PH VEN: 7.408 (ref 7.250–7.430)
TCO2: 27 mmol/L (ref 0–100)
TCO2: 28 mmol/L (ref 0–100)
pCO2, Ven: 41.5 mmHg — ABNORMAL LOW (ref 44.0–60.0)
pH, Ven: 7.411 (ref 7.250–7.430)
pO2, Ven: 35 mmHg (ref 32.0–45.0)
pO2, Ven: 34 mmHg (ref 32.0–45.0)

## 2016-08-08 LAB — CBC
HCT: 29.5 % — ABNORMAL LOW (ref 39.0–52.0)
Hemoglobin: 9.8 g/dL — ABNORMAL LOW (ref 13.0–17.0)
MCH: 30.4 pg (ref 26.0–34.0)
MCHC: 33.2 g/dL (ref 30.0–36.0)
MCV: 91.6 fL (ref 78.0–100.0)
Platelets: 330 10*3/uL (ref 150–400)
RBC: 3.22 MIL/uL — ABNORMAL LOW (ref 4.22–5.81)
RDW: 16.7 % — AB (ref 11.5–15.5)
WBC: 10.6 10*3/uL — AB (ref 4.0–10.5)

## 2016-08-08 LAB — PROTIME-INR
INR: 1.34
PROTHROMBIN TIME: 16.7 s — AB (ref 11.4–15.2)

## 2016-08-08 LAB — HEPARIN LEVEL (UNFRACTIONATED): HEPARIN UNFRACTIONATED: 0.56 [IU]/mL (ref 0.30–0.70)

## 2016-08-08 LAB — PROCALCITONIN: Procalcitonin: 0.18 ng/mL

## 2016-08-08 SURGERY — RIGHT HEART CATH

## 2016-08-08 MED ORDER — ASPIRIN 81 MG PO CHEW: 81.0000 mg | CHEWABLE_TABLET | ORAL | Status: DC

## 2016-08-08 MED ORDER — ACETAMINOPHEN 325 MG PO TABS
650.0000 mg | ORAL_TABLET | ORAL | Status: DC | PRN
  Administered 2016-08-10: 650 mg via ORAL
  Filled 2016-08-08: qty 2

## 2016-08-08 MED ORDER — SODIUM CHLORIDE 0.9 % IV SOLN
INTRAVENOUS | Status: DC
  Administered 2016-08-08: 06:00:00 via INTRAVENOUS

## 2016-08-08 MED ORDER — MIDAZOLAM HCL 2 MG/2ML IJ SOLN
INTRAMUSCULAR | Status: AC
  Filled 2016-08-08: qty 2

## 2016-08-08 MED ORDER — SODIUM CHLORIDE 0.9 % IV SOLN: 250.0000 mL | INTRAVENOUS | Status: DC | PRN

## 2016-08-08 MED ORDER — HEPARIN (PORCINE) IN NACL 100-0.45 UNIT/ML-% IJ SOLN
1400.0000 [IU]/h | INTRAMUSCULAR | Status: DC
  Administered 2016-08-10: 1400 [IU]/h via INTRAVENOUS
  Filled 2016-08-08 (×2): qty 250

## 2016-08-08 MED ORDER — SODIUM CHLORIDE 0.9% FLUSH: 3.0000 mL | INTRAVENOUS | Status: DC | PRN

## 2016-08-08 MED ORDER — HEPARIN (PORCINE) IN NACL 2-0.9 UNIT/ML-% IJ SOLN
INTRAMUSCULAR | Status: AC
  Filled 2016-08-08: qty 1000

## 2016-08-08 MED ORDER — ASPIRIN EC 81 MG PO TBEC
81.0000 mg | DELAYED_RELEASE_TABLET | Freq: Every day | ORAL | Status: DC
  Administered 2016-08-09 – 2016-08-10 (×2): 81 mg via ORAL
  Filled 2016-08-08 (×2): qty 1

## 2016-08-08 MED ORDER — MIDAZOLAM HCL 2 MG/2ML IJ SOLN
INTRAMUSCULAR | Status: DC | PRN
  Administered 2016-08-08: 1 mg via INTRAVENOUS

## 2016-08-08 MED ORDER — WARFARIN SODIUM 7.5 MG PO TABS
7.5000 mg | ORAL_TABLET | Freq: Once | ORAL | Status: DC
  Filled 2016-08-08: qty 1

## 2016-08-08 MED ORDER — FENTANYL CITRATE (PF) 100 MCG/2ML IJ SOLN
INTRAMUSCULAR | Status: DC | PRN
Start: 2016-08-08 — End: 2016-08-08
  Administered 2016-08-08: 50 ug via INTRAVENOUS

## 2016-08-08 MED ORDER — DIGOXIN 125 MCG PO TABS
0.1250 mg | ORAL_TABLET | Freq: Every day | ORAL | Status: DC
  Administered 2016-08-08 – 2016-08-10 (×3): 0.125 mg via ORAL
  Filled 2016-08-08 (×3): qty 1

## 2016-08-08 MED ORDER — HEPARIN (PORCINE) IN NACL 2-0.9 UNIT/ML-% IJ SOLN
INTRAMUSCULAR | Status: DC | PRN
  Administered 2016-08-08: 1000 mL

## 2016-08-08 MED ORDER — FENTANYL CITRATE (PF) 100 MCG/2ML IJ SOLN
INTRAMUSCULAR | Status: AC
  Filled 2016-08-08: qty 2

## 2016-08-08 MED ORDER — TORSEMIDE 20 MG PO TABS
60.0000 mg | ORAL_TABLET | Freq: Every day | ORAL | Status: DC
  Administered 2016-08-08 – 2016-08-10 (×3): 60 mg via ORAL
  Filled 2016-08-08 (×3): qty 3

## 2016-08-08 MED ORDER — SODIUM CHLORIDE 0.9% FLUSH
3.0000 mL | INTRAVENOUS | Status: DC | PRN
  Administered 2016-08-09: 3 mL via INTRAVENOUS
  Filled 2016-08-08: qty 3

## 2016-08-08 MED ORDER — SODIUM CHLORIDE 0.9% FLUSH: 3.0000 mL | Freq: Two times a day (BID) | INTRAVENOUS | Status: DC

## 2016-08-08 MED ORDER — LIDOCAINE HCL (PF) 1 % IJ SOLN
INTRAMUSCULAR | Status: DC | PRN
  Administered 2016-08-08: 10 mL via SUBCUTANEOUS

## 2016-08-08 MED ORDER — LIDOCAINE HCL (PF) 1 % IJ SOLN
INTRAMUSCULAR | Status: AC
  Filled 2016-08-08: qty 30

## 2016-08-08 MED ORDER — ONDANSETRON HCL 4 MG/2ML IJ SOLN
4.0000 mg | Freq: Four times a day (QID) | INTRAMUSCULAR | Status: DC | PRN
Start: 2016-08-08 — End: 2016-08-10
  Administered 2016-08-08: 4 mg via INTRAVENOUS
  Filled 2016-08-08: qty 2

## 2016-08-08 MED ORDER — ASPIRIN 81 MG PO CHEW
81.0000 mg | CHEWABLE_TABLET | ORAL | Status: AC
Start: 2016-08-08 — End: 2016-08-08
  Administered 2016-08-08: 81 mg via ORAL
  Filled 2016-08-08: qty 1

## 2016-08-08 MED ORDER — WARFARIN - PHARMACIST DOSING INPATIENT
Freq: Every day | Status: DC
Start: 2016-08-08 — End: 2016-08-10

## 2016-08-08 MED ORDER — SODIUM CHLORIDE 0.9 % IV SOLN: INTRAVENOUS | Status: DC

## 2016-08-08 MED ORDER — SODIUM CHLORIDE 0.9% FLUSH
3.0000 mL | Freq: Two times a day (BID) | INTRAVENOUS | Status: DC
  Administered 2016-08-08 – 2016-08-09 (×2): 3 mL via INTRAVENOUS

## 2016-08-08 SURGICAL SUPPLY — 8 items
CATH SWAN GANZ 7F STRAIGHT (CATHETERS) ×2 IMPLANT
KIT HEART RIGHT NAMIC (KITS) ×2 IMPLANT
PACK CARDIAC CATHETERIZATION (CUSTOM PROCEDURE TRAY) ×3 IMPLANT
PROTECTION STATION PRESSURIZED (MISCELLANEOUS) ×3
SHEATH PINNACLE 7F 10CM (SHEATH) ×2 IMPLANT
STATION PROTECTION PRESSURIZED (MISCELLANEOUS) IMPLANT
TRANSDUCER W/STOPCOCK (MISCELLANEOUS) ×3 IMPLANT
WIRE EMERALD 3MM-J .025X260CM (WIRE) ×3 IMPLANT
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
## 2016-08-08 NOTE — Progress Notes (Signed)
ANTICOAGULATION CONSULT NOTE - Follow Up Consult  Pharmacy Consult for Heparin Indication: atrial fibrillation  Allergies  Allergen Reactions  . Lidocaine Other (See Comments)    Passed out - at the dentist's office  . Procaine Hcl Other (See Comments)    Passed out - at the dentist's office    Patient Measurements: Height: 6\' 3"  (190.5 cm) Weight: 193 lb 1.6 oz (87.6 kg) IBW/kg (Calculated) : 84.5  Vital Signs: Temp: 97.6 F (36.4 C) (03/13 1100) Temp Source: Oral (03/13 1100) BP: 116/90 (03/13 1200) Pulse Rate: 69 (03/13 1200)  Labs:  Recent Labs  08/06/16 0431 08/06/16 0432 08/07/16 0429 08/07/16 0615 08/08/16 0705 08/08/16 0830  HGB 9.5*  --  9.8*  --   --  9.8*  HCT 28.1*  --  30.2*  --   --  29.5*  PLT 299  --  274  --   --  330  LABPROT 15.8*  --  15.9*  --  16.7*  --   INR 1.25  --  1.26  --  1.34  --   HEPARINUNFRC  --  0.45  --  0.46 0.56  --   CREATININE 2.70*  --   --  2.52* 2.21*  --     Estimated Creatinine Clearance: 35.6 mL/min (by C-G formula based on SCr of 2.21 mg/dL (H)).    Assessment:  75yom on heparin for afib while coumadin on hold. S/p slight nose bleed 3/6 which resolved with removal of nasal cannula. Epistaxis/hemoptysis resolved. Heparin level at goal 0.56 heparin drip 1400 uts/hr. CBC stable. Heparin turned off this am for cath - restarrt this afternoon for bridge to therapeatuic INR 2-3.    Goal of Therapy:  Heparin level 0.3-0.7 units/ml Monitor platelets by anticoagulation protocol: Yes   Plan:   restart heparin at 1400 units/hr at 4pm Warfarin 7.5mg  x1 tonight   Daily heparin level/ INR and CBC  Bonnita Nasuti Pharm.D. CPP, BCPS Clinical Pharmacist [phone] 08/08/2016 3:20 PM

## 2016-08-08 NOTE — Care Management Note (Signed)
Case Management Note  Patient Details  Name: Jason Choi MRN: [id number] Date of Birth: 1940/08/08  Subjective/Objective:       Pt admitted with HF             Action/Plan:  PTA for home independent (wife passed away 3 weeks).  Pt states he remains independent and declined social work consult for emotional - CM provided emotional support.  Pt has children in [place] but not in same city - states he has strong support from friends in the local area.  Pt states he weighs himself daily and attempts to adhere to low salt diet.  PT/OT ordered.  Pt remains on multiple drips at this time.  CM will continue to follow for discharge needs   Expected Discharge Date:  08/01/16               Expected Discharge Plan:     In-House Referral:     Discharge planning Services  CM Consult  Post Acute Care Choice:    Choice offered to:     DME Arranged:    DME Agency:     HH Arranged:    HH Agency:     Status of Service:  In process, will continue to follow  If discussed at Long Length of Stay Meetings, dates discussed:    Additional Comments: 08/08/2016  Orders written for Life Vest - form faxed to [employer] by CM [facility] 08/07/16 - awaiting insurance approval.  PT has recommended SNF - CSW consulted and informed that pt will need to discharge to a facility that accepts both Milrinone and Life Vest  08/04/16 CM spoke with attending PA and was informed pt will have cardiac cath Monday and Milrinone/Life Vest will be determined post procedure.  Pt will mobilize this weekend - team is in agreement if pt needs to discharge to SNF.  Per attending note; pt may discharge on Milrinone - [facility] made aware tenative referral.  Note also suggested possibility of life vest - CM will contacted Life vest rep will be contacted when referral is given by doctor.  Life vest application on shadow chart.  Pt is scheduled for right heart cath Monday 08/07/16.  CM will continue to follow for discharge needs  08/03/16 SNF recommended  tentatively dependent upon progression -  CSW consulted [name], RN 08/08/2016, 8:56 AM

## 2016-08-08 NOTE — Progress Notes (Signed)
Afternoon coox on 0.125 mcg/kg/min of milrinone is 77%.   Will stop milrinone and follow coox in am.    Beryle Beams" [place], [place] 08/08/2016 3:48 PM

## 2016-08-08 NOTE — NC FL2 (Signed)
[place] LEVEL OF CARE SCREENING TOOL     IDENTIFICATION  Patient Name: Jason Choi Birthdate: [birth year] Sex: male Admission Date (Current Location): 07/27/2016  [facility] and [place] Number:  Herbalist and Address:  [facility], [place] [address], [place], [place] [address]      Provider Number: [id number]  Attending Physician Name and Address:  Larey Dresser, MD  Relative Name and Phone Number:       Current Level of Care: Hospital Recommended Level of Care: [place] Prior Approval Number:    Date Approved/Denied:   PASRR Number: [id number]  Discharge Plan: SNF    Current Diagnoses: Patient Active Problem List   Diagnosis Date Noted  . Syncope 07/26/2016  . Encounter for therapeutic drug monitoring 07/08/2013  . Hyponatremia 01/03/2012  . CKD (chronic kidney disease) stage 3, GFR 30-59 ml/min   . Anemia   . CAD (coronary artery disease)   . HTN (hypertension)   . Hypercholesteremia   . Chronic anticoagulation   . Chronic systolic CHF (congestive heart failure) ([place])   . Atrial fibrillation ([place]) 08/22/2010    Orientation RESPIRATION BLADDER Height & Weight     Self, Time, Situation, Place  Normal Continent Weight: 87.6 kg (193 lb 1.6 oz) Height:  6\' 3"  (190.5 cm)  BEHAVIORAL SYMPTOMS/MOOD NEUROLOGICAL BOWEL NUTRITION STATUS      Continent Diet (Please see DC Summary)  AMBULATORY STATUS COMMUNICATION OF NEEDS Skin   Extensive Assist Verbally Other (Comment) (Ulcer on foot)                       Personal Care Assistance Level of Assistance  Bathing, Feeding, Dressing Bathing Assistance: Maximum assistance Feeding assistance: Independent Dressing Assistance: Limited assistance     Functional Limitations Info             SPECIAL CARE FACTORS FREQUENCY  PT (By licensed PT)     PT Frequency: 5x/week              Contractures      Additional Factors Info  Code  Status, Allergies Code Status Info: Full Allergies Info: Lidocaine, Procaine Hcl           Current Medications (08/08/2016):  This is the current hospital active medication list Current Facility-Administered Medications  Medication Dose Route Frequency Provider Last Rate Last Dose  . 0.9 %  sodium chloride infusion  250 mL Intravenous PRN Satira Mccallum Tillery, PA-C      . 0.9 %  sodium chloride infusion  250 mL Intravenous PRN Larey Dresser, MD      . acetaminophen (TYLENOL) tablet 650 mg  650 mg Oral Q4H PRN Larey Dresser, MD      . amiodarone (PACERONE) tablet 400 mg  400 mg Oral BID Larey Dresser, MD   400 mg at 08/08/16 0913  . [START ON 08/09/2016] aspirin EC tablet 81 mg  81 mg Oral Daily Larey Dresser, MD      . atorvastatin (LIPITOR) tablet 40 mg  40 mg Oral q1800 Satira Mccallum Tillery, PA-C   40 mg at 08/07/16 1717  . Chlorhexidine Gluconate Cloth 2 % PADS 6 each  6 each Topical Q0600 Larey Dresser, MD   6 each at 08/08/16 0535  . colchicine tablet 0.6 mg  0.6 mg Oral Daily Amy D Clegg, NP   0.6 mg at 08/08/16 0913  . guaiFENesin-dextromethorphan (ROBITUSSIN DM)  100-10 MG/5ML syrup 5 mL  5 mL Oral Q4H PRN Larey Dresser, MD   5 mL at 08/02/16 2340  . heparin ADULT infusion 100 units/mL (25000 units/219mL sodium chloride 0.45%)  1,400 Units/hr Intravenous Continuous Amy D Clegg, NP 14 mL/hr at 08/08/16 0300 1,400 Units/hr at 08/08/16 0300  . milrinone (PRIMACOR) 20 MG/100 ML (0.2 mg/mL) infusion  0.25 mcg/kg/min Intravenous Continuous Larey Dresser, MD 6.7 mL/hr at 08/07/16 2338 0.25 mcg/kg/min at 08/07/16 2338  . multivitamin with minerals tablet 1 tablet  1 tablet Oral Daily Shirley Friar, [place]   1 tablet at 08/08/16 0913  . ondansetron (ZOFRAN) injection 4 mg  4 mg Intravenous Q6H PRN Larey Dresser, MD      . pneumococcal 23 valent vaccine (PNU-IMMUNE) injection 0.5 mL  0.5 mL Intramuscular Tomorrow-1000 Larey Dresser, MD      . sodium chloride flush (NS)  0.9 % injection 10-40 mL  10-40 mL Intracatheter Q12H Larey Dresser, MD   10 mL at 08/06/16 0921  . sodium chloride flush (NS) 0.9 % injection 10-40 mL  10-40 mL Intracatheter PRN Larey Dresser, MD      . sodium chloride flush (NS) 0.9 % injection 10-40 mL  10-40 mL Intracatheter Q12H Larey Dresser, MD   10 mL at 08/07/16 2105  . sodium chloride flush (NS) 0.9 % injection 10-40 mL  10-40 mL Intracatheter PRN Larey Dresser, MD   10 mL at 08/03/16 0959  . sodium chloride flush (NS) 0.9 % injection 3 mL  3 mL Intravenous Q12H Shirley Friar, PA-C   3 mL at 08/07/16 2105  . sodium chloride flush (NS) 0.9 % injection 3 mL  3 mL Intravenous PRN Satira Mccallum Tillery, PA-C      . sodium chloride flush (NS) 0.9 % injection 3 mL  3 mL Intravenous Q12H Larey Dresser, MD      . sodium chloride flush (NS) 0.9 % injection 3 mL  3 mL Intravenous PRN Larey Dresser, MD      . traMADol Veatrice Bourbon) tablet 50 mg  50 mg Oral Q12H PRN Conrad Lancaster, NP   50 mg at 08/07/16 2105  . traZODone (DESYREL) tablet 50 mg  50 mg Oral QHS PRN Larey Dresser, MD      . triamcinolone ointment (KENALOG) 0.5 % 1 application  1 application Topical Daily Shirley Friar, [place]   1 application at 81/38/87 1700     Discharge Medications: Please see discharge summary for a list of discharge medications.  Relevant Imaging Results:  Relevant Lab Results:   Additional Information SSN: [id number]  Will be on Milrinone drip and have a life vest  Benard Halsted, LCSWA

## 2016-08-08 NOTE — Interval H&P Note (Signed)
History and Physical Interval Note:  08/08/2016 7:52 AM  Jason Choi  has presented today for surgery, with the diagnosis of hf  The various methods of treatment have been discussed with the patient and family. After consideration of risks, benefits and other options for treatment, the patient has consented to  Procedure(s): Right Heart Cath (N/A) as a surgical intervention .  The patient's history has been reviewed, patient examined, no change in status, stable for surgery.  I have reviewed the patient's chart and labs.  Questions were answered to the patient's satisfaction.     [name]
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
## 2016-08-09 LAB — CBC
HEMATOCRIT: 31.7 % — AB (ref 39.0–52.0)
Hemoglobin: 10.4 g/dL — ABNORMAL LOW (ref 13.0–17.0)
MCH: 30.7 pg (ref 26.0–34.0)
MCHC: 32.8 g/dL (ref 30.0–36.0)
MCV: 93.5 fL (ref 78.0–100.0)
PLATELETS: 311 10*3/uL (ref 150–400)
RBC: 3.39 MIL/uL — ABNORMAL LOW (ref 4.22–5.81)
RDW: 16.8 % — AB (ref 11.5–15.5)
WBC: 10.6 10*3/uL — AB (ref 4.0–10.5)

## 2016-08-09 LAB — BASIC METABOLIC PANEL
Anion gap: 12 (ref 5–15)
BUN: 55 mg/dL — ABNORMAL HIGH (ref 6–20)
CALCIUM: 8.8 mg/dL — AB (ref 8.9–10.3)
CHLORIDE: 96 mmol/L — AB (ref 101–111)
CO2: 27 mmol/L (ref 22–32)
CREATININE: 2.19 mg/dL — AB (ref 0.61–1.24)
GFR calc Af Amer: 33 mL/min — ABNORMAL LOW (ref >60)
GFR calc non Af Amer: 28 mL/min — ABNORMAL LOW (ref >60)
Glucose, Bld: 77 mg/dL (ref 65–99)
Potassium: 4.1 mmol/L (ref 3.5–5.1)
SODIUM: 135 mmol/L (ref 135–145)

## 2016-08-09 LAB — COOXEMETRY PANEL
Carboxyhemoglobin: 2.5 % — ABNORMAL HIGH (ref 0.5–1.5)
METHEMOGLOBIN: 0.8 % (ref 0.0–1.5)
O2 SAT: 62.2 %
Total hemoglobin: 11.3 g/dL — ABNORMAL LOW (ref 12.0–16.0)

## 2016-08-09 LAB — PROTIME-INR
INR: 1.29
Prothrombin Time: 16.2 seconds — ABNORMAL HIGH (ref 11.4–15.2)

## 2016-08-09 LAB — HEPARIN LEVEL (UNFRACTIONATED): Heparin Unfractionated: 0.45 IU/mL (ref 0.30–0.70)

## 2016-08-09 MED ORDER — WARFARIN SODIUM 7.5 MG PO TABS
7.5000 mg | ORAL_TABLET | Freq: Once | ORAL | Status: AC
  Administered 2016-08-09: 7.5 mg via ORAL
  Filled 2016-08-09 (×2): qty 1

## 2016-08-09 MED ORDER — SPIRONOLACTONE 25 MG PO TABS
12.5000 mg | ORAL_TABLET | Freq: Every day | ORAL | Status: DC
  Administered 2016-08-09 – 2016-08-10 (×2): 12.5 mg via ORAL
  Filled 2016-08-09 (×2): qty 1

## 2016-08-09 MED ORDER — METOPROLOL SUCCINATE ER 25 MG PO TB24
25.0000 mg | ORAL_TABLET | Freq: Every day | ORAL | Status: DC
  Administered 2016-08-09 – 2016-08-10 (×2): 25 mg via ORAL
  Filled 2016-08-09 (×2): qty 1
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
## 2016-08-09 NOTE — Care Management Note (Signed)
Case Management Note  Patient Details  Name: Jason Choi MRN: [id number] Date of Birth: Jan 28, 1941  Subjective/Objective:       Pt admitted with HF             Action/Plan:  PTA for home independent (wife passed away 3 weeks).  Pt states he remains independent and declined social work consult for emotional - CM provided emotional support.  Pt has children in [place] but not in same city - states he has strong support from friends in the local area.  Pt states he weighs himself daily and attempts to adhere to low salt diet.  PT/OT ordered.  Pt remains on multiple drips at this time.  CM will continue to follow for discharge needs   Expected Discharge Date:  08/01/16               Expected Discharge Plan:     In-House Referral:     Discharge planning Services  CM Consult  Post Acute Care Choice:    Choice offered to:     DME Arranged:    DME Agency:     HH Arranged:    HH Agency:     Status of Service:  In process, will continue to follow  If discussed at Long Length of Stay Meetings, dates discussed:    Additional Comments: 08/09/2016  Discharging SNF requesting refresher training for LVAD - Claiborne Billings with [facility] team will provide training today 08/09/16 at facility and will fit pt this evening for planned discharge tomorrow.  Pt is currently off milrinone  08/08/16 Orders written for Life Vest - form faxed to [employer] by CM [facility] 08/07/16 - awaiting insurance approval.  PT has recommended SNF - CSW consulted and informed that pt will need to discharge to a facility that accepts both Milrinone and Life Vest  08/04/16 CM spoke with attending PA and was informed pt will have cardiac cath Monday and Milrinone/Life Vest will be determined post procedure.  Pt will mobilize this weekend - team is in agreement if pt needs to discharge to SNF.  Per attending note; pt may discharge on Milrinone - [facility] made aware tenative referral.  Note also suggested possibility of life vest - CM will contacted Life  vest rep will be contacted when referral is given by doctor.  Life vest application on shadow chart.  Pt is scheduled for right heart cath Monday 08/07/16.  CM will continue to follow for discharge needs  08/03/16 SNF recommended tentatively dependent upon progression -  CSW consulted [name], RN 08/09/2016, 8:42 AM

## 2016-08-09 NOTE — Progress Notes (Signed)
CSW spoke with Althea Charon and patient. Life vest training will occur today in preparation for dc to SNF Thursday.  Percell Locus Percell Lamboy LCSWA [phone]

## 2016-08-09 NOTE — Progress Notes (Signed)
ANTICOAGULATION CONSULT NOTE - Follow Up Consult  Pharmacy Consult for Heparin and Coumadin Indication: atrial fibrillation  Allergies  Allergen Reactions  . Lidocaine Other (See Comments)    Passed out - at the dentist's office  . Procaine Hcl Other (See Comments)    Passed out - at the dentist's office    Patient Measurements: Height: 6\' 3"  (190.5 cm) Weight: 188 lb 4.8 oz (85.4 kg) IBW/kg (Calculated) : 84.5  Vital Signs: Temp: 98.2 F (36.8 C) (03/14 1224) Temp Source: Oral (03/14 1224) BP: 99/82 (03/14 1224) Pulse Rate: 82 (03/14 1224)  Labs:  Recent Labs  08/07/16 0429 08/07/16 0615 08/08/16 0705 08/08/16 0830 08/09/16 0412  HGB 9.8*  --   --  9.8* 10.4*  HCT 30.2*  --   --  29.5* 31.7*  PLT 274  --   --  330 311  LABPROT 15.9*  --  16.7*  --  16.2*  INR 1.26  --  1.34  --  1.29  HEPARINUNFRC  --  0.46 0.56  --  0.45  CREATININE  --  2.52* 2.21*  --  2.19*    Estimated Creatinine Clearance: 35.9 mL/min (by C-G formula based on SCr of 2.19 mg/dL (H)).  Medications: Heparin @ 1400 units/hr  Assessment:  75yom on heparin for afib. S/p slight nose bleed 3/6 which resolved with removal of nasal cannula. Epistaxis/hemoptysis from 3/8 resolved. S/P RHC yesterday and heparin resumed post-cath along with coumadin, however, coumadin dose never given. INR 1.29 today. Heparin level therapeutic at 0.45. CBC stable.  Goal of Therapy:  INR 2-3 Heparin level 0.3-0.7 units/ml Monitor platelets by anticoagulation protocol: Yes   Plan:  1) Continue heparin at 1400 units/hr 2) Coumadin 7.5mg  x 1 3) Daily heparin level, INR, CBC  Nena Jordan, PharmD, BCPS 08/09/2016 1:55 PM

## 2016-08-09 NOTE — Progress Notes (Signed)
[id number] Reviewed CHF booklet zones. Pt has scales. Discussed weighing daily and when to call MD with symptoms of CHF. Gave low sodium diets and discussed 2000 mg restriction. Put on life vest video for pt to view. PT to see today. Graylon Good RN BSN 08/09/2016 2:56 PM

## 2016-08-10 ENCOUNTER — Other Ambulatory Visit: Payer: Self-pay

## 2016-08-10 LAB — PROTIME-INR
INR: 1.35
PROTHROMBIN TIME: 16.8 s — AB (ref 11.4–15.2)

## 2016-08-10 LAB — CBC
HCT: 31.3 % — ABNORMAL LOW (ref 39.0–52.0)
HEMOGLOBIN: 10 g/dL — AB (ref 13.0–17.0)
MCH: 30.1 pg (ref 26.0–34.0)
MCHC: 31.9 g/dL (ref 30.0–36.0)
MCV: 94.3 fL (ref 78.0–100.0)
PLATELETS: 327 10*3/uL (ref 150–400)
RBC: 3.32 MIL/uL — AB (ref 4.22–5.81)
RDW: 16.7 % — ABNORMAL HIGH (ref 11.5–15.5)
WBC: 10.4 10*3/uL (ref 4.0–10.5)

## 2016-08-10 LAB — BASIC METABOLIC PANEL
Anion gap: 8 (ref 5–15)
BUN: 63 mg/dL — AB (ref 6–20)
CHLORIDE: 100 mmol/L — AB (ref 101–111)
CO2: 28 mmol/L (ref 22–32)
CREATININE: 2.34 mg/dL — AB (ref 0.61–1.24)
Calcium: 9 mg/dL (ref 8.9–10.3)
GFR calc non Af Amer: 26 mL/min — ABNORMAL LOW (ref >60)
GFR, EST AFRICAN AMERICAN: 30 mL/min — AB (ref >60)
Glucose, Bld: 73 mg/dL (ref 65–99)
Potassium: 3.9 mmol/L (ref 3.5–5.1)
Sodium: 136 mmol/L (ref 135–145)

## 2016-08-10 LAB — COOXEMETRY PANEL
CARBOXYHEMOGLOBIN: 2.1 % — AB (ref 0.5–1.5)
METHEMOGLOBIN: 0.8 % (ref 0.0–1.5)
O2 SAT: 57.5 %
Total hemoglobin: 10.4 g/dL — ABNORMAL LOW (ref 12.0–16.0)

## 2016-08-10 LAB — HEPARIN LEVEL (UNFRACTIONATED): Heparin Unfractionated: 0.5 IU/mL (ref 0.30–0.70)

## 2016-08-10 MED ORDER — SPIRONOLACTONE 25 MG PO TABS: 12.5000 mg | ORAL_TABLET | Freq: Every day | ORAL | 6 refills | Status: DC

## 2016-08-10 MED ORDER — TRAMADOL HCL 50 MG PO TABS
50.0000 mg | ORAL_TABLET | Freq: Two times a day (BID) | ORAL | 0 refills | Status: DC | PRN
Start: 2016-08-10 — End: 2016-08-10

## 2016-08-10 MED ORDER — METOPROLOL SUCCINATE ER 25 MG PO TB24: 25.0000 mg | ORAL_TABLET | Freq: Every day | ORAL | 6 refills | Status: DC

## 2016-08-10 MED ORDER — TORSEMIDE 20 MG PO TABS: 60.0000 mg | ORAL_TABLET | Freq: Every day | ORAL | 6 refills | Status: DC

## 2016-08-10 MED ORDER — ASPIRIN 81 MG PO TBEC: 81.0000 mg | DELAYED_RELEASE_TABLET | Freq: Every day | ORAL | 0 refills | Status: DC

## 2016-08-10 MED ORDER — WARFARIN SODIUM 5 MG PO TABS: ORAL_TABLET | ORAL | 1 refills | Status: DC

## 2016-08-10 MED ORDER — DIGOXIN 125 MCG PO TABS: 0.1250 mg | ORAL_TABLET | Freq: Every day | ORAL | 6 refills | Status: DC

## 2016-08-10 MED ORDER — TRAZODONE HCL 50 MG PO TABS: 50.0000 mg | ORAL_TABLET | Freq: Every evening | ORAL | 3 refills | Status: DC | PRN

## 2016-08-10 MED ORDER — ALLOPURINOL 100 MG PO TABS: 100.0000 mg | ORAL_TABLET | Freq: Every day | ORAL | 6 refills | Status: DC

## 2016-08-10 MED ORDER — TRAMADOL HCL 50 MG PO TABS: 50.0000 mg | ORAL_TABLET | Freq: Two times a day (BID) | ORAL | 5 refills | Status: DC | PRN

## 2016-08-10 MED ORDER — COLCHICINE 0.6 MG PO TABS: 0.6000 mg | ORAL_TABLET | Freq: Every day | ORAL | 0 refills | Status: DC

## 2016-08-10 MED ORDER — ACETAMINOPHEN 325 MG PO TABS: 650.0000 mg | ORAL_TABLET | ORAL | Status: AC | PRN

## 2016-08-10 NOTE — Progress Notes (Signed)
Patient will DC to: [facility] Anticipated DC date: 08/10/16 Family notified: N/A Transport by: Corey Harold   Per MD patient ready for DC to [facility]. RN, patient, patient's family, and facility notified of DC. Discharge Summary sent to facility. RN given number for report. DC packet on chart. Ambulance transport requested for patient.   CSW signing off.  Cedric Fishman, [place] Social Worker [phone]

## 2016-08-10 NOTE — Progress Notes (Signed)
Physical Therapy Treatment Patient Details Name: XAIDYN KEPNER MRN: [id number] DOB: 09/12/1940 Today's Date: 08/10/2016    History of Present Illness Pt adm with syncope and acute on chronic heart failure. Pt developed rt foot pain and found to have gout. PMH - chf, polio, cad, ckd, afib    PT Comments    Pt making slow, steady progress.    Follow Up Recommendations  SNF     Equipment Recommendations  None recommended by PT    Recommendations for Other Services       Precautions / Restrictions Precautions Precautions: Fall Restrictions Weight Bearing Restrictions: No RLE Weight Bearing: Weight bearing as tolerated    Mobility  Bed Mobility Overal bed mobility: Needs Assistance Bed Mobility: Sidelying to Sit   Sidelying to sit: Min guard;HOB elevated       General bed mobility comments: Incr time and effort and use of rail  Transfers Overall transfer level: Needs assistance Equipment used: Rolling walker (2 wheeled) Transfers: Sit to/from Stand Sit to Stand: Mod assist         General transfer comment: Assist to bring hips up  Ambulation/Gait Ambulation/Gait assistance: Min assist;+2 safety/equipment (chair follow) Ambulation Distance (Feet): 80 Feet Assistive device: Rolling walker (2 wheeled) Gait Pattern/deviations: Step-to pattern;Decreased stance time - right;Decreased step length - left;Decreased stride length;Decreased weight shift to right;Antalgic Gait velocity: decreased Gait velocity interpretation: Below normal speed for age/gender General Gait Details: Assist for balance and support. Pt tolerating more weight on RLE   Stairs            Wheelchair Mobility    Modified Rankin (Stroke Patients Only)       Balance Overall balance assessment: Needs assistance;History of Falls Sitting-balance support: No upper extremity supported;Feet supported Sitting balance-Leahy Scale: Good     Standing balance support: Bilateral upper  extremity supported;During functional activity Standing balance-Leahy Scale: Poor Standing balance comment: walker and min assist for static standing                    Cognition Arousal/Alertness: Awake/alert Behavior During Therapy: WFL for tasks assessed/performed Overall Cognitive Status: Within Functional Limits for tasks assessed                      Exercises      General Comments        Pertinent Vitals/Pain Pain Assessment: Faces Faces Pain Scale: Hurts even more Pain Location: right foot Pain Descriptors / Indicators: Aching;Grimacing Pain Intervention(s): Limited activity within patient's tolerance    Home Living                      Prior Function            PT Goals (current goals can now be found in the care plan section) Progress towards PT goals: Progressing toward goals    Frequency    Min 3X/week      PT Plan Current plan remains appropriate    Co-evaluation             End of Session Equipment Utilized During Treatment: Gait belt Activity Tolerance: Patient limited by pain Patient left: in chair;with call bell/phone within reach Nurse Communication: Mobility status PT Visit Diagnosis: Other abnormalities of gait and mobility (R26.89);Pain Pain - Right/Left: Right Pain - part of body: Ankle and joints of foot     Time: 7124-5809 PT Time Calculation (min) (ACUTE ONLY): 19 min  Charges:  $Gait Training:  8-22 mins                    G Codes:       Shary Decamp [facility] Aug 28, 2016, 11:32 AM Suanne Marker PT [phone]

## 2016-08-10 NOTE — Progress Notes (Signed)
Patient d/c to [facility] SNF stable. Report given to [place].

## 2016-08-10 NOTE — Care Management Note (Signed)
Case Management Note  Patient Details  Name: Jason Choi MRN: [id number] Date of Birth: 07-13-1940  Subjective/Objective:       Pt admitted with HF             Action/Plan:  PTA for home independent (wife passed away 3 weeks).  Pt states he remains independent and declined social work consult for emotional - CM provided emotional support.  Pt has children in [place] but not in same city - states he has strong support from friends in the local area.  Pt states he weighs himself daily and attempts to adhere to low salt diet.  PT/OT ordered.  Pt remains on multiple drips at this time.  CM will continue to follow for discharge needs   Expected Discharge Date:  08/10/16               Expected Discharge Plan:  [place]  In-House Referral:  Clinical Social Work  Discharge planning Services  CM Consult  Post Acute Care Choice:    Choice offered to:     DME Arranged:    DME Agency:     HH Arranged:    [place] Agency:     Status of Service:  Completed, signed off  If discussed at [employer], dates discussed:    Additional Comments: 08/10/2016  Training was provided to facility, life vest was fitted yesterday evening.  Pt to discharge to facility today   08/09/16 Discharging SNF requesting refresher training for LVAD - Claiborne Billings with [facility] team will provide training today 08/09/16 at facility and will fit pt this evening for planned discharge tomorrow.  Pt is currently off milrinone  08/08/16 Orders written for Life Vest - form faxed to [employer] by CM [facility] 08/07/16 - awaiting insurance approval.  PT has recommended SNF - CSW consulted and informed that pt will need to discharge to a facility that accepts both Milrinone and Life Vest  08/04/16 CM spoke with attending PA and was informed pt will have cardiac cath Monday and Milrinone/Life Vest will be determined post procedure.  Pt will mobilize this weekend - team is in agreement if pt needs to discharge to SNF.  Per  attending note; pt may discharge on Milrinone - [facility] made aware tenative referral.  Note also suggested possibility of life vest - CM will contacted Life vest rep will be contacted when referral is given by doctor.  Life vest application on shadow chart.  Pt is scheduled for right heart cath Monday 08/07/16.  CM will continue to follow for discharge needs  08/03/16 SNF recommended tentatively dependent upon progression -  CSW consulted [name], RN 08/10/2016, 11:32 AM

## 2016-08-10 NOTE — Progress Notes (Signed)
RUA PICC d/c'ed per order.  Site wnl, cleaned with CHG, covered with vaseline guaze and dry 2x2.  Pt verbalizes understanding of site care, signs of infection, to hold pressure in event of bleeding and when to call doctor- via teach back method.

## 2016-08-10 NOTE — Discharge Summary (Signed)
Advanced Heart Failure Discharge Note  Discharge Summary   Patient ID: Jason Choi MRN: [id number], DOB/AGE: December 05, 1940 76 y.o. Admit date: 07/27/2016 D/C date:     08/10/2016   Primary Discharge Diagnoses:   1. Acute on chronic systolic CHF: Ischemic cardiomyopathy.  Echo this admission with EF 15%, diffuse hypokinesis, mildly decreased RV systolic function, and severe MR (probably functional).   2. Syncope: No driving until 08/9673 3. CAD: S/p CABG in 2002.  4. CKD: Stage III -IV.  5. Atrial fibrillation: Permanent, failed amiodarone and DCCV. Currently rate controlled.  6. Right foot ulceration 7. NSVT 8. Gout 9. Hyponatremia  Hospital Course:   Jason Sheley Hopkinsis a 76 y.o.malewith h/o CAD s/p CABG, systolic CHF due to ICM, HTN, and Afib. Admitted from Dr. Doug Sou clinic with volume overload. HF team asked to admit and follow with concerns for low output.   PICC placed with initial mixed venous saturation depressed at 44%.  Started on milrinone with improvement. Required levophed for pressure support.   EF this admission with decrease of EF from 30-35% to 15%.   [place] 08/08/16 with optimized filling pressures as below.   Milrinone stopped and tolerated wean. Pt diuresed on IV lasix and eventually transitioned to torsemide. CVP 5 on day of discharge. Digoxin restarted carefully. Medications adjusted as tolerated. No ACE with AKI.    With syncope prior to admission and low EF, Lifevest ordered and placed. Will need for at least 3-6 months while awaiting improvement of EF vs ICD consideration. No driving by [place] DMV law until 01/2017. Pt considered for ICD but with open wound on foot deferred. Will need wound care at SNF.  Hospital course complicated by R foot ulceration.  Wound care and ID saw in patient. Previous Peripheral arterial dopplers did not suggest significant PAD. Vancomycin and Zosyn added 3/5. 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  gouty changes. ID stopped abx. Felt gout flare. Uric Acid 7.8. ESR 47. 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 He remains off antibiotics. Appreciate ID and wound input.  Hyponatremia also noted this admission but improved with close monitoring and free water restriction.  Pt remains on colchicine for gout. Will need transition to allopurinol once flare resolves.   INR sub-therapeutic on discharge. No lovenox ordered with no hx of CVA.  Close coumadin clinic follow up scheduled as below. Will need to stop ASA once INR therapeutic.  Pt noted to have 02 desaturations overnight into 70-80% range. QHS 02 ordered at 2 L via [place].   Overall patient diuresed 8.6 L and down 30 lbs from discharge.  He will be discharged to SNF in stable condition with close follow up as below.   Discharge Weight Range: 189 lbs Discharge Vitals: Blood pressure 116/78, pulse (!) 56, temperature 97.1 F (36.2 C), temperature source Oral, resp. rate 20, height 6' 3"  (1.905 m), weight 189 lb 3.2 oz (85.8 kg), SpO2 96 %.  Labs: Lab Results  Component Value Date   WBC 10.4 08/10/2016   HGB 10.0 (L) 08/10/2016   HCT 31.3 (L) 08/10/2016   MCV 94.3 08/10/2016   PLT 327 08/10/2016     Recent Labs Lab 08/10/16 0350  NA 136  K 3.9  CL 100*  CO2 28  BUN 63*  CREATININE 2.34*  CALCIUM 9.0  GLUCOSE 73   Lab Results  Component Value Date   CHOL 149 01/22/2015   HDL 46 01/22/2015   LDLCALC 89  01/22/2015   TRIG 70 01/22/2015   BNP (last 3 results)  Recent Labs  07/27/16 1933  BNP 3,377.1*    ProBNP (last 3 results) No results for input(s): PROBNP in the last 8760 hours.   Diagnostic Studies/Procedures   RHC (08/08/16): RHC Procedural Findings: Hemodynamics (mmHg) RA mean 8 RV 53/7 PA 58/22, mean 33 PCWP mean 13 Oxygen saturations: PA 66% AO 94% Cardiac Output (Fick) 7.73  Cardiac Index (Fick) 3.56  PVR 2.6 WU Cardiac Output (Thermo) 5.86 Cardiac Index (Thermo) 2.70 PVR  3.4 WU  Discharge Medications   Allergies as of 08/10/2016      Reactions   Lidocaine Other (See Comments)   Passed out - at the dentist's office   Procaine Hcl Other (See Comments)   Passed out - at the dentist's office      Medication List    STOP taking these medications   aspirin 81 MG tablet Replaced by:  aspirin 81 MG EC tablet   aspirin EC 81 MG tablet   bumetanide 2 MG tablet Commonly known as:  BUMEX   carvedilol 25 MG tablet Commonly known as:  COREG   furosemide 80 MG tablet Commonly known as:  LASIX   predniSONE 20 MG tablet Commonly known as:  DELTASONE   ramipril 5 MG capsule Commonly known as:  ALTACE     TAKE these medications   acetaminophen 325 MG tablet Commonly known as:  TYLENOL Take 2 tablets (650 mg total) by mouth every 4 (four) hours as needed for headache or mild pain.   allopurinol 100 MG tablet Commonly known as:  ZYLOPRIM Take 1 tablet (100 mg total) by mouth daily. Start taking on:  08/16/2016   aspirin 81 MG EC tablet Take 1 tablet (81 mg total) by mouth daily. Start taking on:  08/11/2016 Replaces:  aspirin 81 MG tablet   atorvastatin 40 MG tablet Commonly known as:  LIPITOR TAKE 1 TABLET (40 MG TOTAL) BY MOUTH DAILY AT 6 PM. What changed:  See the new instructions.   colchicine 0.6 MG tablet Take 1 tablet (0.6 mg total) by mouth daily. Start taking on:  08/11/2016   digoxin 0.125 MG tablet Commonly known as:  LANOXIN Take 1 tablet (0.125 mg total) by mouth daily. Start taking on:  08/11/2016   KLOR-CON M20 20 MEQ tablet Generic drug:  potassium chloride SA TAKE 1 TABLET (20 MEQ TOTAL) BY MOUTH DAILY.   metoprolol succinate 25 MG 24 hr tablet Commonly known as:  TOPROL-XL Take 1 tablet (25 mg total) by mouth daily. Start taking on:  08/11/2016   multivitamin with minerals Tabs tablet Take 1 tablet by mouth daily. Centrum Silver   spironolactone 25 MG tablet Commonly known as:  ALDACTONE Take 0.5 tablets (12.5 mg  total) by mouth daily. Start taking on:  08/11/2016   torsemide 20 MG tablet Commonly known as:  DEMADEX Take 3 tablets (60 mg total) by mouth daily. Start taking on:  08/11/2016   traMADol 50 MG tablet Commonly known as:  ULTRAM Take 1 tablet (50 mg total) by mouth every 12 (twelve) hours as needed for severe pain.   traZODone 50 MG tablet Commonly known as:  DESYREL Take 1 tablet (50 mg total) by mouth at bedtime as needed for sleep.   triamcinolone ointment 0.5 % Commonly known as:  KENALOG Apply 1 application topically daily.   warfarin 5 MG tablet Commonly known as:  COUMADIN Take 7.5 mg (1 and 1 half tablet) x 2  days, then resume 5 mg daily 08/12/16.  Further per coumadin clinic What changed:  See the new instructions.            Durable Medical Equipment        Start     Ordered   08/10/16 1011  For home use only DME oxygen  Once    Question Answer Comment  Mode or (Route) Nasal cannula   Liters per Minute 2   Frequency Only at night (stationary unit needed)   Oxygen delivery system Gas      08/10/16 1010   08/04/16 1017  Heart failure home health orders  (Heart failure home health orders / Face to face)  Once    Comments:  Heart Failure Follow-up Care:  Verify follow-up appointments per Patient Discharge Instructions. Confirm transportation arranged. Reconcile home medications with discharge medication list. Remove discontinued medications from use. Assist patient/caregiver to manage medications using pill box. Reinforce low sodium food selection Assessments: Vital signs and oxygen saturation at each visit. Assess home environment for safety concerns, caregiver support and availability of low-sodium foods. Consult Education officer, museum, PT/OT, Dietitian, and CNA based on assessments. Perform comprehensive cardiopulmonary assessment. Notify MD for any change in condition or weight gain of 3 pounds in one day or 5 pounds in one week with symptoms. Daily Weights and  Symptom Monitoring: Ensure patient has access to scales. Teach patient/caregiver to weigh daily before breakfast and after voiding using same scale and record.    Teach patient/caregiver to track weight and symptoms and when to notify Provider. Activity: Develop individualized activity plan with patient/caregiver.  Home milrinone  Question Answer Comment  Heart Failure Follow-up Care Advanced [facility] Failure ([facility]F) [facility] at [place] Visits Set up telemonitoring equipment to monitor daily vital signs, weights and oxygen saturation   Obtain the following labs Basic Metabolic Panel   Lab frequency Weekly   Fax lab results to AHF Clinic at [phone]   Diet Low Sodium Heart Healthy   Fluid restrictions: 2000 mL Fluid      08/04/16 1018   08/04/16 1016  For home use only DME Vest life vest  Once     08/04/16 1015      Disposition   The patient will be discharged in stable condition to SNF as below.  Discharge Instructions    Diet - low sodium heart healthy    Complete by:  As directed    Increase activity slowly    Complete by:  As directed       Contact information for follow-up providers    Loralie Champagne, MD Follow up on 08/15/2016.   Specialty:  Cardiology Why:  at 1030 for post hospital follow up. Please bring all of your medications or a MAR to your visit. The code for parking is 5002. Contact information: [place] [address] [phone]        [facility] Follow up on 08/16/2016.   Specialty:  Cardiology Why:  at 1100 am for coumadin check.  Contact information: [address] [place] [phone]           Contact information for after-discharge care    Destination    [facility] Follow up.   Specialty:  [place] information: [address] [place] [phone]  Duration of Discharge Encounter: Greater than 35 minutes   Signed, Shirley Friar, PA-C 08/10/2016, 10:49 AM

## 2016-08-10 NOTE — Clinical Social Work Placement (Signed)
   CLINICAL SOCIAL WORK PLACEMENT  NOTE  Date:  08/10/2016  Patient Details  Name: Jason Choi MRN: [id number] Date of Birth: [birth year]  Clinical Social Work is seeking post-discharge placement for this patient at the [place] level of care (*CSW will initial, date and re-position this form in  chart as items are completed):  Yes   Patient/family provided with [facility] Work Department's list of facilities offering this level of care within the geographic area requested by the patient (or if unable, by the patient's family).  Yes   Patient/family informed of their freedom to choose among providers that offer the needed level of care, that participate in Medicare, Medicaid or managed care program needed by the patient, have an available bed and are willing to accept the patient.  Yes   Patient/family informed of [facility]'s ownership interest in [facility] and [facility], as well as of the fact that they are under no obligation to receive care at these facilities.  PASRR submitted to EDS on 08/08/16     PASRR number received on 08/08/16     Existing PASRR number confirmed on       FL2 transmitted to all facilities in geographic area requested by pt/family on 08/08/16     FL2 transmitted to all facilities within larger geographic area on       Patient informed that his/her managed care company has contracts with or will negotiate with certain facilities, including the following:        Yes   Patient/family informed of bed offers received.  Patient chooses bed at [facility]     Physician recommends and patient chooses bed at      Patient to be transferred to [facility] on 08/10/16.  Patient to be transferred to facility by [facility]     Patient family notified on 08/10/16 of transfer.  Name of family member notified:  N/A     PHYSICIAN       Additional Comment:     _______________________________________________ Benard Halsted, [place] 08/10/2016, 12:37 PM
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
## 2016-08-10 NOTE — Telephone Encounter (Signed)
RX faxed to [facility] @ [phone], phone number [phone] 

## 2016-08-11 LAB — CULTURE, BLOOD (ROUTINE X 2)
CULTURE: NO GROWTH
Culture: NO GROWTH

## 2016-08-11 LAB — POCT INR: INR: 1.3 — AB (ref 0.9–1.1)

## 2016-08-11 LAB — PROTIME-INR: Protime: 16.1 seconds — AB (ref 10.0–13.8)

## 2016-08-14 ENCOUNTER — Encounter: Payer: Self-pay | Admitting: Adult Health

## 2016-08-14 ENCOUNTER — Non-Acute Institutional Stay (SKILLED_NURSING_FACILITY): Payer: Medicare Other | Admitting: Adult Health

## 2016-08-14 DIAGNOSIS — I5021 Acute systolic (congestive) heart failure: Secondary | ICD-10-CM

## 2016-08-14 DIAGNOSIS — N183 Chronic kidney disease, stage 3 unspecified: Secondary | ICD-10-CM

## 2016-08-14 DIAGNOSIS — E7800 Pure hypercholesterolemia, unspecified: Secondary | ICD-10-CM | POA: Diagnosis not present

## 2016-08-14 DIAGNOSIS — I10 Essential (primary) hypertension: Secondary | ICD-10-CM | POA: Diagnosis not present

## 2016-08-14 DIAGNOSIS — D649 Anemia, unspecified: Secondary | ICD-10-CM

## 2016-08-14 DIAGNOSIS — I482 Chronic atrial fibrillation: Secondary | ICD-10-CM

## 2016-08-14 DIAGNOSIS — R55 Syncope and collapse: Secondary | ICD-10-CM | POA: Diagnosis not present

## 2016-08-14 DIAGNOSIS — I4821 Permanent atrial fibrillation: Secondary | ICD-10-CM

## 2016-08-14 LAB — PROTIME-INR: Protime: 21.9 seconds — AB (ref 10.0–13.8)

## 2016-08-14 LAB — POCT INR: INR: 1.9 — AB (ref <=1.1)

## 2016-08-14 NOTE — Progress Notes (Signed)
Location:   [place] [place] Room Number: 110 A Place of Service:  SNF (31)   CODE STATUS: Full Code  Allergies  Allergen Reactions  . Lidocaine Other (See Comments)    Passed out - at the dentist's office  . Procaine Hcl Other (See Comments)    Passed out - at the dentist's office    Chief Complaint  Patient presents with  . Hospitalization Follow-up    Hospital Follow Up    HPI:  He has been hospitalized for acute on chronic systolic failure has a life vest in place ; syncope with no driving until Sept 0626; afib; acute gout. He does have 2 right outer ulcerations present; no suggestion of PAD per doppler studies; his IV abt was stopped; and felt to be more of a gouty flare. He is here for short term rehab.    Past Medical History:  Diagnosis Date  . Anemia    Referral to GI Feb 2013  . Atrial fibrillation ([place])    ON AMIODARONE: PER VISIT NOTE IN [facility]  . Cataract immature    BILATERAL  . Chronic anticoagulation    on coumadin  . Chronic kidney disease (CKD), stage II (mild)   . High risk medication use    on amiodarone  . HTN (hypertension)   . Hypercholesteremia   . LV dysfunction    EF 35 to 40% per echo May 2012; EF remains 35 to 40% per echo Feb 2013. Referred for ICD  . Myocardial infarction   . Osteoarthritis of right shoulder region    ACUTE PAIN  . 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 Pulmonary hypertension, moderate to severe   . Rotator cuff tear 2012  . S/P CABG (coronary artery bypass graft) 2002   SVG to PDA, Free radial to Intermediate, LIMA to LAD by Dr Prescott Gum    Past Surgical History:  Procedure Laterality Date  . CARDIAC CATHETERIZATION  2009   Grafts patent  . CARDIAC CATHETERIZATION  2012   Grafts patent. EF was 25 to 30% but 35 to 40 by echo  . CARDIAC CATHETERIZATION  2003  . CARDIOVERSION  2008--  FOR PAF   SUCCESSFUL  . CARDIOVERSION N/A 06/13/2013   Procedure: CARDIOVERSION;  Surgeon: Peter M  Martinique, MD;  Location: [facility] ENDOSCOPY;  Service: Cardiovascular;  Laterality: N/A;  . CORONARY ARTERY BYPASS GRAFT  2002   X3 VESSEL  . Left Knee Arthroscopy  2000  . RIGHT HEART CATH N/A 08/08/2016   Procedure: Right Heart Cath;  Surgeon: Larey Dresser, MD;  Location: [facility] CV LAB;  Service: Cardiovascular;  Laterality: N/A;  . Right Leg surgeries     due to polio  . right shoulder rotator cuffe repair  05/16/11    Social History   Social History  . Marital status: Married    Spouse name: N/A  . Number of children: 1  . Years of education: N/A   Occupational History  . 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  [employer]   Social History Main Topics  . Smoking status: Former Smoker    Types: Cigarettes    Quit date: 05/12/1979  . Smokeless tobacco: Never Used     Comment: stopped smoking over 30 years ago.  . Alcohol use 8.4 oz/week    14 Cans of beer per week     Comment: couple beers and a shot  of crown royal most days  . Drug use: No  . Sexual activity: Not Currently   Other Topics Concern  . Not on file   Social History Narrative   Pt lives in [place] with spouse (who is a Systems analyst).  1 grown healthy daughter who is a Insurance claims handler.       Retired 2003 from [employer].  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   Family History  Problem Relation Age of Onset  . Valvular heart disease Father   . Colon cancer Neg Hx   . Colon polyps Neg Hx   . Rectal cancer Neg Hx   . Stomach cancer Neg Hx       VITAL SIGNS BP 110/62   Pulse 61   Temp 98 F (36.7 C)   Resp 18   Ht 6\' 3"  (1.905 m)   Wt 193 lb (87.5 kg)   SpO2 95%   BMI 24.12 kg/m   Patient's Medications  New Prescriptions   No medications on file  Previous Medications   ACETAMINOPHEN (TYLENOL) 325 MG TABLET    Take 2 tablets (650 mg total) by mouth every 4 (four) hours as needed for headache or mild pain.   ALLOPURINOL (ZYLOPRIM) 100  MG TABLET    Take 1 tablet (100 mg total) by mouth daily.   ASPIRIN EC 81 MG EC TABLET    Take 1 tablet (81 mg total) by mouth daily.   ATORVASTATIN (LIPITOR) 40 MG TABLET    Take 40 mg by mouth every evening.   COLCHICINE 0.6 MG TABLET    Take 1 tablet (0.6 mg total) by mouth daily.   DIGOXIN (LANOXIN) 0.125 MG TABLET    Take 1 tablet (0.125 mg total) by mouth daily.   KLOR-CON M20 20 MEQ TABLET    TAKE 1 TABLET (20 MEQ TOTAL) BY MOUTH DAILY.   METOPROLOL SUCCINATE (TOPROL-XL) 25 MG 24 HR TABLET    Take 1 tablet (25 mg total) by mouth daily.   MULTIPLE VITAMIN (MULTIVITAMIN WITH MINERALS) TABS TABLET    Take 1 tablet by mouth daily. Centrum Silver   SKIN PROTECTANTS, MISC. (CALAZIME SKIN PROTECTANT EX)    Apply to sacrum topically daily   SPIRONOLACTONE (ALDACTONE) 25 MG TABLET    Take 0.5 tablets (12.5 mg total) by mouth daily.   TOLNAFTATE (TINACTIN) 1 % CREAM    Apply to groin area topically daily   TORSEMIDE (DEMADEX) 20 MG TABLET    Take 3 tablets (60 mg total) by mouth daily.   TRAMADOL (ULTRAM) 50 MG TABLET    Take 1 tablet (50 mg total) by mouth every 12 (twelve) hours as needed for severe pain.   TRAZODONE (DESYREL) 50 MG TABLET    Take 1 tablet (50 mg total) by mouth at bedtime as needed for sleep.   TRIAMCINOLONE OINTMENT (KENALOG) 0.5 %    Apply 1 application topically daily.   WARFARIN (COUMADIN) 5 MG TABLET    Take 7.5 mg (1 and 1 half tablet) x 2 days, then resume 5 mg daily 08/12/16.  Further per coumadin clinic  Modified Medications   No medications on file  Discontinued Medications   No medications on file     SIGNIFICANT DIAGNOSTIC EXAMS  07-28-16: 2- d echo: - Left ventricle: The cavity size was severely dilated. Wall thickness was normal. Systolic function was severely reduced. The estimated ejection fraction was 15%. Diffuse hypokinesis. There is akinesis of the inferolateral myocardium.  Doppler parameters are consistent with high ventricular filling pressure. - Mitral  valve: Calcified annulus. There was severe regurgitation. - Left atrium: The atrium was severely dilated. - Right ventricle: Systolic function was mildly reduced. - Right atrium: The atrium was moderately dilated. - Pulmonary arteries: Systolic pressure was moderately increased. PA peak pressure: 49 mm Hg (S).  08-01-16: right foot x-ray: 1. Interim finding of diffuse sclerosis and periosteal reaction involving the fourth and fifth metatarsals with diffuse increased density of the second and third metatarsals, the findings would be concerning for osteomyelitis. 2. Erosive change involving the head of the first metatarsal, could be secondary to inflammatory arthropathy or possible erosive changes of osteomyelitis. 3. Marked lobulated soft tissue swelling laterally and over the dorsum of the foot. 4. 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  08-03-16: chest x-ray; Suspected chronic compensated heart failure. Mild patchy bilateral lower lobe opacities, likely atelectasis.  08-06-16: mri of right ankle: 1. 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 This area is amenable to percutaneous aspiration. 2. Multiple erosions in the intermetatarsal spaces, dorsal navicular- medial cuneiform joint and tarsometatarsal joints also concerning for changes secondary to gout. *Extensive full-thickness cartilage loss of the tibiotalar joint. Moderate joint effusion.    LABS REVIEWED:   07-27-16:wbc 6.1; hgb 11.3; hct 36.0; mcv 98.6; plt 187; glucose 150; bun 52; creat 1.97; k+ 4.6; na++ 139; liver normal albumin 3.5; mag 2.3; tsh 2.280; BNP 3377.1 07-29-16; wbc 5.6; hgb 10.0; hct 31.5; mcv 97.2; plt 167; glucose 110; bun 46; creat 1.63; k+ 3.4; na++ 138 08-01-16: wbc 8.7; hgb 9.7; hct 28.9; mcv 94.4 plt 181; glucose 118; bun 45; creat 2.02; k+ 4.6; na++ 130; ca 8.7; dig 0.6 08-03-16: uric  acid: 7.8 08-04-16: wbc 13.7; hgb 9.7; hct 28.5 ;mcv 90.5; plt 263; glucose 206; bun 47; creat 2.33; k+ 3.9; na++ 128;  08-06-16: wbc 9.5; hgb 9.5; hct 28.1; mcv 90.6; plt 299; glucose 121; bun 73'; creat 2.70; k+ 4.9; na++ 130; blood cultures: no growth; sed rate 38 08-10-16: wbc 10.4; hgb 10.0; hct 31.3; mcv 94.3;plt 327; glucose 73; bun 63; creat 2.34; k+ 3.9; na++ 136    Review of Systems  Constitutional: Negative for malaise/fatigue.  Respiratory: Negative for cough and shortness of breath.   Cardiovascular: Negative for chest pain, palpitations and leg swelling.       Has life vest on   Gastrointestinal: Negative for abdominal pain, constipation and heartburn.  Musculoskeletal: Negative for back pain, joint pain and myalgias.  Skin: Negative.        Has 2 ulcerations on right outer ankle   Neurological: Negative for dizziness.  Psychiatric/Behavioral: The patient is not nervous/anxious.     Physical Exam  Constitutional: He is oriented to person, place, and time. No distress.  Eyes: Conjunctivae are normal.  Neck: Neck supple. No JVD present. No thyromegaly present.  Cardiovascular: Normal rate, regular rhythm and intact distal pulses.   Respiratory: Effort normal and breath sounds normal. No respiratory distress. He has no wheezes.  GI: Soft. Bowel sounds are normal. He exhibits no distension. There is no tenderness.  Musculoskeletal: He exhibits no edema.  Able to move all extremities   Lymphadenopathy:    He has no cervical adenopathy.  Neurological: He is alert and oriented to person, place, and time.  Skin: Skin is warm and dry. He is not diaphoretic.  Right outer ankle 2 ulcerations present #1 superficial red wound base #2: scabbed over Has lower extremity discoloration    Psychiatric: He has a normal mood and affect.     ASSESSMENT/ PLAN:  1. Acute on chronic systolic heart failure: EF 15% (07-28-16): will continue demadex 60 mg daily with k+ 20 meq daily; toprol xl  25 mg daily; digoxin 125 mcg daily has lifevest in place has 2000 CC fluid restriction   2. Afib: heart rate is stable; will continue toprol xl 25 mg daily and digoxin 125 mg daily  for rate control is on asa 81 mg daily until inr therapeutic. Is on long term coumadin therapy.   3. Hypertension: will continue toprol xl 25 mg daily   4. Gout: will continue allopurinol 100 mg daily and colchicine 0.6 mg daily has ultram 50 mg twice daily as needed   5. Dyslipidemia: will continue lipitor 40 mg daily   6. CKD stage III: bun 63; creat 2.34  7. Anemia: hgb 10.0  8. Syncope: EF 15%; no driving until Sept 9767; has life vest in place    Time spent with patient   50  minutes >50% time spent counseling; reviewing medical record; tests; labs; and developing future plan of care    MD is aware of resident's narcotic use and is in agreement with current plan of care. We will attempt to wean resident as apropriate   Ok Edwards NP [facility]  Contact [phone] Monday through Friday 8am- 5pm  After hours call [phone]

## 2016-08-14 NOTE — Progress Notes (Signed)
ENTERED IN ERROR

## 2016-08-15 ENCOUNTER — Telehealth (HOSPITAL_COMMUNITY): Payer: Self-pay | Admitting: *Deleted

## 2016-08-15 ENCOUNTER — Inpatient Hospital Stay (HOSPITAL_COMMUNITY): Admit: 2016-08-15 | Payer: Medicare Other

## 2016-08-15 NOTE — Telephone Encounter (Signed)
Patient no showed for his 10:30 appt this AM. Per Dr.McLean patient really needs to be seen.  I contacted [facility] to figure out why patient no showed and to see if we could get him in for an appt this afternoon.  The nurse I spoke with at [facility] did not know why patient missed appt.  She would need to check with patient and transportation to see if patient could come this afternoon at 2 or 220. Waiting for call back from RN to r/s patient.

## 2016-08-16 ENCOUNTER — Encounter: Payer: Self-pay | Admitting: Internal Medicine

## 2016-08-16 ENCOUNTER — Telehealth (HOSPITAL_COMMUNITY): Payer: Self-pay

## 2016-08-16 ENCOUNTER — Non-Acute Institutional Stay (SKILLED_NURSING_FACILITY): Payer: Medicare Other | Admitting: Internal Medicine

## 2016-08-16 ENCOUNTER — Ambulatory Visit (HOSPITAL_COMMUNITY)
Admission: RE | Admit: 2016-08-16 | Discharge: 2016-08-16 | Disposition: A | Discharge status: Home / Self Care | Payer: No Typology Code available for payment source | Source: Ambulatory Visit | Attending: Internal Medicine | Admitting: Internal Medicine

## 2016-08-16 VITALS — BP 92/58 | HR 66 | Wt 182.0 lb

## 2016-08-16 DIAGNOSIS — Z951 Presence of aortocoronary bypass graft: Secondary | ICD-10-CM | POA: Insufficient documentation

## 2016-08-16 DIAGNOSIS — Z79899 Other long term (current) drug therapy: Secondary | ICD-10-CM | POA: Insufficient documentation

## 2016-08-16 DIAGNOSIS — Z8249 Family history of ischemic heart disease and other diseases of the circulatory system: Secondary | ICD-10-CM | POA: Diagnosis not present

## 2016-08-16 DIAGNOSIS — I5022 Chronic systolic (congestive) heart failure: Secondary | ICD-10-CM | POA: Diagnosis present

## 2016-08-16 DIAGNOSIS — I2510 Atherosclerotic heart disease of native coronary artery without angina pectoris: Secondary | ICD-10-CM | POA: Diagnosis not present

## 2016-08-16 DIAGNOSIS — Z87891 Personal history of nicotine dependence: Secondary | ICD-10-CM | POA: Insufficient documentation

## 2016-08-16 DIAGNOSIS — N183 Chronic kidney disease, stage 3 unspecified: Secondary | ICD-10-CM

## 2016-08-16 DIAGNOSIS — I482 Chronic atrial fibrillation: Secondary | ICD-10-CM

## 2016-08-16 DIAGNOSIS — Z888 Allergy status to other drugs, medicaments and biological substances status: Secondary | ICD-10-CM | POA: Diagnosis not present

## 2016-08-16 DIAGNOSIS — I255 Ischemic cardiomyopathy: Secondary | ICD-10-CM | POA: Insufficient documentation

## 2016-08-16 DIAGNOSIS — I130 Hypertensive heart and chronic kidney disease with heart failure and stage 1 through stage 4 chronic kidney disease, or unspecified chronic kidney disease: Secondary | ICD-10-CM | POA: Diagnosis not present

## 2016-08-16 DIAGNOSIS — R55 Syncope and collapse: Secondary | ICD-10-CM

## 2016-08-16 DIAGNOSIS — I4821 Permanent atrial fibrillation: Secondary | ICD-10-CM

## 2016-08-16 DIAGNOSIS — M1A0710 Idiopathic chronic gout, right ankle and foot, without tophus (tophi): Secondary | ICD-10-CM

## 2016-08-16 DIAGNOSIS — L97519 Non-pressure chronic ulcer of other part of right foot with unspecified severity: Secondary | ICD-10-CM | POA: Insufficient documentation

## 2016-08-16 DIAGNOSIS — Z7901 Long term (current) use of anticoagulants: Secondary | ICD-10-CM

## 2016-08-16 DIAGNOSIS — I4891 Unspecified atrial fibrillation: Secondary | ICD-10-CM | POA: Insufficient documentation

## 2016-08-16 DIAGNOSIS — M109 Gout, unspecified: Secondary | ICD-10-CM | POA: Diagnosis not present

## 2016-08-16 LAB — COMPREHENSIVE METABOLIC PANEL
ALT: 44 U/L (ref 17–63)
AST: 35 U/L (ref 15–41)
Albumin: 3 g/dL — ABNORMAL LOW (ref 3.5–5.0)
Alkaline Phosphatase: 106 U/L (ref 38–126)
Anion gap: 12 (ref 5–15)
BILIRUBIN TOTAL: 0.8 mg/dL (ref 0.3–1.2)
BUN: 52 mg/dL — AB (ref 6–20)
CHLORIDE: 97 mmol/L — AB (ref 101–111)
CO2: 30 mmol/L (ref 22–32)
CREATININE: 2.28 mg/dL — AB (ref 0.61–1.24)
Calcium: 9.9 mg/dL (ref 8.9–10.3)
GFR calc Af Amer: 31 mL/min — ABNORMAL LOW (ref >60)
GFR, EST NON AFRICAN AMERICAN: 27 mL/min — AB (ref >60)
Glucose, Bld: 120 mg/dL — ABNORMAL HIGH (ref 65–99)
Potassium: 4.5 mmol/L (ref 3.5–5.1)
Sodium: 139 mmol/L (ref 135–145)
Total Protein: 6.9 g/dL (ref 6.5–8.1)

## 2016-08-16 LAB — DIGOXIN LEVEL: Digoxin Level: 0.5 ng/mL — ABNORMAL LOW (ref 0.8–2.0)

## 2016-08-16 LAB — CBC
HCT: 39.9 % (ref 39.0–52.0)
Hemoglobin: 13 g/dL (ref 13.0–17.0)
MCH: 31.1 pg (ref 26.0–34.0)
MCHC: 32.6 g/dL (ref 30.0–36.0)
MCV: 95.5 fL (ref 78.0–100.0)
PLATELETS: 413 10*3/uL — AB (ref 150–400)
RBC: 4.18 MIL/uL — ABNORMAL LOW (ref 4.22–5.81)
RDW: 16.8 % — AB (ref 11.5–15.5)
WBC: 8.2 10*3/uL (ref 4.0–10.5)

## 2016-08-16 LAB — PROTIME-INR
INR: 2.57
Prothrombin Time: 28.1 seconds — ABNORMAL HIGH (ref 11.4–15.2)

## 2016-08-16 LAB — BRAIN NATRIURETIC PEPTIDE: B Natriuretic Peptide: 1060.1 pg/mL — ABNORMAL HIGH (ref 0.0–100.0)

## 2016-08-16 NOTE — Assessment & Plan Note (Signed)
In context of acute on chronic systolic  heart failure Ejection fraction 15%  LifeVest to be worn June or September based on cardiology evaluation

## 2016-08-16 NOTE — Progress Notes (Signed)
This is a comprehensive admission note to [facility] performed on this date less than 30 days from date of admission. Included are preadmission medical/surgical history;reconciled medication list; family history; social history and comprehensive review of systems.  Corrections and additions to the records were documented . Comprehensive physical exam was also performed. Additionally a clinical summary was entered for each active diagnosis pertinent to this admission in the Problem List to enhance continuity of care.  PCP: Jerilynn Birkenhead, MD, [facility]  HPI: The patient was hospitalized 3/1-3/15/18 with acute on chronic systolic congestive heart failure in the context of ischemic cardiomyopathy Ejection fraction was 15 %;also  echo revealed diffuse hypokinesis with severe mitral regurgitation. The patient was admitted from the heart failure clinic with volume overload.The  patient had experienced syncope prior to admission.  PICC line was placed and he was started on milrinone.Levophed was required for pressure support. LifeVest was placed Milrinone was weaned & discontinued and he was diuresed with IV Lasix. He was transitioned to oral torsemide.Cardiology restarted digoxin. The patient diuresed 8.6 L and lost 30 pounds during the hospitalization. Because of the low EF and syncope patient was to employ the LifeVest for 3-6 months. He was not allowed to drive until @ least 7/25. ICD was considered but not performed because of an open wound on his foot. For the ulceration  he received dual venous antibiotic therapy. Osteomyelitis versus gout changes of the second third metatarsals was considered. ID felt that this was gout rather than infectious. 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 After the gout flare resolved allopurinol was added. Discharge INR was subtherapeutic Lovenox was not ordered because of a history of stroke. He was to be  followed up in clinic clinic but has been admitted to SNF for rehab. O2 sats transiently dropped into the 70s-80s requiring 2 L of oxygen. He was seen at the chronic systolic congestive heart clinic today 3/21. Pressure was 92/58, O 2 sats were 98%. Labs revealed creatinine  2.28, BUN 52, GFR 27. Potassium was 4.5, he is on spironolactone as well as potassium supplement. BNP was 1060. PT/INR was therapeutic at 2.57 Coumadin Clinic is managing his dose. His anemia had resolved. Follow-up is recommended in 2 weeks.  Past medical and surgical history:Include coronary artery disease with MI, pulmonary hypertension, history of polio,HTN, dyslipidemia, A. fib, and chronic kidney disease. He underwent bypass grafting in 2002, 3 vessel. He had cardio version twice.  Social history: Former smoker, quit 1980. 2 cans of beer daily.  Family history: Epic record reviewed  Review of systems: His main symptom at this time is fatigue. He did have localized swelling of the right ankle 3/19 which he relates to the acute gout ;he denies any purulent drainage from the wound or constitutional symptoms.  Constitutional: No fever,significant weight change Eyes: No redness, discharge, pain, vision change ENT/mouth: No nasal congestion,  purulent discharge, earache,change in hearing ,sore throat  Cardiovascular: No chest pain, palpitations,paroxysmal nocturnal dyspnea, claudication  Respiratory: No cough, sputum production,hemoptysis, DOE , significant snoring,apnea  Gastrointestinal: No heartburn,dysphagia,abdominal pain, nausea / vomiting,rectal bleeding, melena,change in bowels Genitourinary: No dysuria,hematuria, pyuria,  incontinence, nocturia Musculoskeletal: No joint stiffness, joint swelling, weakness,pain Dermatologic: No rash, pruritus, change in appearance of skin Neurologic: No dizziness,headache,syncope, seizures, numbness , tingling Psychiatric: No significant anxiety , depression, insomnia,  anorexia Endocrine: No change in hair/skin/ nails, excessive thirst, excessive hunger, excessive urination  Hematologic/lymphatic: No significant bruising,  lymphadenopathy,abnormal bleeding Allergy/immunology: No itchy/ watery eyes, significant sneezing, urticaria, angioedema  Physical exam:  Pertinent or positive findings: Very pertinent was presence of salted cheese/peanut butter crackers at the bedside. Each serving has 380 mg of sodium. Sprite  At bedside has High Fructose Corn Syrup with  13.5 teaspoons of sugar per serving. I explained that HFCS was a #1 cause of hyperuricemia and gout in this country. He has slight ptosis of the right eye. He has an upper dental plate.Heart rate is slow & irregular.  LifeVest is worn.Abdomen protuberant & doughy.He has mixed arthritic change in the hands with tophi. There is atrophy of the right lower extremity especially below the knee. L knee cap deformed with  boss superior laterally. Pedal pulses are decreased. He has 1/2+ pitting edema  General appearance:Adequately nourished; no acute distress , increased work of breathing is present.   Lymphatic: No lymphadenopathy about the head, neck, axilla . Eyes: No conjunctival inflammation or lid edema is present. There is no scleral icterus. Ears:  External ear exam shows no significant lesions or deformities.   Nose:  External nasal examination shows no deformity or inflammation. Nasal mucosa are pink and moist without lesions ,exudates Oral exam: lips and gums are healthy appearing.There is no oropharyngeal erythema or exudate . Neck:  No thyromegaly, masses, tenderness noted.    Heart:  Normal rate and regular rhythm. S1 and S2 normal without gallop, murmur, click, rub .  Lungs:Chest clear to auscultation without wheezes, rhonchi,rales , rubs. Abdomen:Bowel sounds are normal. Abdomen is soft and nontender with no organomegaly, hernias,masses. GU: deferred  Extremities:  No cyanosis, clubbing  Neurologic  exam : Strength equal  in upper & lower extremities Skin: Warm & dry w/o tenting. No significant lesions or rash.  See clinical summary under each active problem in the Problem List with associated updated therapeutic plan

## 2016-08-16 NOTE — Assessment & Plan Note (Addendum)
BMET 2 CHF clinic today 2.28, stable to improved Role of hyperuricemia and renal insufficiency discussed with patient.

## 2016-08-16 NOTE — Assessment & Plan Note (Signed)
See cardiology notes 3/21 Sodium ingestion ind snack foods discussed with him

## 2016-08-16 NOTE — Patient Instructions (Signed)
See assessment and plan under each diagnosis in the problem list and acutely for this visit 
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
## 2016-08-16 NOTE — Assessment & Plan Note (Signed)
To prevent gout the minimal uric acid goal is < 7; preferred is < 6, ideal or protective value is < 5 .  The most common cause of elevated uric acid is the ingestion of sugar from high fructose corn syrup sources in processed foods & drinks. I informed him he should consume less than 40 grams (ideally ZERO) of sugar per day from foods and drinks with high fructose corn syrup as number 1,2, 3, or #4 on the label. Chronic elevation of uric acid is not an isolated arthritic issue;it also increases cardiac & kidney disease risk.

## 2016-08-16 NOTE — Assessment & Plan Note (Signed)
Rate controlled 

## 2016-08-16 NOTE — Patient Instructions (Signed)
Routine lab work today. Will notify you of abnormal results, otherwise no news is good news!  Follow up 2 weeks.  Do the following things EVERYDAY: 1) Weigh yourself in the morning before breakfast. Write it down and keep it in a log. 2) Take your medicines as prescribed 3) Eat low salt foods-Limit salt (sodium) to 2000 mg per day.  4) Stay as active as you can everyday 5) Limit all fluids for the day to less than 2 liters

## 2016-08-16 NOTE — Telephone Encounter (Signed)
Faylene Million Peptide  Order: [id number]  Status:  Final result Visible to patient:  No (Not Released) Dx:  Chronic systolic CHF (congestive hear...  Notes recorded by Effie Berkshire, RN on 08/16/2016 at 4:31 PM EDT Althea Charon staff and patient made aware of DC ASA order. Order faxed to provided fax #. ------  Notes recorded by Kerry Dory, CMA on 08/16/2016 at 2:31 PM EDT As requested results forwarded to [facility] coumadin clinic ------  Notes recorded by Shirley Friar, PA-C on 08/16/2016 at 1:35 PM EDT Should STOP Aspirin with therapeutic INR.    Please forward to Coumadin Clinic ( Did not go due to weather)   Other labs stable.   Legrand Como [address]" [place], PA-C 08/16/2016 1:35 PM

## 2016-08-17 LAB — PROTIME-INR: PROTIME: 33.6 s — AB (ref 10.0–13.8)

## 2016-08-17 LAB — POCT INR: INR: 3.3 — AB (ref 0.9–1.1)

## 2016-08-25 ENCOUNTER — Ambulatory Visit (INDEPENDENT_AMBULATORY_CARE_PROVIDER_SITE_OTHER): Payer: Medicare Other | Admitting: Pharmacist Clinician (PhC)/ Clinical Pharmacy Specialist

## 2016-08-25 DIAGNOSIS — I4821 Permanent atrial fibrillation: Secondary | ICD-10-CM

## 2016-08-25 DIAGNOSIS — I482 Chronic atrial fibrillation: Secondary | ICD-10-CM

## 2016-08-25 DIAGNOSIS — Z5181 Encounter for therapeutic drug level monitoring: Secondary | ICD-10-CM

## 2016-08-25 LAB — PROTIME-INR: INR: 6 — AB (ref <=1.1)

## 2016-08-28 ENCOUNTER — Ambulatory Visit (INDEPENDENT_AMBULATORY_CARE_PROVIDER_SITE_OTHER): Payer: Medicare Other | Admitting: Pharmacist Clinician (PhC)/ Clinical Pharmacy Specialist

## 2016-08-28 ENCOUNTER — Encounter (HOSPITAL_COMMUNITY): Payer: Self-pay | Admitting: Emergency Medicine

## 2016-08-28 ENCOUNTER — Emergency Department (HOSPITAL_COMMUNITY)
Admission: EM | Admit: 2016-08-28 | Discharge: 2016-08-29 | Disposition: A | Discharge status: Home / Self Care | Payer: Medicare Other | Attending: Emergency Medicine | Admitting: Emergency Medicine

## 2016-08-28 DIAGNOSIS — Z87891 Personal history of nicotine dependence: Secondary | ICD-10-CM | POA: Insufficient documentation

## 2016-08-28 DIAGNOSIS — I482 Chronic atrial fibrillation: Secondary | ICD-10-CM

## 2016-08-28 DIAGNOSIS — I130 Hypertensive heart and chronic kidney disease with heart failure and stage 1 through stage 4 chronic kidney disease, or unspecified chronic kidney disease: Secondary | ICD-10-CM | POA: Insufficient documentation

## 2016-08-28 DIAGNOSIS — Z951 Presence of aortocoronary bypass graft: Secondary | ICD-10-CM | POA: Diagnosis not present

## 2016-08-28 DIAGNOSIS — I4821 Permanent atrial fibrillation: Secondary | ICD-10-CM

## 2016-08-28 DIAGNOSIS — Z5181 Encounter for therapeutic drug level monitoring: Secondary | ICD-10-CM

## 2016-08-28 DIAGNOSIS — I252 Old myocardial infarction: Secondary | ICD-10-CM | POA: Diagnosis not present

## 2016-08-28 DIAGNOSIS — Z7901 Long term (current) use of anticoagulants: Secondary | ICD-10-CM | POA: Insufficient documentation

## 2016-08-28 DIAGNOSIS — I5022 Chronic systolic (congestive) heart failure: Secondary | ICD-10-CM | POA: Insufficient documentation

## 2016-08-28 DIAGNOSIS — R040 Epistaxis: Secondary | ICD-10-CM | POA: Diagnosis not present

## 2016-08-28 DIAGNOSIS — N183 Chronic kidney disease, stage 3 (moderate): Secondary | ICD-10-CM | POA: Insufficient documentation

## 2016-08-28 DIAGNOSIS — I2510 Atherosclerotic heart disease of native coronary artery without angina pectoris: Secondary | ICD-10-CM | POA: Insufficient documentation

## 2016-08-28 DIAGNOSIS — R791 Abnormal coagulation profile: Secondary | ICD-10-CM | POA: Diagnosis not present

## 2016-08-28 LAB — CBC
HCT: 32.5 % — ABNORMAL LOW (ref 39.0–52.0)
Hemoglobin: 10.5 g/dL — ABNORMAL LOW (ref 13.0–17.0)
MCH: 30.6 pg (ref 26.0–34.0)
MCHC: 32.3 g/dL (ref 30.0–36.0)
MCV: 94.8 fL (ref 78.0–100.0)
Platelets: 241 10*3/uL (ref 150–400)
RBC: 3.43 MIL/uL — AB (ref 4.22–5.81)
RDW: 16.5 % — AB (ref 11.5–15.5)
WBC: 9.2 10*3/uL (ref 4.0–10.5)

## 2016-08-28 LAB — PROTIME-INR
INR: 3.9 — AB (ref <=1.1)
INR: 3.84
Prothrombin Time: 38.7 seconds — ABNORMAL HIGH (ref 11.4–15.2)

## 2016-08-28 MED ORDER — OXYMETAZOLINE HCL 0.05 % NA SOLN
1.0000 | Freq: Once | NASAL | Status: AC
  Administered 2016-08-28: 1 via NASAL
  Filled 2016-08-28: qty 15

## 2016-08-28 MED ORDER — SODIUM CHLORIDE 0.9 % IV BOLUS (SEPSIS)
500.0000 mL | Freq: Once | INTRAVENOUS | Status: AC
  Administered 2016-08-28: 500 mL via INTRAVENOUS

## 2016-08-28 MED ORDER — SODIUM CHLORIDE 0.9 % IV SOLN: INTRAVENOUS | Status: DC

## 2016-08-28 MED ORDER — OXYMETAZOLINE HCL 0.05 % NA SOLN: 1.0000 | Freq: Once | NASAL | Status: DC

## 2016-08-28 NOTE — ED Notes (Signed)
MD at bedside. 

## 2016-08-28 NOTE — ED Notes (Signed)
Pt instructed to hold constant pressure and to avoid changing his tissues. Pt verbalized understanding.

## 2016-08-28 NOTE — ED Triage Notes (Signed)
Pt reports epistaxis ongoing x2 hours, states no relief despite pressure for 2 hours. Pt reports that he has been holding constant pressure, but is changing hands/tissues every few minutes. Pt usually takes coumadin, but INR was 6 earlier this week so coumadin has been on hold since 3/30. Pt from SNF [facility].

## 2016-08-28 NOTE — ED Provider Notes (Signed)
[facility] DEPT Provider Note   CSN: [id number] Arrival date & time: 08/28/16  2214     History   Chief Complaint Chief Complaint  Patient presents with  . Epistaxis    HPI Jason Choi is a 76 y.o. male.  Pt with a h/o HF w/ severely reduced EF presents from his nursing facility with 2 hour complaint of epistaxis. Reports that it started spontaneously today and has been constant since it began. He reports that there is some drainage down his throat but mostly drains from his right nare. He attempted to hold pressure with mild success. Pt had INR of 6.0 on 08/25/16 when his coumadin was stopped. INR checked at his facility today was 3.9. He denies any CP, SOB, or dizziness. He denies any trauma to the nose/face.      Past Medical History:  Diagnosis Date  . Anemia    Referral to GI Feb 2013  . Atrial fibrillation ([place])    ON AMIODARONE: PER VISIT NOTE IN [facility]  . Cataract immature    BILATERAL  . Chronic anticoagulation    on coumadin  . Chronic kidney disease (CKD), stage II (mild)   . High risk medication use    on amiodarone  . HTN (hypertension)   . Hypercholesteremia   . LV dysfunction    EF 35 to 40% per echo May 2012; EF remains 35 to 40% per echo Feb 2013. Referred for ICD  . Myocardial infarction   . Osteoarthritis of right shoulder region    ACUTE PAIN  . 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 Pulmonary hypertension, moderate to severe   . Rotator cuff tear 2012  . S/P CABG (coronary artery bypass graft) 2002   SVG to PDA, Free radial to Intermediate, LIMA to LAD by Dr Prescott Gum    Patient Active Problem List   Diagnosis Date Noted  . Gout 08/16/2016  . Syncope 07/26/2016  . Encounter for therapeutic drug monitoring 07/08/2013  . Hyponatremia 01/03/2012  . CKD (chronic kidney disease) stage 3, GFR 30-59 ml/min   . Anemia   . CAD (coronary artery disease)   . HTN (hypertension)   . Hypercholesteremia   . Chronic  anticoagulation   . Chronic systolic CHF (congestive heart failure) ([place])   . Permanent atrial fibrillation ([place]) 08/22/2010    Past Surgical History:  Procedure Laterality Date  . CARDIAC CATHETERIZATION  2009   Grafts patent  . CARDIAC CATHETERIZATION  2012   Grafts patent. EF was 25 to 30% but 35 to 40 by echo  . CARDIAC CATHETERIZATION  2003  . CARDIOVERSION  2008--  FOR PAF   SUCCESSFUL  . CARDIOVERSION N/A 06/13/2013   Procedure: CARDIOVERSION;  Surgeon: Peter M Martinique, MD;  Location: [facility] ENDOSCOPY;  Service: Cardiovascular;  Laterality: N/A;  . CORONARY ARTERY BYPASS GRAFT  2002   X3 VESSEL  . Left Knee Arthroscopy  2000  . RIGHT HEART CATH N/A 08/08/2016   Procedure: Right Heart Cath;  Surgeon: Larey Dresser, MD;  Location: [facility];  Service: Cardiovascular;  Laterality: N/A;  . Right Leg surgeries     due to polio  . right shoulder rotator cuffe repair  05/16/11       Home Medications    Prior to Admission medications   Medication Sig Start Date End Date Taking? Authorizing Provider  acetaminophen (TYLENOL) 325 MG tablet Take 2 tablets (650 mg total) by mouth every  4 (four) hours as needed for headache or mild pain. 08/10/16   Shirley Friar, PA-C  allopurinol (ZYLOPRIM) 100 MG tablet Take 1 tablet (100 mg total) by mouth daily. 08/16/16   Shirley Friar, PA-C  atorvastatin (LIPITOR) 40 MG tablet Take 40 mg by mouth every evening.    Historical Provider, MD  colchicine 0.6 MG tablet Take 1 tablet (0.6 mg total) by mouth daily. 08/11/16 08/17/16  Shirley Friar, PA-C  digoxin (LANOXIN) 0.125 MG tablet Take 1 tablet (0.125 mg total) by mouth daily. 08/11/16   Satira Mccallum Tillery, PA-C  KLOR-CON M20 20 MEQ tablet TAKE 1 TABLET (20 MEQ TOTAL) BY MOUTH DAILY. 07/24/16   Peter M Martinique, MD  metoprolol succinate (TOPROL-XL) 25 MG 24 hr tablet Take 1 tablet (25 mg total) by mouth daily. 08/11/16   Shirley Friar, PA-C  Multiple Vitamin  (MULTIVITAMIN WITH MINERALS) TABS tablet Take 1 tablet by mouth daily. Centrum Silver    Historical Provider, MD  Skin Protectants, Misc. (CALAZIME SKIN PROTECTANT EX) Apply to sacrum topically daily    Historical Provider, MD  spironolactone (ALDACTONE) 25 MG tablet Take 0.5 tablets (12.5 mg total) by mouth daily. 08/11/16   Shirley Friar, PA-C  tolnaftate (TINACTIN) 1 % cream Apply to groin area topically daily    Historical Provider, MD  torsemide (DEMADEX) 20 MG tablet Take 3 tablets (60 mg total) by mouth daily. 08/11/16   Shirley Friar, PA-C  traMADol (ULTRAM) 50 MG tablet Take 1 tablet (50 mg total) by mouth every 12 (twelve) hours as needed for severe pain. 08/10/16   Lauree Chandler, NP  traZODone (DESYREL) 50 MG tablet Take 1 tablet (50 mg total) by mouth at bedtime as needed for sleep. 08/10/16   Shirley Friar, PA-C  triamcinolone ointment (KENALOG) 0.5 % Apply 1 application topically daily. 06/30/16   Historical Provider, MD  warfarin (COUMADIN) 2 MG tablet Take 2 mg by mouth every evening. Give with 6 mg to = 8 mg    Historical Provider, MD  warfarin (COUMADIN) 6 MG tablet Take 6 mg by mouth every evening. Give with 2 mg to =8 mg    Historical Provider, MD    Family History Family History  Problem Relation Age of Onset  . Valvular heart disease Father   . Colon cancer Neg Hx   . Colon polyps Neg Hx   . Rectal cancer Neg Hx   . Stomach cancer Neg Hx     Social History Social History  Substance Use Topics  . Smoking status: Former Smoker    Types: Cigarettes    Quit date: 05/12/1979  . Smokeless tobacco: Never Used     Comment: stopped smoking over 30 years ago.  . Alcohol use 8.4 oz/week    14 Cans of beer per week     Comment: couple beers and a shot of crown royal most days     Allergies   Lidocaine and Procaine hcl   Review of Systems Review of Systems  Constitutional: Negative for chills and fever.  HENT: Positive for nosebleeds.  Negative for ear pain and sore throat.   Eyes: Negative for pain and visual disturbance.  Respiratory: Negative for cough and shortness of breath.   Cardiovascular: Negative for chest pain and palpitations.  Gastrointestinal: Negative for abdominal pain and vomiting.  Genitourinary: Negative for dysuria and hematuria.  Musculoskeletal: Negative for arthralgias and back pain.  Skin: Negative for color change and rash.  Neurological: Negative for dizziness, seizures and syncope.  All other systems reviewed and are negative.    Physical Exam Updated Vital Signs BP (!) 96/56 (BP Location: Right Arm)   Pulse 75   Temp 98 F (36.7 C) (Oral)   Resp 18   Ht 6\' 3"  (1.905 m)   Wt 78.9 kg   SpO2 97%   BMI 21.75 kg/m   Physical Exam  Constitutional: He appears well-developed and well-nourished.  HENT:  Head: Normocephalic and atraumatic.  Nose: Epistaxis is observed.  Eyes: Conjunctivae are normal.  Neck: Neck supple.  Cardiovascular: Normal rate and regular rhythm.   Pulmonary/Chest: Effort normal. No respiratory distress.  Abdominal: Soft. There is no tenderness.  Musculoskeletal: He exhibits no edema.  Neurological: He is alert.  Skin: Skin is warm and dry.  Psychiatric: He has a normal mood and affect.  Nursing note and vitals reviewed.    ED Treatments / Results  Labs (all labs ordered are listed, but only abnormal results are displayed) Labs Reviewed  PROTIME-INR - Abnormal; Notable for the following:       Result Value   Prothrombin Time 38.7 (*)    All other components within normal limits  CBC - Abnormal; Notable for the following:    RBC 3.43 (*)    Hemoglobin 10.5 (*)    HCT 32.5 (*)    RDW 16.5 (*)    All other components within normal limits    EKG  EKG Interpretation None       Radiology No results found.  Procedures Procedures (including critical care time)  Medications Ordered in ED Medications  oxymetazoline (AFRIN) 0.05 % nasal spray 1  spray (0 sprays Each Nare Hold 08/28/16 2304)  oxymetazoline (AFRIN) 0.05 % nasal spray 1 spray (1 spray Each Nare Given 08/28/16 2236)  sodium chloride 0.9 % bolus 500 mL (500 mLs Intravenous New Bag/Given 08/28/16 2259)    Initial Impression / Assessment and Plan / ED Course  I have reviewed the triage vital signs and the nursing notes.  Pertinent labs & imaging results that were available during my care of the patient were reviewed by me and considered in my medical decision making (see chart for details).  Clinical Course as of Aug 28 2348  Mon Aug 28, 2016  2241 Pt seen and evaluated. BRB from left nare.  [BS]  2246 Afrin administered and nose clamped.  [BS]  2247 BP soft w/ known HF (EF 15% 07/27/16). Will give gentle fluids. BP: (!) 96/56 [BS]  2308 Clamp removed. Epixstaxis resolved initially. Will continue to observe.  [BS]  2328 No further bleeding.  [BS]  2330 Mildly low but is consistent with recent baselines 2 wks prior. Hemoglobin: (!) 10.5 [BS]  2348 Bleeding stopped, so will not treat supratherapeutic INR. INR: 3.84 [BS]    Clinical Course User Index [BS] Holley Raring, MD   Pt's bleeding has resolved. Small clot observed in ant left nare, will not disturb. Pt agreeable to DC back to SNF.  Final Clinical Impressions(s) / ED Diagnoses   Final diagnoses:  Epistaxis  Supratherapeutic INR    New Prescriptions New Prescriptions   No medications on file     Holley Raring, MD 08/28/16 [place], MD 08/28/16 2359    Merrily Pew, MD 08/30/16 5038

## 2016-08-28 NOTE — ED Notes (Signed)
PTAR called by Dwan Hemmelgarn

## 2016-08-29 NOTE — ED Notes (Signed)
Report given to [facility], pt en route to SNF now.

## 2016-08-29 NOTE — ED Notes (Signed)
Reviewed paperwork with patient, gave pt AFRIN bottle and instructed on usage at home. Denies questions regarding discharge. Awaiting PTAR.

## 2016-08-29 NOTE — ED Notes (Signed)
Attempted report to SNF [facility], no answer at this time.

## 2016-08-30 ENCOUNTER — Non-Acute Institutional Stay (SKILLED_NURSING_FACILITY): Payer: Medicare Other | Admitting: Internal Medicine

## 2016-08-30 ENCOUNTER — Ambulatory Visit (HOSPITAL_COMMUNITY)
Admission: RE | Admit: 2016-08-30 | Discharge: 2016-08-30 | Disposition: A | Discharge status: Home / Self Care | Payer: No Typology Code available for payment source | Source: Ambulatory Visit | Attending: Internal Medicine | Admitting: Internal Medicine

## 2016-08-30 ENCOUNTER — Telehealth (HOSPITAL_COMMUNITY): Payer: Self-pay | Admitting: Cardiology

## 2016-08-30 ENCOUNTER — Encounter: Payer: Self-pay | Admitting: Internal Medicine

## 2016-08-30 VITALS — BP 120/50 | HR 70 | Wt 179.6 lb

## 2016-08-30 DIAGNOSIS — M109 Gout, unspecified: Secondary | ICD-10-CM | POA: Diagnosis not present

## 2016-08-30 DIAGNOSIS — Z888 Allergy status to other drugs, medicaments and biological substances status: Secondary | ICD-10-CM | POA: Diagnosis not present

## 2016-08-30 DIAGNOSIS — Z8249 Family history of ischemic heart disease and other diseases of the circulatory system: Secondary | ICD-10-CM | POA: Diagnosis not present

## 2016-08-30 DIAGNOSIS — R0602 Shortness of breath: Secondary | ICD-10-CM | POA: Insufficient documentation

## 2016-08-30 DIAGNOSIS — I130 Hypertensive heart and chronic kidney disease with heart failure and stage 1 through stage 4 chronic kidney disease, or unspecified chronic kidney disease: Secondary | ICD-10-CM | POA: Insufficient documentation

## 2016-08-30 DIAGNOSIS — I255 Ischemic cardiomyopathy: Secondary | ICD-10-CM | POA: Diagnosis present

## 2016-08-30 DIAGNOSIS — Z79899 Other long term (current) drug therapy: Secondary | ICD-10-CM | POA: Insufficient documentation

## 2016-08-30 DIAGNOSIS — N183 Chronic kidney disease, stage 3 unspecified: Secondary | ICD-10-CM

## 2016-08-30 DIAGNOSIS — Z87891 Personal history of nicotine dependence: Secondary | ICD-10-CM | POA: Diagnosis not present

## 2016-08-30 DIAGNOSIS — E785 Hyperlipidemia, unspecified: Secondary | ICD-10-CM | POA: Insufficient documentation

## 2016-08-30 DIAGNOSIS — Z951 Presence of aortocoronary bypass graft: Secondary | ICD-10-CM | POA: Diagnosis not present

## 2016-08-30 DIAGNOSIS — I447 Left bundle-branch block, unspecified: Secondary | ICD-10-CM | POA: Diagnosis not present

## 2016-08-30 DIAGNOSIS — S91309A Unspecified open wound, unspecified foot, initial encounter: Secondary | ICD-10-CM | POA: Insufficient documentation

## 2016-08-30 DIAGNOSIS — I4821 Permanent atrial fibrillation: Secondary | ICD-10-CM

## 2016-08-30 DIAGNOSIS — Z7901 Long term (current) use of anticoagulants: Secondary | ICD-10-CM | POA: Diagnosis not present

## 2016-08-30 DIAGNOSIS — R040 Epistaxis: Secondary | ICD-10-CM

## 2016-08-30 DIAGNOSIS — M1A0710 Idiopathic chronic gout, right ankle and foot, without tophus (tophi): Secondary | ICD-10-CM

## 2016-08-30 DIAGNOSIS — I5022 Chronic systolic (congestive) heart failure: Secondary | ICD-10-CM | POA: Diagnosis present

## 2016-08-30 DIAGNOSIS — I10 Essential (primary) hypertension: Secondary | ICD-10-CM

## 2016-08-30 DIAGNOSIS — R55 Syncope and collapse: Secondary | ICD-10-CM | POA: Insufficient documentation

## 2016-08-30 DIAGNOSIS — I482 Chronic atrial fibrillation: Secondary | ICD-10-CM | POA: Diagnosis not present

## 2016-08-30 DIAGNOSIS — N184 Chronic kidney disease, stage 4 (severe): Secondary | ICD-10-CM | POA: Insufficient documentation

## 2016-08-30 DIAGNOSIS — N179 Acute kidney failure, unspecified: Secondary | ICD-10-CM | POA: Insufficient documentation

## 2016-08-30 DIAGNOSIS — X58XXXA Exposure to other specified factors, initial encounter: Secondary | ICD-10-CM | POA: Insufficient documentation

## 2016-08-30 DIAGNOSIS — I2510 Atherosclerotic heart disease of native coronary artery without angina pectoris: Secondary | ICD-10-CM

## 2016-08-30 LAB — BASIC METABOLIC PANEL
ANION GAP: 10 (ref 5–15)
BUN: 89 mg/dL — ABNORMAL HIGH (ref 6–20)
CALCIUM: 9.1 mg/dL (ref 8.9–10.3)
CO2: 24 mmol/L (ref 22–32)
CREATININE: 2.23 mg/dL — AB (ref 0.61–1.24)
Chloride: 100 mmol/L — ABNORMAL LOW (ref 101–111)
GFR calc non Af Amer: 28 mL/min — ABNORMAL LOW (ref >60)
GFR, EST AFRICAN AMERICAN: 32 mL/min — AB (ref >60)
Glucose, Bld: 125 mg/dL — ABNORMAL HIGH (ref 65–99)
Potassium: 3.7 mmol/L (ref 3.5–5.1)
Sodium: 134 mmol/L — ABNORMAL LOW (ref 135–145)

## 2016-08-30 LAB — BRAIN NATRIURETIC PEPTIDE: B NATRIURETIC PEPTIDE 5: 473.6 pg/mL — AB (ref 0.0–100.0)

## 2016-08-30 MED ORDER — LOSARTAN POTASSIUM 25 MG PO TABS: 12.5000 mg | ORAL_TABLET | Freq: Every day | ORAL | 3 refills | Status: DC

## 2016-08-30 NOTE — Telephone Encounter (Signed)
-----   Message from Shirley Friar, PA-C sent at 08/30/2016  1:19 PM EDT ----- Scratch that.  CANCEL order for losartan with elevated BUN and persistently elevated creatinine.  Please alert [facility].    Legrand Como [address] [place], PA-C 08/30/2016 1:19 PM

## 2016-08-30 NOTE — Progress Notes (Signed)
Advanced Heart Failure Medication Review by a Pharmacist  Does the patient  feel that his/her medications are working for him/her?  yes  Has the patient been experiencing any side effects to the medications prescribed?  no  Does the patient measure his/her own blood pressure or blood glucose at home?  yes   Does the patient have any problems obtaining medications due to transportation or finances?   no  Understanding of regimen: good Understanding of indications: good Potential of compliance: good Patient understands to avoid NSAIDs. Patient understands to avoid decongestants.  Issues to address at subsequent visits: None   Pharmacist comments: Jason Choi is a pleasant 76 yo M presenting from [facility] SN[facility] with an updated MAR. No significant discrepancies noted.   [name], PharmD, BCPS, CPP Clinical Pharmacist Pager: [phone] Phone: [phone] 08/30/2016 11:27 AM      Time with patient: 10 minutes Preparation and documentation time: 2 minutes Total time: 12 minutes

## 2016-08-30 NOTE — Assessment & Plan Note (Signed)
Stable despite low dose spironolactone
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
## 2016-08-30 NOTE — Telephone Encounter (Signed)
Patient aware via facility, facility aware verbally spoke to [facility] @ [phone]. Order faxed to [phone]

## 2016-08-30 NOTE — Assessment & Plan Note (Signed)
To prevent gout the minimal uric acid goal is < 7; preferred is < 6, ideal or protective value is < 5 .  The most common cause of elevated uric acid is the ingestion of sugar from high fructose corn syrup sources in processed foods & drinks. He should consume less than 40 grams (ideally ZERO) of sugar per day from foods and drinks with high fructose corn syrup as number 1,2, 3, or #4 on the label. Chronic elevation of uric acid is not an isolated arthritic issue;it also increases cardiac & kidney disease risk.

## 2016-08-30 NOTE — Patient Instructions (Signed)
See assessment and plan under each diagnosis in the problem list and acutely for this visit 

## 2016-08-30 NOTE — Progress Notes (Signed)
Patient ID: Jason Choi, male   DOB: 1941-04-03, 76 y.o.   MRN: [id number]    This is a nursing facility follow up for specific acute issue of ER visit  follow up  Interim medical record and care since last [place] visit was updated with review of diagnostic studies and change in clinical status since last visit were documented.  HPI: The patient was seen in emergency room 08/28/16 for epistaxis which persisted over 2 hours. It began spontaneously without any injury or predisposition. Although there was some pharyngeal drainage ;it mainly emanated from the right nare. Direct pressure was only minimally beneficial. He had been on warfarin until 08/25/16 when it was discontinued due to an INR of 6.0. INR at the facility 4/2 was 3.9. He denies any other bleeding dyscrasias. Nasal Afrin was administered and the nose clamped. The clamp was removed at 2308 with no further bleeding. Hemoglobin was 10.5. Repeat INR was 3.84. Blood pressure was noted be 96/56. Since he was last seen he has made an effort to avoid sugar in the form of high fructose corn syrup.I had explained the pathophysiology of hyperuricemia from ingestion of this form of sugar. Because of history of gout he is not on HCTZ. He does have renal insufficiency with a creatinine of 2.28 and is on spironolactone at the low dose of 12.5. The creatinine has been stable. Potassium is 4.5. His uric acid was 7.8 on 08/02/16. I explained the goal would be less than 5.   Review of systems: Epistaxis has not recurred. No hemoptysis, hematuria, melena, or rectal bleeding denied. He did note that his stool this morning was darker. No unexplained weight loss, significant dyspepsia,dysphagia, or abdominal pain.  There is no abnormal bruising , bleeding, or difficulty stopping bleeding with injury beyond that he has experienced with Coumadin  Physical exam:  Pertinent or positive findings: he has an upper plate. There is some dried blood  over the right nasal septum.Nasal tissues appear dry. Heart rhythm is irregular. Breath sounds are decreased. It is difficult to auscultate the chest because he is wearing the cardiac vest.  He has mixed arthritic changes in his hands. Atrophy of the right lower extremity greater than the left. Pedal pulses are decreased. He has scattered bruising, particularly over the right upper extremity. He has hyperpigmentation changes of the shins. Pedal pulses are decreased.   General appearance:Adequately nourished; no acute distress , increased work of breathing is present.   Lymphatic: No lymphadenopathy about the head, neck, axilla . Eyes: No conjunctival inflammation or lid edema is present. There is no scleral icterus. Ears:  External ear exam shows no significant lesions or deformities.   Nose:  External nasal examination shows no deformity or inflammation. Nasal mucosa are pink and moist without lesions ,exudates Oral exam: lips and gums are healthy appearing.There is no oropharyngeal erythema or exudate . Neck:  No thyromegaly, masses, tenderness noted.    Heart:  No gallop, murmur, click, rub .  Abdomen:Bowel sounds are normal. Abdomen is soft and nontender with no organomegaly, hernias,masses. GU: deferred  Extremities:  No cyanosis, clubbing,edema  Skin: Warm & dry w/o tenting. No significant lesions or rash.  #1 epistaxis, resolved. This was in the context of PT/INR of 6.0 and drying of the nasal tissue. #2  chronically elevated uric acid. #3 chronic renal insufficiency with stable creatinine and potassium. No negative impact of the low-dose spironolactone Plan: #1 PT/INR as per the Coumadin clinic Eucerin applied topically twice  a day to the nasal tissue  #2  The most common cause of elevated uric acid is the ingestion of sugar from high fructose corn syrup sources. He should consume less than 40 grams  of sugar per day from foods and drinks with high fructose corn syrup as number 1,2, 3,  or #4 on the label.  #3 renal insufficiency, stable. Not a candidate for HCTZ due to history of gout. Monitor creatinine on the low-dose spironolactone.

## 2016-08-30 NOTE — Patient Instructions (Addendum)
Labs today (will call for abnormal results, otherwise no news is good news)  START taking Losartan 12.5 mg (0.5 Tablet) Daily at bedtime.  You have been referred to Cardiology (EP) for ICD consideration.  Please Follow up with Coumadin Clinic ASAP.  Follow up with Doroteo Bradford, PharmD in 3 weeks  Follow up with Dr. [name] in 6 weeks

## 2016-08-31 ENCOUNTER — Non-Acute Institutional Stay (SKILLED_NURSING_FACILITY): Payer: Medicare Other | Admitting: Adult Health

## 2016-08-31 ENCOUNTER — Ambulatory Visit (INDEPENDENT_AMBULATORY_CARE_PROVIDER_SITE_OTHER): Payer: Medicare Other | Admitting: Pharmacist Clinician (PhC)/ Clinical Pharmacy Specialist

## 2016-08-31 ENCOUNTER — Encounter: Payer: Self-pay | Admitting: Adult Health

## 2016-08-31 DIAGNOSIS — M1A0710 Idiopathic chronic gout, right ankle and foot, without tophus (tophi): Secondary | ICD-10-CM

## 2016-08-31 DIAGNOSIS — I4891 Unspecified atrial fibrillation: Secondary | ICD-10-CM | POA: Diagnosis not present

## 2016-08-31 DIAGNOSIS — R55 Syncope and collapse: Secondary | ICD-10-CM | POA: Diagnosis not present

## 2016-08-31 DIAGNOSIS — Z7901 Long term (current) use of anticoagulants: Secondary | ICD-10-CM

## 2016-08-31 DIAGNOSIS — Z5181 Encounter for therapeutic drug level monitoring: Secondary | ICD-10-CM

## 2016-08-31 DIAGNOSIS — I482 Chronic atrial fibrillation: Secondary | ICD-10-CM

## 2016-08-31 DIAGNOSIS — I4821 Permanent atrial fibrillation: Secondary | ICD-10-CM

## 2016-08-31 DIAGNOSIS — I2510 Atherosclerotic heart disease of native coronary artery without angina pectoris: Secondary | ICD-10-CM

## 2016-08-31 DIAGNOSIS — I5022 Chronic systolic (congestive) heart failure: Secondary | ICD-10-CM

## 2016-08-31 LAB — POCT INR: INR: 3.7

## 2016-08-31 NOTE — Progress Notes (Signed)
Location:   [facility] Room Number: 110 A Place of Service:  SNF (31)    CODE STATUS: Full Code  Allergies  Allergen Reactions  . Lidocaine Other (See Comments)    Passed out - at the dentist's office  . Procaine Hcl Other (See Comments)    Passed out - at the dentist's office    Chief Complaint  Patient presents with  . Discharge Note    Discharge    HPI:  He is being discharged to home with home health for pt/ot/rn. He will need a front wheel walker; 3:1 commode and front tub bench . He will need his prescriptions written and will need to follow up with his medical provider.  He had been hospitalized for acute on chronic heart failure. He has been admitted to this facility for short term rehab. He is followed by the coumadin clinic for the management of his coumadin therapy.   Past Medical History:  Diagnosis Date  . Anemia    Referral to GI Feb 2013  . Atrial fibrillation ([place])    ON AMIODARONE: PER VISIT NOTE IN [facility]  . Cataract immature    BILATERAL  . Chronic anticoagulation    on coumadin  . Chronic kidney disease (CKD), stage II (mild)   . High risk medication use    on amiodarone  . HTN (hypertension)   . Hypercholesteremia   . LV dysfunction    EF 35 to 40% per echo May 2012; EF remains 35 to 40% per echo Feb 2013. Referred for ICD  . Myocardial infarction   . Osteoarthritis of right shoulder region    ACUTE PAIN  . 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 Pulmonary hypertension, moderate to severe   . Rotator cuff tear 2012  . S/P CABG (coronary artery bypass graft) 2002   SVG to PDA, Free radial to Intermediate, LIMA to LAD by Dr Prescott Gum    Past Surgical History:  Procedure Laterality Date  . CARDIAC CATHETERIZATION  2009   Grafts patent  . CARDIAC CATHETERIZATION  2012   Grafts patent. EF was 25 to 30% but 35 to 40 by echo  . CARDIAC CATHETERIZATION  2003  . CARDIOVERSION  2008--  FOR PAF   SUCCESSFUL  .  CARDIOVERSION N/A 06/13/2013   Procedure: CARDIOVERSION;  Surgeon: Peter M Martinique, MD;  Location: [facility] ENDOSCOPY;  Service: Cardiovascular;  Laterality: N/A;  . CORONARY ARTERY BYPASS GRAFT  2002   X3 VESSEL  . Left Knee Arthroscopy  2000  . RIGHT HEART CATH N/A 08/08/2016   Procedure: Right Heart Cath;  Surgeon: Larey Dresser, MD;  Location: [facility] CV LAB;  Service: Cardiovascular;  Laterality: N/A;  . Right Leg surgeries     due to polio  . right shoulder rotator cuffe repair  05/16/11    Social History   Social History  . Marital status: Married    Spouse name: N/A  . Number of children: 1  . Years of education: N/A   Occupational History  . 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  [employer]   Social History Main Topics  . Smoking status: Former Smoker    Types: Cigarettes    Quit date: 05/12/1979  . Smokeless tobacco: Never Used     Comment: stopped smoking over 30 years ago.  . Alcohol use 8.4 oz/week    14 Cans of beer  per week     Comment: couple beers and a shot of crown royal most days  . Drug use: No  . Sexual activity: Not Currently   Other Topics Concern  . Not on file   Social History Narrative   Pt lives in [place] with spouse (who is a Systems analyst).  1 grown healthy daughter who is a Insurance claims handler.       Retired 2003 from [employer].  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   Family History  Problem Relation Age of Onset  . Valvular heart disease Father   . Colon cancer Neg Hx   . Colon polyps Neg Hx   . Rectal cancer Neg Hx   . Stomach cancer Neg Hx     VITAL SIGNS BP (!) 105/51   Pulse (!) 51   Temp 97.8 F (36.6 C)   Resp 18   Ht 6\' 3"  (1.905 m)   Wt 179 lb 10 oz (81.5 kg)   SpO2 95%   BMI 22.45 kg/m   Patient's Medications  New Prescriptions   No medications on file  Previous Medications   ACETAMINOPHEN (TYLENOL) 325 MG TABLET    Take 2 tablets (650 mg total) by mouth  every 4 (four) hours as needed for headache or mild pain.   ALLOPURINOL (ZYLOPRIM) 100 MG TABLET    Take 1 tablet (100 mg total) by mouth daily.   AMINO ACIDS-PROTEIN HYDROLYS (FEEDING SUPPLEMENT, PRO-STAT SUGAR FREE 64,) LIQD    Take 30 mLs by mouth 2 (two) times daily.   ATORVASTATIN (LIPITOR) 40 MG TABLET    Take 40 mg by mouth every evening.   CALCIUM ALGINATE EX    Apply topically. Apply to right outer ankle topically every other day   COLCHICINE 0.6 MG TABLET    Take 0.6 mg by mouth daily.   DIGOXIN (LANOXIN) 0.125 MG TABLET    Take 1 tablet (0.125 mg total) by mouth daily.   KLOR-CON M20 20 MEQ TABLET    TAKE 1 TABLET (20 MEQ TOTAL) BY MOUTH DAILY.   METOPROLOL SUCCINATE (TOPROL-XL) 25 MG 24 HR TABLET    Take 1 tablet (25 mg total) by mouth daily.   MULTIPLE VITAMIN (MULTI-VITAMIN DAILY) TABS    Take 1 tablet by mouth daily.   SKIN PROTECTANTS, MISC. (CALAZIME SKIN PROTECTANT EX)    Apply to sacrum topically daily   SPIRONOLACTONE (ALDACTONE) 25 MG TABLET    Take 0.5 tablets (12.5 mg total) by mouth daily.   TOLNAFTATE (TINACTIN) 1 % CREAM    Apply to groin area topically daily   TORSEMIDE (DEMADEX) 20 MG TABLET    Take 3 tablets (60 mg total) by mouth daily.   TRAMADOL (ULTRAM) 50 MG TABLET    Take 1 tablet (50 mg total) by mouth every 12 (twelve) hours as needed for severe pain.   TRAZODONE (DESYREL) 50 MG TABLET    Take 1 tablet (50 mg total) by mouth at bedtime as needed for sleep.   VITAMIN C (ASCORBIC ACID) 500 MG TABLET    Take 500 mg by mouth 2 (two) times daily.   WARFARIN (COUMADIN) 6 MG TABLET    Take 6 mg by mouth every evening.    ZINC SULFATE 220 (50 ZN) MG CAPSULE    Take 220 mg by mouth daily.  Modified Medications   No medications on file  Discontinued Medications   TRIAMCINOLONE OINTMENT (KENALOG) 0.5 %    Apply  1 application topically daily.     SIGNIFICANT DIAGNOSTIC EXAMS  07-28-16: 2- d echo: - Left ventricle: The cavity size was severely dilated. Wall thickness  was normal. Systolic function was severely reduced. The estimated ejection fraction was 15%. Diffuse hypokinesis. There is akinesis of the inferolateral myocardium. Doppler parameters are consistent with high ventricular filling pressure. - Mitral valve: Calcified annulus. There was severe regurgitation. - Left atrium: The atrium was severely dilated. - Right ventricle: Systolic function was mildly reduced. - Right atrium: The atrium was moderately dilated. - Pulmonary arteries: Systolic pressure was moderately increased. PA peak pressure: 49 mm Hg (S).  08-01-16: right foot x-ray: 1. Interim finding of diffuse sclerosis and periosteal reaction involving the fourth and fifth metatarsals with diffuse increased density of the second and third metatarsals, the findings would be concerning for osteomyelitis. 2. Erosive change involving the head of the first metatarsal, could be secondary to inflammatory arthropathy or possible erosive changes of osteomyelitis. 3. Marked lobulated soft tissue swelling laterally and over the dorsum of the foot. 4. 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  08-03-16: chest x-ray; Suspected chronic compensated heart failure. Mild patchy bilateral lower lobe opacities, likely atelectasis.  08-06-16: mri of right ankle: 1. 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 This area is amenable to percutaneous aspiration. 2. Multiple erosions in the intermetatarsal spaces, dorsal navicular- medial cuneiform joint and tarsometatarsal joints also concerning for changes secondary to gout. *Extensive full-thickness cartilage loss of the tibiotalar joint. Moderate joint effusion.    LABS REVIEWED:   07-27-16:wbc 6.1; hgb 11.3; hct 36.0; mcv 98.6; plt 187; glucose 150; bun 52; creat 1.97; k+ 4.6; na++ 139; liver normal albumin 3.5; mag 2.3; tsh 2.280; BNP  3377.1 07-29-16; wbc 5.6; hgb 10.0; hct 31.5; mcv 97.2; plt 167; glucose 110; bun 46; creat 1.63; k+ 3.4; na++ 138 08-01-16: wbc 8.7; hgb 9.7; hct 28.9; mcv 94.4 plt 181; glucose 118; bun 45; creat 2.02; k+ 4.6; na++ 130; ca 8.7; dig 0.6 08-03-16: uric acid: 7.8 08-04-16: wbc 13.7; hgb 9.7; hct 28.5 ;mcv 90.5; plt 263; glucose 206; bun 47; creat 2.33; k+ 3.9; na++ 128;  08-06-16: wbc 9.5; hgb 9.5; hct 28.1; mcv 90.6; plt 299; glucose 121; bun 73'; creat 2.70; k+ 4.9; na++ 130; blood cultures: no growth; sed rate 38 08-10-16: wbc 10.4; hgb 10.0; hct 31.3; mcv 94.3;plt 327; glucose 73; bun 63; creat 2.34; k+ 3.9; na++ 136    Review of Systems  Constitutional: Negative for malaise/fatigue.  Respiratory: Negative for cough and shortness of breath.   Cardiovascular: Negative for chest pain, palpitations and leg swelling.       Has life vest on   Gastrointestinal: Negative for abdominal pain, constipation and heartburn.  Musculoskeletal: Negative for back pain, joint pain and myalgias.  Skin: Negative.        Has 2 ulcerations on right outer ankle   Neurological: Negative for dizziness.  Psychiatric/Behavioral: The patient is not nervous/anxious.     Physical Exam  Constitutional: He is oriented to person, place, and time. No distress.  Eyes: Conjunctivae are normal.  Neck: Neck supple. No JVD present. No thyromegaly present.  Cardiovascular: Normal rate, regular rhythm and intact distal pulses.   Respiratory: Effort normal and breath sounds normal. No respiratory distress. He has no wheezes.  GI: Soft. Bowel sounds are normal. He exhibits no distension. There  is no tenderness.  Musculoskeletal: He exhibits no edema.  Able to move all extremities   Lymphadenopathy:    He has no cervical adenopathy.  Neurological: He is alert and oriented to person, place, and time.  Skin: Skin is warm and dry. He is not diaphoretic.  Right outer ankle 2 ulcerations present #1 superficial red wound base #2:  scabbed over Has lower extremity discoloration    Psychiatric: He has a normal mood and affect.     ASSESSMENT/ PLAN:  Patient is being discharged with the following home health services:  Pt/ot/rn to evaluate and treat as indicated for gait balance strength adl training and medication management.   Patient is being discharged with the following durable medical equipment:  Tall front wheel walker in order to allow him to maintain his current level of independence with his adls; he will need a 3:1 commode and tub bench   Patient has been advised to f/u with their PCP in 1-2 weeks to bring them up to date on their rehab stay.  Social services at facility was responsible for arranging this appointment.  Pt was provided with a 30 day supply of prescriptions for medications and refills must be obtained from their PCP.  For controlled substances, a more limited supply may be provided adequate until PCP appointment only. #20 ultram 50 mg tabs   Time spent with patient  45  minutes >50% time spent counseling; reviewing medical record; tests; labs; and developing future plan of care   Ok Edwards NP [facility]  Contact [phone] Monday through Friday 8am- 5pm  After hours call [phone]

## 2016-09-08 ENCOUNTER — Ambulatory Visit (INDEPENDENT_AMBULATORY_CARE_PROVIDER_SITE_OTHER): Payer: Medicare Other | Admitting: Pharmacist Clinician (PhC)/ Clinical Pharmacy Specialist

## 2016-09-08 ENCOUNTER — Emergency Department (HOSPITAL_COMMUNITY)
Admission: EM | Admit: 2016-09-08 | Discharge: 2016-09-08 | Disposition: A | Discharge status: Home / Self Care | Payer: Medicare Other | Attending: Emergency Medicine | Admitting: Emergency Medicine

## 2016-09-08 ENCOUNTER — Telehealth: Payer: Self-pay | Admitting: Cardiology

## 2016-09-08 DIAGNOSIS — I252 Old myocardial infarction: Secondary | ICD-10-CM | POA: Diagnosis not present

## 2016-09-08 DIAGNOSIS — Z5181 Encounter for therapeutic drug level monitoring: Secondary | ICD-10-CM

## 2016-09-08 DIAGNOSIS — I4821 Permanent atrial fibrillation: Secondary | ICD-10-CM

## 2016-09-08 DIAGNOSIS — I5022 Chronic systolic (congestive) heart failure: Secondary | ICD-10-CM | POA: Insufficient documentation

## 2016-09-08 DIAGNOSIS — Z87891 Personal history of nicotine dependence: Secondary | ICD-10-CM | POA: Insufficient documentation

## 2016-09-08 DIAGNOSIS — Z7901 Long term (current) use of anticoagulants: Secondary | ICD-10-CM | POA: Diagnosis not present

## 2016-09-08 DIAGNOSIS — I482 Chronic atrial fibrillation: Secondary | ICD-10-CM

## 2016-09-08 DIAGNOSIS — Z951 Presence of aortocoronary bypass graft: Secondary | ICD-10-CM | POA: Insufficient documentation

## 2016-09-08 DIAGNOSIS — R040 Epistaxis: Secondary | ICD-10-CM | POA: Diagnosis not present

## 2016-09-08 DIAGNOSIS — I2510 Atherosclerotic heart disease of native coronary artery without angina pectoris: Secondary | ICD-10-CM | POA: Insufficient documentation

## 2016-09-08 DIAGNOSIS — I130 Hypertensive heart and chronic kidney disease with heart failure and stage 1 through stage 4 chronic kidney disease, or unspecified chronic kidney disease: Secondary | ICD-10-CM | POA: Insufficient documentation

## 2016-09-08 DIAGNOSIS — N183 Chronic kidney disease, stage 3 (moderate): Secondary | ICD-10-CM | POA: Insufficient documentation

## 2016-09-08 LAB — PROTIME-INR
INR: 2.26
PROTHROMBIN TIME: 25.4 s — AB (ref 11.4–15.2)

## 2016-09-08 LAB — CBC
HCT: 25.4 % — ABNORMAL LOW (ref 39.0–52.0)
HEMOGLOBIN: 8.3 g/dL — AB (ref 13.0–17.0)
MCH: 31.6 pg (ref 26.0–34.0)
MCHC: 32.7 g/dL (ref 30.0–36.0)
MCV: 96.6 fL (ref 78.0–100.0)
Platelets: 227 10*3/uL (ref 150–400)
RBC: 2.63 MIL/uL — AB (ref 4.22–5.81)
RDW: 18 % — ABNORMAL HIGH (ref 11.5–15.5)
WBC: 9.6 10*3/uL (ref 4.0–10.5)

## 2016-09-08 LAB — POCT INR: INR: 2.2

## 2016-09-08 NOTE — ED Triage Notes (Addendum)
Per EMS, pt is from home and called after experiencing a nosebleed that started suddenly at 0530 this morning. EMS reports pt losing 300-400 cc of blood on the scene. Pt used afrin with no relief. Pt reports having a similar episode 2-3 weeks ago at cardiac rehab. Coumadin was adjusted and no cauterization was done. EMS states that bleeding has slowed down. Pt denies n/v, dizziness, and pain. Pt has a hx of CHF, HTN, CABG.

## 2016-09-08 NOTE — Telephone Encounter (Signed)
She need orders for this pt please.

## 2016-09-08 NOTE — ED Notes (Signed)
Bed: OZ30 Expected date: 09/08/16 Expected time: 7:25 AM Means of arrival: Ambulance Comments: Nose Bleed

## 2016-09-08 NOTE — Telephone Encounter (Signed)
Returned call to [place] with [facility] she will fax orders for Dr.Jordan to sign.

## 2016-09-08 NOTE — ED Provider Notes (Signed)
[facility] DEPT Provider Note   CSN: [id number] Arrival date & time: 09/08/16  4098     History   Chief Complaint Chief Complaint  Patient presents with  . Epistaxis    HPI Jason Choi is a 75 y.o. male.  HPI Patient presents to the emergency department with complaints of right-sided nosebleed.  He was able to control this at home with pressure.  He states it is no longer bleeding at this time.  He had a prior nosebleed 2 weeks ago.  He is had no intermittent bleeding since then.  He is on Coumadin and his last INR was 3.5.  His team is made some alterations in his medications.  No recent upper respiratory symptoms.  No trauma or injury.  No bleeding at this time.  No chest pain shortness of breath.  Denies lightheadedness or weakness.   Past Medical History:  Diagnosis Date  . Anemia    Referral to GI Feb 2013  . Atrial fibrillation ([place])    ON AMIODARONE: PER VISIT NOTE IN [facility]  . Cataract immature    BILATERAL  . Chronic anticoagulation    on coumadin  . Chronic kidney disease (CKD), stage II (mild)   . High risk medication use    on amiodarone  . HTN (hypertension)   . Hypercholesteremia   . LV dysfunction    EF 35 to 40% per echo May 2012; EF remains 35 to 40% per echo Feb 2013. Referred for ICD  . Myocardial infarction   . Osteoarthritis of right shoulder region    ACUTE PAIN  . 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 Pulmonary hypertension, moderate to severe   . Rotator cuff tear 2012  . S/P CABG (coronary artery bypass graft) 2002   SVG to PDA, Free radial to Intermediate, LIMA to LAD by Dr Prescott Gum    Patient Active Problem List   Diagnosis Date Noted  . Gout 08/16/2016  . Syncope 07/26/2016  . Encounter for therapeutic drug monitoring 07/08/2013  . Hyponatremia 01/03/2012  . CKD (chronic kidney disease) stage 3, GFR 30-59 ml/min   . Anemia   . CAD (coronary artery disease)   . HTN (hypertension)   . Hypercholesteremia     . Chronic anticoagulation   . Chronic systolic CHF (congestive heart failure) ([place])   . Permanent atrial fibrillation ([place]) 08/22/2010    Past Surgical History:  Procedure Laterality Date  . CARDIAC CATHETERIZATION  2009   Grafts patent  . CARDIAC CATHETERIZATION  2012   Grafts patent. EF was 25 to 30% but 35 to 40 by echo  . CARDIAC CATHETERIZATION  2003  . CARDIOVERSION  2008--  FOR PAF   SUCCESSFUL  . CARDIOVERSION N/A 06/13/2013   Procedure: CARDIOVERSION;  Surgeon: Peter M Martinique, MD;  Location: [facility] ENDOSCOPY;  Service: Cardiovascular;  Laterality: N/A;  . CORONARY ARTERY BYPASS GRAFT  2002   X3 VESSEL  . Left Knee Arthroscopy  2000  . RIGHT HEART CATH N/A 08/08/2016   Procedure: Right Heart Cath;  Surgeon: Larey Dresser, MD;  Location: [facility] CV LAB;  Service: Cardiovascular;  Laterality: N/A;  . Right Leg surgeries     due to polio  . right shoulder rotator cuffe repair  05/16/11       Home Medications    Prior to Admission medications   Medication Sig Start Date End Date Taking? Authorizing Provider  acetaminophen (TYLENOL) 325 MG tablet Take  2 tablets (650 mg total) by mouth every 4 (four) hours as needed for headache or mild pain. 08/10/16   Shirley Friar, PA-C  allopurinol (ZYLOPRIM) 100 MG tablet Take 1 tablet (100 mg total) by mouth daily. 08/16/16   Shirley Friar, PA-C  Amino Acids-Protein Hydrolys (FEEDING SUPPLEMENT, PRO-STAT SUGAR FREE 64,) LIQD Take 30 mLs by mouth 2 (two) times daily.    Historical Provider, MD  atorvastatin (LIPITOR) 40 MG tablet Take 40 mg by mouth every evening.    Historical Provider, MD  CALCIUM ALGINATE EX Apply topically. Apply to right outer ankle topically every other day    Historical Provider, MD  colchicine 0.6 MG tablet Take 0.6 mg by mouth daily.    Historical Provider, MD  digoxin (LANOXIN) 0.125 MG tablet Take 1 tablet (0.125 mg total) by mouth daily. 08/11/16   Satira Mccallum Tillery, PA-C  KLOR-CON M20  20 MEQ tablet TAKE 1 TABLET (20 MEQ TOTAL) BY MOUTH DAILY. 07/24/16   Peter M Martinique, MD  metoprolol succinate (TOPROL-XL) 25 MG 24 hr tablet Take 1 tablet (25 mg total) by mouth daily. 08/11/16   Shirley Friar, PA-C  Multiple Vitamin (MULTI-VITAMIN DAILY) TABS Take 1 tablet by mouth daily.    Historical Provider, MD  Skin Protectants, Misc. (CALAZIME SKIN PROTECTANT EX) Apply to sacrum topically daily    Historical Provider, MD  spironolactone (ALDACTONE) 25 MG tablet Take 0.5 tablets (12.5 mg total) by mouth daily. 08/11/16   Shirley Friar, PA-C  tolnaftate (TINACTIN) 1 % cream Apply to groin area topically daily    Historical Provider, MD  torsemide (DEMADEX) 20 MG tablet Take 3 tablets (60 mg total) by mouth daily. 08/11/16   Shirley Friar, PA-C  traMADol (ULTRAM) 50 MG tablet Take 1 tablet (50 mg total) by mouth every 12 (twelve) hours as needed for severe pain. 08/10/16   Lauree Chandler, NP  traZODone (DESYREL) 50 MG tablet Take 1 tablet (50 mg total) by mouth at bedtime as needed for sleep. 08/10/16   Shirley Friar, PA-C  vitamin C (ASCORBIC ACID) 500 MG tablet Take 500 mg by mouth 2 (two) times daily.    Historical Provider, MD  warfarin (COUMADIN) 6 MG tablet Take 6 mg by mouth every evening.     Historical Provider, MD  zinc sulfate 220 (50 Zn) MG capsule Take 220 mg by mouth daily. 08/22/16 10/06/16  Historical Provider, MD    Family History Family History  Problem Relation Age of Onset  . Valvular heart disease Father   . Colon cancer Neg Hx   . Colon polyps Neg Hx   . Rectal cancer Neg Hx   . Stomach cancer Neg Hx     Social History Social History  Substance Use Topics  . Smoking status: Former Smoker    Types: Cigarettes    Quit date: 05/12/1979  . Smokeless tobacco: Never Used     Comment: stopped smoking over 30 years ago.  . Alcohol use 8.4 oz/week    14 Cans of beer per week     Comment: couple beers and a shot of crown royal most  days     Allergies   Lidocaine and Procaine hcl   Review of Systems Review of Systems  All other systems reviewed and are negative.    Physical Exam Updated Vital Signs BP 113/66 (BP Location: Left Arm)   Pulse 67   Temp 98.2 F (36.8 C) (Oral)   Resp 15  SpO2 100%   Physical Exam  Constitutional: He is oriented to person, place, and time. He appears well-developed and well-nourished.  HENT:  Head: Normocephalic.  Stigmata of recent anterior nosebleed on the right and air without active bleeding at this time.  Posterior pharynx is normal without active bleeding  Eyes: EOM are normal.  Neck: Normal range of motion.  Pulmonary/Chest: Effort normal.  Abdominal: He exhibits no distension.  Musculoskeletal: Normal range of motion.  Neurological: He is alert and oriented to person, place, and time.  Psychiatric: He has a normal mood and affect.  Nursing note and vitals reviewed.    ED Treatments / Results  Labs (all labs ordered are listed, but only abnormal results are displayed) Labs Reviewed  PROTIME-INR - Abnormal; Notable for the following:       Result Value   Prothrombin Time 25.4 (*)    All other components within normal limits  CBC - Abnormal; Notable for the following:    RBC 2.63 (*)    Hemoglobin 8.3 (*)    HCT 25.4 (*)    RDW 18.0 (*)    All other components within normal limits    EKG  EKG Interpretation None       Radiology No results found.  Procedures Procedures (including critical care time)  Medications Ordered in ED Medications - No data to display   Initial Impression / Assessment and Plan / ED Course  I have reviewed the triage vital signs and the nursing notes.  Pertinent labs & imaging results that were available during my care of the patient were reviewed by me and considered in my medical decision making (see chart for details).     No active bleeding at this time.  Patient be discharged home to follow-up with ENT.   His INR is therapeutic today  Final Clinical Impressions(s) / ED Diagnoses   Final diagnoses:  Right-sided epistaxis    New Prescriptions New Prescriptions   No medications on file     Jola Schmidt, MD 09/08/16 [phone]

## 2016-09-11 ENCOUNTER — Telehealth: Payer: Self-pay | Admitting: Cardiology

## 2016-09-11 NOTE — Telephone Encounter (Signed)
Returned call to [place] with [facility] left message on personal voice mail Dr.Jordan ok to order PT.

## 2016-09-11 NOTE — Telephone Encounter (Signed)
New Message     Needs verbal order for 2x a week for PT for strengthening and day training

## 2016-09-11 NOTE — Telephone Encounter (Signed)
Will forward to dr Martinique for okay

## 2016-09-11 NOTE — Telephone Encounter (Signed)
OK to order PT  Peter Martinique MD, [facility]

## 2016-09-15 ENCOUNTER — Ambulatory Visit (INDEPENDENT_AMBULATORY_CARE_PROVIDER_SITE_OTHER): Payer: Medicare Other | Admitting: Pharmacist Clinician (PhC)/ Clinical Pharmacy Specialist

## 2016-09-15 DIAGNOSIS — I482 Chronic atrial fibrillation: Secondary | ICD-10-CM | POA: Diagnosis not present

## 2016-09-15 DIAGNOSIS — Z5181 Encounter for therapeutic drug level monitoring: Secondary | ICD-10-CM | POA: Diagnosis not present

## 2016-09-15 DIAGNOSIS — I2510 Atherosclerotic heart disease of native coronary artery without angina pectoris: Secondary | ICD-10-CM | POA: Diagnosis not present

## 2016-09-15 DIAGNOSIS — I4821 Permanent atrial fibrillation: Secondary | ICD-10-CM

## 2016-09-15 LAB — POCT INR: INR: 3.4

## 2016-09-18 ENCOUNTER — Encounter (HOSPITAL_BASED_OUTPATIENT_CLINIC_OR_DEPARTMENT_OTHER): Payer: Medicare Other | Attending: Internal Medicine

## 2016-09-18 DIAGNOSIS — N189 Chronic kidney disease, unspecified: Secondary | ICD-10-CM | POA: Diagnosis not present

## 2016-09-18 DIAGNOSIS — I87311 Chronic venous hypertension (idiopathic) with ulcer of right lower extremity: Secondary | ICD-10-CM | POA: Insufficient documentation

## 2016-09-18 DIAGNOSIS — I252 Old myocardial infarction: Secondary | ICD-10-CM | POA: Insufficient documentation

## 2016-09-18 DIAGNOSIS — I129 Hypertensive chronic kidney disease with stage 1 through stage 4 chronic kidney disease, or unspecified chronic kidney disease: Secondary | ICD-10-CM | POA: Diagnosis not present

## 2016-09-18 DIAGNOSIS — L97312 Non-pressure chronic ulcer of right ankle with fat layer exposed: Secondary | ICD-10-CM | POA: Diagnosis not present

## 2016-09-18 DIAGNOSIS — Z8612 Personal history of poliomyelitis: Secondary | ICD-10-CM | POA: Insufficient documentation

## 2016-09-20 ENCOUNTER — Ambulatory Visit (HOSPITAL_COMMUNITY)
Admission: RE | Admit: 2016-09-20 | Discharge: 2016-09-20 | Disposition: A | Discharge status: Home / Self Care | Payer: Medicare Other | Source: Ambulatory Visit | Attending: Internal Medicine | Admitting: Internal Medicine

## 2016-09-20 VITALS — BP 110/58 | HR 64 | Wt 188.0 lb

## 2016-09-20 DIAGNOSIS — Z951 Presence of aortocoronary bypass graft: Secondary | ICD-10-CM | POA: Insufficient documentation

## 2016-09-20 DIAGNOSIS — R55 Syncope and collapse: Secondary | ICD-10-CM | POA: Insufficient documentation

## 2016-09-20 DIAGNOSIS — N184 Chronic kidney disease, stage 4 (severe): Secondary | ICD-10-CM | POA: Insufficient documentation

## 2016-09-20 DIAGNOSIS — M109 Gout, unspecified: Secondary | ICD-10-CM | POA: Diagnosis not present

## 2016-09-20 DIAGNOSIS — I5022 Chronic systolic (congestive) heart failure: Secondary | ICD-10-CM | POA: Diagnosis present

## 2016-09-20 DIAGNOSIS — I2510 Atherosclerotic heart disease of native coronary artery without angina pectoris: Secondary | ICD-10-CM | POA: Insufficient documentation

## 2016-09-20 DIAGNOSIS — L97519 Non-pressure chronic ulcer of other part of right foot with unspecified severity: Secondary | ICD-10-CM | POA: Diagnosis not present

## 2016-09-20 DIAGNOSIS — I482 Chronic atrial fibrillation: Secondary | ICD-10-CM | POA: Insufficient documentation

## 2016-09-20 LAB — BASIC METABOLIC PANEL
ANION GAP: 8 (ref 5–15)
BUN: 47 mg/dL — AB (ref 6–20)
CALCIUM: 9 mg/dL (ref 8.9–10.3)
CHLORIDE: 104 mmol/L (ref 101–111)
CO2: 25 mmol/L (ref 22–32)
Creatinine, Ser: 2.21 mg/dL — ABNORMAL HIGH (ref 0.61–1.24)
GFR calc non Af Amer: 28 mL/min — ABNORMAL LOW (ref >60)
GFR, EST AFRICAN AMERICAN: 32 mL/min — AB (ref >60)
Glucose, Bld: 116 mg/dL — ABNORMAL HIGH (ref 65–99)
Potassium: 4.1 mmol/L (ref 3.5–5.1)
Sodium: 137 mmol/L (ref 135–145)

## 2016-09-20 LAB — BRAIN NATRIURETIC PEPTIDE: B NATRIURETIC PEPTIDE 5: 1989.5 pg/mL — AB (ref 0.0–100.0)
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
## 2016-09-20 NOTE — Patient Instructions (Addendum)
It was great to see you today!  Please take additional torsemide 60 mg today then continue torsemide 60 mg daily.   Labs today. We will call you with any abnormalities.   We will refer you to Electrophysiology for consideration of ICD.  Please keep your appointment with Dr. [name] on 10/13/16.

## 2016-09-21 ENCOUNTER — Other Ambulatory Visit (HOSPITAL_COMMUNITY): Payer: Self-pay | Admitting: Pharmacist

## 2016-09-21 MED ORDER — ATORVASTATIN CALCIUM 40 MG PO TABS: 40.0000 mg | ORAL_TABLET | Freq: Every evening | ORAL | 3 refills | Status: DC

## 2016-09-21 MED ORDER — POTASSIUM CHLORIDE CRYS ER 20 MEQ PO TBCR: 20.0000 meq | EXTENDED_RELEASE_TABLET | Freq: Every day | ORAL | 3 refills | Status: DC

## 2016-09-21 MED ORDER — SPIRONOLACTONE 25 MG PO TABS: 12.5000 mg | ORAL_TABLET | Freq: Every day | ORAL | 3 refills | Status: DC

## 2016-09-21 MED ORDER — DIGOXIN 125 MCG PO TABS: 0.1250 mg | ORAL_TABLET | Freq: Every day | ORAL | 3 refills | Status: DC

## 2016-09-21 MED ORDER — METOPROLOL SUCCINATE ER 25 MG PO TB24: 25.0000 mg | ORAL_TABLET | Freq: Every day | ORAL | 3 refills | Status: DC

## 2016-09-21 MED ORDER — TORSEMIDE 20 MG PO TABS: 60.0000 mg | ORAL_TABLET | Freq: Every day | ORAL | 3 refills | Status: DC

## 2016-09-21 MED ORDER — ALLOPURINOL 100 MG PO TABS: 100.0000 mg | ORAL_TABLET | Freq: Every day | ORAL | 3 refills | Status: AC

## 2016-09-25 ENCOUNTER — Ambulatory Visit (INDEPENDENT_AMBULATORY_CARE_PROVIDER_SITE_OTHER): Payer: Medicare Other | Admitting: Pharmacist

## 2016-09-25 DIAGNOSIS — Z5181 Encounter for therapeutic drug level monitoring: Secondary | ICD-10-CM

## 2016-09-25 DIAGNOSIS — I482 Chronic atrial fibrillation: Secondary | ICD-10-CM

## 2016-09-25 DIAGNOSIS — I4821 Permanent atrial fibrillation: Secondary | ICD-10-CM

## 2016-09-25 LAB — PROTIME-INR: INR: 2.9 — AB (ref <=1.1)

## 2016-10-01 ENCOUNTER — Other Ambulatory Visit: Payer: Self-pay | Admitting: Adult Health

## 2016-10-02 ENCOUNTER — Encounter (HOSPITAL_BASED_OUTPATIENT_CLINIC_OR_DEPARTMENT_OTHER): Payer: Medicare Other | Attending: Internal Medicine

## 2016-10-02 DIAGNOSIS — I110 Hypertensive heart disease with heart failure: Secondary | ICD-10-CM | POA: Diagnosis not present

## 2016-10-02 DIAGNOSIS — I87311 Chronic venous hypertension (idiopathic) with ulcer of right lower extremity: Secondary | ICD-10-CM | POA: Diagnosis not present

## 2016-10-02 DIAGNOSIS — H269 Unspecified cataract: Secondary | ICD-10-CM | POA: Diagnosis not present

## 2016-10-02 DIAGNOSIS — D649 Anemia, unspecified: Secondary | ICD-10-CM | POA: Insufficient documentation

## 2016-10-02 DIAGNOSIS — L97513 Non-pressure chronic ulcer of other part of right foot with necrosis of muscle: Secondary | ICD-10-CM | POA: Insufficient documentation

## 2016-10-02 DIAGNOSIS — M109 Gout, unspecified: Secondary | ICD-10-CM | POA: Insufficient documentation

## 2016-10-02 DIAGNOSIS — I252 Old myocardial infarction: Secondary | ICD-10-CM | POA: Insufficient documentation

## 2016-10-02 DIAGNOSIS — Z8612 Personal history of poliomyelitis: Secondary | ICD-10-CM | POA: Insufficient documentation

## 2016-10-02 DIAGNOSIS — M1990 Unspecified osteoarthritis, unspecified site: Secondary | ICD-10-CM | POA: Insufficient documentation

## 2016-10-02 DIAGNOSIS — I2510 Atherosclerotic heart disease of native coronary artery without angina pectoris: Secondary | ICD-10-CM | POA: Insufficient documentation

## 2016-10-02 DIAGNOSIS — I509 Heart failure, unspecified: Secondary | ICD-10-CM | POA: Diagnosis not present

## 2016-10-09 ENCOUNTER — Telehealth: Payer: Self-pay | Admitting: Cardiology

## 2016-10-09 ENCOUNTER — Ambulatory Visit (INDEPENDENT_AMBULATORY_CARE_PROVIDER_SITE_OTHER): Payer: Medicare Other | Admitting: Pharmacist Clinician (PhC)/ Clinical Pharmacy Specialist

## 2016-10-09 DIAGNOSIS — I4821 Permanent atrial fibrillation: Secondary | ICD-10-CM

## 2016-10-09 DIAGNOSIS — Z5181 Encounter for therapeutic drug level monitoring: Secondary | ICD-10-CM

## 2016-10-09 DIAGNOSIS — I482 Chronic atrial fibrillation: Secondary | ICD-10-CM

## 2016-10-09 LAB — POCT INR: INR: 2.7

## 2016-10-09 NOTE — Telephone Encounter (Signed)
Spoke with Ebony Hail and Dr Martinique is the physician who continued PT after being d/c from facility.  Verbal ok given to continue for 4 more weeks as requested

## 2016-10-09 NOTE — Telephone Encounter (Signed)
OK to extend PT>  Rune Mendez MD, FACC   

## 2016-10-09 NOTE — Telephone Encounter (Signed)
New message      Calling to get verbal orders to extend home health PT for 2 weeks

## 2016-10-09 NOTE — Telephone Encounter (Signed)
Left message to call back  

## 2016-10-10 ENCOUNTER — Ambulatory Visit (INDEPENDENT_AMBULATORY_CARE_PROVIDER_SITE_OTHER): Payer: Medicare Other | Admitting: Cardiology

## 2016-10-10 ENCOUNTER — Encounter: Payer: Self-pay | Admitting: *Deleted

## 2016-10-10 ENCOUNTER — Encounter: Payer: Self-pay | Admitting: Cardiology

## 2016-10-10 ENCOUNTER — Other Ambulatory Visit: Payer: Self-pay | Admitting: Cardiology

## 2016-10-10 VITALS — BP 96/58 | HR 65 | Ht 75.0 in | Wt 181.7 lb

## 2016-10-10 DIAGNOSIS — I482 Chronic atrial fibrillation: Secondary | ICD-10-CM

## 2016-10-10 DIAGNOSIS — I255 Ischemic cardiomyopathy: Secondary | ICD-10-CM

## 2016-10-10 DIAGNOSIS — I5022 Chronic systolic (congestive) heart failure: Secondary | ICD-10-CM

## 2016-10-10 DIAGNOSIS — I4821 Permanent atrial fibrillation: Secondary | ICD-10-CM

## 2016-10-10 DIAGNOSIS — Z01812 Encounter for preprocedural laboratory examination: Secondary | ICD-10-CM | POA: Diagnosis not present

## 2016-10-10 LAB — CBC WITH DIFFERENTIAL/PLATELET
BASOS ABS: 0.1 10*3/uL (ref 0.0–0.2)
BASOS: 1 %
EOS (ABSOLUTE): 0.5 10*3/uL — AB (ref 0.0–0.4)
Eos: 6 %
Hematocrit: 28.5 % — ABNORMAL LOW (ref 37.5–51.0)
Hemoglobin: 8.8 g/dL — ABNORMAL LOW (ref 13.0–17.7)
IMMATURE GRANULOCYTES: 0 %
Immature Grans (Abs): 0 10*3/uL (ref 0.0–0.1)
Lymphocytes Absolute: 1.2 10*3/uL (ref 0.7–3.1)
Lymphs: 14 %
MCH: 29.3 pg (ref 26.6–33.0)
MCHC: 30.9 g/dL — ABNORMAL LOW (ref 31.5–35.7)
MCV: 95 fL (ref 79–97)
MONOS ABS: 0.9 10*3/uL (ref 0.1–0.9)
Monocytes: 10 %
NEUTROS PCT: 69 %
Neutrophils Absolute: 6.2 10*3/uL (ref 1.4–7.0)
PLATELETS: 349 10*3/uL (ref 150–379)
RBC: 3 x10E6/uL — AB (ref 4.14–5.80)
RDW: 17.5 % — AB (ref 12.3–15.4)
WBC: 9 10*3/uL (ref 3.4–10.8)

## 2016-10-10 LAB — BASIC METABOLIC PANEL
BUN/Creatinine Ratio: 25 — ABNORMAL HIGH (ref 10–24)
BUN: 49 mg/dL — ABNORMAL HIGH (ref 8–27)
CHLORIDE: 98 mmol/L (ref 96–106)
CO2: 20 mmol/L (ref 18–29)
Calcium: 9.6 mg/dL (ref 8.6–10.2)
Creatinine, Ser: 1.99 mg/dL — ABNORMAL HIGH (ref 0.76–1.27)
GFR calc Af Amer: 37 mL/min/{1.73_m2} — ABNORMAL LOW (ref >59)
GFR calc non Af Amer: 32 mL/min/{1.73_m2} — ABNORMAL LOW (ref >59)
GLUCOSE: 103 mg/dL — AB (ref 65–99)
POTASSIUM: 4.7 mmol/L (ref 3.5–5.2)
SODIUM: 139 mmol/L (ref 134–144)

## 2016-10-10 LAB — PROTIME-INR
INR: 2.4 — ABNORMAL HIGH (ref 0.8–1.2)
PROTHROMBIN TIME: 24.4 s — AB (ref 9.1–12.0)
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
## 2016-10-10 NOTE — Patient Instructions (Signed)
Medication Instructions:    Your physician recommends that you continue on your current medications as directed. Please refer to the Current Medication list given to you today.  --- If you need a refill on your cardiac medications before your next appointment, please call your pharmacy. ---  Labwork:  Pre procedure labs today: BMET, CBC w/ diff & INR  Testing/Procedures: Your physician has recommended that you have a defibrillator inserted. An implantable cardioverter defibrillator (ICD) is a small device that is placed in your chest or, in rare cases, your abdomen. This device uses electrical pulses or shocks to help control life-threatening, irregular heartbeats that could lead the heart to suddenly stop beating (sudden cardiac arrest). Leads are attached to the ICD that goes into your heart. This is done in the hospital and usually requires an overnight stay. Please see the instruction sheet given to you today for more information.  Follow-Up:  Your physician recommends that you schedule a wound check appointment, 10-14 days after your procedure on 10/12/16, with the device clinic.   Your physician recommends that you schedule a follow up appointment in 3 months, after your procedure on 10/12/16, with Dr. Curt Bears.  Thank you for choosing [facility]!!   Trinidad Curet, RN [phone]  Any Other Special Instructions Will Be Listed Below (If Applicable).   Cardioverter Defibrillator Implantation An implantable cardioverter defibrillator (ICD) is a small, lightweight, battery-powered device that is placed (implanted) under the skin in the chest or abdomen. Your caregiver may prescribe an ICD if:  You have had an irregular heart rhythm (arrhythmia) that originated in the lower chambers of the heart (ventricles).  Your heart has been damaged by a disease (such as coronary artery disease) or heart condition (such as a heart attack). An ICD consists of a battery that lasts several  years, a small computer called a pulse generator, and wires called leads that go into the heart. It is used to detect and correct two dangerous arrhythmias: a rapid heart rhythm (tachycardia) and an arrhythmia in which the ventricles contract in an uncoordinated way (fibrillation). When an ICD detects tachycardia, it sends an electrical signal to the heart that restores the heartbeat to normal (cardioversion). This signal is usually painless. If cardioversion does not work or if the ICD detects fibrillation, it delivers a small electrical shock to the heart (defibrillation) to restart the heart. The shock may feel like a strong jolt in the chest.ICDs may be programmed to correct other problems. Sometimes, ICDs are programmed to act as another type of implantable device called a pacemaker. Pacemakers are used to treat a slow heartbeat (bradycardia). LET YOUR CAREGIVER KNOW ABOUT:  Any allergies you have.  All medicines you are taking, including vitamins, herbs, eyedrops, and over-the-counter medicines and creams.  Previous problems you or members of your family have had with the use of anesthetics.  Any blood disorders you have had.  Other health problems you have. RISKS AND COMPLICATIONS Generally, the procedure to implant an ICD is safe. However, as with any surgical procedure, complications can occur. Possible complications associated with implanting an ICD include:  Swelling, bleeding, or bruising at the site where the ICD was implanted.  Infection at the site where the ICD was implanted.  A reaction to medicine used during the procedure.  Nerve, heart, or blood vessel damage.  Blood clots. BEFORE THE PROCEDURE  You may need to have blood tests, heart tests, or a chest X-ray done before the day of the procedure.  Ask  your caregiver about changing or stopping your regular medicines.  Make plans to have someone drive you home. You may need to stay in the hospital overnight after the  procedure.  Stop smoking at least 24 hours before the procedure.  Take a bath or shower the night before the procedure. You may need to scrub your chest or abdomen with a special type of soap.  Do not eat or drink before your procedure for as long as directed by your caregiver. Ask if it is okay to take any needed medicine with a small sip of water. PROCEDURE  The procedure to implant an ICD in your chest or abdomen is usually done at a hospital in a room that has a large X-ray machine called a fluoroscope. The machine will be above you during the procedure. It will help your caregiver see your heart during the procedure. Implanting an ICD usually takes 1-3 hours. Before the procedure:   Small monitors will be put on your body. They will be used to check your heart, blood pressure, and oxygen level.  A needle will be put into a vein in your hand or arm. This is called an intravenous (IV) access tube. Fluids and medicine will flow directly into your body through the IV tube.  Your chest or abdomen will be cleaned with a germ-killing (antiseptic) solution. The area may be shaved.  You may be given medicine to help you relax (sedative).  You will be given a medicine called a local anesthetic. This medicine will make the surgical site numb while the ICD is implanted. You will be sleepy but awake during the procedure. After you are numb the procedure will begin. The caregiver will:  Make a small cut (incision). This will make a pocket deep under your skin that will hold the pulse generator.  Guide the leads through a large blood vessel into your heart and attach them to the heart muscles. Depending on the ICD, the leads may go into one ventricle or they may go to both ventricles and into an upper chamber of the heart (atrium).  Test the ICD.  Close the incision with stitches, glue, or staples. AFTER THE PROCEDURE  You may feel pain. Some pain is normal. It may last a few days.  You may  stay in a recovery area until the local anesthetic has worn off. Your blood pressure and pulse will be checked often. You will be taken to a room where your heart will be monitored.  A chest X-ray will be taken. This is done to check that the cardioverter defibrillator is in the right place.  You may stay in the hospital overnight.  A slight bump may be seen over the skin where the ICD was placed. Sometimes, it is possible to feel the ICD under the skin. This is normal.  In the months and years afterward, your caregiver will check the device, the leads, and the battery every few months. Eventually, when the battery is low, the ICD will be replaced.   This information is not intended to replace advice given to you by your health care provider. Make sure you discuss any questions you have with your health care provider.   Document Released: 02/04/2002 Document Revised: 03/05/2013 Document Reviewed: 06/03/2012 Elsevier Interactive Patient Education [employer].

## 2016-10-12 ENCOUNTER — Ambulatory Visit (HOSPITAL_COMMUNITY)
Admission: RE | Admit: 2016-10-12 | Discharge: 2016-10-13 | Disposition: A | Discharge status: Home / Self Care | Payer: Medicare Other | Source: Ambulatory Visit | Attending: Cardiology | Admitting: Cardiology

## 2016-10-12 ENCOUNTER — Encounter (HOSPITAL_COMMUNITY): Payer: Self-pay | Admitting: *Deleted

## 2016-10-12 ENCOUNTER — Encounter (HOSPITAL_COMMUNITY)
Admission: RE | Disposition: A | Discharge status: Home / Self Care | Payer: Self-pay | Source: Ambulatory Visit | Attending: Cardiology

## 2016-10-12 DIAGNOSIS — E785 Hyperlipidemia, unspecified: Secondary | ICD-10-CM | POA: Insufficient documentation

## 2016-10-12 DIAGNOSIS — I5022 Chronic systolic (congestive) heart failure: Secondary | ICD-10-CM | POA: Diagnosis not present

## 2016-10-12 DIAGNOSIS — I2510 Atherosclerotic heart disease of native coronary artery without angina pectoris: Secondary | ICD-10-CM | POA: Insufficient documentation

## 2016-10-12 DIAGNOSIS — I130 Hypertensive heart and chronic kidney disease with heart failure and stage 1 through stage 4 chronic kidney disease, or unspecified chronic kidney disease: Secondary | ICD-10-CM | POA: Insufficient documentation

## 2016-10-12 DIAGNOSIS — N189 Chronic kidney disease, unspecified: Secondary | ICD-10-CM | POA: Insufficient documentation

## 2016-10-12 DIAGNOSIS — I252 Old myocardial infarction: Secondary | ICD-10-CM | POA: Diagnosis not present

## 2016-10-12 DIAGNOSIS — I482 Chronic atrial fibrillation: Secondary | ICD-10-CM | POA: Insufficient documentation

## 2016-10-12 DIAGNOSIS — Z7901 Long term (current) use of anticoagulants: Secondary | ICD-10-CM | POA: Diagnosis not present

## 2016-10-12 DIAGNOSIS — I700 Atherosclerosis of aorta: Secondary | ICD-10-CM | POA: Diagnosis not present

## 2016-10-12 DIAGNOSIS — Z7982 Long term (current) use of aspirin: Secondary | ICD-10-CM | POA: Diagnosis not present

## 2016-10-12 DIAGNOSIS — Z006 Encounter for examination for normal comparison and control in clinical research program: Secondary | ICD-10-CM | POA: Diagnosis not present

## 2016-10-12 DIAGNOSIS — I509 Heart failure, unspecified: Secondary | ICD-10-CM

## 2016-10-12 DIAGNOSIS — Z95818 Presence of other cardiac implants and grafts: Secondary | ICD-10-CM

## 2016-10-12 DIAGNOSIS — I255 Ischemic cardiomyopathy: Secondary | ICD-10-CM | POA: Diagnosis not present

## 2016-10-12 HISTORY — PX: CARDIAC DEFIBRILLATOR PLACEMENT: SHX171

## 2016-10-12 HISTORY — DX: Presence of automatic (implantable) cardiac defibrillator: Z95.810

## 2016-10-12 HISTORY — PX: ICD IMPLANT: EP1208

## 2016-10-12 HISTORY — DX: Gout, unspecified: M10.9

## 2016-10-12 HISTORY — DX: Unspecified osteoarthritis, unspecified site: M19.90

## 2016-10-12 LAB — SURGICAL PCR SCREEN
MRSA, PCR: NEGATIVE
STAPHYLOCOCCUS AUREUS: POSITIVE — AB

## 2016-10-12 LAB — PROTIME-INR
INR: 2.21
Prothrombin Time: 24.9 seconds — ABNORMAL HIGH (ref 11.4–15.2)

## 2016-10-12 SURGERY — ICD IMPLANT

## 2016-10-12 MED ORDER — YOU HAVE A PACEMAKER BOOK
Freq: Once | Status: AC
  Administered 2016-10-12: 20:00:00
  Filled 2016-10-12: qty 1

## 2016-10-12 MED ORDER — POTASSIUM CHLORIDE CRYS ER 20 MEQ PO TBCR
20.0000 meq | EXTENDED_RELEASE_TABLET | Freq: Every day | ORAL | Status: DC
  Filled 2016-10-12: qty 1

## 2016-10-12 MED ORDER — METOPROLOL SUCCINATE ER 25 MG PO TB24
25.0000 mg | ORAL_TABLET | Freq: Every day | ORAL | Status: DC
  Filled 2016-10-12: qty 1

## 2016-10-12 MED ORDER — SODIUM CHLORIDE 0.9 % IV SOLN
INTRAVENOUS | Status: DC
Start: 2016-10-12 — End: 2016-10-12
  Administered 2016-10-12: 10:00:00 via INTRAVENOUS

## 2016-10-12 MED ORDER — ACETAMINOPHEN 325 MG PO TABS: 650.0000 mg | ORAL_TABLET | ORAL | Status: DC | PRN

## 2016-10-12 MED ORDER — DIGOXIN 125 MCG PO TABS
0.1250 mg | ORAL_TABLET | Freq: Every day | ORAL | Status: DC
  Filled 2016-10-12: qty 1

## 2016-10-12 MED ORDER — HEPARIN (PORCINE) IN NACL 2-0.9 UNIT/ML-% IJ SOLN
INTRAMUSCULAR | Status: AC
Start: 2016-10-12 — End: 2016-10-12
  Filled 2016-10-12: qty 500

## 2016-10-12 MED ORDER — BUPIVACAINE HCL (PF) 0.25 % IJ SOLN
INTRAMUSCULAR | Status: DC | PRN
  Administered 2016-10-12: 40 mL

## 2016-10-12 MED ORDER — SPIRONOLACTONE 12.5 MG HALF TABLET
12.5000 mg | ORAL_TABLET | Freq: Every day | ORAL | Status: DC
  Administered 2016-10-12: 12.5 mg via ORAL
  Filled 2016-10-12 (×3): qty 1

## 2016-10-12 MED ORDER — CHLORHEXIDINE GLUCONATE CLOTH 2 % EX PADS
6.0000 | MEDICATED_PAD | Freq: Every day | CUTANEOUS | Status: DC
  Administered 2016-10-12: 21:00:00 6 via TOPICAL

## 2016-10-12 MED ORDER — ATORVASTATIN CALCIUM 40 MG PO TABS: 40.0000 mg | ORAL_TABLET | Freq: Every evening | ORAL | Status: DC

## 2016-10-12 MED ORDER — WARFARIN SODIUM 3 MG PO TABS: 3.0000 mg | ORAL_TABLET | Freq: Every day | ORAL | Status: DC

## 2016-10-12 MED ORDER — MUPIROCIN 2 % EX OINT
1.0000 "application " | TOPICAL_OINTMENT | Freq: Two times a day (BID) | CUTANEOUS | Status: DC
  Administered 2016-10-12: 1 via NASAL

## 2016-10-12 MED ORDER — ONDANSETRON HCL 4 MG/2ML IJ SOLN: 4.0000 mg | Freq: Four times a day (QID) | INTRAMUSCULAR | Status: DC | PRN

## 2016-10-12 MED ORDER — CEFAZOLIN SODIUM-DEXTROSE 1-4 GM/50ML-% IV SOLN
1.0000 g | Freq: Four times a day (QID) | INTRAVENOUS | Status: AC
  Administered 2016-10-13 (×2): 1 g via INTRAVENOUS
  Filled 2016-10-12 (×3): qty 50

## 2016-10-12 MED ORDER — GENTAMICIN SULFATE 40 MG/ML IJ SOLN
INTRAMUSCULAR | Status: AC
  Filled 2016-10-12: qty 2

## 2016-10-12 MED ORDER — OFF THE BEAT BOOK
Freq: Once | Status: AC
  Administered 2016-10-12: 20:00:00
  Filled 2016-10-12: qty 1

## 2016-10-12 MED ORDER — TORSEMIDE 20 MG PO TABS
60.0000 mg | ORAL_TABLET | Freq: Every day | ORAL | Status: DC
  Administered 2016-10-12: 60 mg via ORAL
  Filled 2016-10-12 (×2): qty 3

## 2016-10-12 MED ORDER — MIDAZOLAM HCL 5 MG/5ML IJ SOLN
INTRAMUSCULAR | Status: DC | PRN
  Administered 2016-10-12: 1 mg via INTRAVENOUS

## 2016-10-12 MED ORDER — FENTANYL CITRATE (PF) 100 MCG/2ML IJ SOLN
INTRAMUSCULAR | Status: AC
  Filled 2016-10-12: qty 2

## 2016-10-12 MED ORDER — LIDOCAINE HCL (PF) 1 % IJ SOLN
INTRAMUSCULAR | Status: AC
  Filled 2016-10-12: qty 60

## 2016-10-12 MED ORDER — GENTAMICIN SULFATE 40 MG/ML IJ SOLN
80.0000 mg | INTRAMUSCULAR | Status: AC
  Administered 2016-10-12: 80 mg

## 2016-10-12 MED ORDER — WARFARIN - PHYSICIAN DOSING INPATIENT
Freq: Every day | Status: DC
  Administered 2016-10-12: 20:00:00

## 2016-10-12 MED ORDER — FENTANYL CITRATE (PF) 100 MCG/2ML IJ SOLN
INTRAMUSCULAR | Status: DC | PRN
  Administered 2016-10-12: 25 ug via INTRAVENOUS

## 2016-10-12 MED ORDER — ADULT MULTIVITAMIN W/MINERALS CH
1.0000 | ORAL_TABLET | Freq: Every day | ORAL | Status: DC
  Filled 2016-10-12: qty 1

## 2016-10-12 MED ORDER — HEPARIN (PORCINE) IN NACL 2-0.9 UNIT/ML-% IJ SOLN
INTRAMUSCULAR | Status: AC | PRN
  Administered 2016-10-12: 500 mL

## 2016-10-12 MED ORDER — MUPIROCIN 2 % EX OINT
TOPICAL_OINTMENT | CUTANEOUS | Status: AC
  Administered 2016-10-12: 1
  Filled 2016-10-12: qty 22

## 2016-10-12 MED ORDER — ALLOPURINOL 100 MG PO TABS
100.0000 mg | ORAL_TABLET | Freq: Every day | ORAL | Status: DC
  Filled 2016-10-12: qty 1

## 2016-10-12 MED ORDER — MIDAZOLAM HCL 5 MG/5ML IJ SOLN
INTRAMUSCULAR | Status: AC
  Filled 2016-10-12: qty 5

## 2016-10-12 MED ORDER — ASPIRIN EC 81 MG PO TBEC
81.0000 mg | DELAYED_RELEASE_TABLET | Freq: Every day | ORAL | Status: DC
  Filled 2016-10-12: qty 1

## 2016-10-12 MED ORDER — WARFARIN SODIUM 6 MG PO TABS
6.0000 mg | ORAL_TABLET | ORAL | Status: DC
  Administered 2016-10-12: 21:00:00 6 mg via ORAL
  Filled 2016-10-12 (×2): qty 1

## 2016-10-12 MED ORDER — CEFAZOLIN SODIUM-DEXTROSE 2-4 GM/100ML-% IV SOLN
INTRAVENOUS | Status: AC
Start: 2016-10-12 — End: 2016-10-12
  Filled 2016-10-12: qty 100

## 2016-10-12 MED ORDER — BUPIVACAINE HCL (PF) 0.25 % IJ SOLN
INTRAMUSCULAR | Status: AC
  Filled 2016-10-12: qty 60

## 2016-10-12 MED ORDER — CEFAZOLIN SODIUM-DEXTROSE 2-4 GM/100ML-% IV SOLN
2.0000 g | INTRAVENOUS | Status: AC
  Administered 2016-10-12: 2 g via INTRAVENOUS

## 2016-10-12 SURGICAL SUPPLY — 7 items
CABLE SURGICAL S-101-97-12 (CABLE) ×2 IMPLANT
ICD VISIA MRI VR DVFB1D4 (ICD Generator) IMPLANT
LEAD SPRINT QUAT SEC 6935M-62 (Lead) ×2 IMPLANT
PAD DEFIB LIFELINK (PAD) ×2 IMPLANT
SHEATH CLASSIC 9F (SHEATH) ×2 IMPLANT
TRAY PACEMAKER INSERTION (PACKS) ×2 IMPLANT
VISIA MRI VR DVFB1D4 (ICD Generator) ×3 IMPLANT

## 2016-10-12 NOTE — Discharge Summary (Signed)
ELECTROPHYSIOLOGY PROCEDURE DISCHARGE SUMMARY    Patient ID: Jason Choi,  MRN: [id number], DOB/AGE: 07/16/1940 76 y.o.  Admit date: 10/12/2016 Discharge date: 10/13/2016  Primary Care Physician: Heywood Bene, PA-C Primary Cardiologist: [place] Heart Failure: [name] Electrophysiologist: Curt Bears  Primary Discharge Diagnosis:  ICM s/p ICD implant this admission  Secondary Discharge Diagnosis:  1.  CAD 2.  Permanent AF 3.  CKD 4.  HTN 5.  Hyperlipidemia 6.  Prior syncope 07/2016  Allergies  Allergen Reactions  . Lidocaine Other (See Comments)    Passed out - at the dentist's office  . Procaine Hcl Other (See Comments)    Passed out - at the dentist's office     Procedures This Admission:  1.  Implantation of a MDT single chamber ICD on 10/12/16 by Dr Curt Bears.  The patient received a MDT model number Visia AF ICD with model number  0923 right ventricular lead.  DFT's were deferred at time of implant.  There were no immediate post procedure complications. 2.  CXR on 10/13/16 demonstrated no pneumothorax status post device implantation.   Brief HPI: Jason Choi is a 76 y.o. male was referred to electrophysiology in the outpatient setting for consideration of ICD implantation.  Past medical history includes ICM, syncope.  The patient has persistent LV dysfunction despite guideline directed therapy.  Risks, benefits, and alternatives to ICD implantation were reviewed with the patient who wished to proceed.   Hospital Course:  The patient was admitted and underwent implantation of a atrial MDT single chamber ICD with details as outlined above. He was monitored on telemetry overnight which demonstrated atrial fibrillation with controlled ventricular response.  Left chest was without hematoma or ecchymosis.  The device was interrogated and found to be functioning normally.  CXR was obtained and demonstrated no pneumothorax status post device implantation.  Wound  care, arm mobility, and restrictions were reviewed with the patient.  The patient was examined and considered stable for discharge to home.   The patient's discharge medications include a beta blocker (Metoprolol).   No ACE-I/ARB 2/2 CKD  Physical Exam: Vitals:   10/12/16 1914 10/12/16 2000 10/13/16 0243 10/13/16 0720  BP: (!) 108/46  (!) 103/59 (!) 105/58  Pulse: (!) 48 (!) 48 (!) 59 (!) 57  Resp: 19 20 (!) 24 19  Temp: 97.9 F (36.6 C)  98 F (36.7 C) 98 F (36.7 C)  TempSrc: Oral  Axillary Oral  SpO2: 94% 98% 96% 100%  Weight:   184 lb 15.5 oz (83.9 kg)   Height:        GEN- The patient is elderly appearing, alert and oriented x 3 today.   HEENT: normocephalic, atraumatic; sclera clear, conjunctiva pink; hearing intact; oropharynx clear; neck supple  Lungs- Clear to ausculation bilaterally, normal work of breathing.  No wheezes, rales, rhonchi Heart- Irregular rate and rhythm  GI- soft, non-tender, non-distended, bowel sounds present  Extremities- no clubbing, cyanosis, or edema; DP/PT/radial pulses 2+ bilaterally MS- no significant deformity or atrophy Skin- warm and dry, no rash or lesion, left chest without hematoma/ecchymosis Psych- euthymic mood, full affect Neuro- strength and sensation are intact   Labs:   Lab Results  Component Value Date   WBC 9.0 10/10/2016   HGB 8.3 (L) 09/08/2016   HCT 28.5 (L) 10/10/2016   MCV 95 10/10/2016   PLT 349 10/10/2016     Recent Labs Lab 10/10/16 1120  NA 139  K 4.7  CL 98  CO2  20  BUN 49*  CREATININE 1.99*  CALCIUM 9.6  GLUCOSE 103*    Discharge Medications:  Allergies as of 10/13/2016      Reactions   Lidocaine Other (See Comments)   Passed out - at the dentist's office   Procaine Hcl Other (See Comments)   Passed out - at the dentist's office      Medication List    TAKE these medications   acetaminophen 325 MG tablet Commonly known as:  TYLENOL Take 2 tablets (650 mg total) by mouth every 4 (four)  hours as needed for headache or mild pain.   allopurinol 100 MG tablet Commonly known as:  ZYLOPRIM Take 1 tablet (100 mg total) by mouth daily.   aspirin EC 81 MG tablet Take 81 mg by mouth daily.   atorvastatin 40 MG tablet Commonly known as:  LIPITOR Take 1 tablet (40 mg total) by mouth every evening.   CALCIUM ALGINATE EX Apply 1 application topically every other day. Outer ankle   digoxin 0.125 MG tablet Commonly known as:  LANOXIN Take 1 tablet (0.125 mg total) by mouth daily.   metoprolol succinate 25 MG 24 hr tablet Commonly known as:  TOPROL-XL Take 1 tablet (25 mg total) by mouth daily.   MULTI-VITAMIN DAILY Tabs Take 1 tablet by mouth daily.   potassium chloride SA 20 MEQ tablet Commonly known as:  KLOR-CON M20 Take 1 tablet (20 mEq total) by mouth daily.   spironolactone 25 MG tablet Commonly known as:  ALDACTONE Take 0.5 tablets (12.5 mg total) by mouth daily.   torsemide 20 MG tablet Commonly known as:  DEMADEX Take 3 tablets (60 mg total) by mouth daily.   warfarin 6 MG tablet Commonly known as:  COUMADIN Take 6 mg by mouth See admin instructions. Takes 6 mg (1 tablet)  daily except for Saturday take 3mg  (1/2 tablet)       Disposition:  Discharge Instructions    Diet - low sodium heart healthy    Complete by:  As directed    Increase activity slowly    Complete by:  As directed      Follow-up Information    [facility] Follow up on 10/26/2016.   Specialty:  Cardiology Why:  at [facility] for wound check  Contact information: [address] [place]       [place], Peter M, MD Follow up on 12/05/2016.   Specialty:  Cardiology Why:  at 8:40AM Contact information: [address] [place] [address] [phone]        Constance Haw, MD Follow up on 01/16/2017.   Specialty:  Cardiology Why:  at 8:30AM Contact information: [place] [address] [phone]           Duration of Discharge Encounter: Greater than 30 minutes including physician time.  Signed, Chanetta Marshall, NP 10/13/2016 9:34 AM    I have seen and examined this patient with Chanetta Marshall.  Agree with above, note added to reflect my findings.  On exam, iRRR, no murmurs, lungs clear. Had ICD implant for ischemic cardiac myopathy yesterday. Device interrogation and chest x-ray without major grand mal A. Plan for discharge today with follow-up in device clinic in 10 days.    Awa Bachicha M. Paz Fuentes MD 10/13/2016 12:16 PM

## 2016-10-12 NOTE — Discharge Instructions (Signed)
° ° °  Supplemental Discharge Instructions for  Pacemaker/Defibrillator Patients  Activity No heavy lifting or vigorous activity with your left/right arm for 6 to 8 weeks.  Do not raise your left/right arm above your head for one week.  Gradually raise your affected arm as drawn below.           __         10/16/16                  10/17/16                      10/18/16                   10/19/16  NO DRIVING until 05/2246  WOUND CARE - Keep the wound area clean and dry.  Do not get this area wet for one week. No showers for one week; you may shower on  10/19/16   . - The tape/steri-strips on your wound will fall off; do not pull them off.  No bandage is needed on the site.  DO  NOT apply any creams, oils, or ointments to the wound area. - If you notice any drainage or discharge from the wound, any swelling or bruising at the site, or you develop a fever > 101? F after you are discharged home, call the office at once.  Special Instructions - You are still able to use cellular telephones; use the ear opposite the side where you have your pacemaker/defibrillator.  Avoid carrying your cellular phone near your device. - When traveling through airports, show security personnel your identification card to avoid being screened in the metal detectors.  Ask the security personnel to use the hand wand. - Avoid arc welding equipment, MRI testing (magnetic resonance imaging), TENS units (transcutaneous nerve stimulators).  Call the office for questions about other devices. - Avoid electrical appliances that are in poor condition or are not properly grounded. - Microwave ovens are safe to be near or to operate.  Additional information for defibrillator patients should your device go off: - If your device goes off ONCE and you feel fine afterward, notify the device clinic nurses. - If your device goes off ONCE and you do not feel well afterward, call 911. - If your device goes off TWICE, call 911. - If your  device goes off THREE times in one day, call 911.  DO NOT DRIVE YOURSELF OR A FAMILY MEMBER WITH A DEFIBRILLATOR TO THE HOSPITAL--CALL 911.

## 2016-10-12 NOTE — Care Management Note (Signed)
Case Management Note  Patient Details  Name: Jason Choi MRN: [id number] Date of Birth: 09-11-1940  Subjective/Objective:   From home, s/p ICD implant.                 Action/Plan: NCM will follow for dc needs.  Expected Discharge Date:                  Expected Discharge Plan:  Home/Self Care  In-House Referral:     Discharge planning Services  CM Consult  Post Acute Care Choice:    Choice offered to:     DME Arranged:    DME Agency:     HH Arranged:    HH Agency:     Status of Service:  In process, will continue to follow  If discussed at Long Length of Stay Meetings, dates discussed:    Additional Comments:  Zenon Mayo, RN 10/12/2016, 2:31 PM

## 2016-10-12 NOTE — H&P (Signed)
PAOLA FLYNT is a 76 y.o. male with chronic systolic heart failure. He had a possible epidode of syncope and has been wearing a life vest. He presents today for implant of an ICD. On exam, iRRR, no murmurs, lungs clear. Risks and benefits of ICD implant were discussed. Risks include but not limited to bleeding, infection, tamponade, pneumothorax. The patient understands the risks and has agreed to the procedure.  Gamble Enderle Curt Bears, MD 10/12/2016 11:00 AM  ICD Criteria  Current LVEF:15%. Within 12 months prior to implant: Yes   Heart failure history: Yes, Class II  Cardiomyopathy history: Yes, Ischemic Cardiomyopathy - Prior MI.  Atrial Fibrillation/Atrial Flutter: Yes, Permanent.  Ventricular tachycardia history: No.  Cardiac arrest history: No.  History of syndromes with risk of sudden death: No.  Previous ICD: No.  Current ICD indication: Primary  PPM indication: No.   Class I or II Bradycardia indication present: No  Beta Blocker therapy for 3 or more months: Yes, prescribed.   Ace Inhibitor/ARB therapy for 3 or more months: No, medical reason.

## 2016-10-13 ENCOUNTER — Encounter (HOSPITAL_COMMUNITY): Payer: Medicare Other

## 2016-10-13 ENCOUNTER — Ambulatory Visit (HOSPITAL_COMMUNITY): Payer: Medicare Other

## 2016-10-13 DIAGNOSIS — I130 Hypertensive heart and chronic kidney disease with heart failure and stage 1 through stage 4 chronic kidney disease, or unspecified chronic kidney disease: Secondary | ICD-10-CM | POA: Diagnosis not present

## 2016-10-13 DIAGNOSIS — Z006 Encounter for examination for normal comparison and control in clinical research program: Secondary | ICD-10-CM | POA: Diagnosis not present

## 2016-10-13 DIAGNOSIS — I5022 Chronic systolic (congestive) heart failure: Secondary | ICD-10-CM | POA: Diagnosis not present

## 2016-10-13 DIAGNOSIS — N189 Chronic kidney disease, unspecified: Secondary | ICD-10-CM | POA: Diagnosis not present

## 2016-10-13 DIAGNOSIS — I255 Ischemic cardiomyopathy: Secondary | ICD-10-CM | POA: Diagnosis not present

## 2016-10-13 LAB — PROTIME-INR
INR: 2.26
Prothrombin Time: 25.3 seconds — ABNORMAL HIGH (ref 11.4–15.2)

## 2016-10-16 ENCOUNTER — Telehealth: Payer: Self-pay | Admitting: Cardiology

## 2016-10-16 DIAGNOSIS — I87311 Chronic venous hypertension (idiopathic) with ulcer of right lower extremity: Secondary | ICD-10-CM | POA: Diagnosis not present

## 2016-10-16 LAB — POCT INR: INR: 2

## 2016-10-16 NOTE — Telephone Encounter (Signed)
Spoke with Tiffany RN with [facility]  INR 2.0 PT 23.5  Will route to [employer]

## 2016-10-16 NOTE — Telephone Encounter (Signed)
Returned call to [place] with [facility].She stated patient recently in hospital for a ICD.She needs verbal orders to continue his home care.Advised ok to resume care.

## 2016-10-16 NOTE — Telephone Encounter (Signed)
Returned the call to the nurse. Mr. Jason Choi has recently been discharged from the hospital with a new ICD. She needs a return of care note so she can visit him for follow up.

## 2016-10-16 NOTE — Telephone Encounter (Signed)
New message    Tiffany from [facility] is calling for a return of care order for pt.

## 2016-10-17 ENCOUNTER — Ambulatory Visit (INDEPENDENT_AMBULATORY_CARE_PROVIDER_SITE_OTHER): Payer: Medicare Other | Admitting: Pharmacist Clinician (PhC)/ Clinical Pharmacy Specialist

## 2016-10-17 DIAGNOSIS — Z5181 Encounter for therapeutic drug level monitoring: Secondary | ICD-10-CM

## 2016-10-17 DIAGNOSIS — I482 Chronic atrial fibrillation: Secondary | ICD-10-CM

## 2016-10-17 DIAGNOSIS — I255 Ischemic cardiomyopathy: Secondary | ICD-10-CM

## 2016-10-17 DIAGNOSIS — I4821 Permanent atrial fibrillation: Secondary | ICD-10-CM

## 2016-10-17 NOTE — Telephone Encounter (Signed)
See anticoag note

## 2016-10-24 ENCOUNTER — Ambulatory Visit (INDEPENDENT_AMBULATORY_CARE_PROVIDER_SITE_OTHER): Payer: Medicare Other | Admitting: Pharmacist Clinician (PhC)/ Clinical Pharmacy Specialist

## 2016-10-24 DIAGNOSIS — I482 Chronic atrial fibrillation: Secondary | ICD-10-CM

## 2016-10-24 DIAGNOSIS — I4821 Permanent atrial fibrillation: Secondary | ICD-10-CM

## 2016-10-24 DIAGNOSIS — Z5181 Encounter for therapeutic drug level monitoring: Secondary | ICD-10-CM

## 2016-10-24 LAB — POCT INR: INR: 2.3

## 2016-10-26 ENCOUNTER — Ambulatory Visit (INDEPENDENT_AMBULATORY_CARE_PROVIDER_SITE_OTHER): Payer: Medicare Other | Admitting: *Deleted

## 2016-10-26 DIAGNOSIS — Z95810 Presence of automatic (implantable) cardiac defibrillator: Secondary | ICD-10-CM

## 2016-10-26 DIAGNOSIS — I255 Ischemic cardiomyopathy: Secondary | ICD-10-CM | POA: Diagnosis not present

## 2016-10-26 LAB — CUP PACEART INCLINIC DEVICE CHECK
HIGH POWER IMPEDANCE MEASURED VALUE: 64 Ohm
Implantable Lead Implant Date: 20180517
Implantable Pulse Generator Implant Date: 20180517
Lead Channel Impedance Value: 342 Ohm
Lead Channel Pacing Threshold Amplitude: 0.75 V
Lead Channel Pacing Threshold Pulse Width: 0.4 ms
Lead Channel Sensing Intrinsic Amplitude: 8 mV
MDC IDC LEAD LOCATION: 753860
MDC IDC MSMT BATTERY REMAINING LONGEVITY: 136 mo
MDC IDC MSMT BATTERY VOLTAGE: 3.1 V
MDC IDC MSMT LEADCHNL RV IMPEDANCE VALUE: 456 Ohm
MDC IDC SESS DTM: 20180531093613
MDC IDC SET LEADCHNL RV PACING AMPLITUDE: 3.5 V
MDC IDC SET LEADCHNL RV PACING PULSEWIDTH: 0.4 ms
MDC IDC SET LEADCHNL RV SENSING SENSITIVITY: 0.3 mV
MDC IDC STAT BRADY RV PERCENT PACED: 9.4 %

## 2016-10-26 NOTE — Progress Notes (Signed)
Wound check appointment. Steri-strips removed. Wound without redness or edema. Incision edges approximated, wound well healed. Normal device function. Thresholds, sensing, and impedances consistent with implant measurements. Device programmed at 3.5V for extra safety margin until 3 month visit. Histogram distribution appropriate for patient and level of activity. No ventricular arrhythmias noted. Chronic AF, +warfarin. Patient educated about wound care, arm mobility, lifting restrictions, shock plan. ROV in 3 months with WC on 01/16/17.

## 2016-10-30 ENCOUNTER — Encounter (HOSPITAL_BASED_OUTPATIENT_CLINIC_OR_DEPARTMENT_OTHER): Payer: Medicare Other | Attending: Internal Medicine

## 2016-10-30 DIAGNOSIS — L97312 Non-pressure chronic ulcer of right ankle with fat layer exposed: Secondary | ICD-10-CM | POA: Insufficient documentation

## 2016-10-30 DIAGNOSIS — Z8612 Personal history of poliomyelitis: Secondary | ICD-10-CM | POA: Insufficient documentation

## 2016-10-30 DIAGNOSIS — I110 Hypertensive heart disease with heart failure: Secondary | ICD-10-CM | POA: Insufficient documentation

## 2016-10-30 DIAGNOSIS — I739 Peripheral vascular disease, unspecified: Secondary | ICD-10-CM | POA: Insufficient documentation

## 2016-10-30 DIAGNOSIS — I509 Heart failure, unspecified: Secondary | ICD-10-CM | POA: Diagnosis not present

## 2016-10-30 DIAGNOSIS — I2510 Atherosclerotic heart disease of native coronary artery without angina pectoris: Secondary | ICD-10-CM | POA: Insufficient documentation

## 2016-10-30 DIAGNOSIS — I87311 Chronic venous hypertension (idiopathic) with ulcer of right lower extremity: Secondary | ICD-10-CM | POA: Diagnosis not present

## 2016-10-30 DIAGNOSIS — I252 Old myocardial infarction: Secondary | ICD-10-CM | POA: Diagnosis not present

## 2016-11-02 ENCOUNTER — Telehealth: Payer: Self-pay | Admitting: Cardiology

## 2016-11-02 NOTE — Telephone Encounter (Signed)
As per previous telephone note. Okay to resume.

## 2016-11-02 NOTE — Telephone Encounter (Signed)
New message     They need nursing orders 2x a week for 8 week for medication and medical management

## 2016-11-06 ENCOUNTER — Other Ambulatory Visit (HOSPITAL_COMMUNITY)
Admission: RE | Admit: 2016-11-06 | Discharge: 2016-11-06 | Disposition: A | Discharge status: Home / Self Care | Payer: Medicare Other | Source: Other Acute Inpatient Hospital | Attending: Internal Medicine | Admitting: Internal Medicine

## 2016-11-06 DIAGNOSIS — I87311 Chronic venous hypertension (idiopathic) with ulcer of right lower extremity: Secondary | ICD-10-CM | POA: Diagnosis present

## 2016-11-06 DIAGNOSIS — L97513 Non-pressure chronic ulcer of other part of right foot with necrosis of muscle: Secondary | ICD-10-CM | POA: Insufficient documentation

## 2016-11-08 ENCOUNTER — Ambulatory Visit (INDEPENDENT_AMBULATORY_CARE_PROVIDER_SITE_OTHER): Payer: Medicare Other | Admitting: Pharmacist Clinician (PhC)/ Clinical Pharmacy Specialist

## 2016-11-08 DIAGNOSIS — I4821 Permanent atrial fibrillation: Secondary | ICD-10-CM

## 2016-11-08 DIAGNOSIS — Z5181 Encounter for therapeutic drug level monitoring: Secondary | ICD-10-CM

## 2016-11-08 DIAGNOSIS — I482 Chronic atrial fibrillation: Secondary | ICD-10-CM

## 2016-11-08 LAB — POCT INR: INR: 2

## 2016-11-09 LAB — AEROBIC CULTURE  (SUPERFICIAL SPECIMEN)

## 2016-11-09 LAB — AEROBIC CULTURE W GRAM STAIN (SUPERFICIAL SPECIMEN): Gram Stain: NONE SEEN

## 2016-11-13 DIAGNOSIS — I87311 Chronic venous hypertension (idiopathic) with ulcer of right lower extremity: Secondary | ICD-10-CM | POA: Diagnosis not present

## 2016-11-14 ENCOUNTER — Ambulatory Visit
Admission: RE | Admit: 2016-11-14 | Discharge: 2016-11-14 | Disposition: A | Discharge status: Home / Self Care | Payer: Medicare Other | Source: Ambulatory Visit | Attending: Internal Medicine | Admitting: Internal Medicine

## 2016-11-14 ENCOUNTER — Other Ambulatory Visit: Payer: Self-pay | Admitting: Internal Medicine

## 2016-11-14 DIAGNOSIS — L97319 Non-pressure chronic ulcer of right ankle with unspecified severity: Secondary | ICD-10-CM

## 2016-11-15 ENCOUNTER — Ambulatory Visit (INDEPENDENT_AMBULATORY_CARE_PROVIDER_SITE_OTHER): Payer: Medicare Other | Admitting: Pharmacist

## 2016-11-15 DIAGNOSIS — I482 Chronic atrial fibrillation: Secondary | ICD-10-CM

## 2016-11-15 DIAGNOSIS — I4821 Permanent atrial fibrillation: Secondary | ICD-10-CM

## 2016-11-15 DIAGNOSIS — Z5181 Encounter for therapeutic drug level monitoring: Secondary | ICD-10-CM

## 2016-11-15 LAB — PROTIME-INR: INR: 2 — AB (ref <=1.1)

## 2016-11-17 ENCOUNTER — Encounter: Payer: Self-pay | Admitting: Cardiology

## 2016-11-20 DIAGNOSIS — I87311 Chronic venous hypertension (idiopathic) with ulcer of right lower extremity: Secondary | ICD-10-CM | POA: Diagnosis not present

## 2016-11-22 ENCOUNTER — Ambulatory Visit (INDEPENDENT_AMBULATORY_CARE_PROVIDER_SITE_OTHER): Payer: Medicare Other | Admitting: Pharmacist Clinician (PhC)/ Clinical Pharmacy Specialist

## 2016-11-22 DIAGNOSIS — I482 Chronic atrial fibrillation: Secondary | ICD-10-CM

## 2016-11-22 DIAGNOSIS — Z5181 Encounter for therapeutic drug level monitoring: Secondary | ICD-10-CM

## 2016-11-22 DIAGNOSIS — I4821 Permanent atrial fibrillation: Secondary | ICD-10-CM

## 2016-11-22 LAB — POCT INR: INR: 2.6

## 2016-11-27 ENCOUNTER — Encounter (HOSPITAL_BASED_OUTPATIENT_CLINIC_OR_DEPARTMENT_OTHER): Payer: Medicare Other | Attending: Internal Medicine

## 2016-11-27 ENCOUNTER — Telehealth: Payer: Self-pay | Admitting: Cardiology

## 2016-11-27 DIAGNOSIS — I428 Other cardiomyopathies: Secondary | ICD-10-CM | POA: Diagnosis not present

## 2016-11-27 DIAGNOSIS — I739 Peripheral vascular disease, unspecified: Secondary | ICD-10-CM | POA: Insufficient documentation

## 2016-11-27 DIAGNOSIS — S51012A Laceration without foreign body of left elbow, initial encounter: Secondary | ICD-10-CM | POA: Insufficient documentation

## 2016-11-27 DIAGNOSIS — I87311 Chronic venous hypertension (idiopathic) with ulcer of right lower extremity: Secondary | ICD-10-CM | POA: Insufficient documentation

## 2016-11-27 DIAGNOSIS — I2510 Atherosclerotic heart disease of native coronary artery without angina pectoris: Secondary | ICD-10-CM | POA: Insufficient documentation

## 2016-11-27 DIAGNOSIS — I110 Hypertensive heart disease with heart failure: Secondary | ICD-10-CM | POA: Insufficient documentation

## 2016-11-27 DIAGNOSIS — I252 Old myocardial infarction: Secondary | ICD-10-CM | POA: Diagnosis not present

## 2016-11-27 DIAGNOSIS — Z95810 Presence of automatic (implantable) cardiac defibrillator: Secondary | ICD-10-CM | POA: Diagnosis not present

## 2016-11-27 DIAGNOSIS — L97312 Non-pressure chronic ulcer of right ankle with fat layer exposed: Secondary | ICD-10-CM | POA: Diagnosis not present

## 2016-11-27 DIAGNOSIS — W19XXXA Unspecified fall, initial encounter: Secondary | ICD-10-CM | POA: Insufficient documentation

## 2016-11-27 DIAGNOSIS — S51812A Laceration without foreign body of left forearm, initial encounter: Secondary | ICD-10-CM | POA: Diagnosis not present

## 2016-11-27 DIAGNOSIS — T68XXXA Hypothermia, initial encounter: Secondary | ICD-10-CM | POA: Diagnosis not present

## 2016-11-27 DIAGNOSIS — I509 Heart failure, unspecified: Secondary | ICD-10-CM | POA: Insufficient documentation

## 2016-11-27 DIAGNOSIS — X31XXXA Exposure to excessive natural cold, initial encounter: Secondary | ICD-10-CM | POA: Insufficient documentation

## 2016-11-27 DIAGNOSIS — Z8612 Personal history of poliomyelitis: Secondary | ICD-10-CM | POA: Insufficient documentation

## 2016-11-27 NOTE — Telephone Encounter (Signed)
Received a call from [place] at wound care center requesting patient be seen for fast heart rate 120, crackles in lungs.Dr.Jordan not in office today.No PA appointments.Spoke to DOD Dr.Harding he advised send patient to ED.

## 2016-11-28 ENCOUNTER — Ambulatory Visit (INDEPENDENT_AMBULATORY_CARE_PROVIDER_SITE_OTHER): Payer: Medicare Other | Admitting: Pharmacist

## 2016-11-28 DIAGNOSIS — I4821 Permanent atrial fibrillation: Secondary | ICD-10-CM

## 2016-11-28 DIAGNOSIS — Z5181 Encounter for therapeutic drug level monitoring: Secondary | ICD-10-CM

## 2016-11-28 DIAGNOSIS — I482 Chronic atrial fibrillation: Secondary | ICD-10-CM

## 2016-11-28 LAB — PROTIME-INR: INR: 3.3 — AB (ref <=1.1)

## 2016-11-30 ENCOUNTER — Telehealth: Payer: Self-pay

## 2016-11-30 ENCOUNTER — Encounter (HOSPITAL_COMMUNITY): Payer: Self-pay

## 2016-11-30 ENCOUNTER — Emergency Department (HOSPITAL_COMMUNITY): Payer: Medicare Other

## 2016-11-30 ENCOUNTER — Emergency Department (HOSPITAL_COMMUNITY)
Admission: EM | Admit: 2016-11-30 | Discharge: 2016-11-30 | Disposition: A | Discharge status: Home / Self Care | Payer: Medicare Other | Attending: Emergency Medicine | Admitting: Emergency Medicine

## 2016-11-30 DIAGNOSIS — I5023 Acute on chronic systolic (congestive) heart failure: Secondary | ICD-10-CM

## 2016-11-30 DIAGNOSIS — Z87891 Personal history of nicotine dependence: Secondary | ICD-10-CM | POA: Diagnosis not present

## 2016-11-30 DIAGNOSIS — E7800 Pure hypercholesterolemia, unspecified: Secondary | ICD-10-CM | POA: Diagnosis not present

## 2016-11-30 DIAGNOSIS — Z7982 Long term (current) use of aspirin: Secondary | ICD-10-CM | POA: Diagnosis not present

## 2016-11-30 DIAGNOSIS — I129 Hypertensive chronic kidney disease with stage 1 through stage 4 chronic kidney disease, or unspecified chronic kidney disease: Secondary | ICD-10-CM | POA: Insufficient documentation

## 2016-11-30 DIAGNOSIS — Z79899 Other long term (current) drug therapy: Secondary | ICD-10-CM | POA: Diagnosis not present

## 2016-11-30 DIAGNOSIS — N182 Chronic kidney disease, stage 2 (mild): Secondary | ICD-10-CM | POA: Insufficient documentation

## 2016-11-30 DIAGNOSIS — Z95810 Presence of automatic (implantable) cardiac defibrillator: Secondary | ICD-10-CM | POA: Insufficient documentation

## 2016-11-30 DIAGNOSIS — I5022 Chronic systolic (congestive) heart failure: Secondary | ICD-10-CM | POA: Diagnosis not present

## 2016-11-30 DIAGNOSIS — I252 Old myocardial infarction: Secondary | ICD-10-CM | POA: Insufficient documentation

## 2016-11-30 DIAGNOSIS — R079 Chest pain, unspecified: Secondary | ICD-10-CM | POA: Diagnosis present

## 2016-11-30 DIAGNOSIS — I110 Hypertensive heart disease with heart failure: Secondary | ICD-10-CM | POA: Diagnosis not present

## 2016-11-30 DIAGNOSIS — I4891 Unspecified atrial fibrillation: Secondary | ICD-10-CM | POA: Insufficient documentation

## 2016-11-30 LAB — CBC
HEMATOCRIT: 32.3 % — AB (ref 39.0–52.0)
HEMOGLOBIN: 9.8 g/dL — AB (ref 13.0–17.0)
MCH: 28.8 pg (ref 26.0–34.0)
MCHC: 30.3 g/dL (ref 30.0–36.0)
MCV: 95 fL (ref 78.0–100.0)
Platelets: 188 10*3/uL (ref 150–400)
RBC: 3.4 MIL/uL — ABNORMAL LOW (ref 4.22–5.81)
RDW: 18.5 % — AB (ref 11.5–15.5)
WBC: 7.5 10*3/uL (ref 4.0–10.5)

## 2016-11-30 LAB — I-STAT TROPONIN, ED: TROPONIN I, POC: 0.06 ng/mL (ref 0.00–0.08)

## 2016-11-30 LAB — BASIC METABOLIC PANEL
ANION GAP: 11 (ref 5–15)
BUN: 81 mg/dL — AB (ref 6–20)
CALCIUM: 9.4 mg/dL (ref 8.9–10.3)
CO2: 23 mmol/L (ref 22–32)
Chloride: 107 mmol/L (ref 101–111)
Creatinine, Ser: 3.13 mg/dL — ABNORMAL HIGH (ref 0.61–1.24)
GFR calc Af Amer: 21 mL/min — ABNORMAL LOW (ref >60)
GFR, EST NON AFRICAN AMERICAN: 18 mL/min — AB (ref >60)
GLUCOSE: 95 mg/dL (ref 65–99)
Potassium: 5.1 mmol/L (ref 3.5–5.1)
Sodium: 141 mmol/L (ref 135–145)

## 2016-11-30 LAB — PROTIME-INR
INR: 3.19
Prothrombin Time: 33.4 seconds — ABNORMAL HIGH (ref 11.4–15.2)

## 2016-11-30 MED ORDER — FUROSEMIDE 20 MG PO TABS
80.0000 mg | ORAL_TABLET | Freq: Once | ORAL | Status: AC
  Administered 2016-11-30: 80 mg via ORAL
  Filled 2016-11-30: qty 4

## 2016-11-30 MED ORDER — FUROSEMIDE 10 MG/ML IJ SOLN: 40.0000 mg | Freq: Once | INTRAMUSCULAR | Status: DC

## 2016-11-30 NOTE — Telephone Encounter (Signed)
Spoke to patient 11/28/16 he stated he did not go to ED.Stated he has appointment with PCP at 11:30 am.Dr.Jordan advised he needs to see heart failure clinic.Left message with triage at heart failure clinic Dr.Jordan advised patient needs to be seen having problems breathing hard.

## 2016-11-30 NOTE — ED Notes (Signed)
ED Provider at bedside. 

## 2016-11-30 NOTE — Discharge Instructions (Addendum)
Please make sure to keep your appointment with your cardiologist Dr. Martinique on Tuesday.  If you feel your shortness of breath worsening, please come back into the ED.  Call your cardiologist tomorrow and make him aware of your visit here to the ED.

## 2016-11-30 NOTE — ED Notes (Signed)
Patient transported to X-ray 

## 2016-11-30 NOTE — ED Provider Notes (Signed)
[facility] DEPT Provider Note   CSN: [id number] Arrival date & time: 11/30/16  1324     History   Chief Complaint No chief complaint on file.   HPI Jason Choi is a 76 y.o. male.  76 yo male presenting today due to being called by pcp and told to come to ED for elevated BNP to 3,300, it appears from past records his baseline is somewhere around 1,000.  He denies SOB, chest pain.  He does have shortness of breath with activity but is a chronic heart failure patient EF 35% in 2012 s/p ICD placement in May of this year, and doesn't feel this shortness of breath is increased or new.        Past Medical History:  Diagnosis Date  . AICD (automatic cardioverter/defibrillator) present   . Anemia    Referral to GI Feb 2013  . Arthritis    "hands primarily" (10/12/2016)  . Atrial fibrillation ([place])    ON AMIODARONE: PER VISIT NOTE IN [facility]  . CHF (congestive heart failure) ([place])   . Chronic anticoagulation    on coumadin  . Chronic kidney disease (CKD), stage II (mild)   . Gout   . High risk medication use    on amiodarone  . HTN (hypertension)   . Hypercholesteremia   . LV dysfunction    EF 35 to 40% per echo May 2012; EF remains 35 to 40% per echo Feb 2013. Referred for ICD  . Myocardial infarction ([place])   . Osteoarthritis of right shoulder region    ACUTE PAIN  . 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 Pulmonary hypertension, moderate to severe [facility])     Patient Active Problem List   Diagnosis Date Noted  . CHF (congestive heart failure) ([place]) 10/12/2016  . Gout 08/16/2016  . Syncope 07/26/2016  . Encounter for therapeutic drug monitoring 07/08/2013  . Hyponatremia 01/03/2012  . CKD (chronic kidney disease) stage 3, GFR 30-59 ml/min   . Anemia   . CAD (coronary artery disease)   . HTN (hypertension)   . Hypercholesteremia   . Chronic anticoagulation   . Chronic systolic CHF (congestive heart failure) ([place])   . Permanent atrial fibrillation  ([place]) 08/22/2010    Past Surgical History:  Procedure Laterality Date  . CARDIAC CATHETERIZATION  2009   Grafts patent  . CARDIAC CATHETERIZATION  2012   Grafts patent. EF was 25 to 30% but 35 to 40 by echo  . CARDIAC CATHETERIZATION  2003  . CARDIAC DEFIBRILLATOR PLACEMENT  10/12/2016  . CARDIOVERSION  2008--  FOR PAF   SUCCESSFUL  . CARDIOVERSION N/A 06/13/2013   Procedure: CARDIOVERSION;  Surgeon: Peter M Martinique, MD;  Location: [facility] ENDOSCOPY;  Service: Cardiovascular;  Laterality: N/A;  . CATARACT EXTRACTION, BILATERAL Bilateral   . CORONARY ARTERY BYPASS GRAFT  2002   SVG to PDA, Free radial to Intermediate, LIMA to LAD by Dr Prescott Gum  . ICD IMPLANT N/A 10/12/2016   Procedure: ICD Implant;  Surgeon: Constance Haw, MD;  Location: [facility] CV LAB;  Service: Cardiovascular;  Laterality: N/A;  . KNEE ARTHROSCOPY Left 2000  . RIGHT HEART CATH N/A 08/08/2016   Procedure: Right Heart Cath;  Surgeon: Larey Dresser, MD;  Location: [facility] CV LAB;  Service: Cardiovascular;  Laterality: N/A;  . Right Leg surgeries Right X 5-6   due to polio; "relocated muscles; pins in ankle"  . SHOULDER ARTHROSCOPY W/ ROTATOR CUFF REPAIR  Right 05/16/2011       Home Medications    Prior to Admission medications   Medication Sig Start Date End Date Taking? Authorizing Provider  acetaminophen (TYLENOL) 325 MG tablet Take 2 tablets (650 mg total) by mouth every 4 (four) hours as needed for headache or mild pain. 08/10/16   Shirley Friar, PA-C  allopurinol (ZYLOPRIM) 100 MG tablet Take 1 tablet (100 mg total) by mouth daily. 09/21/16   Larey Dresser, MD  aspirin EC 81 MG tablet Take 81 mg by mouth daily.    [provider]  atorvastatin (LIPITOR) 40 MG tablet Take 1 tablet (40 mg total) by mouth every evening. 09/21/16   Larey Dresser, MD  CALCIUM ALGINATE EX Apply 1 application topically every other day. Outer ankle    [provider]  digoxin (LANOXIN) 0.125  MG tablet Take 1 tablet (0.125 mg total) by mouth daily. 09/21/16   Larey Dresser, MD  metoprolol succinate (TOPROL-XL) 25 MG 24 hr tablet Take 1 tablet (25 mg total) by mouth daily. 09/21/16   Larey Dresser, MD  Multiple Vitamin (MULTI-VITAMIN DAILY) TABS Take 1 tablet by mouth daily.    [provider]  potassium chloride SA (KLOR-CON M20) 20 MEQ tablet Take 1 tablet (20 mEq total) by mouth daily. 09/21/16   Larey Dresser, MD  spironolactone (ALDACTONE) 25 MG tablet Take 0.5 tablets (12.5 mg total) by mouth daily. 09/21/16   Larey Dresser, MD  torsemide (DEMADEX) 20 MG tablet Take 3 tablets (60 mg total) by mouth daily. 09/21/16   Larey Dresser, MD  warfarin (COUMADIN) 6 MG tablet Take 6 mg by mouth See admin instructions. Takes 6 mg (1 tablet)  daily except for Saturday take 3mg  (1/2 tablet)    [provider]    Family History Family History  Problem Relation Age of Onset  . Valvular heart disease Father   . Colon cancer Neg Hx   . Colon polyps Neg Hx   . Rectal cancer Neg Hx   . Stomach cancer Neg Hx     Social History Social History  Substance Use Topics  . Smoking status: Former Smoker    Packs/day: 2.00    Years: 20.00    Types: Cigarettes  . Smokeless tobacco: Never Used     Comment: 10/12/2016 "haven't had a cigarette since 1980"  . Alcohol use 7.2 oz/week    4 Shots of liquor, 8 Cans of beer per week     Comment: 10/12/2016 "couple beers and a shot of [place] most  3-4 times/week"     Allergies   Lidocaine and Procaine hcl   Review of Systems Review of Systems  Constitutional: Negative for chills and fever.  HENT: Negative for ear pain and sore throat.   Eyes: Negative for pain and visual disturbance.  Respiratory: Negative for cough and shortness of breath.   Cardiovascular: Negative for chest pain and palpitations.  Gastrointestinal: Negative for abdominal pain and vomiting.  Genitourinary: Negative for dysuria and hematuria.    Musculoskeletal: Negative for arthralgias and back pain.  Skin: Negative for color change and rash.  Neurological: Negative for seizures and syncope.  All other systems reviewed and are negative.    Physical Exam Updated Vital Signs BP 106/74   Pulse 63   Temp 97.8 F (36.6 C) (Oral)   Resp 19   Ht 6\' 3"  (1.905 m)   Wt 81.6 kg (180 lb)   SpO2 94%  BMI 22.50 kg/m   Physical Exam  Constitutional: He appears well-developed and well-nourished.  HENT:  Head: Normocephalic and atraumatic.  Eyes: Conjunctivae are normal.  Neck: Neck supple. JVD present.  Cardiovascular: Normal rate and regular rhythm.   No murmur heard. Increased JVD to angle of mandible, no lower extremity edema  Pulmonary/Chest: Effort normal. No respiratory distress. He has rales (RLL rales).  Abdominal: Soft. There is no tenderness.  Musculoskeletal: He exhibits no edema.  Neurological: He is alert. No cranial nerve deficit. Coordination normal.  Skin: Skin is warm and dry.  Psychiatric: He has a normal mood and affect.  Nursing note and vitals reviewed.    ED Treatments / Results  Labs (all labs ordered are listed, but only abnormal results are displayed) Labs Reviewed  BASIC METABOLIC PANEL - Abnormal; Notable for the following:       Result Value   BUN 81 (*)    Creatinine, Ser 3.13 (*)    GFR calc non Af Amer 18 (*)    GFR calc Af Amer 21 (*)    All other components within normal limits  CBC - Abnormal; Notable for the following:    RBC 3.40 (*)    Hemoglobin 9.8 (*)    HCT 32.3 (*)    RDW 18.5 (*)    All other components within normal limits  PROTIME-INR - Abnormal; Notable for the following:    Prothrombin Time 33.4 (*)    All other components within normal limits  I-STAT TROPOININ, ED    EKG  EKG Interpretation  Date/Time:  Thursday November 30 2016 10:08:59 EDT Ventricular Rate:  54 PR Interval:    QRS Duration: 128 QT Interval:  425 QTC Calculation: 403 R Axis:   -50 Text  Interpretation:  Atrial fibrillation Nonspecific IVCD with LAD Nonspecific T abnrm, anterolateral leads No significant change since last tracing Confirmed by Deno Etienne [phone]) on 11/30/2016 10:33:39 AM       Radiology No results found.  Procedures Procedures (including critical care time)  Medications Ordered in ED Medications - No data to display   Initial Impression / Assessment and Plan / ED Course  I have reviewed the triage vital signs and the nursing notes.  Pertinent labs & imaging results that were available during my care of the patient were reviewed by me and considered in my medical decision making (see chart for details).   CHF exacerbation: Elevated BNP to 3,383.  Baseline looks to be around 1,000  -PCP advised pt to come in, patient has no complaints and wants to go home -Patient desat to 70% when ambulating with nurse -Patient encouraged to stay in hospital but refused and left AMA -Gave one dose of lasix 40mg  before leaving as patient is fluid overloaded    Final Clinical Impressions(s) / ED Diagnoses   Final diagnoses:  None    New Prescriptions New Prescriptions   No medications on file     Katherine Roan, MD 11/30/16 [place], [place], DO 11/30/16 [place], [place], DO 11/30/16 1332

## 2016-11-30 NOTE — ED Notes (Signed)
Walked patient to the bathroom oxy level went down to 70 room air and 66 heart rate patient was sob walking so, coming back I wheeled the patient back oxy level came back up to 90 room air then put back in the bed placed on oxygen 2L now oxygen level is at 100 on 2L of oxygen patient is now resting

## 2016-12-04 ENCOUNTER — Encounter (HOSPITAL_COMMUNITY): Payer: Self-pay | Admitting: Neurology

## 2016-12-04 ENCOUNTER — Inpatient Hospital Stay (HOSPITAL_COMMUNITY)
Admission: EM | Admit: 2016-12-04 | Discharge: 2016-12-06 | DRG: 291 | Disposition: A | Discharge status: Home Health | Payer: Medicare Other | Attending: Internal Medicine | Admitting: Internal Medicine

## 2016-12-04 ENCOUNTER — Telehealth (HOSPITAL_COMMUNITY): Payer: Self-pay | Admitting: Vascular Surgery

## 2016-12-04 ENCOUNTER — Emergency Department (HOSPITAL_COMMUNITY): Payer: Medicare Other

## 2016-12-04 DIAGNOSIS — R64 Cachexia: Secondary | ICD-10-CM | POA: Diagnosis present

## 2016-12-04 DIAGNOSIS — I252 Old myocardial infarction: Secondary | ICD-10-CM | POA: Diagnosis not present

## 2016-12-04 DIAGNOSIS — F329 Major depressive disorder, single episode, unspecified: Secondary | ICD-10-CM | POA: Diagnosis present

## 2016-12-04 DIAGNOSIS — R7989 Other specified abnormal findings of blood chemistry: Secondary | ICD-10-CM | POA: Diagnosis present

## 2016-12-04 DIAGNOSIS — I2510 Atherosclerotic heart disease of native coronary artery without angina pectoris: Secondary | ICD-10-CM | POA: Diagnosis present

## 2016-12-04 DIAGNOSIS — Z951 Presence of aortocoronary bypass graft: Secondary | ICD-10-CM

## 2016-12-04 DIAGNOSIS — E7800 Pure hypercholesterolemia, unspecified: Secondary | ICD-10-CM | POA: Diagnosis present

## 2016-12-04 DIAGNOSIS — I9589 Other hypotension: Secondary | ICD-10-CM

## 2016-12-04 DIAGNOSIS — F1010 Alcohol abuse, uncomplicated: Secondary | ICD-10-CM | POA: Diagnosis present

## 2016-12-04 DIAGNOSIS — I482 Chronic atrial fibrillation: Secondary | ICD-10-CM | POA: Diagnosis present

## 2016-12-04 DIAGNOSIS — I428 Other cardiomyopathies: Secondary | ICD-10-CM | POA: Diagnosis present

## 2016-12-04 DIAGNOSIS — Z7901 Long term (current) use of anticoagulants: Secondary | ICD-10-CM

## 2016-12-04 DIAGNOSIS — I255 Ischemic cardiomyopathy: Secondary | ICD-10-CM | POA: Diagnosis present

## 2016-12-04 DIAGNOSIS — N179 Acute kidney failure, unspecified: Secondary | ICD-10-CM | POA: Diagnosis present

## 2016-12-04 DIAGNOSIS — R001 Bradycardia, unspecified: Secondary | ICD-10-CM | POA: Diagnosis present

## 2016-12-04 DIAGNOSIS — N183 Chronic kidney disease, stage 3 unspecified: Secondary | ICD-10-CM | POA: Diagnosis present

## 2016-12-04 DIAGNOSIS — I4821 Permanent atrial fibrillation: Secondary | ICD-10-CM | POA: Diagnosis present

## 2016-12-04 DIAGNOSIS — I5043 Acute on chronic combined systolic (congestive) and diastolic (congestive) heart failure: Secondary | ICD-10-CM

## 2016-12-04 DIAGNOSIS — I959 Hypotension, unspecified: Secondary | ICD-10-CM | POA: Diagnosis present

## 2016-12-04 DIAGNOSIS — Z884 Allergy status to anesthetic agent status: Secondary | ICD-10-CM

## 2016-12-04 DIAGNOSIS — I480 Paroxysmal atrial fibrillation: Secondary | ICD-10-CM | POA: Diagnosis present

## 2016-12-04 DIAGNOSIS — Z6820 Body mass index (BMI) 20.0-20.9, adult: Secondary | ICD-10-CM

## 2016-12-04 DIAGNOSIS — Z87891 Personal history of nicotine dependence: Secondary | ICD-10-CM | POA: Diagnosis not present

## 2016-12-04 DIAGNOSIS — M109 Gout, unspecified: Secondary | ICD-10-CM | POA: Diagnosis present

## 2016-12-04 DIAGNOSIS — Z7982 Long term (current) use of aspirin: Secondary | ICD-10-CM

## 2016-12-04 DIAGNOSIS — D631 Anemia in chronic kidney disease: Secondary | ICD-10-CM | POA: Diagnosis present

## 2016-12-04 DIAGNOSIS — I214 Non-ST elevation (NSTEMI) myocardial infarction: Secondary | ICD-10-CM

## 2016-12-04 DIAGNOSIS — I5023 Acute on chronic systolic (congestive) heart failure: Secondary | ICD-10-CM | POA: Diagnosis not present

## 2016-12-04 DIAGNOSIS — E785 Hyperlipidemia, unspecified: Secondary | ICD-10-CM | POA: Diagnosis present

## 2016-12-04 DIAGNOSIS — I130 Hypertensive heart and chronic kidney disease with heart failure and stage 1 through stage 4 chronic kidney disease, or unspecified chronic kidney disease: Secondary | ICD-10-CM | POA: Diagnosis present

## 2016-12-04 DIAGNOSIS — Z95810 Presence of automatic (implantable) cardiac defibrillator: Secondary | ICD-10-CM

## 2016-12-04 LAB — COMPREHENSIVE METABOLIC PANEL
ALT: 55 U/L (ref 17–63)
ANION GAP: 9 (ref 5–15)
AST: 73 U/L — ABNORMAL HIGH (ref 15–41)
Albumin: 2.7 g/dL — ABNORMAL LOW (ref 3.5–5.0)
Alkaline Phosphatase: 88 U/L (ref 38–126)
BILIRUBIN TOTAL: 1 mg/dL (ref 0.3–1.2)
BUN: 79 mg/dL — AB (ref 6–20)
CO2: 24 mmol/L (ref 22–32)
Calcium: 8.9 mg/dL (ref 8.9–10.3)
Chloride: 108 mmol/L (ref 101–111)
Creatinine, Ser: 3.41 mg/dL — ABNORMAL HIGH (ref 0.61–1.24)
GFR calc Af Amer: 19 mL/min — ABNORMAL LOW (ref >60)
GFR, EST NON AFRICAN AMERICAN: 16 mL/min — AB (ref >60)
Glucose, Bld: 105 mg/dL — ABNORMAL HIGH (ref 65–99)
POTASSIUM: 5 mmol/L (ref 3.5–5.1)
Sodium: 141 mmol/L (ref 135–145)
Total Protein: 5.3 g/dL — ABNORMAL LOW (ref 6.5–8.1)

## 2016-12-04 LAB — CBC WITH DIFFERENTIAL/PLATELET
BASOS PCT: 0 %
Basophils Absolute: 0 10*3/uL (ref 0.0–0.1)
EOS ABS: 0.7 10*3/uL (ref 0.0–0.7)
Eosinophils Relative: 8 %
HEMATOCRIT: 31.5 % — AB (ref 39.0–52.0)
Hemoglobin: 9.5 g/dL — ABNORMAL LOW (ref 13.0–17.0)
Lymphocytes Relative: 10 %
Lymphs Abs: 0.9 10*3/uL (ref 0.7–4.0)
MCH: 27.9 pg (ref 26.0–34.0)
MCHC: 30.2 g/dL (ref 30.0–36.0)
MCV: 92.6 fL (ref 78.0–100.0)
MONO ABS: 1.2 10*3/uL — AB (ref 0.1–1.0)
Monocytes Relative: 14 %
NEUTROS ABS: 6 10*3/uL (ref 1.7–7.7)
NEUTROS PCT: 68 %
Platelets: 152 10*3/uL (ref 150–400)
RBC: 3.4 MIL/uL — ABNORMAL LOW (ref 4.22–5.81)
RDW: 18.3 % — AB (ref 11.5–15.5)
WBC: 8.8 10*3/uL (ref 4.0–10.5)

## 2016-12-04 LAB — URINALYSIS, ROUTINE W REFLEX MICROSCOPIC
Bilirubin Urine: NEGATIVE
Glucose, UA: NEGATIVE mg/dL
HGB URINE DIPSTICK: NEGATIVE
KETONES UR: NEGATIVE mg/dL
Leukocytes, UA: NEGATIVE
NITRITE: NEGATIVE
PH: 5 (ref 5.0–8.0)
Protein, ur: NEGATIVE mg/dL
Specific Gravity, Urine: 1.009 (ref 1.005–1.030)

## 2016-12-04 LAB — I-STAT TROPONIN, ED
TROPONIN I, POC: 0.1 ng/mL — AB (ref 0.00–0.08)
TROPONIN I, POC: 0.06 ng/mL (ref 0.00–0.08)

## 2016-12-04 LAB — BRAIN NATRIURETIC PEPTIDE: B NATRIURETIC PEPTIDE 5: 1974.7 pg/mL — AB (ref 0.0–100.0)

## 2016-12-04 LAB — LACTIC ACID, PLASMA
Lactic Acid, Venous: 1 mmol/L (ref 0.5–1.9)
Lactic Acid, Venous: 1.7 mmol/L (ref 0.5–1.9)

## 2016-12-04 LAB — PROTIME-INR
INR: 2.89
Prothrombin Time: 30.8 seconds — ABNORMAL HIGH (ref 11.4–15.2)

## 2016-12-04 LAB — DIGOXIN LEVEL
Digoxin Level: 2.6 ng/mL (ref 0.8–2.0)
Digoxin Level: 1.9 ng/mL (ref 0.8–2.0)

## 2016-12-04 MED ORDER — NITROGLYCERIN 0.4 MG SL SUBL: 0.4000 mg | SUBLINGUAL_TABLET | SUBLINGUAL | Status: DC | PRN

## 2016-12-04 MED ORDER — ACETAMINOPHEN 325 MG PO TABS
650.0000 mg | ORAL_TABLET | ORAL | Status: DC | PRN
  Administered 2016-12-05: 650 mg via ORAL
  Filled 2016-12-04: qty 2

## 2016-12-04 MED ORDER — ADULT MULTIVITAMIN W/MINERALS CH
1.0000 | ORAL_TABLET | Freq: Every day | ORAL | Status: DC
  Administered 2016-12-05 – 2016-12-06 (×2): 1 via ORAL
  Filled 2016-12-04 (×3): qty 1

## 2016-12-04 MED ORDER — WARFARIN SODIUM 3 MG PO TABS: 6.0000 mg | ORAL_TABLET | Freq: Once | ORAL | Status: DC

## 2016-12-04 MED ORDER — ZOLPIDEM TARTRATE 5 MG PO TABS: 5.0000 mg | ORAL_TABLET | Freq: Every evening | ORAL | Status: DC | PRN

## 2016-12-04 MED ORDER — METOPROLOL SUCCINATE ER 25 MG PO TB24
25.0000 mg | ORAL_TABLET | Freq: Every day | ORAL | Status: DC
  Administered 2016-12-05: 25 mg via ORAL
  Filled 2016-12-04: qty 1

## 2016-12-04 MED ORDER — SODIUM CHLORIDE 0.9 % IV SOLN
4.0000 | Freq: Once | INTRAVENOUS | Status: DC
  Filled 2016-12-04: qty 160

## 2016-12-04 MED ORDER — ALPRAZOLAM 0.25 MG PO TABS: 0.2500 mg | ORAL_TABLET | Freq: Two times a day (BID) | ORAL | Status: DC | PRN

## 2016-12-04 MED ORDER — WARFARIN - PHARMACIST DOSING INPATIENT: Freq: Every day | Status: DC

## 2016-12-04 MED ORDER — ALLOPURINOL 100 MG PO TABS
100.0000 mg | ORAL_TABLET | Freq: Every day | ORAL | Status: DC
  Administered 2016-12-05 – 2016-12-06 (×2): 100 mg via ORAL
  Filled 2016-12-04 (×2): qty 1

## 2016-12-04 MED ORDER — SODIUM CHLORIDE 0.9 % IV SOLN: 250.0000 mL | INTRAVENOUS | Status: DC | PRN

## 2016-12-04 MED ORDER — FUROSEMIDE 10 MG/ML IJ SOLN: 80.0000 mg | Freq: Once | INTRAMUSCULAR | Status: DC

## 2016-12-04 MED ORDER — ONDANSETRON HCL 4 MG/2ML IJ SOLN: 4.0000 mg | Freq: Four times a day (QID) | INTRAMUSCULAR | Status: DC | PRN

## 2016-12-04 MED ORDER — SODIUM CHLORIDE 0.9% FLUSH
3.0000 mL | Freq: Two times a day (BID) | INTRAVENOUS | Status: DC
  Administered 2016-12-04 – 2016-12-06 (×4): 3 mL via INTRAVENOUS

## 2016-12-04 MED ORDER — ATORVASTATIN CALCIUM 40 MG PO TABS
40.0000 mg | ORAL_TABLET | Freq: Every morning | ORAL | Status: DC
  Administered 2016-12-05 – 2016-12-06 (×2): 40 mg via ORAL
  Filled 2016-12-04 (×2): qty 1

## 2016-12-04 MED ORDER — SODIUM CHLORIDE 0.9 % IV BOLUS (SEPSIS)
1000.0000 mL | Freq: Once | INTRAVENOUS | Status: AC
  Administered 2016-12-04: 1000 mL via INTRAVENOUS

## 2016-12-04 MED ORDER — SODIUM CHLORIDE 0.9% FLUSH
3.0000 mL | INTRAVENOUS | Status: DC | PRN
  Administered 2016-12-05: 3 mL via INTRAVENOUS
  Filled 2016-12-04: qty 3

## 2016-12-04 NOTE — H&P (Signed)
History and Physical   Patient ID: Jason Choi MRN: [id number], DOB/AGE: [id number] y.o. Date of Encounter: 12/04/2016  Primary Care Provider: Heywood Bene, PA-C Primary Cardiologist: Dr Martinique 07/26/2016, Dr [name] 08/16/2016 Primary Electrophysiologist:  Dr Curt Bears 10/10/2016  Chief Complaint:  Hypotension  Patient Profile:   Jason Choi is a 76 y.o. male with a hx of CABG 2002 w/ LIMA-LAD, Rad-RI, SVG-PDA, S-CHF, ICM w/ EF 15% by echo 07/2016, atrial fib on amio and coumadin, HTN, HLD, OA, PAH, syncope w/ NSVT>>MDT ICD. CHA2DS2-VASc Score=4 (age x 1, HTN, CAD, CHF).  History of Present Illness:   Last admit was 03/01-03/15/2018 for CHF w/ low output. PICC placed with initial mixed venous saturation depressed at 44%. Started on milrinone with some improvement.  Required norepinephrine for pressure support. He was diuresed fairly extensively with some rise in creatinine, though this stabilized.  Patient diuresed about 30 lbs off in the hospital. Had RHC prior to d/c that showed preserved cardiac output and normal filling pressures (mean RA 8, PA 58/22, mean PCWP 13, CI 3.56 Fick, CI 2.7 thermo). Wt 182 at 03/21 office visit.   ER visit 07/05 for elevated BNP (?pro-BNP) drawn by PCP. Pt felt SOB was at baseline. Wt was 180. BUN/Cr were elevated above baseline at 81/3.13. Baseline is BUN/Cr 47/2.21. BNP not drawn here. Pt desat to 70% w/ ambulation, pt refused admission. Pt was given Lasix 40 mg IV x 1 and went home.  He was at the [facility] today and was asked to come to the ER for hypotension and weakness.   He admits that he is weaker today than usual. However, he also feels he has been getting gradually weaker over time. He admits that his PO intake is poor. Since his wife died, he has been busy doing things for her estate, not eating as he would normally do. He also has not been working, he used to eat better when he was working. His weight has been  trending down slowly and he was 174 lbs on his scale this am.   He has orthopnea and DOE. Denies PND or LE edema. He is compliant with medications. He feels better now that his HR is up (pacing at 80) and his BP has improved. Although SBP has been 80s-90s, his MAP has generally been >60.  He has not had fevers or chills. He has not had cough or cold sx. He has not had dysuria. He has chronic joint issues.   He has a wound on his R anterior ankle, that is being managed by the wound care center. It is bandaged and dressed. He also has bandages and dressings on his L elbow   Past Medical History:  Diagnosis Date  . AICD (automatic cardioverter/defibrillator) present   . Anemia    Referral to GI Feb 2013  . Arthritis    "hands primarily" (10/12/2016)  . Atrial fibrillation ([place])    ON AMIODARONE: PER VISIT NOTE IN [facility]  . CHF (congestive heart failure) ([place])   . Chronic anticoagulation    on coumadin  . Chronic kidney disease (CKD), stage II (mild)   . Gout   . High risk medication use    on amiodarone  . HTN (hypertension)   . Hypercholesteremia   . LV dysfunction    EF 35 to 40% per echo May 2012; EF remains 35 to 40% per echo Feb 2013. Referred for ICD  . Myocardial  infarction ([place])   . Osteoarthritis of right shoulder region    ACUTE PAIN  . 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 Pulmonary hypertension, moderate to severe [facility])     Surgical History:  Past Surgical History:  Procedure Laterality Date  . CARDIAC CATHETERIZATION  2009   Grafts patent  . CARDIAC CATHETERIZATION  2012   Grafts patent. EF was 25 to 30% but 35 to 40 by echo  . CARDIAC CATHETERIZATION  2003  . CARDIAC DEFIBRILLATOR PLACEMENT  10/12/2016   MDT single chamber device  . CARDIOVERSION  2008--  FOR PAF   SUCCESSFUL  . CARDIOVERSION N/A 06/13/2013   Procedure: CARDIOVERSION;  Surgeon: Peter M Martinique, MD;  Location: [facility] ENDOSCOPY;  Service: Cardiovascular;  Laterality: N/A;  . CATARACT  EXTRACTION, BILATERAL Bilateral   . CORONARY ARTERY BYPASS GRAFT  2002   SVG to PDA, Free radial to Intermediate, LIMA to LAD by Dr Prescott Gum  . ICD IMPLANT N/A 10/12/2016   Procedure: ICD Implant;  Surgeon: Constance Haw, MD;  Location: [facility] CV LAB;  Service: Cardiovascular;  Laterality: N/A;  . KNEE ARTHROSCOPY Left 2000  . RIGHT HEART CATH N/A 08/08/2016   Procedure: Right Heart Cath;  Surgeon: Larey Dresser, MD;  Location: [facility] CV LAB;  Service: Cardiovascular;  Laterality: N/A;  . Right Leg surgeries Right X 5-6   due to polio; "relocated muscles; pins in ankle"  . SHOULDER ARTHROSCOPY W/ ROTATOR CUFF REPAIR Right 05/16/2011     I have reviewed the patient's current medications. Medication Sig  allopurinol (ZYLOPRIM) 100 MG tablet Take 1 tablet (100 mg total) by mouth daily.  aspirin EC 81 MG tablet Take 81 mg by mouth daily.  atorvastatin (LIPITOR) 40 MG tablet Take 1 tablet (40 mg total) by mouth every evening. Patient taking differently: Take 40 mg by mouth every morning.   digoxin (LANOXIN) 0.125 MG tablet Take 1 tablet (0.125 mg total) by mouth daily.  metoprolol succinate (TOPROL-XL) 25 MG 24 hr tablet Take 1 tablet (25 mg total) by mouth daily.  Multiple Vitamin (MULTI-VITAMIN DAILY) TABS Take 1 tablet by mouth daily.  potassium chloride SA (KLOR-CON M20) 20 MEQ tablet Take 1 tablet (20 mEq total) by mouth daily.  spironolactone (ALDACTONE) 25 MG tablet Take 0.5 tablets (12.5 mg total) by mouth daily.  torsemide (DEMADEX) 20 MG tablet Take 3 tablets (60 mg total) by mouth daily.  warfarin (COUMADIN) 6 MG tablet Take 6 mg by mouth See admin instructions. Takes 6 mg (1 tablet)  daily except for Saturday take 3mg  (1/2 tablet)  acetaminophen (TYLENOL) 325 MG tablet Take 2 tablets (650 mg total) by mouth every 4 (four) hours as needed for headache or mild pain.   Scheduled Meds: Continuous Infusions: PRN Meds:.  Allergies:  Allergies  Allergen Reactions    . Lidocaine Other (See Comments)    Passed out - at the dentist's office  . Procaine Hcl Other (See Comments)    Passed out - at the dentist's office    Social History   Social History  . Marital status: Widowed    Spouse name: N/A  . Number of children: 1  . Years of education: N/A   Occupational History  . 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  [employer]   Social History Main Topics  . Smoking status: Former Smoker    Packs/day: 2.00    Years: 20.00  Types: Cigarettes  . Smokeless tobacco: Never Used     Comment: 10/12/2016 "haven't had a cigarette since 1980"  . Alcohol use 7.2 oz/week    4 Shots of liquor, 8 Cans of beer per week     Comment: 10/12/2016 "couple beers and a shot of [place] most  3-4 times/week"  . Drug use: No  . Sexual activity: No   Other Topics Concern  . Not on file   Social History Narrative   Pt lives in [place] with spouse (who is a Systems analyst).  1 grown healthy daughter who is a Insurance claims handler.       Retired 2003 from [employer].  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    Family History  Problem Relation Age of Onset  . Valvular heart disease Father   . Colon cancer Neg Hx   . Colon polyps Neg Hx   . Rectal cancer Neg Hx   . Stomach cancer Neg Hx    Family Status  Relation Status  . Father Deceased at age 38       Aortic valve disease  . Mother Deceased at age 31       natural causes  . Sister Alive  . MGM Deceased  . MGF Deceased  . PGM Deceased  . PGF Deceased  . Neg Hx (Not Specified)    Review of Systems:   Full 14-point review of systems otherwise negative except as noted above.  Physical Exam: Blood pressure 99/62, pulse 61, temperature (!) 97.5 F (36.4 C), temperature source Oral, resp. rate 16, height 6\' 3"  (1.905 m), weight 180 lb (81.6 kg), SpO2 93 %. General: Well developed, thin elderly,male in no acute distress. Head: Normocephalic,  atraumatic, sclera non-icteric, no xanthomas, nares are without discharge. Dentition: poor Neck: No carotid bruits. JVD elevated 10-11 cm. No thyromegally Lungs: Good expansion bilaterally. without wheezes or rhonchi. Rales bases Heart: Regular rate and rhythm with S1 S2.  No S3 or S4.  Soft murmur, no rubs, or gallops appreciated. Abdomen: Soft, non-tender, non-distended with normoactive bowel sounds. No hepatomegaly. No rebound/guarding. No obvious abdominal masses. Msk:  Strength and tone appear weak for age. Chronic joint deformities, no effusions, no spine or costo-vertebral angle tenderness. Extremities: No clubbing or cyanosis. No edema.  Distal pedal pulses are 2+ in 4 extrem Neuro: Alert and oriented X 3. Moves all extremities spontaneously. No focal deficits noted. Psych:  Responds to questions appropriately with a normal affect. Skin: No rashes or lesions noted  Labs:   Lab Results  Component Value Date   WBC 8.8 12/04/2016   HGB 9.5 (L) 12/04/2016   HCT 31.5 (L) 12/04/2016   MCV 92.6 12/04/2016   PLT 152 12/04/2016    Recent Labs  12/04/16 1128  INR 2.89     Recent Labs Lab 12/04/16 1128  NA 141  K 5.0  CL 108  CO2 24  BUN 79*  CREATININE 3.41*  CALCIUM 8.9  PROT 5.3*  BILITOT 1.0  ALKPHOS 88  ALT 55  AST 73*  GLUCOSE 105*   No results for input(s): CKTOTAL, CKMB, TROPONINI in the last 72 hours.  Recent Labs  12/04/16 1152 12/04/16 1558  TROPIPOC 0.10* 0.06   B Natriuretic Peptide  Date/Time Value Ref Range Status  12/04/2016 11:28 AM 1,974.7 (H) 0.0 - 100.0 pg/mL Final  09/20/2016 01:56 PM 1,989.5 (H) 0.0 - 100.0 pg/mL Final  08/30/2016 11:41 AM 473.6 (H) 0.0 - 100.0 pg/mL  Final    Radiology/Studies: Dg Chest Portable 1 View Result Date: 12/04/2016 CLINICAL DATA:  Hypotension. EXAM: PORTABLE CHEST 1 VIEW COMPARISON:  11/30/2016 FINDINGS: The heart is enlarged but appears stable. Prominent mediastinal and hilar contours are unchanged. Stable  surgical changes from bypass surgery. The right ventricular pacer wires in stable position. There is central vascular congestion and mild interstitial pulmonary edema suggesting CHF. Small left pleural effusion is likely. Bibasilar atelectasis. IMPRESSION: CHF with small left effusion and bibasilar atelectasis. Electronically Signed   By: Marijo Sanes M.D.   On: 12/04/2016 12:42     Cardiac Cath: RHC Procedural Findings: 08/08/2016 Hemodynamics (mmHg) RA mean 8 RV 53/7 PA 58/22, mean 33 PCWP mean 13  Oxygen saturations: PA 66% AO 94%  Cardiac Output (Fick) 7.73  Cardiac Index (Fick) 3.56  PVR 2.6 WU  Cardiac Output (Thermo) 5.86 Cardiac Index (Thermo) 2.70 PVR 3.4 WU   Echo: 07/28/2016 - Left ventricle: The cavity size was severely dilated. Wall thickness was normal. Systolic function was severely reduced. The estimated ejection fraction was 15%. Diffuse hypokinesis. There is akinesis of the inferolateral myocardium. Doppler parameters are consistent with high ventricular filling pressure. - Mitral valve: Calcified annulus. There was severe regurgitation. - Left atrium: The atrium was severely dilated. - Right ventricle: Systolic function was mildly reduced. - Right atrium: The atrium was moderately dilated. - Pulmonary arteries: Systolic pressure was moderately increased. PA peak pressure: 49 mm Hg (S). Impressions: - Definity used; severe LV dysfunction; severe LVE; elevated LV filling pressure; biatrial enlargement; severe MR; mildly reduced RV function; mild TR with moderately elevated pulmonary pressure.  ECG: Atrial fib, HR 30s-40s  ASSESSMENT AND PLAN:  Principal Problem:   Symptomatic bradycardia - Dr Caryl Comes changed device parameters, baseline pace rate is now 80 bpm - when HR increased, BP improved - Dig toxicity may have contributed, level is pending - not sure if afib has contributed to his worsening EF, discuss with MD.   Active  Problems:   Acute on chronic systolic and diastolic heart failure, NYHA class 3 (HCC) - He is volume overloaded on exam, but BUN/Cr are higher than normal for him - Briefly spoke with Dr Haroldine Laws, give Lasix 80 mg IV x 1. - follow I/O, weights     Permanent atrial fibrillation ([place]) - follow HR     CKD (chronic kidney disease) stage 3, GFR 30-59 ml/min - acute on chronic as both BUN/Cr are higher than usual - Dr Caryl Comes curbsided Nephrology, they are available prn, said to consult them in am if renal function worsens   Signed, Rosaria Ferries, PA-C 12/04/2016 5:05 PM Bee[facility] [phone]

## 2016-12-04 NOTE — Progress Notes (Signed)
ANTICOAGULATION CONSULT NOTE - Initial Consult  Pharmacy Consult for Warfarin Indication: atrial fibrillation  Allergies  Allergen Reactions  . Lidocaine Other (See Comments)    Passed out - at the dentist's office  . Procaine Hcl Other (See Comments)    Passed out - at the dentist's office    Patient Measurements: Height: 6\' 3"  (190.5 cm) Weight: 180 lb (81.6 kg) IBW/kg (Calculated) : 84.5  Vital Signs: Temp: 97.5 F (36.4 C) (07/09 1047) Temp Source: Oral (07/09 1047) BP: 85/58 (07/09 1815) Pulse Rate: 82 (07/09 1815)  Labs:  Recent Labs  12/04/16 1128  HGB 9.5*  HCT 31.5*  PLT 152  LABPROT 30.8*  INR 2.89  CREATININE 3.41*    Estimated Creatinine Clearance: 21.9 mL/min (A) (by C-G formula based on SCr of 3.41 mg/dL (H)).   Medical History: Past Medical History:  Diagnosis Date  . AICD (automatic cardioverter/defibrillator) present   . Anemia    Referral to GI Feb 2013  . Arthritis    "hands primarily" (10/12/2016)  . Atrial fibrillation ([place])    ON AMIODARONE: PER VISIT NOTE IN [facility] HB  . CHF (congestive heart failure) ([place])   . Chronic anticoagulation    on coumadin  . Chronic kidney disease (CKD), stage II (mild)   . Gout   . High risk medication use    on amiodarone  . HTN (hypertension)   . Hypercholesteremia   . LV dysfunction    EF 35 to 40% per echo May 2012; EF remains 35 to 40% per echo Feb 2013. Referred for ICD  . Myocardial infarction ([place])   . Osteoarthritis of right shoulder region    ACUTE PAIN  . 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 Pulmonary hypertension, moderate to severe (HCC)     Medications:  Warfarin 6mg  po daily except 3mg  on Saturday  Assessment: 76 year old male on chronic Warfarin for atrial fibrillation here with weakness and hypotension. Found to be in AKI with SCr >3 and Digoxin level 2.6. INR today is 2.89 - therapeutic on home regimen. Last dose of warfarin per patient was 12/03/16.   Goal of  Therapy:  INR 2-3 Monitor platelets by anticoagulation protocol: Yes   Plan:  Warfarin 6 mg po x1 tonight. Daily PT/INR  Sloan Leiter, PharmD, BCPS Clinical Pharmacist Clinical phone 12/04/2016 until 11PM [phone] After hours, please call [phone] 12/04/2016,6:21 PM

## 2016-12-04 NOTE — ED Notes (Signed)
Pt aware of need for urine sample. Pt stated he would try at this time. Pt provided with urinal.

## 2016-12-04 NOTE — ED Provider Notes (Signed)
[facility] DEPT Provider Note   CSN: [id number] Arrival date & time: 12/04/16  1034     History   Chief Complaint Chief Complaint  Patient presents with  . Hypotension    HPI Jason Choi is a 76 y.o. male with h/o CAD s/p CABG, ICM, systolic CHF (EF 19%), ICD, atrial fibrillation on coumadin presents to ED from wound care clinic for evaluation of low BP and HR. Patient states he feels well and at baseline, denies generalized weakness, CP, worsening SOB, cough, n/v, abdominal pain, melena. His aid however reports patient has not eaten as well as usual, she does not think he has eaten much over the weekend home alone. Aide also reports he is weak, however patient denies this. Patient was seen in ED four days ago for shortness of breath, he was recommended admission but patient declined and requested to go home. He has a scheduled appointment with cardiologist tomorrow. Patient states his SOB is much better, he only has exertional SOB which is his baseline. No supplemental O2 at home. No recent falls, fevers, URI symptoms, dysuria. Wounds treated at wound center today are healing well.   HPI  Past Medical History:  Diagnosis Date  . AICD (automatic cardioverter/defibrillator) present   . Anemia    Referral to GI Feb 2013  . Arthritis    "hands primarily" (10/12/2016)  . Atrial fibrillation ([place])    ON AMIODARONE: PER VISIT NOTE IN [facility]  . CHF (congestive heart failure) ([place])   . Chronic anticoagulation    on coumadin  . Chronic kidney disease (CKD), stage II (mild)   . Gout   . High risk medication use    on amiodarone  . HTN (hypertension)   . Hypercholesteremia   . LV dysfunction    EF 35 to 40% per echo May 2012; EF remains 35 to 40% per echo Feb 2013. Referred for ICD  . Myocardial infarction ([place])   . Osteoarthritis of right shoulder region    ACUTE PAIN  . 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 Pulmonary hypertension, moderate to severe [facility])      Patient Active Problem List   Diagnosis Date Noted  . CHF (congestive heart failure) ([place]) 10/12/2016  . Gout 08/16/2016  . Syncope 07/26/2016  . Encounter for therapeutic drug monitoring 07/08/2013  . Hyponatremia 01/03/2012  . CKD (chronic kidney disease) stage 3, GFR 30-59 ml/min   . Anemia   . CAD (coronary artery disease)   . HTN (hypertension)   . Hypercholesteremia   . Chronic anticoagulation   . Chronic systolic CHF (congestive heart failure) ([place])   . Permanent atrial fibrillation ([place]) 08/22/2010    Past Surgical History:  Procedure Laterality Date  . CARDIAC CATHETERIZATION  2009   Grafts patent  . CARDIAC CATHETERIZATION  2012   Grafts patent. EF was 25 to 30% but 35 to 40 by echo  . CARDIAC CATHETERIZATION  2003  . CARDIAC DEFIBRILLATOR PLACEMENT  10/12/2016  . CARDIOVERSION  2008--  FOR PAF   SUCCESSFUL  . CARDIOVERSION N/A 06/13/2013   Procedure: CARDIOVERSION;  Surgeon: Peter M Martinique, MD;  Location: [facility] ENDOSCOPY;  Service: Cardiovascular;  Laterality: N/A;  . CATARACT EXTRACTION, BILATERAL Bilateral   . CORONARY ARTERY BYPASS GRAFT  2002   SVG to PDA, Free radial to Intermediate, LIMA to LAD by Dr Prescott Gum  . ICD IMPLANT N/A 10/12/2016   Procedure: ICD Implant;  Surgeon: Constance Haw, MD;  Location: [place] CV LAB;  Service: Cardiovascular;  Laterality: N/A;  . KNEE ARTHROSCOPY Left 2000  . RIGHT HEART CATH N/A 08/08/2016   Procedure: Right Heart Cath;  Surgeon: Larey Dresser, MD;  Location: [place] CV LAB;  Service: Cardiovascular;  Laterality: N/A;  . Right Leg surgeries Right X 5-6   due to polio; "relocated muscles; pins in ankle"  . SHOULDER ARTHROSCOPY W/ ROTATOR CUFF REPAIR Right 05/16/2011       Home Medications    Prior to Admission medications   Medication Sig Start Date End Date Taking? Authorizing Provider  allopurinol (ZYLOPRIM) 100 MG tablet Take 1 tablet (100 mg total) by mouth daily. 09/21/16  Yes Larey Dresser,  MD  aspirin EC 81 MG tablet Take 81 mg by mouth daily.   Yes [provider]  atorvastatin (LIPITOR) 40 MG tablet Take 1 tablet (40 mg total) by mouth every evening. Patient taking differently: Take 40 mg by mouth every morning.  09/21/16  Yes Larey Dresser, MD  digoxin (LANOXIN) 0.125 MG tablet Take 1 tablet (0.125 mg total) by mouth daily. 09/21/16  Yes Larey Dresser, MD  metoprolol succinate (TOPROL-XL) 25 MG 24 hr tablet Take 1 tablet (25 mg total) by mouth daily. 09/21/16  Yes Larey Dresser, MD  Multiple Vitamin (MULTI-VITAMIN DAILY) TABS Take 1 tablet by mouth daily.   Yes [provider]  potassium chloride SA (KLOR-CON M20) 20 MEQ tablet Take 1 tablet (20 mEq total) by mouth daily. 09/21/16  Yes Larey Dresser, MD  spironolactone (ALDACTONE) 25 MG tablet Take 0.5 tablets (12.5 mg total) by mouth daily. 09/21/16  Yes Larey Dresser, MD  torsemide (DEMADEX) 20 MG tablet Take 3 tablets (60 mg total) by mouth daily. 09/21/16  Yes Larey Dresser, MD  warfarin (COUMADIN) 6 MG tablet Take 6 mg by mouth See admin instructions. Takes 6 mg (1 tablet)  daily except for Saturday take 3mg  (1/2 tablet)   Yes [provider]  acetaminophen (TYLENOL) 325 MG tablet Take 2 tablets (650 mg total) by mouth every 4 (four) hours as needed for headache or mild pain. 08/10/16   Shirley Friar, PA-C    Family History Family History  Problem Relation Age of Onset  . Valvular heart disease Father   . Colon cancer Neg Hx   . Colon polyps Neg Hx   . Rectal cancer Neg Hx   . Stomach cancer Neg Hx     Social History Social History  Substance Use Topics  . Smoking status: Former Smoker    Packs/day: 2.00    Years: 20.00    Types: Cigarettes  . Smokeless tobacco: Never Used     Comment: 10/12/2016 "haven't had a cigarette since 1980"  . Alcohol use 7.2 oz/week    4 Shots of liquor, 8 Cans of beer per week     Comment: 10/12/2016 "couple beers and a shot of crown  royal most  3-4 times/week"     Allergies   Lidocaine and Procaine hcl   Review of Systems Review of Systems  Constitutional: Positive for appetite change. Negative for chills and fever.  HENT: Negative for congestion and sore throat.   Respiratory: Positive for shortness of breath. Negative for cough and chest tightness.   Cardiovascular: Negative for chest pain and palpitations.  Gastrointestinal: Negative for abdominal pain, diarrhea, nausea and vomiting.  Genitourinary: Negative for difficulty urinating, dysuria, frequency and hematuria.  Musculoskeletal: Negative for myalgias.  Skin: Negative for rash.  Neurological: Positive for weakness. Negative for syncope, light-headedness, numbness and headaches.  Psychiatric/Behavioral: Negative for confusion.     Physical Exam Updated Vital Signs BP 99/62   Pulse 61   Temp (!) 97.5 F (36.4 C) (Oral)   Resp 16   Ht 6\' 3"  (1.905 m)   Wt 81.6 kg (180 lb)   SpO2 93%   BMI 22.50 kg/m   Physical Exam  Constitutional: He is oriented to person, place, and time. He appears well-developed and well-nourished. No distress.  HENT:  Head: Normocephalic and atraumatic.  Nose: Nose normal.  Mouth/Throat: Oropharynx is clear and moist. No oropharyngeal exudate.  Eyes: Conjunctivae and EOM are normal. Pupils are equal, round, and reactive to light.  Neck: Normal range of motion. Neck supple.  Cardiovascular: Normal rate, regular rhythm, normal heart sounds and intact distal pulses.   No murmur heard. No JVD. No LE edema  Pulmonary/Chest: Effort normal and breath sounds normal. No respiratory distress. He has no wheezes. He has no rales.  Abdominal: Soft. Bowel sounds are normal. He exhibits no distension. There is no tenderness.  Musculoskeletal: Normal range of motion. He exhibits no deformity.  Lymphadenopathy:    He has no cervical adenopathy.  Neurological: He is alert and oriented to person, place, and time.  A&O x 3. Speech and  phonation normal.   Thought process coherent.   Strength 5/5 in upper and lower extremities.   Sensation to light touch intact in upper and lower extremities.   Skin: Skin is warm and dry. Capillary refill takes less than 2 seconds.  Psychiatric: He has a normal mood and affect. His behavior is normal. Judgment and thought content normal.  Nursing note and vitals reviewed.    ED Treatments / Results  Labs (all labs ordered are listed, but only abnormal results are displayed) Labs Reviewed  COMPREHENSIVE METABOLIC PANEL - Abnormal; Notable for the following:       Result Value   Glucose, Bld 105 (*)    BUN 79 (*)    Creatinine, Ser 3.41 (*)    Total Protein 5.3 (*)    Albumin 2.7 (*)    AST 73 (*)    GFR calc non Af Amer 16 (*)    GFR calc Af Amer 19 (*)    All other components within normal limits  BRAIN NATRIURETIC PEPTIDE - Abnormal; Notable for the following:    B Natriuretic Peptide 1,974.7 (*)    All other components within normal limits  CBC WITH DIFFERENTIAL/PLATELET - Abnormal; Notable for the following:    RBC 3.40 (*)    Hemoglobin 9.5 (*)    HCT 31.5 (*)    RDW 18.3 (*)    Monocytes Absolute 1.2 (*)    All other components within normal limits  PROTIME-INR - Abnormal; Notable for the following:    Prothrombin Time 30.8 (*)    All other components within normal limits  I-STAT TROPOININ, ED - Abnormal; Notable for the following:    Troponin i, poc 0.10 (*)    All other components within normal limits  URINALYSIS, ROUTINE W REFLEX MICROSCOPIC  DIGOXIN LEVEL    EKG  EKG Interpretation None       Radiology Dg Chest Portable 1 View  Result Date: 12/04/2016 CLINICAL DATA:  Hypotension. EXAM: PORTABLE CHEST 1 VIEW COMPARISON:  11/30/2016 FINDINGS: The heart is enlarged but appears stable. Prominent mediastinal and hilar contours are unchanged. Stable surgical changes from bypass surgery.  The right ventricular pacer wires in stable position. There is central  vascular congestion and mild interstitial pulmonary edema suggesting CHF. Small left pleural effusion is likely. Bibasilar atelectasis. IMPRESSION: CHF with small left effusion and bibasilar atelectasis. Electronically Signed   By: Marijo Sanes M.D.   On: 12/04/2016 12:42    Procedures Procedures (including critical care time)  CRITICAL CARE Performed by: Kinnie Feil   Total critical care time: 30 minutes  Critical care time was exclusive of separately billable procedures and treating other patients.  Critical care was necessary to treat or prevent imminent or life-threatening deterioration.  Critical care was time spent personally by me on the following activities: development of treatment plan with patient and/or surrogate as well as nursing, discussions with consultants, evaluation of patient's response to treatment, examination of patient, obtaining history from patient or surrogate, ordering and performing treatments and interventions, ordering and review of laboratory studies, ordering and review of radiographic studies, pulse oximetry and re-evaluation of patient's condition.  Medications Ordered in ED Medications  sodium chloride 0.9 % bolus 1,000 mL (0 mLs Intravenous Stopped 12/04/16 1217)     Initial Impression / Assessment and Plan / ED Course  I have reviewed the triage vital signs and the nursing notes.  Pertinent labs & imaging results that were available during my care of the patient were reviewed by me and considered in my medical decision making (see chart for details).  Clinical Course as of Dec 05 1542  Mon Dec 04, 2016  1239 Hemoglobin: (!) 9.5 [CG]  1240 B Natriuretic Peptide: (!) 1,974.7 [CG]  1241 Troponin i, poc: (!!) 0.10 [CG]  1241 Creatinine: (!) 3.41 [CG]  1241 Pulse Rate: (!) 40 [CG]  1241 BP: (!) 87/50 [CG]  1241 BP: (!) 95/49 [CG]  1250 Troponin i, poc: (!!) 0.10 [CG]  1250 IMPRESSION: CHF with small left effusion and bibasilar atelectasis  DG Chest Portable 1 View [CG]    Clinical Course User Index [CG] Kinnie Feil, PA-C   76 yo male with h/o CAD s/p CABG, ICM, systolic CHF (EF 57%), ICD, atrial fibrillation on coumadin presents to ED from wound care clinic for evaluation of low BP and HR noted at wound clinic, asymptomatic. Patient denies CP, SOB, nausea, diaphoresis. Exertional dyspnea at baseline, not worsening. Aide at bedside states patient seems weaker, hasn't eaten much over the weekend. He lives alone with aide only M-F. Was seen in ED 4 days ago for SOB, found to have acute on chronic CHF exacerbation with BNP ~3000s. He declined admission at that time left AMA with cardiologist follow up.  Per aide, baseline HR ~60 and BP ~90-100/60. Had aspirin today.   ED lab work remarkable for stable anemia. Elevated creatinine in setting of known CKD Cr 3.41, baseline appears to be ~2.5-3.  BNP 1,973 with baseline around 1000, was 3000 as of 4 days ago. EKG unchanged from previous tracing. HR paced at 40, BP has been soft in ED ~80-100/50-60. Upon re-eval, patient continues to be asymptomatic, would like to eat and drink. Discussed elevated troponin, hypotension and bradycardia with him and likely admission, he is agreeable this time.    Cardiology consulted, spoke to [place]. Pending cardiology eval and recs.   Patient, ED treatment and discharge plan was discussed with supervising physician who also evaluated the patient and is agreeable with plan.   Final Clinical Impressions(s) / ED Diagnoses   Final diagnoses:  NSTEMI (non-ST elevated myocardial infarction) ([place])   344PM:  Cardiologist recommends admission. High suspicions for digoxin toxicity. Considering sepsis causing hypotension/brady. New Prescriptions New Prescriptions   No medications on file     Arlean Hopping 12/04/16 1544    Gareth Morgan, MD 12/06/16 1300

## 2016-12-04 NOTE — ED Notes (Signed)
Pt made aware of need for urine sample. Pt stated he was unable to give urine at this time

## 2016-12-04 NOTE — ED Triage Notes (Signed)
Per ems- pt went to the wound center today to have wound care done to right foot and left forearm. While there they report he was feeling weak and having generalized weakness. EMS arrived BP 80/40, HR 40 v-paced. Given 250 NS, BP 92/48, HR 40, 94% RA, 16 RR. Pt is a x 4, denies any pain. Takes coumadin.

## 2016-12-04 NOTE — ED Notes (Signed)
Notified Dr. Billy Fischer of I stat troponin results

## 2016-12-04 NOTE — ED Notes (Signed)
Patient transported to X-ray 

## 2016-12-04 NOTE — Consult Note (Signed)
Cardiology History and Physical:   Patient ID: LEVY CEDANO; [id number]; 14-Mar-1941   Admit date: 12/04/2016 Date of Consult: 12/04/2016  Primary Care Provider: Heywood Bene, PA-C Primary Cardiologist: Dr Martinique 07/26/2016, Dr [name] 08/16/2016 Primary Electrophysiologist:  Dr Curt Bears 10/10/2016   Patient Profile:   Jason Choi is a 76 y.o. male with a hx of CABG 2002 w/ LIMA-LAD, Rad-RI, SVG-PDA, S-CHF, ICM w/ EF 30-35%, atrial fib on amio and coumadin, HTN, HLD, OA, PAH, syncope w/ ?NSVT>> CHA2DS2-VASc Score=4 (age x 1, HTN, CAD, CHF) who is being seen today for the evaluation of CHF at the request of Dr Billy Fischer.  History of Present Illness:   Last admit was 03/01-03/15/2018 for CHF w/ low output. PICC placed with initial mixed venous saturation depressed at 44%. Started on milrinone with some improvement.  Required norepinephrine for pressure support. He was diuresed fairly extensively with some rise in creatinine, though this stabilized.  Patient diuresed about 30 lbs off in the hospital. Had RHC prior to d/c that showed preserved cardiac output and normal filling pressures (mean RA 8, PA 58/22, mean PCWP 13, CI 3.56 Fick, CI 2.7 thermo). Wt 182 at 03/21 office visit.   ER visit 07/05 for elevated BNP drawn by PCP. Pt felt SOB was at baseline. Wt was 180. BUN/Cr were elevated above baseline at 81/3.13. Baseline is BUN/Cr 47/19   Mr. Hinger came to the ER today  He was referred by the wound center because of hypotension and bradycardia.  He had undergone ICD implantation may 2018 with a prior history of syncope and significant nonsustained ventricular tachycardia which I reviewed from the previous telemetry. He had a mild IVCD and was not felt to be a candidate for resynchronization. He has distant now permanent atrial fibrillation and review of the histograms had suggested a relatively stable heart rate until just the last day or 2. In this regard it is notable that  when he came to the emergency room 7/5 (as noted above) his heart rate was noted to be in the 60s and his blood pressure stable at 100-120 range.  He has few complaints today. He denies fevers or chills.  He reports that he saw Dr. Posey Pronto with renal in June. I called Dr. Posey Pronto. His creatinine was 1.9 in middle June.  Past Medical History:  Diagnosis Date  . AICD (automatic cardioverter/defibrillator) present   . Anemia    Referral to GI Feb 2013  . Arthritis    "hands primarily" (10/12/2016)  . Atrial fibrillation ([place])    ON AMIODARONE: PER VISIT NOTE IN [facility]  . CHF (congestive heart failure) ([place])   . Chronic anticoagulation    on coumadin  . Chronic kidney disease (CKD), stage II (mild)   . Gout   . High risk medication use    on amiodarone  . HTN (hypertension)   . Hypercholesteremia   . LV dysfunction    EF 35 to 40% per echo May 2012; EF remains 35 to 40% per echo Feb 2013. Referred for ICD  . Myocardial infarction ([place])   . Osteoarthritis of right shoulder region    ACUTE PAIN  . 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 Pulmonary hypertension, moderate to severe [facility])     Past Surgical History:  Procedure Laterality Date  . CARDIAC CATHETERIZATION  2009   Grafts patent  . CARDIAC CATHETERIZATION  2012   Grafts patent. EF was 25 to 30% but  35 to 40 by echo  . CARDIAC CATHETERIZATION  2003  . CARDIAC DEFIBRILLATOR PLACEMENT  10/12/2016  . CARDIOVERSION  2008--  FOR PAF   SUCCESSFUL  . CARDIOVERSION N/A 06/13/2013   Procedure: CARDIOVERSION;  Surgeon: Peter M Martinique, MD;  Location: [facility] ENDOSCOPY;  Service: Cardiovascular;  Laterality: N/A;  . CATARACT EXTRACTION, BILATERAL Bilateral   . CORONARY ARTERY BYPASS GRAFT  2002   SVG to PDA, Free radial to Intermediate, LIMA to LAD by Dr Prescott Gum  . ICD IMPLANT N/A 10/12/2016   Procedure: ICD Implant;  Surgeon: Constance Haw, MD;  Location: [place] CV LAB;  Service: Cardiovascular;  Laterality: N/A;   . KNEE ARTHROSCOPY Left 2000  . RIGHT HEART CATH N/A 08/08/2016   Procedure: Right Heart Cath;  Surgeon: Larey Dresser, MD;  Location: [place] CV LAB;  Service: Cardiovascular;  Laterality: N/A;  . Right Leg surgeries Right X 5-6   due to polio; "relocated muscles; pins in ankle"  . SHOULDER ARTHROSCOPY W/ ROTATOR CUFF REPAIR Right 05/16/2011     Inpatient Medications: Scheduled Meds:  Continuous Infusions:  PRN Meds:   Allergies:    Allergies  Allergen Reactions  . Lidocaine Other (See Comments)    Passed out - at the dentist's office  . Procaine Hcl Other (See Comments)    Passed out - at the dentist's office    Social History:   Social History   Social History  . Marital status: Widowed    Spouse name: N/A  . Number of children: 1  . Years of education: N/A   Occupational History  . 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  [employer]   Social History Main Topics  . Smoking status: Former Smoker    Packs/day: 2.00    Years: 20.00    Types: Cigarettes  . Smokeless tobacco: Never Used     Comment: 10/12/2016 "haven't had a cigarette since 1980"  . Alcohol use 7.2 oz/week    4 Shots of liquor, 8 Cans of beer per week     Comment: 10/12/2016 "couple beers and a shot of crown royal most  3-4 times/week"  . Drug use: No  . Sexual activity: No   Other Topics Concern  . Not on file   Social History Narrative   Pt lives in [place] with spouse (who is a Systems analyst).  1 grown healthy daughter who is a Insurance claims handler.       Retired 2003 from [employer].  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    Family History:   The patient's family history includes Valvular heart disease in his father. There is no history of Colon cancer, Colon polyps, Rectal cancer, or Stomach cancer.  ROS:  Please see the history of present illness.  ROS   All other ROS reviewed and negative.     Physical Exam/Data:    Vitals:   12/04/16 1306 12/04/16 1430 12/04/16 1445 12/04/16 1500  BP: (!) 90/54 (!) 95/44 (!) 97/44 (!) 95/59  Pulse: (!) 41 (!) 40 62 60  Resp:  18 (!) 23 (!) 23  Temp:      TempSrc:      SpO2: 97% (!) 84% (!) 89% 94%  Weight:      Height:        Intake/Output Summary (Last 24 hours) at 12/04/16 1507 Last data filed at 12/04/16 1217  Gross per 24 hour  Intake             1000 ml  Output                0 ml  Net             1000 ml   Filed Weights   12/04/16 1042  Weight: 180 lb (81.6 kg)   Body mass index is 22.5 kg/m.  General:  Well nourished, well developed, in no acute distress  HEENT: normal Lymph: no adenopathy Neck: no JVD Endocrine:  No thryomegaly Vascular: No carotid bruits; FA pulses 2+ bilaterally without bruits  Cardiac:  normal S1, S2; iregularly irregular   2/6 M Lungs:  clear to auscultation bilaterally, no wheezing, rhonchi or rales  Abd: soft, nontender, no hepatomegaly  Ext: no edema Musculoskeletal:  No deformities, BUE and BLE strength normal and equal Skin: warm and dry with cap refill < 1sec Neuro:  CNs 2-12 intact, no focal abnormalities noted Psych:  Normal affect   EKG:  The EKG was personally reviewed and demonstrates:  V pacing at 40 Telemetry:  Telemetry was personally reviewed and demonstrates:  Atrial fibrillation interspersed with intermittent ventricular pacing at 60  Relevant CV Studies: ECHO: 07/28/2016 - Left ventricle: The cavity size was severely dilated. Wall   thickness was normal. Systolic function was severely reduced. The   estimated ejection fraction was 15%. Diffuse hypokinesis. There   is akinesis of the inferolateral myocardium. Doppler parameters   are consistent with high ventricular filling pressure. - Mitral valve: Calcified annulus. There was severe regurgitation. - Left atrium: The atrium was severely dilated. - Right ventricle: Systolic function was mildly reduced. - Right atrium: The atrium was  moderately dilated. - Pulmonary arteries: Systolic pressure was moderately increased.   PA peak pressure: 49 mm Hg (S). Impressions: - Definity used; severe LV dysfunction; severe LVE; elevated LV   filling pressure; biatrial enlargement; severe MR; mildly reduced   RV function; mild TR with moderately elevated pulmonary pressure.  RHC Procedural Findings: 08/08/2016 Hemodynamics (mmHg) RA mean 8 RV 53/7 PA 58/22, mean 33 PCWP mean 13  Oxygen saturations: PA 66% AO 94%  Cardiac Output (Fick) 7.73  Cardiac Index (Fick) 3.56  PVR 2.6 WU  Cardiac Output (Thermo) 5.86 Cardiac Index (Thermo) 2.70 PVR 3.4 WU   Laboratory Data:  Chemistry Recent Labs Lab 11/30/16 1039 12/04/16 1128  NA 141 141  K 5.1 5.0  CL 107 108  CO2 23 24  GLUCOSE 95 105*  BUN 81* 79*  CREATININE 3.13* 3.41*  CALCIUM 9.4 8.9  GFRNONAA 18* 16*  GFRAA 21* 19*  ANIONGAP 11 9     Recent Labs Lab 12/04/16 1128  PROT 5.3*  ALBUMIN 2.7*  AST 73*  ALT 55  ALKPHOS 88  BILITOT 1.0   Hematology Recent Labs Lab 11/30/16 1039 12/04/16 1128  WBC 7.5 8.8  RBC 3.40* 3.40*  HGB 9.8* 9.5*  HCT 32.3* 31.5*  MCV 95.0 92.6  MCH 28.8 27.9  MCHC 30.3 30.2  RDW 18.5* 18.3*  PLT 188 152   Cardiac EnzymesNo results for input(s): TROPONINI in the last 168 hours.  Recent Labs Lab 11/30/16 1051 12/04/16 1152  TROPIPOC 0.06 0.10*    BNP Recent Labs Lab 12/04/16 1128  BNP 1,974.7*    DDimer No results for input(s): DDIMER in the last 168 hours.  Radiology/Studies:  Dg Chest Portable 1 View  Result Date: 12/04/2016 CLINICAL DATA:  Hypotension. EXAM: PORTABLE CHEST 1 VIEW COMPARISON:  11/30/2016 FINDINGS: The heart is enlarged but appears stable. Prominent mediastinal and hilar contours are unchanged. Stable surgical changes from bypass surgery. The right ventricular pacer wires in stable position. There is central vascular congestion and mild interstitial pulmonary edema suggesting CHF. Small  left pleural effusion is likely. Bibasilar atelectasis. IMPRESSION: CHF with small left effusion and bibasilar atelectasis. Electronically Signed   By: Marijo Sanes M.D.   On: 12/04/2016 12:42    Assessment and Plan:   1. 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 Atrial fibrillation-permanent 3. Bradycardia with ensuing ventricular pacing 4. Implantable single-chamber ICD Medtronic 5. Acute on chronic renal insufficiency 6. Hypotension 7. Syncope-history 8. Ventricular tachycardia-nonsustained  The patient presents profoundly bradycardic relatively acutely this last week with hypotension which may be aggravated by ventricular pacing; this was not notable last week.  There is also significant worsening of renal function over the last 3 weeks already present last week. He is warm to touch suggesting that he is not in cardiogenic shock. I worry about sepsis giving rise to the hypotension and the worsening renal function.  I'm concerned that he may be dig toxic and is rapidly deteriorating renal function and that this is further aggravating his bradycardia resulting in ventricular pacing and secondary worsening cardiac function   I have spoken with Dr. Posey Pronto. He has not quite sure as to the cause of his acute renal failure but hopes that is related to hypotension and will improve with discontinuation of his diuretics.    I spoke with Dr. Martinique who is concerned about cardiorenal syndrome. As noted above, I am less so given the fact that he is warm to touch   I spoke with the heart failure service. Again, I'm not sure about his need for inotropes given the fact that he is warm.  Recommendations 1-  pace at a rate of 80 to improve cardiac output. This is a short term option 2. Digoxin level-stat suspect he will be dig toxic and will tentatively Digibind 3. Discontinue Aldactone, metoprolol, dig, and furosemide. 4. I don't see a good indication for aspirin; I will discontinue it also 5. Blood  cultures and urine cultures and lactic acid level. In the event that it is supportive of possible sepsis may need to initiate his sepsis protocol      I am concerned that he may be.  Jonetta Speak, PA-C  12/04/2016 3:07 PM

## 2016-12-04 NOTE — ED Notes (Signed)
RN attempt to interrogate medtronic device. Our interrogator was unable to get signal from ICD device. Fish farm manager.

## 2016-12-04 NOTE — Progress Notes (Signed)
    I received a page from pharmacy regarding the [place].  The hospital does not have this medication on hand but it is being transferred from an outside facility. There is concern from pharmacy that other sources are causing patients symptoms and the Digoxin level of 2.6 is not likely the source the patients symptoms Wille Glaser, PharmD [phone]). He said that there are no clear numerical parameters for when to give this medication.  I discussed the case with Dr. Domenic Polite, he reviewed the patients chart. The pulse has improved to 80's but the blood pressure remains low-- he would recommend continue to give the medication as planned.  I have ordered a repeat Digoxin level for in the morning.

## 2016-12-04 NOTE — Telephone Encounter (Signed)
Left pt message to make f/u appt w/ [facility]

## 2016-12-05 ENCOUNTER — Ambulatory Visit: Payer: Medicare Other | Admitting: Cardiology

## 2016-12-05 ENCOUNTER — Encounter: Payer: Self-pay | Admitting: *Deleted

## 2016-12-05 DIAGNOSIS — I5043 Acute on chronic combined systolic (congestive) and diastolic (congestive) heart failure: Secondary | ICD-10-CM

## 2016-12-05 LAB — BASIC METABOLIC PANEL
ANION GAP: 13 (ref 5–15)
ANION GAP: 11 (ref 5–15)
BUN: 77 mg/dL — ABNORMAL HIGH (ref 6–20)
BUN: 72 mg/dL — ABNORMAL HIGH (ref 6–20)
CALCIUM: 9 mg/dL (ref 8.9–10.3)
CO2: 21 mmol/L — ABNORMAL LOW (ref 22–32)
CO2: 23 mmol/L (ref 22–32)
Calcium: 9.1 mg/dL (ref 8.9–10.3)
Chloride: 107 mmol/L (ref 101–111)
Chloride: 105 mmol/L (ref 101–111)
Creatinine, Ser: 3.1 mg/dL — ABNORMAL HIGH (ref 0.61–1.24)
Creatinine, Ser: 2.87 mg/dL — ABNORMAL HIGH (ref 0.61–1.24)
GFR calc Af Amer: 21 mL/min — ABNORMAL LOW (ref >60)
GFR calc Af Amer: 23 mL/min — ABNORMAL LOW (ref >60)
GFR, EST NON AFRICAN AMERICAN: 18 mL/min — AB (ref >60)
GFR, EST NON AFRICAN AMERICAN: 20 mL/min — AB (ref >60)
Glucose, Bld: 109 mg/dL — ABNORMAL HIGH (ref 65–99)
Glucose, Bld: 110 mg/dL — ABNORMAL HIGH (ref 65–99)
POTASSIUM: 4.3 mmol/L (ref 3.5–5.1)
POTASSIUM: 4.4 mmol/L (ref 3.5–5.1)
SODIUM: 141 mmol/L (ref 135–145)
SODIUM: 139 mmol/L (ref 135–145)

## 2016-12-05 LAB — DIGOXIN LEVEL: DIGOXIN LVL: 1.9 ng/mL (ref 0.8–2.0)

## 2016-12-05 LAB — PROTIME-INR
INR: 2.61
PROTHROMBIN TIME: 28.5 s — AB (ref 11.4–15.2)

## 2016-12-05 MED ORDER — WARFARIN SODIUM 3 MG PO TABS
6.0000 mg | ORAL_TABLET | Freq: Once | ORAL | Status: AC
  Administered 2016-12-05: 6 mg via ORAL
  Filled 2016-12-05: qty 2

## 2016-12-05 MED ORDER — FUROSEMIDE 10 MG/ML IJ SOLN
80.0000 mg | Freq: Two times a day (BID) | INTRAMUSCULAR | Status: DC
  Administered 2016-12-05 – 2016-12-06 (×3): 80 mg via INTRAVENOUS
  Filled 2016-12-05 (×3): qty 8

## 2016-12-05 MED ORDER — METOLAZONE 5 MG PO TABS
5.0000 mg | ORAL_TABLET | Freq: Once | ORAL | Status: AC
Start: 2016-12-05 — End: 2016-12-05
  Administered 2016-12-05: 5 mg via ORAL
  Filled 2016-12-05: qty 1

## 2016-12-05 NOTE — Progress Notes (Signed)
Heart failure team requested condom catheter to be placed for accurate intake and output. Will place once pt finishes breakfast   Jason Choi Leory Plowman

## 2016-12-05 NOTE — Progress Notes (Signed)
Infection control called and requested pt be placed on contact isolation   Kerri Asche
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
## 2016-12-05 NOTE — Progress Notes (Signed)
Patients SPO2 on RA 91% placed pt on 2l/min nasal cannula. SPO2 96 on 2L/min. Will continue to monitor.  Carroll Lingelbach, RN

## 2016-12-05 NOTE — Progress Notes (Signed)
Patient resting well. No acute distress noted with no complaints at this time. Bed alarm on with door open for visibility.

## 2016-12-05 NOTE — Progress Notes (Signed)
Pt transferred to high low bed   Jason Choi

## 2016-12-05 NOTE — Progress Notes (Addendum)
[facility] for Warfarin Indication: atrial fibrillation  Allergies  Allergen Reactions  . Lidocaine Other (See Comments)    Passed out - at the dentist's office  . Procaine Hcl Other (See Comments)    Passed out - at the dentist's office    Patient Measurements: Height: 6\' 3"  (190.5 cm) Weight: 167 lb 11.2 oz (76.1 kg) IBW/kg (Calculated) : 84.5  Vital Signs: Temp: 97.6 F (36.4 C) (07/10 0532) Temp Source: Oral (07/10 0532) BP: 102/50 (07/10 0946) Pulse Rate: 79 (07/10 0946)  Labs:  Recent Labs  12/04/16 1128 12/05/16 0615  HGB 9.5*  --   HCT 31.5*  --   PLT 152  --   LABPROT 30.8* 28.5*  INR 2.89 2.61  CREATININE 3.41* 3.10*    Estimated Creatinine Clearance: 22.5 mL/min (A) (by C-G formula based on SCr of 3.1 mg/dL (H)).   Medical History: Past Medical History:  Diagnosis Date  . AICD (automatic cardioverter/defibrillator) present   . Anemia    Referral to GI Feb 2013  . Arthritis    "hands primarily" (10/12/2016)  . Atrial fibrillation ([place])    ON AMIODARONE: PER VISIT NOTE IN [facility] HB  . CHF (congestive heart failure) ([place])   . Chronic anticoagulation    on coumadin  . Chronic kidney disease (CKD), stage II (mild)   . Gout   . High risk medication use    on amiodarone  . HTN (hypertension)   . Hypercholesteremia   . LV dysfunction    EF 35 to 40% per echo May 2012; EF remains 35 to 40% per echo Feb 2013. Referred for ICD  . Myocardial infarction ([place])   . Osteoarthritis of right shoulder region    ACUTE PAIN  . 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 Pulmonary hypertension, moderate to severe ([facility])     Medications:  Warfarin 6mg  po daily except 3mg  on Saturday  Assessment: 76 year old male on chronic Warfarin for atrial fibrillation here with weakness and hypotension. Found to be in AKI with SCr >3 and Digoxin level 2.6. INR toon admission is 2.89  INR down to 2.6 today, dose not charted  last night, will give 6mg  tonight.   Digoxin level down to 1.9, digifab not needed last night.  Threatening to leave AMA.   Goal of Therapy:  INR 2-3 Monitor platelets by anticoagulation protocol: Yes   Plan:  Warfarin 6 mg po x1 tonight. Daily PT/INR  Erin Hearing PharmD., BCPS Clinical Pharmacist Pager [phone] 12/05/2016 11:26 AM

## 2016-12-06 ENCOUNTER — Other Ambulatory Visit: Payer: Self-pay

## 2016-12-06 DIAGNOSIS — I482 Chronic atrial fibrillation: Secondary | ICD-10-CM

## 2016-12-06 LAB — BASIC METABOLIC PANEL
Anion gap: 8 (ref 5–15)
BUN: 67 mg/dL — ABNORMAL HIGH (ref 6–20)
CALCIUM: 9.2 mg/dL (ref 8.9–10.3)
CO2: 28 mmol/L (ref 22–32)
CREATININE: 2.74 mg/dL — AB (ref 0.61–1.24)
Chloride: 104 mmol/L (ref 101–111)
GFR calc Af Amer: 25 mL/min — ABNORMAL LOW (ref >60)
GFR calc non Af Amer: 21 mL/min — ABNORMAL LOW (ref >60)
GLUCOSE: 84 mg/dL (ref 65–99)
Potassium: 4.2 mmol/L (ref 3.5–5.1)
Sodium: 140 mmol/L (ref 135–145)

## 2016-12-06 LAB — PROTIME-INR
INR: 2.41
Prothrombin Time: 26.7 seconds — ABNORMAL HIGH (ref 11.4–15.2)

## 2016-12-06 MED ORDER — NITROGLYCERIN 0.4 MG SL SUBL: 0.4000 mg | SUBLINGUAL_TABLET | SUBLINGUAL | 2 refills | Status: DC | PRN

## 2016-12-06 NOTE — Progress Notes (Signed)
Pt caregiver at bedside expressing concern for discharge in regards to home health care order. Informed case manager, case manager at bedside now   [employer]

## 2016-12-06 NOTE — Progress Notes (Signed)
Received call from [facility] with [facility]); patient is active with them for [place] services as [facility] [phone]

## 2016-12-06 NOTE — Progress Notes (Signed)
Pt discharge education provided to pt and pt caregiver. Pt has all belongings. Pt IV discontinued, catheter intact and telemetry removed. Pt discharged via wheelchair with volunteer.   Jason Choi
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
## 2016-12-06 NOTE — Progress Notes (Signed)
Stopped PA in hallway. She stated no EKG needed. Give pt IV lasix dose prior to discharge. PA aware of pt low blood pressures. PA stated pt safe for discharge  Ragan Duhon Leory Plowman

## 2016-12-06 NOTE — Progress Notes (Addendum)
Advanced Heart Failure Rounding Note  PCP: Heywood Bene, PA-C Primary Cardiologist: Dr Martinique, Dr [name]   Subjective:    Admitted 12/04/16 with volume overload. Started on IV lasix and metolazone. Weight down 2 pounds.   Creatinine 3.41->3.10->2.87->2.74. Denies SOB, breathing ok. Denies chest pain. Wants to go home.     Objective:   Weight Range: 167 lb 11.2 oz (76.1 kg) Body mass index is 20.96 kg/m.   Vital Signs:   Temp:  [97.8 F (36.6 C)-98.2 F (36.8 C)] 97.8 F (36.6 C) (07/11 0628) Pulse Rate:  [79-91] 80 (07/11 0628) Resp:  [18] 18 (07/11 0628) BP: (87-102)/(48-55) 89/49 (07/11 0628) SpO2:  [91 %-99 %] 98 % (07/11 0628) Last BM Date: 12/04/16  Weight change: Filed Weights   12/04/16 1042 12/04/16 1841 12/05/16 0532  Weight: 180 lb (81.6 kg) 169 lb 9.6 oz (76.9 kg) 167 lb 11.2 oz (76.1 kg)    Intake/Output:   Intake/Output Summary (Last 24 hours) at 12/06/16 0717 Last data filed at 12/06/16 0200  Gross per 24 hour  Intake              700 ml  Output             1600 ml  Net             -900 ml      Physical Exam    General: Elderly male, NAD. Lying in bed.  HEENT: Normal Neck: Supple. 8-9 cm JVP. Carotids 2+ bilat; no bruits. No lymphadenopathy or thyromegaly appreciated. Cor: PMI nondisplaced. Regular rate & rhythm. No rubs, gallops or murmurs. Lungs: Clear Abdomen: Soft, nontender, nondistended. No hepatosplenomegaly. No bruits or masses. Good bowel sounds. Extremities: No cyanosis, clubbing, rash, edema Neuro: Alert & orientedx3, cranial nerves grossly intact. moves all 4 extremities w/o difficulty. Affect pleasant   Telemetry   V paced.   EKG    V paced.   Labs    CBC  Recent Labs  12/04/16 1128  WBC 8.8  NEUTROABS 6.0  HGB 9.5*  HCT 31.5*  MCV 92.6  PLT 782   Basic Metabolic Panel  Recent Labs  12/05/16 1613 12/06/16 0512  NA 139 140  K 4.4 4.2  CL 105 104  CO2 23 28  GLUCOSE 110* 84  BUN 72* 67*    CREATININE 2.87* 2.74*  CALCIUM 9.1 9.2   Liver Function Tests  Recent Labs  12/04/16 1128  AST 73*  ALT 55  ALKPHOS 88  BILITOT 1.0  PROT 5.3*  ALBUMIN 2.7*   No results for input(s): LIPASE, AMYLASE in the last 72 hours. Cardiac Enzymes No results for input(s): CKTOTAL, CKMB, CKMBINDEX, TROPONINI in the last 72 hours.  BNP: BNP (last 3 results)  Recent Labs  08/30/16 1141 09/20/16 1356 12/04/16 1128  BNP 473.6* 1,989.5* 1,974.7*    ProBNP (last 3 results) No results for input(s): PROBNP in the last 8760 hours.   D-Dimer No results for input(s): DDIMER in the last 72 hours. Hemoglobin A1C No results for input(s): HGBA1C in the last 72 hours. Fasting Lipid Panel No results for input(s): CHOL, HDL, LDLCALC, TRIG, CHOLHDL, LDLDIRECT in the last 72 hours. Thyroid Function Tests No results for input(s): TSH, T4TOTAL, T3FREE, THYROIDAB in the last 72 hours.  Invalid input(s): FREET3  Other results:   Imaging   Transthoracic Echocardiography 07/28/16 Study Conclusions  - Left ventricle: The cavity size was severely dilated. Wall   thickness was normal. Systolic function was  severely reduced. The   estimated ejection fraction was 15%. Diffuse hypokinesis. There   is akinesis of the inferolateral myocardium. Doppler parameters   are consistent with high ventricular filling pressure. - Mitral valve: Calcified annulus. There was severe regurgitation. - Left atrium: The atrium was severely dilated. - Right ventricle: Systolic function was mildly reduced. - Right atrium: The atrium was moderately dilated. - Pulmonary arteries: Systolic pressure was moderately increased.   PA peak pressure: 49 mm Hg (S).  Impressions:  - Definity used; severe LV dysfunction; severe LVE; elevated LV   filling pressure; biatrial enlargement; severe Jason; mildly reduced   RV function; mild TR with moderately elevated pulmonary pressure.    Medications:     Scheduled  Medications: . allopurinol  100 mg Oral Daily  . atorvastatin  40 mg Oral q morning - 10a  . furosemide  80 mg Intravenous BID  . multivitamin with minerals  1 tablet Oral Daily  . sodium chloride flush  3 mL Intravenous Q12H  . Warfarin - Pharmacist Dosing Inpatient   Does not apply q1800     Infusions: . sodium chloride       PRN Medications:  sodium chloride, acetaminophen, ALPRAZolam, nitroGLYCERIN, ondansetron (ZOFRAN) IV, sodium chloride flush, zolpidem    Patient Profile   Jason Choi is a 76 year old male with a past medical history of CAD s/p CABG in 2002, ICM, chronic systolic CHF with recent fall in EF from 30-35% where he had been chronically to 15% in March 2018. Also with history of permanent Afib, NSVT s/p ICD placement.   Assessment/Plan  1. Acute on chronic systolic CHF: Echo 01/3902 EF 15%, RV function mildly reduced. Mixed ICM and NICM with Afib and ETOH use.  - NYHA III - Diuresis sluggish, weight down 2 pounds.  - Continue IV lasix and metolazone today.  - Continue to hold metoprolol. - BP too soft for any HF meds.   2. Acute on chronic CKD stage III - baseline creatinine 2.2-2.5.  - Improved today. Creatinine 2.74  3. Elevated digoxin level  - Stop digoxin.  - No change.   4. Permanent atrial fibrillation  - This patients CHA2DS2-VASc Score and unadjusted Ischemic Stroke Rate (% per year) is equal to 4.8 % stroke rate/year from a score of 4 Above score calculated as 1 point each if present [CHF, HTN, DM, Vascular=MI/PAD/Aortic Plaque, Age if 65-74, or Male], 2 points each if present [Age > 75, or Stroke/TIA/TE] - Continue warfarin for anticoagulation - INR 2.41.   5. History of NSVT - s/p ICD - No further   6. Hypotension - As above, no ACE/ARB with hypotension.   7. ETOH  - Encouraged cessation.  - No change.   He is adamant that he is leaving the hospital. Will set up close follow up.   Length of Stay: [place], NP    12/06/2016, 7:17 AM  Advanced Heart Failure Team Pager [phone] (M-F; [place])  Please contact [facility] for night-coverage after hours (4p -7a ) and weekends on [email or web address]  Patient seen and examined with Jettie Booze, NP. We discussed all aspects of the encounter. I agree with the assessment and plan as stated above.   Volume status mildly improved. (weight down 4 pounds). He is severely depressed and  adamant that he is going home today. Will plan d/c today and have him f/u in HF clinic. INR therpeautic. Stop digoxin.   Glori Bickers, MD  9:23 AM

## 2016-12-08 ENCOUNTER — Other Ambulatory Visit: Payer: Self-pay | Admitting: Adult Health

## 2016-12-09 LAB — CULTURE, BLOOD (ROUTINE X 2)
CULTURE: NO GROWTH
SPECIAL REQUESTS: ADEQUATE

## 2016-12-10 LAB — CULTURE, BLOOD (ROUTINE X 2)
Culture: NO GROWTH
Special Requests: ADEQUATE

## 2016-12-11 DIAGNOSIS — T68XXXA Hypothermia, initial encounter: Secondary | ICD-10-CM | POA: Diagnosis not present

## 2016-12-11 DIAGNOSIS — Z95810 Presence of automatic (implantable) cardiac defibrillator: Secondary | ICD-10-CM | POA: Diagnosis not present

## 2016-12-11 DIAGNOSIS — S51012A Laceration without foreign body of left elbow, initial encounter: Secondary | ICD-10-CM | POA: Diagnosis not present

## 2016-12-11 DIAGNOSIS — I110 Hypertensive heart disease with heart failure: Secondary | ICD-10-CM | POA: Diagnosis not present

## 2016-12-11 DIAGNOSIS — Z8612 Personal history of poliomyelitis: Secondary | ICD-10-CM | POA: Diagnosis not present

## 2016-12-11 DIAGNOSIS — I2510 Atherosclerotic heart disease of native coronary artery without angina pectoris: Secondary | ICD-10-CM | POA: Diagnosis not present

## 2016-12-11 DIAGNOSIS — I252 Old myocardial infarction: Secondary | ICD-10-CM | POA: Diagnosis not present

## 2016-12-11 DIAGNOSIS — I428 Other cardiomyopathies: Secondary | ICD-10-CM | POA: Diagnosis not present

## 2016-12-11 DIAGNOSIS — W19XXXA Unspecified fall, initial encounter: Secondary | ICD-10-CM | POA: Diagnosis not present

## 2016-12-11 DIAGNOSIS — X31XXXA Exposure to excessive natural cold, initial encounter: Secondary | ICD-10-CM | POA: Diagnosis not present

## 2016-12-11 DIAGNOSIS — L97312 Non-pressure chronic ulcer of right ankle with fat layer exposed: Secondary | ICD-10-CM | POA: Diagnosis not present

## 2016-12-11 DIAGNOSIS — S51812A Laceration without foreign body of left forearm, initial encounter: Secondary | ICD-10-CM | POA: Diagnosis not present

## 2016-12-11 DIAGNOSIS — I509 Heart failure, unspecified: Secondary | ICD-10-CM | POA: Diagnosis not present

## 2016-12-11 DIAGNOSIS — I739 Peripheral vascular disease, unspecified: Secondary | ICD-10-CM | POA: Diagnosis not present

## 2016-12-11 DIAGNOSIS — I87311 Chronic venous hypertension (idiopathic) with ulcer of right lower extremity: Secondary | ICD-10-CM | POA: Diagnosis not present

## 2016-12-12 ENCOUNTER — Telehealth: Payer: Self-pay | Admitting: Cardiology

## 2016-12-12 NOTE — Telephone Encounter (Signed)
OK for PT order.  Peter Martinique MD, [facility]

## 2016-12-12 NOTE — Telephone Encounter (Signed)
Left detailed message for [facility]

## 2016-12-12 NOTE — Telephone Encounter (Signed)
She needs a verbal order for Physical Therapy for increased weakness.
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
## 2016-12-14 ENCOUNTER — Ambulatory Visit (HOSPITAL_COMMUNITY)
Admit: 2016-12-14 | Discharge: 2016-12-14 | Disposition: A | Discharge status: Home / Self Care | Payer: Medicare Other | Attending: Cardiology | Admitting: Cardiology

## 2016-12-14 ENCOUNTER — Ambulatory Visit (INDEPENDENT_AMBULATORY_CARE_PROVIDER_SITE_OTHER): Payer: Medicare Other | Admitting: Pharmacist

## 2016-12-14 ENCOUNTER — Telehealth (HOSPITAL_COMMUNITY): Payer: Self-pay

## 2016-12-14 VITALS — BP 110/62 | HR 88 | Wt 163.0 lb

## 2016-12-14 DIAGNOSIS — F1010 Alcohol abuse, uncomplicated: Secondary | ICD-10-CM | POA: Diagnosis not present

## 2016-12-14 DIAGNOSIS — N183 Chronic kidney disease, stage 3 unspecified: Secondary | ICD-10-CM

## 2016-12-14 DIAGNOSIS — I5022 Chronic systolic (congestive) heart failure: Secondary | ICD-10-CM | POA: Diagnosis not present

## 2016-12-14 DIAGNOSIS — I255 Ischemic cardiomyopathy: Secondary | ICD-10-CM | POA: Diagnosis not present

## 2016-12-14 DIAGNOSIS — Z87891 Personal history of nicotine dependence: Secondary | ICD-10-CM | POA: Diagnosis not present

## 2016-12-14 DIAGNOSIS — Z7901 Long term (current) use of anticoagulants: Secondary | ICD-10-CM

## 2016-12-14 DIAGNOSIS — E785 Hyperlipidemia, unspecified: Secondary | ICD-10-CM | POA: Diagnosis not present

## 2016-12-14 DIAGNOSIS — M109 Gout, unspecified: Secondary | ICD-10-CM | POA: Diagnosis not present

## 2016-12-14 DIAGNOSIS — I482 Chronic atrial fibrillation: Secondary | ICD-10-CM | POA: Diagnosis present

## 2016-12-14 DIAGNOSIS — R55 Syncope and collapse: Secondary | ICD-10-CM | POA: Insufficient documentation

## 2016-12-14 DIAGNOSIS — Z5181 Encounter for therapeutic drug level monitoring: Secondary | ICD-10-CM

## 2016-12-14 DIAGNOSIS — I130 Hypertensive heart and chronic kidney disease with heart failure and stage 1 through stage 4 chronic kidney disease, or unspecified chronic kidney disease: Secondary | ICD-10-CM | POA: Insufficient documentation

## 2016-12-14 DIAGNOSIS — Z951 Presence of aortocoronary bypass graft: Secondary | ICD-10-CM | POA: Diagnosis not present

## 2016-12-14 DIAGNOSIS — Z95810 Presence of automatic (implantable) cardiac defibrillator: Secondary | ICD-10-CM

## 2016-12-14 DIAGNOSIS — L97519 Non-pressure chronic ulcer of other part of right foot with unspecified severity: Secondary | ICD-10-CM | POA: Insufficient documentation

## 2016-12-14 DIAGNOSIS — I4821 Permanent atrial fibrillation: Secondary | ICD-10-CM

## 2016-12-14 DIAGNOSIS — M1990 Unspecified osteoarthritis, unspecified site: Secondary | ICD-10-CM | POA: Diagnosis not present

## 2016-12-14 DIAGNOSIS — I2510 Atherosclerotic heart disease of native coronary artery without angina pectoris: Secondary | ICD-10-CM | POA: Insufficient documentation

## 2016-12-14 LAB — BASIC METABOLIC PANEL
ANION GAP: 10 (ref 5–15)
BUN: 62 mg/dL — ABNORMAL HIGH (ref 6–20)
CALCIUM: 9.5 mg/dL (ref 8.9–10.3)
CO2: 32 mmol/L (ref 22–32)
Chloride: 95 mmol/L — ABNORMAL LOW (ref 101–111)
Creatinine, Ser: 2.3 mg/dL — ABNORMAL HIGH (ref 0.61–1.24)
GFR, EST AFRICAN AMERICAN: 30 mL/min — AB (ref >60)
GFR, EST NON AFRICAN AMERICAN: 26 mL/min — AB (ref >60)
GLUCOSE: 148 mg/dL — AB (ref 65–99)
POTASSIUM: 4.2 mmol/L (ref 3.5–5.1)
SODIUM: 137 mmol/L (ref 135–145)

## 2016-12-14 LAB — TSH: TSH: 2.289 u[IU]/mL (ref 0.350–4.500)

## 2016-12-14 LAB — BRAIN NATRIURETIC PEPTIDE: B NATRIURETIC PEPTIDE 5: 1024 pg/mL — AB (ref 0.0–100.0)

## 2016-12-14 LAB — CBC
HEMATOCRIT: 35.1 % — AB (ref 39.0–52.0)
HEMOGLOBIN: 11 g/dL — AB (ref 13.0–17.0)
MCH: 28.1 pg (ref 26.0–34.0)
MCHC: 31.3 g/dL (ref 30.0–36.0)
MCV: 89.8 fL (ref 78.0–100.0)
Platelets: 224 10*3/uL (ref 150–400)
RBC: 3.91 MIL/uL — ABNORMAL LOW (ref 4.22–5.81)
RDW: 18.5 % — ABNORMAL HIGH (ref 11.5–15.5)
WBC: 10.1 10*3/uL (ref 4.0–10.5)

## 2016-12-14 LAB — PROTIME-INR: INR: 1.8 — AB (ref <=1.1)

## 2016-12-14 MED ORDER — TORSEMIDE 20 MG PO TABS: 40.0000 mg | ORAL_TABLET | Freq: Every day | ORAL | 3 refills | Status: DC

## 2016-12-14 NOTE — Telephone Encounter (Signed)
Received call from patient's [place] with many concerns about patient. States he has lost 20 lbs, drinks heavily (recently lost wife in February) and frequents the [facility] stores and bars, seems very depressed and thinks he jokes and drinks to self medicate, intermittent confusion, has not had BM in a week, worrying about possible cirrhosis. Will let provider know HHRN concerns and will also make CHF SW [name] aware to see if she can meet with patient as he comes in this afternoon for post hospital visit. [place]  Renee Pain, RN
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
## 2016-12-14 NOTE — Patient Instructions (Signed)
Decrease Torsemide to 40 mg daily  Labs today  Your physician recommends that you schedule a follow-up appointment in: 2 weeks

## 2016-12-18 ENCOUNTER — Telehealth: Payer: Self-pay | Admitting: Licensed Clinical Social Worker
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
## 2016-12-19 ENCOUNTER — Telehealth: Payer: Self-pay | Admitting: Cardiology

## 2016-12-19 NOTE — Telephone Encounter (Signed)
New message     They need verbal order 1 time a week for 1 week  , and 2 times a week for 3 week , physical therapy

## 2016-12-19 NOTE — Telephone Encounter (Signed)
Ok to give verbal order for PT.  Vonnie Ligman Martinique MD, [facility]

## 2016-12-19 NOTE — Telephone Encounter (Signed)
Spoke to [place] [facility] , let her know Dr. Martinique will be in the office tomorrow.

## 2016-12-19 NOTE — Telephone Encounter (Signed)
Left detailed message with verbal ok for PT as requested

## 2016-12-21 ENCOUNTER — Inpatient Hospital Stay (HOSPITAL_COMMUNITY): Payer: Medicare Other

## 2016-12-21 ENCOUNTER — Ambulatory Visit (INDEPENDENT_AMBULATORY_CARE_PROVIDER_SITE_OTHER): Payer: Medicare Other | Admitting: Pharmacist Clinician (PhC)/ Clinical Pharmacy Specialist

## 2016-12-21 ENCOUNTER — Encounter (HOSPITAL_COMMUNITY): Payer: Self-pay | Admitting: Emergency Medicine

## 2016-12-21 ENCOUNTER — Inpatient Hospital Stay (HOSPITAL_COMMUNITY)
Admission: EM | Admit: 2016-12-21 | Discharge: 2016-12-27 | DRG: 291 | Disposition: A | Discharge status: Home Health | Payer: Medicare Other | Attending: Cardiology | Admitting: Cardiology

## 2016-12-21 ENCOUNTER — Emergency Department (HOSPITAL_COMMUNITY): Payer: Medicare Other

## 2016-12-21 DIAGNOSIS — Z8249 Family history of ischemic heart disease and other diseases of the circulatory system: Secondary | ICD-10-CM

## 2016-12-21 DIAGNOSIS — M19041 Primary osteoarthritis, right hand: Secondary | ICD-10-CM | POA: Diagnosis present

## 2016-12-21 DIAGNOSIS — I4821 Permanent atrial fibrillation: Secondary | ICD-10-CM

## 2016-12-21 DIAGNOSIS — I272 Pulmonary hypertension, unspecified: Secondary | ICD-10-CM | POA: Diagnosis present

## 2016-12-21 DIAGNOSIS — E7800 Pure hypercholesterolemia, unspecified: Secondary | ICD-10-CM | POA: Diagnosis present

## 2016-12-21 DIAGNOSIS — I255 Ischemic cardiomyopathy: Secondary | ICD-10-CM | POA: Diagnosis present

## 2016-12-21 DIAGNOSIS — I2510 Atherosclerotic heart disease of native coronary artery without angina pectoris: Secondary | ICD-10-CM | POA: Diagnosis present

## 2016-12-21 DIAGNOSIS — N184 Chronic kidney disease, stage 4 (severe): Secondary | ICD-10-CM | POA: Diagnosis present

## 2016-12-21 DIAGNOSIS — E43 Unspecified severe protein-calorie malnutrition: Secondary | ICD-10-CM | POA: Diagnosis present

## 2016-12-21 DIAGNOSIS — J9601 Acute respiratory failure with hypoxia: Secondary | ICD-10-CM | POA: Diagnosis present

## 2016-12-21 DIAGNOSIS — I482 Chronic atrial fibrillation: Secondary | ICD-10-CM | POA: Diagnosis not present

## 2016-12-21 DIAGNOSIS — Z95810 Presence of automatic (implantable) cardiac defibrillator: Secondary | ICD-10-CM | POA: Diagnosis not present

## 2016-12-21 DIAGNOSIS — Z5181 Encounter for therapeutic drug level monitoring: Secondary | ICD-10-CM

## 2016-12-21 DIAGNOSIS — I248 Other forms of acute ischemic heart disease: Secondary | ICD-10-CM | POA: Diagnosis present

## 2016-12-21 DIAGNOSIS — R0602 Shortness of breath: Secondary | ICD-10-CM | POA: Diagnosis present

## 2016-12-21 DIAGNOSIS — M1A9XX1 Chronic gout, unspecified, with tophus (tophi): Secondary | ICD-10-CM | POA: Diagnosis present

## 2016-12-21 DIAGNOSIS — I130 Hypertensive heart and chronic kidney disease with heart failure and stage 1 through stage 4 chronic kidney disease, or unspecified chronic kidney disease: Secondary | ICD-10-CM | POA: Diagnosis present

## 2016-12-21 DIAGNOSIS — M19011 Primary osteoarthritis, right shoulder: Secondary | ICD-10-CM | POA: Diagnosis present

## 2016-12-21 DIAGNOSIS — I5084 End stage heart failure: Secondary | ICD-10-CM | POA: Diagnosis present

## 2016-12-21 DIAGNOSIS — R531 Weakness: Secondary | ICD-10-CM | POA: Diagnosis not present

## 2016-12-21 DIAGNOSIS — I5023 Acute on chronic systolic (congestive) heart failure: Secondary | ICD-10-CM | POA: Diagnosis not present

## 2016-12-21 DIAGNOSIS — Z9842 Cataract extraction status, left eye: Secondary | ICD-10-CM | POA: Diagnosis not present

## 2016-12-21 DIAGNOSIS — Z7901 Long term (current) use of anticoagulants: Secondary | ICD-10-CM

## 2016-12-21 DIAGNOSIS — Z9841 Cataract extraction status, right eye: Secondary | ICD-10-CM

## 2016-12-21 DIAGNOSIS — I340 Nonrheumatic mitral (valve) insufficiency: Secondary | ICD-10-CM | POA: Diagnosis present

## 2016-12-21 DIAGNOSIS — Z884 Allergy status to anesthetic agent status: Secondary | ICD-10-CM

## 2016-12-21 DIAGNOSIS — L97519 Non-pressure chronic ulcer of other part of right foot with unspecified severity: Secondary | ICD-10-CM | POA: Diagnosis present

## 2016-12-21 DIAGNOSIS — I480 Paroxysmal atrial fibrillation: Secondary | ICD-10-CM | POA: Diagnosis present

## 2016-12-21 DIAGNOSIS — Z951 Presence of aortocoronary bypass graft: Secondary | ICD-10-CM

## 2016-12-21 DIAGNOSIS — F419 Anxiety disorder, unspecified: Secondary | ICD-10-CM | POA: Diagnosis present

## 2016-12-21 DIAGNOSIS — Z87891 Personal history of nicotine dependence: Secondary | ICD-10-CM | POA: Diagnosis not present

## 2016-12-21 DIAGNOSIS — F1010 Alcohol abuse, uncomplicated: Secondary | ICD-10-CM | POA: Diagnosis present

## 2016-12-21 DIAGNOSIS — Z7189 Other specified counseling: Secondary | ICD-10-CM

## 2016-12-21 DIAGNOSIS — M19042 Primary osteoarthritis, left hand: Secondary | ICD-10-CM | POA: Diagnosis present

## 2016-12-21 DIAGNOSIS — Z515 Encounter for palliative care: Secondary | ICD-10-CM | POA: Diagnosis not present

## 2016-12-21 DIAGNOSIS — I5043 Acute on chronic combined systolic (congestive) and diastolic (congestive) heart failure: Secondary | ICD-10-CM | POA: Diagnosis present

## 2016-12-21 DIAGNOSIS — I252 Old myocardial infarction: Secondary | ICD-10-CM | POA: Diagnosis not present

## 2016-12-21 DIAGNOSIS — Z6820 Body mass index (BMI) 20.0-20.9, adult: Secondary | ICD-10-CM

## 2016-12-21 DIAGNOSIS — I341 Nonrheumatic mitral (valve) prolapse: Secondary | ICD-10-CM | POA: Diagnosis not present

## 2016-12-21 DIAGNOSIS — E876 Hypokalemia: Secondary | ICD-10-CM | POA: Diagnosis not present

## 2016-12-21 LAB — PROTIME-INR
INR: 1.59
PROTHROMBIN TIME: 19.1 s — AB (ref 11.4–15.2)

## 2016-12-21 LAB — HEPATIC FUNCTION PANEL
ALBUMIN: 2.9 g/dL — AB (ref 3.5–5.0)
ALT: 26 U/L (ref 17–63)
AST: 52 U/L — AB (ref 15–41)
Alkaline Phosphatase: 88 U/L (ref 38–126)
Bilirubin, Direct: 0.4 mg/dL (ref 0.1–0.5)
Indirect Bilirubin: 0.8 mg/dL (ref 0.3–0.9)
TOTAL PROTEIN: 5.9 g/dL — AB (ref 6.5–8.1)
Total Bilirubin: 1.2 mg/dL (ref 0.3–1.2)

## 2016-12-21 LAB — URINALYSIS, ROUTINE W REFLEX MICROSCOPIC
Bilirubin Urine: NEGATIVE
Glucose, UA: NEGATIVE mg/dL
Hgb urine dipstick: NEGATIVE
Ketones, ur: NEGATIVE mg/dL
Leukocytes, UA: NEGATIVE
NITRITE: NEGATIVE
Protein, ur: NEGATIVE mg/dL
SPECIFIC GRAVITY, URINE: 1.009 (ref 1.005–1.030)
pH: 7 (ref 5.0–8.0)

## 2016-12-21 LAB — CBC
HEMATOCRIT: 31.6 % — AB (ref 39.0–52.0)
HEMOGLOBIN: 9.7 g/dL — AB (ref 13.0–17.0)
MCH: 27.6 pg (ref 26.0–34.0)
MCHC: 30.7 g/dL (ref 30.0–36.0)
MCV: 89.8 fL (ref 78.0–100.0)
Platelets: 240 10*3/uL (ref 150–400)
RBC: 3.52 MIL/uL — ABNORMAL LOW (ref 4.22–5.81)
RDW: 19.2 % — ABNORMAL HIGH (ref 11.5–15.5)
WBC: 7.3 10*3/uL (ref 4.0–10.5)

## 2016-12-21 LAB — BASIC METABOLIC PANEL
Anion gap: 11 (ref 5–15)
BUN: 55 mg/dL — AB (ref 6–20)
CO2: 26 mmol/L (ref 22–32)
Calcium: 8.9 mg/dL (ref 8.9–10.3)
Chloride: 97 mmol/L — ABNORMAL LOW (ref 101–111)
Creatinine, Ser: 2.45 mg/dL — ABNORMAL HIGH (ref 0.61–1.24)
GFR calc Af Amer: 28 mL/min — ABNORMAL LOW (ref >60)
GFR, EST NON AFRICAN AMERICAN: 24 mL/min — AB (ref >60)
GLUCOSE: 216 mg/dL — AB (ref 65–99)
Potassium: 4.8 mmol/L (ref 3.5–5.1)
SODIUM: 134 mmol/L — AB (ref 135–145)

## 2016-12-21 LAB — COOXEMETRY PANEL
Carboxyhemoglobin: 1.5 % (ref 0.5–1.5)
METHEMOGLOBIN: 1.1 % (ref 0.0–1.5)
O2 SAT: 64.4 %
TOTAL HEMOGLOBIN: 10 g/dL — AB (ref 12.0–16.0)

## 2016-12-21 LAB — POCT INR: INR: 1.5

## 2016-12-21 LAB — BRAIN NATRIURETIC PEPTIDE: B Natriuretic Peptide: 3403.3 pg/mL — ABNORMAL HIGH (ref 0.0–100.0)

## 2016-12-21 LAB — I-STAT TROPONIN, ED: Troponin i, poc: 0.16 ng/mL (ref 0.00–0.08)

## 2016-12-21 MED ORDER — ONDANSETRON HCL 4 MG/2ML IJ SOLN: 4.0000 mg | Freq: Four times a day (QID) | INTRAMUSCULAR | Status: DC | PRN

## 2016-12-21 MED ORDER — SODIUM CHLORIDE 0.9% FLUSH: 3.0000 mL | INTRAVENOUS | Status: DC | PRN

## 2016-12-21 MED ORDER — SODIUM CHLORIDE 0.9% FLUSH: 10.0000 mL | INTRAVENOUS | Status: DC | PRN

## 2016-12-21 MED ORDER — SODIUM CHLORIDE 0.9% FLUSH
10.0000 mL | Freq: Two times a day (BID) | INTRAVENOUS | Status: DC
  Administered 2016-12-21 – 2016-12-26 (×6): 10 mL

## 2016-12-21 MED ORDER — FUROSEMIDE 10 MG/ML IJ SOLN
80.0000 mg | Freq: Once | INTRAMUSCULAR | Status: AC
  Administered 2016-12-21: 80 mg via INTRAVENOUS
  Filled 2016-12-21: qty 8

## 2016-12-21 MED ORDER — NITROGLYCERIN 0.4 MG SL SUBL: 0.4000 mg | SUBLINGUAL_TABLET | SUBLINGUAL | Status: DC | PRN

## 2016-12-21 MED ORDER — ALBUTEROL SULFATE (2.5 MG/3ML) 0.083% IN NEBU
5.0000 mg | INHALATION_SOLUTION | Freq: Once | RESPIRATORY_TRACT | Status: AC
  Administered 2016-12-21: 5 mg via RESPIRATORY_TRACT
  Filled 2016-12-21: qty 6

## 2016-12-21 MED ORDER — SODIUM CHLORIDE 0.9 % IV SOLN: 250.0000 mL | INTRAVENOUS | Status: DC | PRN

## 2016-12-21 MED ORDER — WARFARIN - PHARMACIST DOSING INPATIENT
Freq: Every day | Status: DC
  Administered 2016-12-25 – 2016-12-26 (×2)

## 2016-12-21 MED ORDER — FUROSEMIDE 10 MG/ML IJ SOLN
80.0000 mg | Freq: Two times a day (BID) | INTRAMUSCULAR | Status: DC
  Administered 2016-12-22 – 2016-12-23 (×3): 80 mg via INTRAVENOUS
  Filled 2016-12-21 (×3): qty 8

## 2016-12-21 MED ORDER — ASPIRIN 81 MG PO CHEW
324.0000 mg | CHEWABLE_TABLET | Freq: Once | ORAL | Status: AC
  Administered 2016-12-21: 324 mg via ORAL
  Filled 2016-12-21: qty 4

## 2016-12-21 MED ORDER — WARFARIN SODIUM 2.5 MG PO TABS
2.5000 mg | ORAL_TABLET | Freq: Once | ORAL | Status: AC
  Administered 2016-12-21: 2.5 mg via ORAL
  Filled 2016-12-21: qty 1

## 2016-12-21 MED ORDER — ACETAMINOPHEN 325 MG PO TABS
650.0000 mg | ORAL_TABLET | ORAL | Status: DC | PRN
  Administered 2016-12-23 – 2016-12-24 (×2): 650 mg via ORAL
  Filled 2016-12-21 (×2): qty 2

## 2016-12-21 MED ORDER — ATORVASTATIN CALCIUM 40 MG PO TABS
40.0000 mg | ORAL_TABLET | Freq: Every morning | ORAL | Status: DC
  Administered 2016-12-22 – 2016-12-27 (×6): 40 mg via ORAL
  Filled 2016-12-21 (×6): qty 1

## 2016-12-21 MED ORDER — SODIUM CHLORIDE 0.9% FLUSH
3.0000 mL | Freq: Two times a day (BID) | INTRAVENOUS | Status: DC
  Administered 2016-12-21 – 2016-12-22 (×2): 10 mL via INTRAVENOUS

## 2016-12-21 MED ORDER — ALLOPURINOL 100 MG PO TABS
100.0000 mg | ORAL_TABLET | Freq: Every day | ORAL | Status: DC
  Administered 2016-12-22 – 2016-12-27 (×6): 100 mg via ORAL
  Filled 2016-12-21 (×6): qty 1

## 2016-12-21 NOTE — Progress Notes (Signed)
Peripherally Inserted Central Catheter/Midline Placement  The IV Nurse has discussed with the patient and/or persons authorized to consent for the patient, the purpose of this procedure and the potential benefits and risks involved with this procedure.  The benefits include less needle sticks, lab draws from the catheter, and the patient may be discharged home with the catheter. Risks include, but not limited to, infection, bleeding, blood clot (thrombus formation), and puncture of an artery; nerve damage and irregular heartbeat and possibility to perform a PICC exchange if needed/ordered by physician.  Alternatives to this procedure were also discussed.  Bard Power PICC patient education guide, fact sheet on infection prevention and patient information card has been provided to patient /or left at bedside.    PICC/Midline Placement Documentation        Darlyn Read 12/21/2016, 6:32 PM

## 2016-12-21 NOTE — ED Provider Notes (Signed)
[facility] DEPT Provider Note   CSN: [id number] Arrival date & time: 12/21/16  1130     History   Chief Complaint No chief complaint on file.   HPI Jason Choi is a 76 y.o. male.  The history is provided by the patient.  Shortness of Breath  This is a recurrent problem. The average episode lasts 1 week. The problem occurs continuously.The current episode started more than 2 days ago. The problem has been gradually worsening. Associated symptoms include cough and sputum production. Pertinent negatives include no fever, no rhinorrhea, no sore throat, no chest pain, no syncope, no vomiting, no abdominal pain and no leg swelling. It is unknown what precipitated the problem. He has had prior hospitalizations. He has had prior ED visits.     76 year old male who presents with shortness of breath. He has a history of CAD status post CABG, ischemic cardiomyopathy with EF of 15% status post AICD placement, and atrial fibrillation on Coumadin. Was just discharged from the hospital one week ago for treatment of CHF exacerbation on the cardiology service. He reports 1 week of progressively worsening shortness of breath and dyspnea on exertion. Denies any significant weight gain, lower extremity edema, abdominal distention, orthopnea or PND. Has not had any associating chest pain. Has had mild nonproductive cough, but no fevers or chills. EMS was called from home today. On their arrival he was 88% on room air. Initially placed on nonrebreather but was still short of breath. He was subsequently placed on CPAP and given sublingual nitroglycerin. On arrival he was comfortably breathing, so by respiratory therapist placed him on 2 L nasal cannula.  Past Medical History:  Diagnosis Date  . AICD (automatic cardioverter/defibrillator) present   . Anemia    Referral to GI Feb 2013  . Arthritis    "hands primarily" (10/12/2016)  . Atrial fibrillation ([place])    ON AMIODARONE: PER VISIT NOTE IN [facility]  FIRST DEGREE HB  . CHF (congestive heart failure) ([place])   . Chronic anticoagulation    on coumadin  . Chronic kidney disease (CKD), stage II (mild)   . Gout   . High risk medication use    on amiodarone  . HTN (hypertension)   . Hypercholesteremia   . LV dysfunction    EF 35 to 40% per echo May 2012; EF remains 35 to 40% per echo Feb 2013. Referred for ICD  . Myocardial infarction ([place])   . Osteoarthritis of right shoulder region    ACUTE PAIN  . 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 Pulmonary hypertension, moderate to severe [facility])     Patient Active Problem List   Diagnosis Date Noted  . Symptomatic bradycardia 12/04/2016  . Acute on chronic systolic and diastolic heart failure, NYHA class 3 ([place]) 12/04/2016  . CHF (congestive heart failure) ([place]) 10/12/2016  . Gout 08/16/2016  . Syncope 07/26/2016  . Encounter for therapeutic drug monitoring 07/08/2013  . Hyponatremia 01/03/2012  . CKD (chronic kidney disease) stage 3, GFR 30-59 ml/min   . Anemia   . CAD (coronary artery disease)   . HTN (hypertension)   . Hypercholesteremia   . Chronic anticoagulation   . Chronic systolic CHF (congestive heart failure) ([place])   . Permanent atrial fibrillation ([place]) 08/22/2010    Past Surgical History:  Procedure Laterality Date  . CARDIAC CATHETERIZATION  2009   Grafts patent  . CARDIAC CATHETERIZATION  2012   Grafts patent. EF was 25 to 30%  but 35 to 40 by echo  . CARDIAC CATHETERIZATION  2003  . CARDIAC DEFIBRILLATOR PLACEMENT  10/12/2016   MDT single chamber device  . CARDIOVERSION  2008--  FOR PAF   SUCCESSFUL  . CARDIOVERSION N/A 06/13/2013   Procedure: CARDIOVERSION;  Surgeon: Peter M Martinique, MD;  Location: [facility] ENDOSCOPY;  Service: Cardiovascular;  Laterality: N/A;  . CATARACT EXTRACTION, BILATERAL Bilateral   . CORONARY ARTERY BYPASS GRAFT  2002   SVG to PDA, Free radial to Intermediate, LIMA to LAD by Dr Prescott Gum  . ICD IMPLANT N/A 10/12/2016   Procedure: ICD Implant;   Surgeon: Constance Haw, MD;  Location: [facility] CV LAB;  Service: Cardiovascular;  Laterality: N/A;  . KNEE ARTHROSCOPY Left 2000  . RIGHT HEART CATH N/A 08/08/2016   Procedure: Right Heart Cath;  Surgeon: Larey Dresser, MD;  Location: [facility] CV LAB;  Service: Cardiovascular;  Laterality: N/A;  . Right Leg surgeries Right X 5-6   due to polio; "relocated muscles; pins in ankle"  . SHOULDER ARTHROSCOPY W/ ROTATOR CUFF REPAIR Right 05/16/2011       Home Medications    Prior to Admission medications   Medication Sig Start Date End Date Taking? Authorizing Provider  acetaminophen (TYLENOL) 325 MG tablet Take 2 tablets (650 mg total) by mouth every 4 (four) hours as needed for headache or mild pain. 08/10/16   Shirley Friar, PA-C  allopurinol (ZYLOPRIM) 100 MG tablet Take 1 tablet (100 mg total) by mouth daily. 09/21/16   Larey Dresser, MD  atorvastatin (LIPITOR) 40 MG tablet Take 1 tablet (40 mg total) by mouth every evening. Patient taking differently: Take 40 mg by mouth every morning.  09/21/16   Larey Dresser, MD  Multiple Vitamin (MULTI-VITAMIN DAILY) TABS Take 1 tablet by mouth daily.    [provider]  nitroGLYCERIN (NITROSTAT) 0.4 MG SL tablet Place 1 tablet (0.4 mg total) under the tongue every 5 (five) minutes x 3 doses as needed for chest pain. 12/06/16   Arbutus Leas, NP  potassium chloride SA (KLOR-CON M20) 20 MEQ tablet Take 1 tablet (20 mEq total) by mouth daily. 09/21/16   Larey Dresser, MD  torsemide (DEMADEX) 20 MG tablet Take 2 tablets (40 mg total) by mouth daily. 12/14/16   Larey Dresser, MD  warfarin (COUMADIN) 6 MG tablet Take 6 mg by mouth See admin instructions. Takes 6 mg (1 tablet)  daily except for Saturday take 3mg  (1/2 tablet)    [provider]    Family History Family History  Problem Relation Age of Onset  . Valvular heart disease Father   . Colon cancer Neg Hx   . Colon polyps Neg Hx   . Rectal cancer  Neg Hx   . Stomach cancer Neg Hx     Social History Social History  Substance Use Topics  . Smoking status: Former Smoker    Packs/day: 2.00    Years: 20.00    Types: Cigarettes  . Smokeless tobacco: Never Used     Comment: 10/12/2016 "haven't had a cigarette since 1980"  . Alcohol use 7.2 oz/week    4 Shots of liquor, 8 Cans of beer per week     Comment: 10/12/2016 "couple beers and a shot of [place] most  3-4 times/week"     Allergies   Lidocaine and Procaine hcl   Review of Systems Review of Systems  Constitutional: Negative for fever.  HENT: Negative for rhinorrhea  and sore throat.   Respiratory: Positive for cough, sputum production and shortness of breath.   Cardiovascular: Negative for chest pain, leg swelling and syncope.  Gastrointestinal: Negative for abdominal pain and vomiting.  All other systems reviewed and are negative.    Physical Exam Updated Vital Signs BP 95/68   Pulse 92   Resp (!) 27   SpO2 100%   Physical Exam Physical Exam  Nursing note and vitals reviewed. Constitutional: non-toxic, and in no acute distress Head: Normocephalic and atraumatic.  Mouth/Throat: Oropharynx is clear and moist.  Neck: Normal range of motion. Neck supple.  Cardiovascular: Normal rate and regular rhythm. No significant lower extremity edema  Pulmonary/Chest: Effort normal. No conversational dyspnea. Coarse crackles at the lung bases bilaterally Abdominal: Soft. There is no tenderness. There is no rebound and no guarding.  Musculoskeletal: Normal range of motion.  Neurological: Alert, no facial droop, fluent speech, moves all extremities symmetrically Skin: Skin is warm and dry.  Psychiatric: Cooperative   ED Treatments / Results  Labs (all labs ordered are listed, but only abnormal results are displayed) Labs Reviewed  BASIC METABOLIC PANEL - Abnormal; Notable for the following:       Result Value   Sodium 134 (*)    Chloride 97 (*)    Glucose, Bld 216  (*)    BUN 55 (*)    Creatinine, Ser 2.45 (*)    GFR calc non Af Amer 24 (*)    GFR calc Af Amer 28 (*)    All other components within normal limits  CBC - Abnormal; Notable for the following:    RBC 3.52 (*)    Hemoglobin 9.7 (*)    HCT 31.6 (*)    RDW 19.2 (*)    All other components within normal limits  BRAIN NATRIURETIC PEPTIDE - Abnormal; Notable for the following:    B Natriuretic Peptide 3,403.3 (*)    All other components within normal limits  HEPATIC FUNCTION PANEL - Abnormal; Notable for the following:    Total Protein 5.9 (*)    Albumin 2.9 (*)    AST 52 (*)    All other components within normal limits  PROTIME-INR - Abnormal; Notable for the following:    Prothrombin Time 19.1 (*)    All other components within normal limits  I-STAT TROPONIN, ED - Abnormal; Notable for the following:    Troponin i, poc 0.16 (*)    All other components within normal limits  URINALYSIS, ROUTINE W REFLEX MICROSCOPIC    EKG  EKG Interpretation  Date/Time:  Thursday December 21 2016 11:32:48 EDT Ventricular Rate:  100 PR Interval:    QRS Duration: 119 QT Interval:  370 QTC Calculation: 478 R Axis:   -30 Text Interpretation:  Atrial fibrillation Aberrant conduction of SV complex(es) Incomplete left bundle branch block Inferior infarct, old history of afib Confirmed by Brantley Stage [phone]) on 12/21/2016 11:58:32 AM       Radiology Dg Chest 2 View  Result Date: 12/21/2016 CLINICAL DATA:  Shortness of breath. EXAM: CHEST  2 VIEW COMPARISON:  12/04/2016 FINDINGS: AICD in stable position. Prior CABG. Cardiomegaly. Increase bilateral pulmonary infiltrates consistent pulmonary edema. Small bilateral pleural effusions cannot be excluded. No pneumothorax IMPRESSION: 1.  AICD in stable position.  Prior CABG. 2. Congestive heart failure with worsening bilateral pulmonary edema . Electronically Signed   By: Marcello Moores  Register   On: 12/21/2016 12:58    Procedures Procedures (including critical care  time)  Medications Ordered  in ED Medications  albuterol (PROVENTIL) (2.5 MG/3ML) 0.083% nebulizer solution 5 mg (5 mg Nebulization Given 12/21/16 1251)  aspirin chewable tablet 324 mg (324 mg Oral Given 12/21/16 1316)  furosemide (LASIX) injection 80 mg (80 mg Intravenous Given 12/21/16 1357)     Initial Impression / Assessment and Plan / ED Course  I have reviewed the triage vital signs and the nursing notes.  Pertinent labs & imaging results that were available during my care of the patient were reviewed by me and considered in my medical decision making (see chart for details).     presented with shortness of breath and acute hypoxic respiratory failure. He has 2 L oxygen requirement while here in the emergency department. Clinically does not look significantly fluid overloaded, but on chest x-ray he does have interstitial edema and pleural effusions. His BNP is elevated again in the 3000. Critical picture is consistent with acute CHF exacerbation. Renal function is near baseline. Given 80 mg of IV Lasix. There is also elevated troponin of 0.16. He has not had chest pain, and this likely related to his heart failure. He does not have acute EKG ischemia. He was just recently on the cardiology service for treatment of heart failure. Cardiology consulted again for admission.  Final Clinical Impressions(s) / ED Diagnoses   Final diagnoses:  Acute on chronic systolic congestive heart failure ([place])  Acute respiratory failure with hypoxia [facility])    New Prescriptions New Prescriptions   No medications on file     Forde Dandy, MD 12/21/16 1406

## 2016-12-21 NOTE — ED Triage Notes (Signed)
Pt here via [facility] with c/o SHOB. Upon arrival of EMS on scene 88% on RA.  Pt placed on a NRB, still SHOB.  Pt was placed on CPAP pta and given 0.4 NTG sublingual.  EMS initially reported rales in lower lobes.  PTA vitals 109/81, HR 80-120, CBG 206, 24 capnog., 97% on 2L O2.

## 2016-12-21 NOTE — Progress Notes (Signed)
ANTICOAGULATION CONSULT NOTE - Initial Consult  Pharmacy Consult for Coumadin Indication: atrial fibrillation  Allergies  Allergen Reactions  . Lidocaine Other (See Comments)    Passed out - at the dentist's office  . Procaine Hcl Other (See Comments)    Passed out - at the dentist's office    Patient Measurements: Weight: 166 lb 8 oz (75.5 kg) Heparin Dosing Weight: n/a  Vital Signs: Temp: 97.5 F (36.4 C) (07/26 2000) Temp Source: Oral (07/26 2000) BP: 94/68 (07/26 2000) Pulse Rate: 80 (07/26 2000)  Labs:  Recent Labs  12/21/16 12/21/16 1153  HGB  --  9.7*  HCT  --  31.6*  PLT  --  240  LABPROT  --  19.1*  INR 1.5 1.59  CREATININE  --  2.45*    Estimated Creatinine Clearance: 28.2 mL/min (A) (by C-G formula based on SCr of 2.45 mg/dL (H)).   Medical History: Past Medical History:  Diagnosis Date  . AICD (automatic cardioverter/defibrillator) present   . Anemia    Referral to GI Feb 2013  . Arthritis    "hands primarily" (10/12/2016)  . Atrial fibrillation ([place])    ON AMIODARONE: PER VISIT NOTE IN [facility] FIRST DEGREE HB  . CHF (congestive heart failure) ([place])   . Chronic anticoagulation    on coumadin  . Chronic kidney disease (CKD), stage II (mild)   . Gout   . High risk medication use    on amiodarone  . HTN (hypertension)   . Hypercholesteremia   . LV dysfunction    EF 35 to 40% per echo May 2012; EF remains 35 to 40% per echo Feb 2013. Referred for ICD  . Myocardial infarction ([place])   . Osteoarthritis of right shoulder region    ACUTE PAIN  . 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 Pulmonary hypertension, moderate to severe ([facility])     Medications:  Prescriptions Prior to Admission  Medication Sig Dispense Refill Last Dose  . acetaminophen (TYLENOL) 325 MG tablet Take 2 tablets (650 mg total) by mouth every 4 (four) hours as needed for headache or mild pain.   Past Week at Unknown time  . allopurinol (ZYLOPRIM) 100 MG tablet Take 1 tablet (100  mg total) by mouth daily. 90 tablet 3 12/21/2016 at Unknown time  . atorvastatin (LIPITOR) 40 MG tablet Take 1 tablet (40 mg total) by mouth every evening. (Patient taking differently: Take 40 mg by mouth every morning. ) 90 tablet 3 12/21/2016 at Unknown time  . Multiple Vitamin (MULTI-VITAMIN DAILY) TABS Take 1 tablet by mouth daily.   12/21/2016 at Unknown time  . nitroGLYCERIN (NITROSTAT) 0.4 MG SL tablet Place 1 tablet (0.4 mg total) under the tongue every 5 (five) minutes x 3 doses as needed for chest pain. 25 tablet 2 12/21/2016 at Unknown time  . potassium chloride SA (KLOR-CON M20) 20 MEQ tablet Take 1 tablet (20 mEq total) by mouth daily. 90 tablet 3 12/21/2016 at Unknown time  . torsemide (DEMADEX) 20 MG tablet Take 2 tablets (40 mg total) by mouth daily. 306 tablet 3 12/21/2016 at Unknown time  . warfarin (COUMADIN) 5 MG tablet Take 5 mg by mouth See admin instructions.    12/21/2016 at 9am      Assessment: 76 yo male on chronic Coumadin for afib.  INR subtherapeutic this AM at Coumadin clinic and they told him to take 5 mg this AM (which he did), and then 2.5 mg tonight.  Patient says  Coumadin dose is supposed to be 6 mg daily with 3 mg on Wed and Sat PTA, but he's been taking 5 mg daily because he only has 5 mg tablets at home.  Goal of Therapy:  INR 2-3 Monitor platelets by anticoagulation protocol: Yes   Plan:  1. Coumadin 2.5 mg x 1 now. 2. Daily INR.  Uvaldo Rising, BCPS  Clinical Pharmacist Pager [phone]  12/21/2016 9:47 PM

## 2016-12-21 NOTE — ED Notes (Signed)
Patient transported to X-ray 

## 2016-12-21 NOTE — H&P (Addendum)
Advanced Heart Failure Team History and Physical Note   Primary Physician:  Clyde Lundborg Primary Cardiologist:  Dr. Martinique Primary HF: Dr. [name]   Reason for Admission: Acute on chronic systolic CHF  HPI:    Jason Choi is a 76 y.o. male with h/o CAD s/p CABG, systolic due to ischemic cardiomyopathy Echo 07/2016 LVEF 15% s/p ICD implant 09/2016 - Medtronic, and permanent Afib  Pt previously admitted 12/04/2016 with fatigue and weakness. Due to hypotension, dig, bb and spiro stopped. Pacing rate increased. Discharged early from [facility] as he demanded to go home. Discharge weight was 163 lbs.   Seen in HF clinic 12/13/16. Weight stable from discharge at that visit at 163 lbs. Concerns rased by [facility] as weight had decreased from 185 -> 159 over 1 month period. Torsemide cut back with weight loss and poor nutrition. Concerned for recurrent low output HF. RHC planned pending labs.   Pt presented to [facility] today with worsening SOB over 1 week. Required CPAP initiation by EMS who were called to his house.  Pertinent labs on admission include Cr 2.45, K 4.8, BNP 3403, Hgb 9.7. Initial Troponin 0.16. CXR with worsening bilateral pulmonary edema.   Pt has been feeling worse since seeing [place] last week. States weight only up 2-3 lbs at home. SOB with minimal exertion and + orthopnea. No energy or appetite. Frustrated with lack of "progress". Feels like medicines aren't working. States he left hospital early last admission because nothing was helping him and he was getting confused. No fevers or chills. Cough but no production. He states he has been taking all of his medication as directed.   Wt Readings from Last 3 Encounters:  12/14/16 163 lb (73.9 kg)  12/06/16 163 lb 8 oz (74.2 kg)  11/30/16 180 lb (81.6 kg)    RHC 07/2016 RA mean 8 RV 53/7 PA 58/22, mean 33 PCWP mean 13 Oxygen saturations: PA 66% AO 94% Cardiac Output (Fick) 7.73  Cardiac Index (Fick) 3.56  PVR 2.6 WU Cardiac  Output (Thermo) 5.86 Cardiac Index (Thermo) 2.70 PVR 3.4 WU  Review of Systems: [y] = yes, [ ]  = no   General: Weight gain [y]; Weight loss [ ] ; Anorexia [ ] ; Fatigue [y]; Fever [ ] ; Chills [ ] ; Weakness [y]  Cardiac: Chest pain/pressure [ ] ; Resting SOB [y]; Exertional SOB [y]; Orthopnea [y]; Pedal Edema [ ] ; Palpitations [ ] ; Syncope [ ] ; Presyncope [ ] ; Paroxysmal nocturnal dyspnea[ ]   Pulmonary: Cough [y]; Wheezing[ ] ; Hemoptysis[ ] ; Sputum [ ] ; Snoring [ ]   GI: Vomiting[ ] ; Dysphagia[ ] ; Melena[ ] ; Hematochezia [ ] ; Heartburn[ ] ; Abdominal pain [ ] ; Constipation [ ] ; Diarrhea [ ] ; BRBPR [ ]   GU: Hematuria[ ] ; Dysuria [ ] ; Nocturia[ ]   Vascular: Pain in legs with walking [ ] ; Pain in feet with lying flat [ ] ; Non-healing sores [ ] ; Stroke [ ] ; TIA [ ] ; Slurred speech [ ] ;  Neuro: Headaches[ ] ; Vertigo[ ] ; Seizures[ ] ; Paresthesias[ ] ;Blurred vision [ ] ; Diplopia [ ] ; Vision changes [ ]   Ortho/Skin: Arthritis [y]; Joint pain [y]; Muscle pain [ ] ; Joint swelling [ ] ; Back Pain [ ] ; Rash [ ]   Psych: Depression[ ] ; Anxiety[y]  Heme: Bleeding problems [ ] ; Clotting disorders [ ] ; Anemia [ ]   Endocrine: Diabetes [ ] ; Thyroid dysfunction[ ]    Home Medications Prior to Admission medications   Medication Sig Start Date End Date Taking? Authorizing Provider  acetaminophen (TYLENOL) 325 MG tablet Take 2  tablets (650 mg total) by mouth every 4 (four) hours as needed for headache or mild pain. 08/10/16   Shirley Friar, PA-C  allopurinol (ZYLOPRIM) 100 MG tablet Take 1 tablet (100 mg total) by mouth daily. 09/21/16   Larey Dresser, MD  atorvastatin (LIPITOR) 40 MG tablet Take 1 tablet (40 mg total) by mouth every evening. Patient taking differently: Take 40 mg by mouth every morning.  09/21/16   Larey Dresser, MD  Multiple Vitamin (MULTI-VITAMIN DAILY) TABS Take 1 tablet by mouth daily.    [provider]  nitroGLYCERIN (NITROSTAT) 0.4 MG SL tablet Place 1 tablet (0.4 mg total)  under the tongue every 5 (five) minutes x 3 doses as needed for chest pain. 12/06/16   Arbutus Leas, NP  potassium chloride SA (KLOR-CON M20) 20 MEQ tablet Take 1 tablet (20 mEq total) by mouth daily. 09/21/16   Larey Dresser, MD  torsemide (DEMADEX) 20 MG tablet Take 2 tablets (40 mg total) by mouth daily. 12/14/16   Larey Dresser, MD  warfarin (COUMADIN) 6 MG tablet Take 6 mg by mouth See admin instructions. Takes 6 mg (1 tablet)  daily except for Saturday take 3mg  (1/2 tablet)    [provider]    Past Medical History: Past Medical History:  Diagnosis Date  . AICD (automatic cardioverter/defibrillator) present   . Anemia    Referral to GI Feb 2013  . Arthritis    "hands primarily" (10/12/2016)  . Atrial fibrillation ([place])    ON AMIODARONE: PER VISIT NOTE IN [facility]  . CHF (congestive heart failure) ([place])   . Chronic anticoagulation    on coumadin  . Chronic kidney disease (CKD), stage II (mild)   . Gout   . High risk medication use    on amiodarone  . HTN (hypertension)   . Hypercholesteremia   . LV dysfunction    EF 35 to 40% per echo May 2012; EF remains 35 to 40% per echo Feb 2013. Referred for ICD  . Myocardial infarction ([place])   . Osteoarthritis of right shoulder region    ACUTE PAIN  . 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 Pulmonary hypertension, moderate to severe [facility])     Past Surgical History: Past Surgical History:  Procedure Laterality Date  . CARDIAC CATHETERIZATION  2009   Grafts patent  . CARDIAC CATHETERIZATION  2012   Grafts patent. EF was 25 to 30% but 35 to 40 by echo  . CARDIAC CATHETERIZATION  2003  . CARDIAC DEFIBRILLATOR PLACEMENT  10/12/2016   MDT single chamber device  . CARDIOVERSION  2008--  FOR PAF   SUCCESSFUL  . CARDIOVERSION N/A 06/13/2013   Procedure: CARDIOVERSION;  Surgeon: Peter M Martinique, MD;  Location[facility] ENDOSCOPY;  Service: Cardiovascular;  Laterality: N/A;  . CATARACT EXTRACTION, BILATERAL Bilateral     . CORONARY ARTERY BYPASS GRAFT  2002   SVG to PDA, Free radial to Intermediate, LIMA to LAD by Dr Prescott Gum  . ICD IMPLANT N/A 10/12/2016   Procedure: ICD Implant;  Surgeon: Constance Haw, MD;  Location: [place] CV LAB;  Service: Cardiovascular;  Laterality: N/A;  . KNEE ARTHROSCOPY Left 2000  . RIGHT HEART CATH N/A 08/08/2016   Procedure: Right Heart Cath;  Surgeon: Larey Dresser, MD;  Location: [place] CV LAB;  Service: Cardiovascular;  Laterality: N/A;  . Right Leg surgeries Right X 5-6   due to polio; "relocated muscles;  pins in ankle"  . SHOULDER ARTHROSCOPY W/ ROTATOR CUFF REPAIR Right 05/16/2011    Family History:  Family History  Problem Relation Age of Onset  . Valvular heart disease Father   . Colon cancer Neg Hx   . Colon polyps Neg Hx   . Rectal cancer Neg Hx   . Stomach cancer Neg Hx     Social History: Social History   Social History  . Marital status: Widowed    Spouse name: N/A  . Number of children: 1  . Years of education: N/A   Occupational History  . 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  [employer]   Social History Main Topics  . Smoking status: Former Smoker    Packs/day: 2.00    Years: 20.00    Types: Cigarettes  . Smokeless tobacco: Never Used     Comment: 10/12/2016 "haven't had a cigarette since 1980"  . Alcohol use 7.2 oz/week    4 Shots of liquor, 8 Cans of beer per week     Comment: 10/12/2016 "couple beers and a shot of [place] royal most  3-4 times/week"  . Drug use: No  . Sexual activity: No   Other Topics Concern  . None   Social History Narrative   Pt lives in [place] with spouse (who is a Systems analyst).  1 grown healthy daughter who is a Insurance claims handler.       Retired 2003 from [employer].  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    Allergies:  Allergies  Allergen Reactions  . Lidocaine Other (See Comments)    Passed out - at the dentist's office  .  Procaine Hcl Other (See Comments)    Passed out - at the dentist's office    Objective:    Vital Signs:   Pulse Rate:  [42-143] 88 (07/26 1400) Resp:  [18-36] 26 (07/26 1400) BP: (95-117)/(61-91) 95/61 (07/26 1400) SpO2:  [93 %-100 %] 100 % (07/26 1400)    Physical Exam     General:  Elderly and chronically ill appearing. Conversational dyspnea.  HEENT: Atraumatic. Neck: Supple. JVP 14 cm at least. Carotids 2+ bilat; no bruits. No lymphadenopathy or thyromegaly appreciated. Cor: PMI nondisplaced. Irregular, slightly tachy. 1/6 HSM apex.  L upper chest ICD.  Lungs: Diminished throughout with crackles Abdomen: Obese, soft, nontender, nondistended. No hepatosplenomegaly. No bruits or masses. Good bowel sounds. Extremities: No cyanosis, clubbing, or rash. Trace ankle edema. Thin Neuro: Alert & oriented x 3, cranial nerves grossly intact. moves all 4 extremities w/o difficulty. Affect  Flat and anxious.   Telemetry   Personally reviewed, Afib 90-100s with occasional PVCs  EKG   Personally reviewed, A fib 100 bpm   Labs     Basic Metabolic Panel:  Recent Labs Lab 12/14/16 1435 12/21/16 1153  NA 137 134*  K 4.2 4.8  CL 95* 97*  CO2 32 26  GLUCOSE 148* 216*  BUN 62* 55*  CREATININE 2.30* 2.45*  CALCIUM 9.5 8.9    Liver Function Tests:  Recent Labs Lab 12/21/16 1153  AST 52*  ALT 26  ALKPHOS 88  BILITOT 1.2  PROT 5.9*  ALBUMIN 2.9*   No results for input(s): LIPASE, AMYLASE in the last 168 hours. No results for input(s): AMMONIA in the last 168 hours.  CBC:  Recent Labs Lab 12/14/16 1435 12/21/16 1153  WBC 10.1 7.3  HGB 11.0* 9.7*  HCT 35.1* 31.6*  MCV 89.8 89.8  PLT 224 240    Cardiac Enzymes: No results for input(s): CKTOTAL, CKMB, CKMBINDEX, TROPONINI in the last 168 hours.  BNP: BNP (last 3 results)  Recent Labs  12/04/16 1128 12/14/16 1436 12/21/16 1154  BNP 1,974.7* 1,024.0* 3,403.3*    ProBNP (last 3 results) No results for  input(s): PROBNP in the last 8760 hours.   CBG: No results for input(s): GLUCAP in the last 168 hours.  Coagulation Studies:  Recent Labs  12/21/16 12/21/16 1153  LABPROT  --  19.1*  INR 1.5 1.59    Imaging: Dg Chest 2 View  Result Date: 12/21/2016 CLINICAL DATA:  Shortness of breath. EXAM: CHEST  2 VIEW COMPARISON:  12/04/2016 FINDINGS: AICD in stable position. Prior CABG. Cardiomegaly. Increase bilateral pulmonary infiltrates consistent pulmonary edema. Small bilateral pleural effusions cannot be excluded. No pneumothorax IMPRESSION: 1.  AICD in stable position.  Prior CABG. 2. Congestive heart failure with worsening bilateral pulmonary edema . Electronically Signed   By: Marcello Moores  Register   On: 12/21/2016 12:58     Patient Profile   Jason Choi is a 76 y.o. male with h/o CAD s/p CABG, systolic due to ischemic cardiomyopathy Echo 07/2016 LVEF 15% s/p ICD implant 09/2016 - Medtronic, and permanent Afib  Admitted 12/21/16 with worsening SOB and concerns for low output  Assessment/Plan   1. Chronic systolic CHF: Ischemic cardiomyopathy. Echo (07/28/16) LVEF 15%, severe LV dilation, severe MR, mild RV dysfunction, Mod RAE, PA peak pressure 49 mm Hg.  This is worsened from 6/14 echo with EF 35-40%.  S/P Medtronic Single Chamber ICD. Activity less than 1 hour per day.  - NYHA IIIb-IV symptoms.  - Volume status elevated on exam. Concern for recurrent low output. Will order PICC line and check Coox and CVP.  - Agree with IV lasix 80 mg BID.  - No Ace/dig/Spiro with elevated creatinine.  - No BB with decompensation and concern for low output.  - Repeat Echo.  2. Syncope. - No driving until 06/3760. No recurrent syncope.  3. CAD: S/p CABG in 2002.  - No chest pain. No ACS.  - No ASA with stable CAD on coumadin.   4. CKD: Stage III-IV - Baseline 2.0 - 2.2 - Follow closely with diuresis.  - Not good dialysis candiate with likely end stage CHF.  5. Permanent Afib; failed amiodarone  and DCCV.  - Rate elevated today with decompensation. No BB with recent bradycardia and ? Low output. Hope will improve with diuresis.  - Continue coumadin. No bleeding.  6. Right foot ulceration - Stable. 7. 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   - Continue colchicine and allopurinol.  No change.  8. Deconditioning:  - Will have cardiac rehab see.  9. ETOH abuse  - Encouraged complete cessation.  10. Subtherapeutic INR - Calls medication compliance into question. Especially given tendency to leave hospital prior to completion of evaluation/treatment  Will admit to stepdown for diuresis. Consider RHC once diuresed. If patient leaves hospital prior to completion of evaluation, would strongly consider palliative care for goals of care. He has poor insight into his disease despite multiple conversations.   Shirley Friar, PA-C 12/21/2016, 2:16 PM  Advanced Heart Failure Team Pager [phone] (M-F; 7a - 4p)  Please contact [facility] for night-coverage after hours (4p -7a ) and weekends on [email or web address]  Patient seen with PA, agree with the above note.  Mr Heupel returns with CHF exacerbation and elevated creatinine.  1. Acute on chronic systolic CHF: Echo with EF 15%, severe LV dilation, severe MR, mild RV dysfunction. Ischemic cardiomyopathy.  Medtronic ICD.  He is volume overloaded on exam with elevated JVP and pulmonary edema on CXR.  He appears mild tachypneic.  I am concerned for recurrent low output HF.   - Place PICC to follow CVP and co-ox.  If co-ox is low, will begin milrinone.  Given quick bounce-back from last admission, I am concerned that he may end up needing home milrinone.  - Lasix 80 mg IV bid for now.  - No ACEI/ARB/ARNI or spironolactone for now with CKD stage IV.  No digoxin with CKD IV.  - With deconditioning, CKD, and foot ulcer, not a candidate for LVAD.  2. Syncope: While driving in 9/51, no driving until 8/84.  3. CAD: CABG 2002.  No chest pain.   Mild troponin elevation likely demand ischemia with volume overload. Continue atorvastatin.  4. Atrial fibrillation: Chronic.  HR is controlled.  Continue warfarin.  5. Mitral regurgitation: Severe on last echo, likely functional.  6. Right foot ulcer: Will have wound care see.  7. Gout: Continue allopurinol and colchicine.  8. CKD: Stage IV.  Creatinine is near his baseline.    Loralie Champagne 12/21/2016 4:50 PM

## 2016-12-21 NOTE — ED Notes (Signed)
This EMT encouraged urine from pt. Pt unable to provide at this time.

## 2016-12-21 NOTE — ED Notes (Signed)
Attempted report x1. 

## 2016-12-22 ENCOUNTER — Inpatient Hospital Stay (HOSPITAL_COMMUNITY): Payer: Medicare Other

## 2016-12-22 DIAGNOSIS — I341 Nonrheumatic mitral (valve) prolapse: Secondary | ICD-10-CM

## 2016-12-22 LAB — COMPREHENSIVE METABOLIC PANEL
ALBUMIN: 2.8 g/dL — AB (ref 3.5–5.0)
ALK PHOS: 78 U/L (ref 38–126)
ALT: 26 U/L (ref 17–63)
ANION GAP: 11 (ref 5–15)
AST: 43 U/L — AB (ref 15–41)
BUN: 52 mg/dL — AB (ref 6–20)
CALCIUM: 8.7 mg/dL — AB (ref 8.9–10.3)
CO2: 27 mmol/L (ref 22–32)
Chloride: 98 mmol/L — ABNORMAL LOW (ref 101–111)
Creatinine, Ser: 2.31 mg/dL — ABNORMAL HIGH (ref 0.61–1.24)
GFR calc Af Amer: 30 mL/min — ABNORMAL LOW (ref >60)
GFR calc non Af Amer: 26 mL/min — ABNORMAL LOW (ref >60)
GLUCOSE: 98 mg/dL (ref 65–99)
Potassium: 4.5 mmol/L (ref 3.5–5.1)
SODIUM: 136 mmol/L (ref 135–145)
Total Bilirubin: 1 mg/dL (ref 0.3–1.2)
Total Protein: 5.5 g/dL — ABNORMAL LOW (ref 6.5–8.1)

## 2016-12-22 LAB — CBC WITH DIFFERENTIAL/PLATELET
Basophils Absolute: 0.1 10*3/uL (ref 0.0–0.1)
Basophils Relative: 1 %
EOS PCT: 19 %
Eosinophils Absolute: 1.4 10*3/uL — ABNORMAL HIGH (ref 0.0–0.7)
HCT: 28.5 % — ABNORMAL LOW (ref 39.0–52.0)
HEMOGLOBIN: 8.9 g/dL — AB (ref 13.0–17.0)
LYMPHS ABS: 1.1 10*3/uL (ref 0.7–4.0)
LYMPHS PCT: 15 %
MCH: 27.9 pg (ref 26.0–34.0)
MCHC: 31.2 g/dL (ref 30.0–36.0)
MCV: 89.3 fL (ref 78.0–100.0)
Monocytes Absolute: 0.8 10*3/uL (ref 0.1–1.0)
Monocytes Relative: 10 %
NEUTROS PCT: 55 %
Neutro Abs: 4.3 10*3/uL (ref 1.7–7.7)
Platelets: 207 10*3/uL (ref 150–400)
RBC: 3.19 MIL/uL — AB (ref 4.22–5.81)
RDW: 18.8 % — ABNORMAL HIGH (ref 11.5–15.5)
WBC: 7.7 10*3/uL (ref 4.0–10.5)

## 2016-12-22 LAB — ECHOCARDIOGRAM COMPLETE
Ao-asc: 33 cm
CHL CUP MV DEC (S): 100
E decel time: 100 msec
FS: 7 % — AB (ref 28–44)
IVS/LV PW RATIO, ED: 1.03
LA ID, A-P, ES: 49 mm
LA diam end sys: 49 mm
LA vol index: 72.5 mL/m2
LA vol: 143 mL
LADIAMINDEX: 2.48 cm/m2
LAVOLA4C: 120 mL
LV PW d: 8.77 mm — AB (ref 0.6–1.1)
LVOT area: 3.46 cm2
LVOTD: 21 mm
MV Peak grad: 5 mmHg
MV VTI: 129 cm
MV pk A vel: 39.3 m/s
MV pk E vel: 113 m/s
PISA EROA: 0.27 cm2
RV TAPSE: 11.8 mm
RV sys press: 58 mmHg
Reg peak vel: 328 cm/s
TR max vel: 328 cm/s
Weight: 2620.8 oz

## 2016-12-22 LAB — PROTIME-INR
INR: 1.75
Prothrombin Time: 20.7 seconds — ABNORMAL HIGH (ref 11.4–15.2)

## 2016-12-22 LAB — MRSA PCR SCREENING: MRSA BY PCR: NEGATIVE

## 2016-12-22 LAB — COOXEMETRY PANEL
Carboxyhemoglobin: 1.6 % — ABNORMAL HIGH (ref 0.5–1.5)
METHEMOGLOBIN: 0.9 % (ref 0.0–1.5)
O2 SAT: 60.3 %
TOTAL HEMOGLOBIN: 8.8 g/dL — AB (ref 12.0–16.0)

## 2016-12-22 LAB — TROPONIN I
Troponin I: 0.11 ng/mL (ref <0.03)
Troponin I: 0.1 ng/mL (ref <0.03)

## 2016-12-22 MED ORDER — ENSURE ENLIVE PO LIQD
237.0000 mL | Freq: Four times a day (QID) | ORAL | Status: DC
  Administered 2016-12-22 – 2016-12-27 (×17): 237 mL via ORAL

## 2016-12-22 MED ORDER — PERFLUTREN LIPID MICROSPHERE
INTRAVENOUS | Status: AC
  Filled 2016-12-22: qty 10

## 2016-12-22 MED ORDER — WARFARIN SODIUM 5 MG PO TABS
6.0000 mg | ORAL_TABLET | Freq: Once | ORAL | Status: AC
  Administered 2016-12-22: 6 mg via ORAL
  Filled 2016-12-22: qty 1

## 2016-12-22 MED ORDER — ALPRAZOLAM 0.25 MG PO TABS: 0.2500 mg | ORAL_TABLET | Freq: Two times a day (BID) | ORAL | Status: DC | PRN

## 2016-12-22 MED ORDER — PERFLUTREN LIPID MICROSPHERE
1.0000 mL | INTRAVENOUS | Status: AC | PRN
  Administered 2016-12-22: 2 mL via INTRAVENOUS
  Filled 2016-12-22: qty 10

## 2016-12-22 MED ORDER — METOLAZONE 5 MG PO TABS
5.0000 mg | ORAL_TABLET | Freq: Once | ORAL | Status: AC
  Administered 2016-12-22: 5 mg via ORAL
  Filled 2016-12-22: qty 1

## 2016-12-22 NOTE — Progress Notes (Signed)
Initial Nutrition Assessment  DOCUMENTATION CODES:   Severe malnutrition in context of chronic illness  INTERVENTION:   Ensure Enlive po QID, each supplement provides 350 kcal and 20 grams of protein  NUTRITION DIAGNOSIS:   Malnutrition (severe) related to chronic illness (CHF) as evidenced by severe depletion of body fat, severe depletion of muscle mass, percent weight loss.  GOAL:   Patient will meet greater than or equal to 90% of their needs  MONITOR:   PO intake, Supplement acceptance  REASON FOR ASSESSMENT:   Malnutrition Screening Tool    ASSESSMENT:   76 yo male with PMH of HTN, HLD, LV dysfunction, polio, pulmonary HTN, AICD, CHF, CKD-2 who was admitted on 7/26 with acute on chronic systolic CHF.   Patient reports recent weight loss due to poor intake at rehab facility. Since returning home, he has been eating better; he drinks Ensure supplements 4-5 times daily, he prefers vanilla flavor. Nutrition-Focused physical exam completed. Findings are severe fat depletion, severe muscle depletion, and no edema.  13% weight loss in the past 3 months is significant. Labs and medications reviewed.  Diet Order:  Diet Heart Room service appropriate? Yes; Fluid consistency: Thin; Fluid restriction: 1800 mL Fluid  Skin:  Wound (see comment) (R foot full thickness wound)  Last BM:  7/24  Height:   Ht Readings from Last 1 Encounters:  12/22/16 6\' 2"  (1.88 m)    Weight:   Wt Readings from Last 1 Encounters:  12/22/16 163 lb 12.8 oz (74.3 kg)    Ideal Body Weight:  86.4 kg  BMI:  Body mass index is 21.03 kg/m.  Estimated Nutritional Needs:   Kcal:  2100-2300  Protein:  100-120 gm  Fluid:  2.1 L  EDUCATION NEEDS:   No education needs identified at this time  Molli Barrows, [place], [place], [place] Pager [phone] After Hours Pager [phone]

## 2016-12-22 NOTE — Progress Notes (Signed)
[place] for Coumadin Indication: atrial fibrillation  Allergies  Allergen Reactions  . Lidocaine Other (See Comments)    Passed out - at the dentist's office  . Procaine Hcl Other (See Comments)    Passed out - at the dentist's office    Patient Measurements: Weight: 163 lb 12.8 oz (74.3 kg) Heparin Dosing Weight: n/a  Vital Signs: Temp: 97.6 F (36.4 C) (07/27 0900) Temp Source: Axillary (07/27 0900) BP: 89/70 (07/27 0500) Pulse Rate: 62 (07/27 0500)  Labs:  Recent Labs  12/21/16 12/21/16 1153 12/21/16 2324 12/22/16 0523  HGB  --  9.7*  --  8.9*  HCT  --  31.6*  --  28.5*  PLT  --  240  --  207  LABPROT  --  19.1*  --  20.7*  INR 1.5 1.59  --  1.75  CREATININE  --  2.45*  --  2.31*  TROPONINI  --   --  0.11* 0.10*    Estimated Creatinine Clearance: 29.5 mL/min (A) (by C-G formula based on SCr of 2.31 mg/dL (H)).  Assessment: 76 yo male on chronic Coumadin for afib. INR continues to be subtherapeutic at 1.75 this morning with slow trend up. Hgb down slightly from 9.7>8.9, no bleeding issues noted.  Patient seen in Coumadin clinic 7/26 and they told him to take 5 mg this AM (which he did), and then 2.5 mg tonight.  Patient says Coumadin dose is supposed to be 6 mg daily with 3 mg on Wed and Sat PTA, but he's been taking 5 mg daily because he only has 5 mg tablets at home.  Goal of Therapy:  INR 2-3 Monitor platelets by anticoagulation protocol: Yes   Plan:  1. Coumadin 6 mg tonight 2. Daily INR.  Erin Hearing PharmD., BCPS Clinical Pharmacist Pager [phone] 12/22/2016 9:28 AM

## 2016-12-22 NOTE — Progress Notes (Signed)
  Echocardiogram 2D Echocardiogram has been performed.  Jason Choi G Jason Choi 12/22/2016, 9:30 AM

## 2016-12-22 NOTE — Progress Notes (Signed)
Advanced Heart Failure Rounding Note  PCP: Clyde Lundborg Primary Cardiologist: Dr. Martinique HF Cardiology: Dr. [name]  Subjective:    Admitted with volume overload, concern for low output HF. Co ox 60%. Weight down 3 pounds overnight. Feeling better, breathing better. Says he feels slightly anxious.   Creatinine improved. CVP 13.   Objective:   Weight Range: 163 lb 12.8 oz (74.3 kg) Body mass index is 20.47 kg/m.   Vital Signs:   Temp:  [97.5 F (36.4 C)-97.8 F (36.6 C)] 97.8 F (36.6 C) (07/27 0500) Pulse Rate:  [42-143] 62 (07/27 0500) Resp:  [13-36] 19 (07/27 0500) BP: (89-117)/(61-91) 89/70 (07/27 0500) SpO2:  [93 %-100 %] 97 % (07/27 0500) Weight:  [163 lb 12.8 oz (74.3 kg)-166 lb 8 oz (75.5 kg)] 163 lb 12.8 oz (74.3 kg) (07/27 0659) Last BM Date: 12/21/16  Weight change: Filed Weights   12/21/16 2000 12/22/16 0659  Weight: 166 lb 8 oz (75.5 kg) 163 lb 12.8 oz (74.3 kg)    Intake/Output:   Intake/Output Summary (Last 24 hours) at 12/22/16 0751 Last data filed at 12/22/16 0504  Gross per 24 hour  Intake              390 ml  Output             1450 ml  Net            -1060 ml      Physical Exam  CVP 13 General: Elderly male, NAD.  HEENT: Normal Neck: Supple. JVP to jaw . Carotids 2+ bilat; no bruits. No lymphadenopathy or thyromegaly appreciated. Cor: PMI nondisplaced. Irregularly irregular. 1/6 HSM at apex. L upper chest ICD.   Lungs: Clear in upper lobes, diminished crackles in bases.  Abdomen: Soft, nontender, nondistended. No hepatosplenomegaly. No bruits or masses. Good bowel sounds. Extremities: No cyanosis, clubbing, rash, edema. Warm. No edema.  Neuro: Alert & orientedx3, cranial nerves grossly intact. moves all 4 extremities w/o difficulty. Affect anxious, agitated.    Telemetry   Afib rate 97 - personally reviewed.   EKG    Afib 100 bpm - personally reviewed.   Labs    CBC  Recent Labs  12/21/16 1153 12/22/16 0523  WBC  7.3 7.7  NEUTROABS  --  4.3  HGB 9.7* 8.9*  HCT 31.6* 28.5*  MCV 89.8 89.3  PLT 240 485   Basic Metabolic Panel  Recent Labs  12/21/16 1153 12/22/16 0523  NA 134* 136  K 4.8 4.5  CL 97* 98*  CO2 26 27  GLUCOSE 216* 98  BUN 55* 52*  CREATININE 2.45* 2.31*  CALCIUM 8.9 8.7*   Liver Function Tests  Recent Labs  12/21/16 1153 12/22/16 0523  AST 52* 43*  ALT 26 26  ALKPHOS 88 78  BILITOT 1.2 1.0  PROT 5.9* 5.5*  ALBUMIN 2.9* 2.8*   No results for input(s): LIPASE, AMYLASE in the last 72 hours. Cardiac Enzymes  Recent Labs  12/21/16 2324 12/22/16 0523  TROPONINI 0.11* 0.10*    BNP: BNP (last 3 results)  Recent Labs  12/04/16 1128 12/14/16 1436 12/21/16 1154  BNP 1,974.7* 1,024.0* 3,403.3*    ProBNP (last 3 results) No results for input(s): PROBNP in the last 8760 hours.   D-Dimer No results for input(s): DDIMER in the last 72 hours. Hemoglobin A1C No results for input(s): HGBA1C in the last 72 hours. Fasting Lipid Panel No results for input(s): CHOL, HDL, LDLCALC, TRIG, CHOLHDL, LDLDIRECT in the last  72 hours. Thyroid Function Tests No results for input(s): TSH, T4TOTAL, T3FREE, THYROIDAB in the last 72 hours.  Invalid input(s): FREET3  Other results:   Imaging    Dg Chest 2 View  Result Date: 12/21/2016 CLINICAL DATA:  Shortness of breath. EXAM: CHEST  2 VIEW COMPARISON:  12/04/2016 FINDINGS: AICD in stable position. Prior CABG. Cardiomegaly. Increase bilateral pulmonary infiltrates consistent pulmonary edema. Small bilateral pleural effusions cannot be excluded. No pneumothorax IMPRESSION: 1.  AICD in stable position.  Prior CABG. 2. Congestive heart failure with worsening bilateral pulmonary edema . Electronically Signed   By: Marcello Moores  Register   On: 12/21/2016 12:58   Dg Chest Port 1 View  Result Date: 12/21/2016 CLINICAL DATA:  Status post PICC line placement EXAM: PORTABLE CHEST 1 VIEW COMPARISON:  12/21/2016 FINDINGS: Right-sided PICC  line has been placed and lies at the cavoatrial junction. Defibrillator is again seen and stable. Postoperative changes are again noted. Cardiomegaly is seen with stable changes of congestive failure. No new focal abnormality is noted. IMPRESSION: PICC line in satisfactory position. No change in the exam from earlier in the same day. Electronically Signed   By: Inez Catalina M.D.   On: 12/21/2016 18:58      Medications:     Scheduled Medications: . allopurinol  100 mg Oral Daily  . atorvastatin  40 mg Oral q morning - 10a  . furosemide  80 mg Intravenous BID  . sodium chloride flush  10-40 mL Intracatheter Q12H  . sodium chloride flush  3 mL Intravenous Q12H  . Warfarin - Pharmacist Dosing Inpatient   Does not apply q1800     Infusions: . sodium chloride       PRN Medications:  sodium chloride, acetaminophen, nitroGLYCERIN, ondansetron (ZOFRAN) IV, sodium chloride flush, sodium chloride flush    Patient Profile   Jason Choi is a 76 y.o. male with h/o CAD s/p CABG, systolic due to ischemic cardiomyopathy Echo 07/2016 LVEF 15% s/p ICD implant 09/2016 - Medtronic, and permanent Afib  Admitted 12/21/16 with worsening SOB and concerns for low output  Assessment/Plan   1. Chronic systolic CHF: Ischemic cardiomyopathy. Echo (07/28/16) LVEF 15%, severe LV dilation, severe MR, mild RV dysfunction, Mod RAE, PA peak pressure 49 mm Hg. This is worsened from 6/14 echo with EF 35-40%. S/P Medtronic Single Chamber ICD. Activity less than 1 hour per day.  - NYHA IIIb  - CVP 13, Continue IV lasix 80 mg BID and add metolazone 5 mg today.  - Co ox 60%, will hold off on adding milrinone.  - No ACE/ARB/Dig/Spiro with CKD.  - Holding off on beta blocker.  - Repeat Echo pending.   2. Syncope. - No driving until 12/2954.  - He has had no recurrent syncope.   3. CAD: S/p CABG in 2002.  - No chest pain. No ACS.  - No ASA with stable CAD and coumadin.   4. CKD: Stage III-IV - Baseline  2.0 - 2.2 - Follow closely with diuresis.  - Not good dialysis candiate with likely end stage CHF.  - No change.   5. Permanent Afib; failed amiodarone and DCCV.  - rates in the 90-100's. No beta blocker with decompensation.  - Continue coumadin for anticoagulation   6. Right foot ulceration - Stable  7. 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  - Continue colchicine and allopurinol.  - No change.   8. Deconditioning:  - Will have cardiac rehab see.  - No  change.   9. ETOH abuse  - Encouraged complete cessation.  - He says he has not had a drink in 3 weeks.   10. Subtherapeutic INR - Calls medication compliance into question. Especially given tendency to leave hospital prior to completion of evaluation/treatment - INR 1.75 today.   11. Anxiety - Will add 0.25 mg Xanax BID prn - Very restless, cant get comfortable.   Length of Stay: [place], NP  12/22/2016, 7:51 AM  Advanced Heart Failure Team Pager [phone] (M-F; 7a - 4p)  Please contact [facility] for night-coverage after hours (4p -7a ) and weekends on [email or web address]  Patient seen with NP, agree with the above note.  Mr Suder returned with CHF exacerbation and elevated creatinine.  1. Acute on chronic systolic CHF: Echo in 1/19 with EF 15%, severe LV dilation, severe MR, mild RV dysfunction. Ischemic cardiomyopathy.  Medtronic ICD.  He did diurese last night, feels a bit better today.  Co-ox 60% with CVP 13.  He is volume overloaded on exam.    - Follow co-ox, no milrinone for now.  Will see how he diureses.   - Lasix 80 mg IV bid for now, will give a dose of metolazone 5 mg po x 1.  - No ACEI/ARB/ARNI or spironolactone for now with CKD stage IV.  No digoxin with CKD IV.  - With deconditioning, CKD, and foot ulcer, not a candidate for LVAD.  2. Syncope: While driving in 4/17, no driving until 4/08.  3. CAD: CABG 2002.  No chest pain.  Mild troponin elevation likely demand ischemia with volume  overload. Continue atorvastatin.  4. Atrial fibrillation: Chronic.  HR is controlled.  Continue warfarin.  5. Mitral regurgitation: Severe on last echo, likely functional.  6. Right foot ulcer: Will have wound care see.  7. Gout: Continue allopurinol and colchicine.  8. CKD: Stage IV.  Creatinine is lower today.   Loralie Champagne 12/22/2016 8:55 AM

## 2016-12-22 NOTE — Consult Note (Signed)
[place] Nurse wound consult note Reason for Consult: Right foot wound Wound type:full thickness Pressure Injury POA: NA Measurement:na Wound bed:na Drainage (amount, consistency, odor) na Periwound:na Dressing procedure/placement/frequency:Patient has synthetic skin, Oasis, placed every Monday at the wound center.  Please leave the dressing in place and if pt is still hospitalized Monday, wound team will return to dress wound. Since patient is not admitted for wound complications, I  will not unwrap it at this point since it would pull the synthetic skin off. We will follow this patient and remain available to this patient, nursing, and the medical and surgical teams. We will return Monday 7/30 if patient is still hospitalized.  Please re-consult if we need to assist further.   Fara Olden, RN-C, WTA-C, [facility] Wound Treatment Associate

## 2016-12-22 NOTE — Care Management Note (Addendum)
Case Management Note  Patient Details  Name: Jason Choi MRN: [id number] Date of Birth: 06/23/1940  Subjective/Objective:   Pt presented for SOB- CHF initiated on IV Lasix. Pt is from home with support of HH Aide Monday-Friday 9am - 12 pm.  Per pt he has [facility] RN/ PT services with [facility].                   Action/Plan: CM did call [facility] to see if pt is  Currently active with [facility]. If so pt will need resumption orders for HHRN/ PT Services.   Expected Discharge Date:                  Expected Discharge Plan:  [place]  In-House Referral:  NA  Discharge planning Services  CM Consult  Post Acute Care Choice:  Home Health, Resumption of Svcs/PTA Provider, Durable Medical Equipment Choice offered to:  Patient  DME Arranged:  [place] DME Agency:  [place]:  RN, PT [place] Agency:  [facility] ([facility])  Status of Service: Completed If discussed at [employer] of Stay Meetings, dates discussed:  12-26-16  Additional Comments: 0865 12-26-16 Jacqlyn Krauss, RN,BSN [phone] Referral made to [facility] for DME- DME to be delivered to room prior to d/c.    1146 12-26-16 Jacqlyn Krauss, RN,BSN [phone] Sister & Aide at the bedside- Aide will provide transportation home. Pt will need DME 02 at home prior to d/c. Order will need to be written. Pt states ok to utilize [facility] in regards to DME 02. Once order written- referral will be sent to [facility] for DME 02. No further needs from CM at this time.     1128 12-26-16 Jacqlyn Krauss, RN,BSN [phone] CM did speak with [facility] and they will provide the RN and PT Services. [facility] to provide the Milrinone and Pam Liaison with [facility] is aware. No further needs from CM at this time.  Bethena Roys, RN 12/22/2016, 1:05 PM

## 2016-12-23 DIAGNOSIS — I482 Chronic atrial fibrillation: Secondary | ICD-10-CM

## 2016-12-23 LAB — CBC WITH DIFFERENTIAL/PLATELET
BASOS ABS: 0 10*3/uL (ref 0.0–0.1)
BASOS PCT: 1 %
Eosinophils Absolute: 1.4 10*3/uL — ABNORMAL HIGH (ref 0.0–0.7)
Eosinophils Relative: 19 %
HEMATOCRIT: 28.3 % — AB (ref 39.0–52.0)
HEMOGLOBIN: 8.5 g/dL — AB (ref 13.0–17.0)
Lymphocytes Relative: 16 %
Lymphs Abs: 1.2 10*3/uL (ref 0.7–4.0)
MCH: 26.8 pg (ref 26.0–34.0)
MCHC: 30 g/dL (ref 30.0–36.0)
MCV: 89.3 fL (ref 78.0–100.0)
MONO ABS: 0.8 10*3/uL (ref 0.1–1.0)
Monocytes Relative: 11 %
NEUTROS ABS: 4.1 10*3/uL (ref 1.7–7.7)
NEUTROS PCT: 53 %
Platelets: 207 10*3/uL (ref 150–400)
RBC: 3.17 MIL/uL — ABNORMAL LOW (ref 4.22–5.81)
RDW: 18.8 % — AB (ref 11.5–15.5)
WBC: 7.5 10*3/uL (ref 4.0–10.5)

## 2016-12-23 LAB — COOXEMETRY PANEL
CARBOXYHEMOGLOBIN: 2.1 % — AB (ref 0.5–1.5)
CARBOXYHEMOGLOBIN: 1.8 % — AB (ref 0.5–1.5)
CARBOXYHEMOGLOBIN: 2 % — AB (ref 0.5–1.5)
METHEMOGLOBIN: 0.7 % (ref 0.0–1.5)
METHEMOGLOBIN: 0.4 % (ref 0.0–1.5)
Methemoglobin: 0.6 % (ref 0.0–1.5)
O2 SAT: 45.6 %
O2 Saturation: 80.1 %
O2 Saturation: 59.9 %
Total hemoglobin: 11.5 g/dL — ABNORMAL LOW (ref 12.0–16.0)
Total hemoglobin: 9.6 g/dL — ABNORMAL LOW (ref 12.0–16.0)
Total hemoglobin: 9.2 g/dL — ABNORMAL LOW (ref 12.0–16.0)

## 2016-12-23 LAB — BASIC METABOLIC PANEL
ANION GAP: 8 (ref 5–15)
BUN: 57 mg/dL — ABNORMAL HIGH (ref 6–20)
CHLORIDE: 94 mmol/L — AB (ref 101–111)
CO2: 29 mmol/L (ref 22–32)
Calcium: 8.5 mg/dL — ABNORMAL LOW (ref 8.9–10.3)
Creatinine, Ser: 2.18 mg/dL — ABNORMAL HIGH (ref 0.61–1.24)
GFR, EST AFRICAN AMERICAN: 33 mL/min — AB (ref >60)
GFR, EST NON AFRICAN AMERICAN: 28 mL/min — AB (ref >60)
Glucose, Bld: 84 mg/dL (ref 65–99)
POTASSIUM: 3.7 mmol/L (ref 3.5–5.1)
SODIUM: 131 mmol/L — AB (ref 135–145)

## 2016-12-23 LAB — PROTIME-INR
INR: 2.01
PROTHROMBIN TIME: 23 s — AB (ref 11.4–15.2)

## 2016-12-23 LAB — MAGNESIUM: Magnesium: 2.5 mg/dL — ABNORMAL HIGH (ref 1.7–2.4)

## 2016-12-23 MED ORDER — WARFARIN SODIUM 5 MG PO TABS
5.0000 mg | ORAL_TABLET | Freq: Once | ORAL | Status: AC
  Administered 2016-12-23: 5 mg via ORAL
  Filled 2016-12-23: qty 1

## 2016-12-23 MED ORDER — MILRINONE LACTATE IN DEXTROSE 20-5 MG/100ML-% IV SOLN
0.2500 ug/kg/min | INTRAVENOUS | Status: DC
  Administered 2016-12-23 – 2016-12-27 (×6): 0.25 ug/kg/min via INTRAVENOUS
  Filled 2016-12-23 (×6): qty 100

## 2016-12-23 MED ORDER — AMIODARONE HCL 200 MG PO TABS
200.0000 mg | ORAL_TABLET | Freq: Two times a day (BID) | ORAL | Status: DC
  Administered 2016-12-23 – 2016-12-25 (×6): 200 mg via ORAL
  Filled 2016-12-23 (×6): qty 1

## 2016-12-23 MED ORDER — METOLAZONE 5 MG PO TABS
5.0000 mg | ORAL_TABLET | Freq: Once | ORAL | Status: AC
  Administered 2016-12-23: 5 mg via ORAL
  Filled 2016-12-23: qty 1

## 2016-12-23 MED ORDER — POTASSIUM CHLORIDE CRYS ER 20 MEQ PO TBCR
40.0000 meq | EXTENDED_RELEASE_TABLET | Freq: Every day | ORAL | Status: DC
  Administered 2016-12-23 – 2016-12-27 (×5): 40 meq via ORAL
  Filled 2016-12-23 (×5): qty 2

## 2016-12-23 MED ORDER — FUROSEMIDE 10 MG/ML IJ SOLN
120.0000 mg | Freq: Two times a day (BID) | INTRAVENOUS | Status: DC
  Administered 2016-12-23 – 2016-12-25 (×5): 120 mg via INTRAVENOUS
  Filled 2016-12-23: qty 10
  Filled 2016-12-23 (×2): qty 12
  Filled 2016-12-23: qty 10
  Filled 2016-12-23 (×2): qty 12

## 2016-12-23 NOTE — Progress Notes (Signed)
[place] for Coumadin Indication: atrial fibrillation  Allergies  Allergen Reactions  . Lidocaine Other (See Comments)    Passed out - at the dentist's office  . Procaine Hcl Other (See Comments)    Passed out - at the dentist's office    Patient Measurements: Height: 6\' 2"  (188 cm) Weight: 163 lb (73.9 kg) IBW/kg (Calculated) : 82.2 Heparin Dosing Weight: n/a  Vital Signs: Temp: 97.7 F (36.5 C) (07/28 1225) Temp Source: Oral (07/28 1225) BP: 97/71 (07/28 1225) Pulse Rate: 83 (07/28 1225)  Labs:  Recent Labs  12/21/16 1153 12/21/16 2324 12/22/16 0523 12/23/16 0410  HGB 9.7*  --  8.9* 8.5*  HCT 31.6*  --  28.5* 28.3*  PLT 240  --  207 207  LABPROT 19.1*  --  20.7* 23.0*  INR 1.59  --  1.75 2.01  CREATININE 2.45*  --  2.31* 2.18*  TROPONINI  --  0.11* 0.10*  --     Estimated Creatinine Clearance: 31.1 mL/min (A) (by C-G formula based on SCr of 2.18 mg/dL (H)).  Assessment: 76 yo male on chronic Coumadin for afib. INR 1.6 low on admit but now trend up 2 at goal. Hgb down slightly from 9.7>8.5, no bleeding issues noted.  Patient out patient warfarin dosing has combination of 3/6mg  - recently changed post d/c from Rehab.  Patient only has warfarin 5mg  tablets at home and has been taking 5mg  daily.   He has a large full bottle of warfarin 5mg  and wants to use those tablets at DC.  Will most liekly need combo of 5mg /7.5mg  doses at DC  Goal of Therapy:  INR 2-3 Monitor platelets by anticoagulation protocol: Yes   Plan:  1. Coumadin 5mg  tonight 2. Daily INR.  Bonnita Nasuti Pharm.D. CPP, BCPS Clinical Pharmacist [phone] 12/23/2016 1:54 PM

## 2016-12-23 NOTE — Progress Notes (Addendum)
Advanced Heart Failure Rounding Note  PCP: Clyde Lundborg Primary Cardiologist: Dr. Martinique HF Cardiology: Dr. [name]  Subjective:    Admitted with volume overload, concern for low output HF. Initial co ox 60%.   Remains on IV lasix. Got metolazone 5mg  yesterday. Weight unchanged. Co-ox 80% (? Accuracy). CVP 13->15  Says he feels much better. Denies dyspnea, orthopnea or PND. Wants to go home.   Repeat echo 7/27 EF 10-15% RV moderately HK  Objective:   Weight Range: 73.9 kg (163 lb) Body mass index is 20.93 kg/m.   Vital Signs:   Temp:  [97.4 F (36.3 C)-98.1 F (36.7 C)] 98.1 F (36.7 C) (07/28 0745) Pulse Rate:  [86-107] 86 (07/28 0900) Resp:  [18-19] 19 (07/28 0745) BP: (88-107)/(62-79) 107/79 (07/28 0900) SpO2:  [90 %-100 %] 98 % (07/28 0745) Weight:  [73.9 kg (163 lb)] 73.9 kg (163 lb) (07/28 0647) Last BM Date: 12/21/16  Weight change: Filed Weights   12/21/16 2000 12/22/16 0659 12/23/16 0647  Weight: 75.5 kg (166 lb 8 oz) 74.3 kg (163 lb 12.8 oz) 73.9 kg (163 lb)    Intake/Output:   Intake/Output Summary (Last 24 hours) at 12/23/16 1235 Last data filed at 12/23/16 0043  Gross per 24 hour  Intake              600 ml  Output             1200 ml  Net             -600 ml      Physical Exam  CVP 15 General:  Lying in bed Chronically ill appearing.  No resp difficulty HEENT: normal Neck: supple.JVP to ear Carotids 2+ bilat; no bruits. No lymphadenopathy or thryomegaly appreciated. Cor: PMI laterally displaced. IRR IRR +s3 Lungs: basilar crackles  Abdomen: soft, nontender, nondistended. No hepatosplenomegaly. No bruits or masses. Good bowel sounds. Extremities: no cyanosis, clubbing, rash, edema Neuro: alert & orientedx3, cranial nerves grossly intact. moves all 4 extremities w/o difficulty. Affect flat   Telemetry   Afib 90s with frequent polymorphic PVCs personally reviewed.   EKG    N/A   Labs    CBC  Recent Labs  12/22/16 0523  12/23/16 0410  WBC 7.7 7.5  NEUTROABS 4.3 4.1  HGB 8.9* 8.5*  HCT 28.5* 28.3*  MCV 89.3 89.3  PLT 207 774   Basic Metabolic Panel  Recent Labs  12/22/16 0523 12/23/16 0410  NA 136 131*  K 4.5 3.7  CL 98* 94*  CO2 27 29  GLUCOSE 98 84  BUN 52* 57*  CREATININE 2.31* 2.18*  CALCIUM 8.7* 8.5*  MG  --  2.5*   Liver Function Tests  Recent Labs  12/21/16 1153 12/22/16 0523  AST 52* 43*  ALT 26 26  ALKPHOS 88 78  BILITOT 1.2 1.0  PROT 5.9* 5.5*  ALBUMIN 2.9* 2.8*   No results for input(s): LIPASE, AMYLASE in the last 72 hours. Cardiac Enzymes  Recent Labs  12/21/16 2324 12/22/16 0523  TROPONINI 0.11* 0.10*    BNP: BNP (last 3 results)  Recent Labs  12/04/16 1128 12/14/16 1436 12/21/16 1154  BNP 1,974.7* 1,024.0* 3,403.3*    ProBNP (last 3 results) No results for input(s): PROBNP in the last 8760 hours.   D-Dimer No results for input(s): DDIMER in the last 72 hours. Hemoglobin A1C No results for input(s): HGBA1C in the last 72 hours. Fasting Lipid Panel No results for input(s): CHOL, HDL, LDLCALC, TRIG,  CHOLHDL, LDLDIRECT in the last 72 hours. Thyroid Function Tests No results for input(s): TSH, T4TOTAL, T3FREE, THYROIDAB in the last 72 hours.  Invalid input(s): FREET3  Other results:   Imaging    No results found.   Medications:     Scheduled Medications: . allopurinol  100 mg Oral Daily  . atorvastatin  40 mg Oral q morning - 10a  . feeding supplement (ENSURE ENLIVE)  237 mL Oral QID  . furosemide  80 mg Intravenous BID  . potassium chloride  40 mEq Oral Daily  . sodium chloride flush  10-40 mL Intracatheter Q12H  . warfarin  5 mg Oral ONCE-1800  . Warfarin - Pharmacist Dosing Inpatient   Does not apply q1800    Infusions:   PRN Medications: acetaminophen, ALPRAZolam, nitroGLYCERIN, ondansetron (ZOFRAN) IV, sodium chloride flush    Patient Profile   Jason Choi is a 76 y.o. male with h/o CAD s/p CABG, systolic  due to ischemic cardiomyopathy Echo 07/2016 LVEF 15% s/p ICD implant 09/2016 - Medtronic, and permanent Afib  Admitted 12/21/16 with worsening SOB and concerns for low output  Assessment/Plan   1. 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 Echo (07/28/16) LVEF 15%, severe LV dilation, severe MR, mild RV dysfunction, Mod RAE, PA peak pressure 49 mm Hg. This is worsened from 6/14 echo with EF 35-40%. S/P Medtronic Single Chamber ICD. Activity less than 1 hour per day. Repeat echo 12/23/15 reviewed personally EF 10-15% moderate RV Hk - Had low output HF in 7/18 with co-ox 44% - Symptoms improved but remains very tenuous with high CVP.Minimal response to IV lasix and metolazone. Echo reviewed personally and shows ongoing severe biventricular dysfunction.  - Increase lasix to 120IV bid. Repeat metolazone  - Co ox likely inaccurate. Will repeat. Have been holding off on milrinone with adequate co-ox. Given trajectory will likely need home inotropes or Palliative Care. I suspect latter may be better option for him - No ACE/ARB/Dig/Spiro with CKD.  - Holding off on beta blocker.  - No ACEI/ARB/ARNI or spironolactone for now with CKD stage IV.  No digoxin with CKD IV.  - With deconditioning, CKD, and foot ulcer, not a candidate for LVAD.   2. Syncope. - had episode in 2/18 (prior to ICD) - No driving until 01/9370.  - He has had no recurrent syncope.   3. CAD: S/p CABG in 2002.  - No s/s ischemia.  - No ASA with stable CAD and coumadin.   4. CKD: Stage III-IV - Baseline 2.0 - 2.2 - Follow closely with diuresis. Creatinine improved today 2.3->2.1 - Not good dialysis candiate with likely end-stage CHF.  - No change.   5. Permanent Afib; failed amiodarone and DCCV.  - rates in the 90-100's. No beta blocker with decompensation.  - Continue coumadin for anticoagulation   6. Hypokalemia - will supp  7. Frequent PVCs with occasional NSVT - averaging 15-20  PVCs/min (~20%). I wonder if this is contributing to HF?  - Keep K >4.0 Mg > 2.0. Not candidate for b-blocker with low output  - Was on amio in past for AF. Will restart po amio.    8. Right foot ulceration - Stable  9. 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  - Continue colchicine and allopurinol.  - No change.   10. Deconditioning:  - Will have cardiac rehab see.  - No change.   11. ETOH abuse  - Encouraged complete cessation.  - He  says he has not had a drink in 3 weeks.   12. Subtherapeutic INR - Calls medication compliance into question. Especially given tendency to leave hospital prior to completion of evaluation/treatment - INR 2.01 today. D/w with PharmD  13. Anxiety - Improved with Xanax 0.25 mg BID prn.    Length of Stay: 2   Glori Bickers, MD  12/23/2016, 12:35 PM  Advanced Heart Failure Team Pager [phone] (M-F; 7a - 4p)  Please contact [facility] for night-coverage after hours (4p -7a ) and weekends on [email or web address]   Addendum:  Repeat co-ox 45%. Start milrinone.  D/w PharmD.   Glori Bickers, MD  1:37 PM

## 2016-12-23 NOTE — Progress Notes (Addendum)
Pt stated he tried to void and when he sat on the side of the bed he got "dizzy and only peed just a tiny bit." Pt feels as if we have "dried him out" and he has nothing left to give fluid wise. Pt requesting more PO fluids despite constant re-education as to why he can't drink as much as he wants. Co-Ox re-drawn and sent per order after initiation of milrinone. Results and above concerns given to Dr. Haroldine Laws while he was on floor - no new orders received. Will continue to monitor closely.

## 2016-12-24 LAB — BASIC METABOLIC PANEL
ANION GAP: 9 (ref 5–15)
BUN: 59 mg/dL — ABNORMAL HIGH (ref 6–20)
CHLORIDE: 91 mmol/L — AB (ref 101–111)
CO2: 31 mmol/L (ref 22–32)
Calcium: 8.7 mg/dL — ABNORMAL LOW (ref 8.9–10.3)
Creatinine, Ser: 2.1 mg/dL — ABNORMAL HIGH (ref 0.61–1.24)
GFR calc non Af Amer: 29 mL/min — ABNORMAL LOW (ref >60)
GFR, EST AFRICAN AMERICAN: 34 mL/min — AB (ref >60)
Glucose, Bld: 129 mg/dL — ABNORMAL HIGH (ref 65–99)
POTASSIUM: 3.2 mmol/L — AB (ref 3.5–5.1)
SODIUM: 131 mmol/L — AB (ref 135–145)

## 2016-12-24 LAB — CBC WITH DIFFERENTIAL/PLATELET
BASOS ABS: 0 10*3/uL (ref 0.0–0.1)
BASOS PCT: 1 %
Eosinophils Absolute: 1.3 10*3/uL — ABNORMAL HIGH (ref 0.0–0.7)
Eosinophils Relative: 16 %
HEMATOCRIT: 27.4 % — AB (ref 39.0–52.0)
Hemoglobin: 8.4 g/dL — ABNORMAL LOW (ref 13.0–17.0)
LYMPHS PCT: 11 %
Lymphs Abs: 0.9 10*3/uL (ref 0.7–4.0)
MCH: 27 pg (ref 26.0–34.0)
MCHC: 30.7 g/dL (ref 30.0–36.0)
MCV: 88.1 fL (ref 78.0–100.0)
MONO ABS: 0.8 10*3/uL (ref 0.1–1.0)
Monocytes Relative: 10 %
NEUTROS ABS: 4.8 10*3/uL (ref 1.7–7.7)
NEUTROS PCT: 62 %
Platelets: 202 10*3/uL (ref 150–400)
RBC: 3.11 MIL/uL — AB (ref 4.22–5.81)
RDW: 18.4 % — AB (ref 11.5–15.5)
WBC: 7.7 10*3/uL (ref 4.0–10.5)

## 2016-12-24 LAB — COOXEMETRY PANEL
CARBOXYHEMOGLOBIN: 2.2 % — AB (ref 0.5–1.5)
METHEMOGLOBIN: 0.7 % (ref 0.0–1.5)
O2 SAT: 57.6 %
Total hemoglobin: 8.7 g/dL — ABNORMAL LOW (ref 12.0–16.0)

## 2016-12-24 LAB — PROTIME-INR
INR: 2.15
PROTHROMBIN TIME: 24.3 s — AB (ref 11.4–15.2)

## 2016-12-24 MED ORDER — WARFARIN SODIUM 5 MG PO TABS
5.0000 mg | ORAL_TABLET | Freq: Once | ORAL | Status: AC
  Administered 2016-12-24: 5 mg via ORAL
  Filled 2016-12-24: qty 1

## 2016-12-24 MED ORDER — POTASSIUM CHLORIDE CRYS ER 20 MEQ PO TBCR
40.0000 meq | EXTENDED_RELEASE_TABLET | Freq: Once | ORAL | Status: AC
Start: 2016-12-24 — End: 2016-12-24
  Administered 2016-12-24: 40 meq via ORAL
  Filled 2016-12-24: qty 2

## 2016-12-24 NOTE — Progress Notes (Signed)
[place] for Coumadin Indication: atrial fibrillation  Allergies  Allergen Reactions  . Lidocaine Other (See Comments)    Passed out - at the dentist's office  . Procaine Hcl Other (See Comments)    Passed out - at the dentist's office    Patient Measurements: Height: 6\' 2"  (188 cm) Weight: 161 lb 8 oz (73.3 kg) IBW/kg (Calculated) : 82.2 Heparin Dosing Weight: n/a  Vital Signs: Temp: 97.6 F (36.4 C) (07/29 0941) Temp Source: Oral (07/29 0941) BP: 100/72 (07/29 0941) Pulse Rate: 87 (07/29 0941)  Labs:  Recent Labs  12/21/16 2324 12/22/16 0523 12/23/16 0410 12/24/16 0500  HGB  --  8.9* 8.5* 8.4*  HCT  --  28.5* 28.3* 27.4*  PLT  --  207 207 202  LABPROT  --  20.7* 23.0* 24.3*  INR  --  1.75 2.01 2.15  CREATININE  --  2.31* 2.18* 2.10*  TROPONINI 0.11* 0.10*  --   --     Estimated Creatinine Clearance: 32 mL/min (A) (by C-G formula based on SCr of 2.1 mg/dL (H)).  Assessment: 76 yo male on chronic Coumadin for afib. INR 1.6 low on admit but now trend up 2.1 at goal. Hgb down slightly from 9.7>8.5, no bleeding issues noted. Restarted po amiodarone 200mg  BID 7/28  Patient out patient warfarin dosing has combination of 3/6mg  - recently changed post d/c from Rehab.  Patient only has warfarin 5mg  tablets at home and has been taking 5mg  daily.   He has a large full bottle of warfarin 5mg  and wants to use those tablets at DC.  Will most likely need combo of 5mg /7.5mg  doses at DC  Goal of Therapy:  INR 2-3 Monitor platelets by anticoagulation protocol: Yes   Plan:  1. Coumadin 5mg  tonight 2. Daily INR.  Bonnita Nasuti Pharm.D. CPP, BCPS Clinical Pharmacist [phone] 12/24/2016 10:59 AM

## 2016-12-24 NOTE — Progress Notes (Addendum)
Advanced Heart Failure Rounding Note  PCP: Clyde Lundborg Primary Cardiologist: Dr. Martinique HF Cardiology: Dr. [name]  Subjective:    Admitted with volume overload, concern for low output HF. Initial co ox 60%.   Started on milrinone on 7/28 for low output with co-ox 45%. Co-ox 58% today. Diuresis improved. Down 2 pounds overnight. Creatinine 2.18 -> 2.10. Remains on lasix 120 IV bid. Feeling better. Still mildly dyspneic. CVP 8-9. Anxious to go home.   Started on po amio for frequent PVCs and NSVT on 7/28. Had 10 beat run NSVT overnight.   Repeat echo 7/27 EF 10-15% RV moderately HK  Objective:   Weight Range: 73.3 kg (161 lb 8 oz) Body mass index is 20.74 kg/m.   Vital Signs:   Temp:  [97.6 F (36.4 C)-98 F (36.7 C)] 98 F (36.7 C) (07/29 1207) Pulse Rate:  [45-93] 93 (07/29 1207) Resp:  [16-22] 16 (07/29 1207) BP: (94-117)/(60-75) 98/69 (07/29 1207) SpO2:  [92 %-97 %] 94 % (07/29 1207) Weight:  [73.3 kg (161 lb 8 oz)] 73.3 kg (161 lb 8 oz) (07/29 0400) Last BM Date: 12/23/16  Weight change: Filed Weights   12/22/16 0659 12/23/16 0647 12/24/16 0400  Weight: 74.3 kg (163 lb 12.8 oz) 73.9 kg (163 lb) 73.3 kg (161 lb 8 oz)    Intake/Output:   Intake/Output Summary (Last 24 hours) at 12/24/16 1403 Last data filed at 12/24/16 1332  Gross per 24 hour  Intake          1225.16 ml  Output             1475 ml  Net          -249.84 ml      Physical Exam   CVP 8-9 General:  Lying in bed. No resp difficulty HEENT: normal Neck: supple. JVP 9. Carotids 2+ bilat; no bruits. No lymphadenopathy or thryomegaly appreciated. Cor: PMI  laterally displaced. IRR IRR  +s3 Lungs: clear  Abdomen: soft, nontender, nondistended. No hepatosplenomegaly. No bruits or masses. Good bowel sounds. Extremities: no cyanosis, clubbing, rash, edema Neuro: alert & orientedx3, cranial nerves grossly intact. moves all 4 extremities w/o difficulty. Affect pleasant   Telemetry   Afib  90s with frequent polymorphic PVCs and 10 beat NSVT personally reviewed.   EKG    N/A   Labs    CBC  Recent Labs  12/23/16 0410 12/24/16 0500  WBC 7.5 7.7  NEUTROABS 4.1 4.8  HGB 8.5* 8.4*  HCT 28.3* 27.4*  MCV 89.3 88.1  PLT 207 614   Basic Metabolic Panel  Recent Labs  12/23/16 0410 12/24/16 0500  NA 131* 131*  K 3.7 3.2*  CL 94* 91*  CO2 29 31  GLUCOSE 84 129*  BUN 57* 59*  CREATININE 2.18* 2.10*  CALCIUM 8.5* 8.7*  MG 2.5*  --    Liver Function Tests  Recent Labs  12/22/16 0523  AST 43*  ALT 26  ALKPHOS 78  BILITOT 1.0  PROT 5.5*  ALBUMIN 2.8*   No results for input(s): LIPASE, AMYLASE in the last 72 hours. Cardiac Enzymes  Recent Labs  12/21/16 2324 12/22/16 0523  TROPONINI 0.11* 0.10*    BNP: BNP (last 3 results)  Recent Labs  12/04/16 1128 12/14/16 1436 12/21/16 1154  BNP 1,974.7* 1,024.0* 3,403.3*    ProBNP (last 3 results) No results for input(s): PROBNP in the last 8760 hours.   D-Dimer No results for input(s): DDIMER in the last 72 hours. Hemoglobin  A1C No results for input(s): HGBA1C in the last 72 hours. Fasting Lipid Panel No results for input(s): CHOL, HDL, LDLCALC, TRIG, CHOLHDL, LDLDIRECT in the last 72 hours. Thyroid Function Tests No results for input(s): TSH, T4TOTAL, T3FREE, THYROIDAB in the last 72 hours.  Invalid input(s): FREET3  Other results:   Imaging    No results found.   Medications:     Scheduled Medications: . allopurinol  100 mg Oral Daily  . amiodarone  200 mg Oral BID  . atorvastatin  40 mg Oral q morning - 10a  . feeding supplement (ENSURE ENLIVE)  237 mL Oral QID  . potassium chloride  40 mEq Oral Daily  . sodium chloride flush  10-40 mL Intracatheter Q12H  . warfarin  5 mg Oral ONCE-1800  . Warfarin - Pharmacist Dosing Inpatient   Does not apply q1800    Infusions: . furosemide Stopped (12/24/16 0941)  . milrinone 0.25 mcg/kg/min (12/24/16 0549)    PRN  Medications: acetaminophen, ALPRAZolam, nitroGLYCERIN, ondansetron (ZOFRAN) IV, sodium chloride flush    Patient Profile   Jason Choi is a 76 y.o. male with h/o CAD s/p CABG, systolic due to ischemic cardiomyopathy Echo 07/2016 LVEF 15% s/p ICD implant 09/2016 - Medtronic, and permanent Afib  Admitted 12/21/16 with worsening SOB and concerns for low output  Assessment/Plan   1. 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 Echo (07/28/16) LVEF 15%, severe LV dilation, severe MR, mild RV dysfunction, Mod RAE, PA peak pressure 49 mm Hg. This is worsened from 6/14 echo with EF 35-40%. S/P Medtronic Single Chamber ICD. Activity less than 1 hour per day. Repeat echo 12/23/15 reviewed personally EF 10-15% moderate RV Hk - Continues to struggle with biventricular failure. Milrinone started on 7/28. Co-ox improved but still marginal.   - Volume status improving. CVP 8-9. Continue lasix to 120IV bid one more day.   - Given trajectory will likely need home inotropes or Palliative Care. I suspect latter may be better option for him but he seems open to trying milrinone at home. He will discuss with Dr. [name] tomorrow. If decides on home milrinone will need change of PICC line to single lumen - No ACE/ARB/Dig/Spiro with CKD.  - Holding off on beta blocker with low output  - With deconditioning, CKD, and foot ulcer, not a candidate for LVAD.   2. Syncope. - had episode in 2/18 (prior to ICD) - No driving until 05/8561.  - He has had no recurrent syncope.   3. CAD: S/p CABG in 2002.  - No s/s ischemia.  - No ASA with stable CAD and coumadin.   4. CKD: Stage III-IV - Baseline 2.0 - 2.2 - Follow closely with diuresis. Creatinine stable at 2.1 - Not good dialysis candiate with likely end-stage CHF.  - No change.   5. Permanent Afib; failed amiodarone and DCCV.  - rates in the 90-100's. No beta blocker with decompensation.  - Continue coumadin for  anticoagulation   6. Hypokalemia - will supp with 40 bid today  7. Frequent PVCs with occasional NSVT - averaging >20 PVCs/min (~20%). I wonder if this is contributing to HF?  - amio 200 bid restarted on 7/28. Will continue.  - Keep K >4.0 Mg > 2.0. Not candidate for b-blocker with low output   8. Right foot ulceration - Stable  9. 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  - Continue colchicine and allopurinol.  - No change.   10. Deconditioning:  -  Will have cardiac rehab see.  - No change.   11. ETOH abuse  - Encouraged complete cessation.  - He says he has not had a drink in 3 weeks.   12. Subtherapeutic INR - Calls medication compliance into question. Especially given tendency to leave hospital prior to completion of evaluation/treatment - INR 2.15 today. D/w with PharmD  13. Anxiety - Improved with Xanax 0.25 mg BID prn.    Length of Stay: 3   Glori Bickers, MD  12/24/2016, 2:03 PM  Advanced Heart Failure Team Pager [phone] (M-F; 7a - 4p)  Please contact [facility] for night-coverage after hours (4p -7a ) and weekends on [email or web address]

## 2016-12-25 ENCOUNTER — Inpatient Hospital Stay (HOSPITAL_COMMUNITY): Admission: RE | Admit: 2016-12-25 | Payer: Medicare Other | Source: Ambulatory Visit

## 2016-12-25 LAB — BASIC METABOLIC PANEL
Anion gap: 10 (ref 5–15)
BUN: 61 mg/dL — ABNORMAL HIGH (ref 6–20)
CALCIUM: 9 mg/dL (ref 8.9–10.3)
CHLORIDE: 90 mmol/L — AB (ref 101–111)
CO2: 34 mmol/L — AB (ref 22–32)
Creatinine, Ser: 1.97 mg/dL — ABNORMAL HIGH (ref 0.61–1.24)
GFR calc Af Amer: 37 mL/min — ABNORMAL LOW (ref >60)
GFR calc non Af Amer: 32 mL/min — ABNORMAL LOW (ref >60)
GLUCOSE: 131 mg/dL — AB (ref 65–99)
Potassium: 3.8 mmol/L (ref 3.5–5.1)
Sodium: 134 mmol/L — ABNORMAL LOW (ref 135–145)

## 2016-12-25 LAB — CBC WITH DIFFERENTIAL/PLATELET
BASOS ABS: 0.1 10*3/uL (ref 0.0–0.1)
Basophils Relative: 1 %
Eosinophils Absolute: 1.3 10*3/uL — ABNORMAL HIGH (ref 0.0–0.7)
Eosinophils Relative: 18 %
HEMATOCRIT: 26.4 % — AB (ref 39.0–52.0)
HEMOGLOBIN: 8.2 g/dL — AB (ref 13.0–17.0)
LYMPHS ABS: 1.1 10*3/uL (ref 0.7–4.0)
LYMPHS PCT: 15 %
MCH: 27.5 pg (ref 26.0–34.0)
MCHC: 31.1 g/dL (ref 30.0–36.0)
MCV: 88.6 fL (ref 78.0–100.0)
Monocytes Absolute: 0.9 10*3/uL (ref 0.1–1.0)
Monocytes Relative: 13 %
NEUTROS ABS: 3.8 10*3/uL (ref 1.7–7.7)
NEUTROS PCT: 53 %
Platelets: 201 10*3/uL (ref 150–400)
RBC: 2.98 MIL/uL — AB (ref 4.22–5.81)
RDW: 18.5 % — ABNORMAL HIGH (ref 11.5–15.5)
WBC: 7.1 10*3/uL (ref 4.0–10.5)

## 2016-12-25 LAB — COOXEMETRY PANEL
CARBOXYHEMOGLOBIN: 2.1 % — AB (ref 0.5–1.5)
Methemoglobin: 0.5 % (ref 0.0–1.5)
O2 Saturation: 59.3 %
Total hemoglobin: 8.6 g/dL — ABNORMAL LOW (ref 12.0–16.0)

## 2016-12-25 LAB — PROTIME-INR
INR: 2.19
Prothrombin Time: 24.8 seconds — ABNORMAL HIGH (ref 11.4–15.2)

## 2016-12-25 MED ORDER — METOLAZONE 5 MG PO TABS
2.5000 mg | ORAL_TABLET | Freq: Once | ORAL | Status: AC
  Administered 2016-12-25: 2.5 mg via ORAL
  Filled 2016-12-25: qty 1

## 2016-12-25 MED ORDER — WARFARIN SODIUM 5 MG PO TABS
5.0000 mg | ORAL_TABLET | Freq: Every day | ORAL | Status: DC
  Administered 2016-12-25: 5 mg via ORAL
  Filled 2016-12-25: qty 1

## 2016-12-25 MED ORDER — POTASSIUM CHLORIDE CRYS ER 20 MEQ PO TBCR
40.0000 meq | EXTENDED_RELEASE_TABLET | Freq: Once | ORAL | Status: AC
  Administered 2016-12-25: 40 meq via ORAL
  Filled 2016-12-25: qty 2

## 2016-12-25 NOTE — Consult Note (Addendum)
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  He was due for an appointment today, which he will not be able to attend since he remains in the hospital without any plans for discharge.  He is well-informed regarding topical treatment.  This dressing is not available in the [facility] inpatient formulary.  Removed outer dressings and changed 2X2 gauze, kerlex, and coban.  Protective dressing in place over Oasis site with steri strips, this was not disturbed. Area is .5X.5cm and yellow, with small amt yellow drainage, no odor. New dressing should remain in place until next Monday.  Pt states he will follow-up at the outpatient wound center and denies further questions. Please re-consult if further assistance is needed.  Thank-you,  Julien Girt MSN, [place], [place], [place], [place]

## 2016-12-25 NOTE — Care Management Important Message (Signed)
Important Message  Patient Details  Name: Jason Choi MRN: [id number] Date of Birth: Mar 08, 1941   Medicare Important Message Given:  Yes    [name] 12/25/2016, 2:25 PM

## 2016-12-25 NOTE — Evaluation (Signed)
Physical Therapy Evaluation Patient Details Name: Jason Choi MRN: [id number] DOB: [birth year] Today's Date: 12/25/2016   History of Present Illness  Patient is a 76 y/o male who presents with SOB. 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 CXR-pulmonary edema. PMH includes CHF, polio, ischemic cardiomyopathy, right foot wound, CAD s/p CABG, MI, AICD, CKD, A-fib  Clinical Impression  Patient presents with dyspnea on exertion, generalized weakness, impaired balance and decreased endurance s/p above. Tolerated gait training with Min A for balance/safety. Sp02 dropped to 82% on 2L/min 02. Education re: pursed lip breathing and energy conservation techniques. Pt lives alone and Mod I PTA using SPC, still driving. Encouraged pt to increase activity while in hospital. Will follow acutely to maximize independence and mobility prior to return home. Will need to start to wean 02.    Follow Up Recommendations Home health PT;Supervision for mobility/OOB;Supervision/Assistance - 24 hour    Equipment Recommendations  None recommended by PT    Recommendations for Other Services OT consult     Precautions / Restrictions Precautions Precautions: Fall Precaution Comments: watch 02; Rt foot wound Restrictions Weight Bearing Restrictions: No      Mobility  Bed Mobility Overal bed mobility: Needs Assistance Bed Mobility: Supine to Sit;Sit to Supine     Supine to sit: Modified independent (Device/Increase time);HOB elevated Sit to supine: Modified independent (Device/Increase time);HOB elevated   General bed mobility comments: No assist needed for bed mobility. Use of rails.  Transfers Overall transfer level: Needs assistance Equipment used: None Transfers: Sit to/from Stand Sit to Stand: Min guard         General transfer comment: Min guard for safety. Stood from [employer]. No dizziness. Reaching for IV pole for support.   Ambulation/Gait Ambulation/Gait assistance: Min  assist Ambulation Distance (Feet): 140 Feet Assistive device: Rolling walker (2 wheeled) Gait Pattern/deviations: Step-through pattern;Decreased stride length;Trunk flexed Gait velocity: decreased Gait velocity interpretation: <1.8 ft/sec, indicative of risk for recurrent falls General Gait Details: Slow, unsteady gait. 3-4/4 DOE. Sp02 dropped to 82% on 2L/min 02. 1 forced standing rest break.   Stairs            Wheelchair Mobility    Modified Rankin (Stroke Patients Only)       Balance Overall balance assessment: Needs assistance Sitting-balance support: Feet supported;No upper extremity supported Sitting balance-Leahy Scale: Good     Standing balance support: During functional activity Standing balance-Leahy Scale: Fair Standing balance comment: Able to stand statically without UE support but requires UE support for dynamic standing/gait.                             Pertinent Vitals/Pain Pain Assessment: Faces Faces Pain Scale: Hurts a little bit Pain Location: right foot wound Pain Descriptors / Indicators: Aching;Sore Pain Intervention(s): Monitored during session;Repositioned    Home Living Family/patient expects to be discharged to:: Private residence Living Arrangements: Alone Available Help at Discharge: Friend(s);Available PRN/intermittently Type of Home: House Home Access: Stairs to enter Entrance Stairs-Rails: Psychiatric nurse of Steps: 3 Home Layout: One level Home Equipment: Grab bars - tub/shower;Walker - 2 wheels;Cane - single point;Wheelchair - manual      Prior Function Level of Independence: Independent with assistive device(s)         Comments: 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 Drives. Caregiver assists as needed. Uses SPC PTA.      Hand Dominance   Dominant Hand: Right    Extremity/Trunk Assessment  Upper Extremity Assessment Upper Extremity Assessment: Defer to OT evaluation    Lower Extremity  Assessment Lower Extremity Assessment: RLE deficits/detail;Generalized weakness RLE Deficits / Details: Rt foot wound not visualized but being followed by WOC.        Communication   Communication: No difficulties  Cognition Arousal/Alertness: Awake/alert Behavior During Therapy: WFL for tasks assessed/performed Overall Cognitive Status: Within Functional Limits for tasks assessed                                 General Comments: Pt admittedly stubborn.       General Comments General comments (skin integrity, edema, etc.): HR ranging from 90s-100s bpm.    Exercises     Assessment/Plan    PT Assessment Patient needs continued PT services  PT Problem List Decreased strength;Decreased mobility;Decreased safety awareness;Pain;Decreased balance;Cardiopulmonary status limiting activity;Decreased activity tolerance       PT Treatment Interventions Therapeutic activities;Therapeutic exercise;Gait training;Patient/family education;Balance training;Functional mobility training;Stair training    PT Goals (Current goals can be found in the Care Plan section)  Acute Rehab PT Goals Patient Stated Goal: to return home ASAP PT Goal Formulation: With patient Time For Goal Achievement: 01/08/17 Potential to Achieve Goals: Fair    Frequency Min 3X/week   Barriers to discharge Decreased caregiver support;Inaccessible home environment      Co-evaluation               AM-PAC PT "6 Clicks" Daily Activity  Outcome Measure Difficulty turning over in bed (including adjusting bedclothes, sheets and blankets)?: None Difficulty moving from lying on back to sitting on the side of the bed? : None Difficulty sitting down on and standing up from a chair with arms (e.g., wheelchair, bedside commode, etc,.)?: None Help needed moving to and from a bed to chair (including a wheelchair)?: A Little Help needed walking in hospital room?: A Little Help needed climbing 3-5 steps with a  railing? : A Lot 6 Click Score: 20    End of Session Equipment Utilized During Treatment: Oxygen;Gait belt Activity Tolerance: Treatment limited secondary to medical complications (Comment) (drop in Sp02) Patient left: in bed;with call bell/phone within reach;with bed alarm set Nurse Communication: Mobility status PT Visit Diagnosis: Unsteadiness on feet (R26.81);Other (comment);Muscle weakness (generalized) (M62.81) (dyspnea)    Time: 0932-6712 PT Time Calculation (min) (ACUTE ONLY): 34 min   Charges:   PT Evaluation $PT Eval Moderate Complexity: 1 Mod PT Treatments $Gait Training: 8-22 mins   PT G Codes:        Wray Kearns, PT, DPT [phone]    Marguarite Arbour A Alfons Sulkowski 12/25/2016, 2:45 PM

## 2016-12-25 NOTE — Progress Notes (Signed)
Advanced Heart Failure Rounding Note  PCP: Clyde Lundborg Primary Cardiologist: Dr. Martinique HF Cardiology: Dr. [name]  Subjective:    Admitted 12/21/16 with volume overload, concern for low output HF.  Started on milrinone on 7/28 for low output with co-ox 45%. Co ox 59% today. Remains on milrinone 0.25 mcg and IV lasix 120 mg BID. Weight down 4 pounds total. Creatinine 2.45->2.31->2.18->2.10->1.97. CVP 10   Started on po Amio for frequent PVC's and NSVT on 7/28. Continues to have frequent PVC's.   Echo with EF 10-15%, RV moderately HK.   Feels good today, wants to get out of bed and walk. Family present (sister and caregiver at bedside). Denies SOB and palpitations.   Objective:   Weight Range: 162 lb 8 oz (73.7 kg) Body mass index is 20.86 kg/m.   Vital Signs:   Temp:  [97.6 F (36.4 C)-98.1 F (36.7 C)] 98 F (36.7 C) (07/30 0456) Pulse Rate:  [78-98] 92 (07/30 0456) Resp:  [16-27] 22 (07/30 0035) BP: (92-110)/(64-81) 92/64 (07/30 0456) SpO2:  [93 %-99 %] 99 % (07/30 0456) Weight:  [162 lb 8 oz (73.7 kg)] 162 lb 8 oz (73.7 kg) (07/30 0503) Last BM Date: 12/23/16  Weight change: Filed Weights   12/23/16 0647 12/24/16 0400 12/25/16 0503  Weight: 163 lb (73.9 kg) 161 lb 8 oz (73.3 kg) 162 lb 8 oz (73.7 kg)    Intake/Output:   Intake/Output Summary (Last 24 hours) at 12/25/16 0735 Last data filed at 12/24/16 1959  Gross per 24 hour  Intake          1169.16 ml  Output             1775 ml  Net          -605.84 ml      Physical Exam   CVP 10  General: Elderly male, NAD. Lying in bed.  HEENT: Normal  Neck: supple. JVP to jaw.  Carotids 2+ bilat; no bruits. No lymphadenopathy or thryomegaly appreciated. Cor: PMI  laterally displaced. Irregularly irregular. +s3.  Lungs: Clear bilaterally, normal effort.  Abdomen: Soft, non tender, non distended. No hepatosplenomegaly. No bruits or masses. + bowel sounds.  Extremities: no cyanosis, clubbing, rash. No  peripheral edema.  Neuro: Alert and oriented x 3.  cranial nerves grossly intact. moves all 4 extremities w/o difficulty. Affect flat.    Telemetry   Afib, rates in the 90's. Polymorphic PVC's, frequent ectopy. - personally reviewed.   EKG   N/A   Labs    CBC  Recent Labs  12/24/16 0500 12/25/16 0415  WBC 7.7 7.1  NEUTROABS 4.8 3.8  HGB 8.4* 8.2*  HCT 27.4* 26.4*  MCV 88.1 88.6  PLT 202 428   Basic Metabolic Panel  Recent Labs  12/23/16 0410 12/24/16 0500 12/25/16 0415  NA 131* 131* 134*  K 3.7 3.2* 3.8  CL 94* 91* 90*  CO2 29 31 34*  GLUCOSE 84 129* 131*  BUN 57* 59* 61*  CREATININE 2.18* 2.10* 1.97*  CALCIUM 8.5* 8.7* 9.0  MG 2.5*  --   --      BNP: BNP (last 3 results)  Recent Labs  12/04/16 1128 12/14/16 1436 12/21/16 1154  BNP 1,974.7* 1,024.0* 3,403.3*      Imaging   Transthoracic Echocardiography 12/22/16 Study Conclusions  - Left ventricle: The cavity size was moderately dilated. Wall   thickness was normal. Systolic function was severely reduced. The   estimated ejection fraction was 15%. Diffuse hypokinesis.  The   study is not technically sufficient to allow evaluation of LV   diastolic function. - Aortic valve: Sclerosis without stenosis. There was no   regurgitation. - Aorta: Dilated aortic root. Aortic root dimension: 42 mm (ED). - Mitral valve: Mildly thickened leaflets . There was moderate   regurgitation. - Left atrium: Severely dilated. - Right ventricle: The cavity size was mildly dilated. Systolic   function is moderately reduced. Pacer wire or AICD noted in right   ventricle. - Right atrium: Severely dilated. Pacer wire or AICD noted in right   atrium. - Tricuspid valve: There was moderate regurgitation. - Pulmonary arteries: PA peak pressure: 58 mm Hg (S). - Inferior vena cava: The vessel was dilated. The respirophasic   diameter changes were blunted (< 50%), consistent with elevated   central venous  pressure.  Impressions:  - Compared to a prior study in 07/2016, there has been no   significant change.   Medications:     Scheduled Medications: . allopurinol  100 mg Oral Daily  . amiodarone  200 mg Oral BID  . atorvastatin  40 mg Oral q morning - 10a  . feeding supplement (ENSURE ENLIVE)  237 mL Oral QID  . potassium chloride  40 mEq Oral Daily  . sodium chloride flush  10-40 mL Intracatheter Q12H  . Warfarin - Pharmacist Dosing Inpatient   Does not apply q1800    Infusions: . furosemide Stopped (12/24/16 1814)  . milrinone 0.25 mcg/kg/min (12/24/16 2351)    PRN Medications: acetaminophen, ALPRAZolam, nitroGLYCERIN, ondansetron (ZOFRAN) IV, sodium chloride flush    Patient Profile   Jason Choi is a 76 y.o. male with h/o CAD s/p CABG, systolic due to ischemic cardiomyopathy Echo 07/2016 LVEF 15% s/p ICD implant 09/2016 - Medtronic, and permanent Afib  Admitted 12/21/16 with worsening SOB and concerns for low output  Assessment/Plan   1. 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 Echo (07/28/16) LVEF 15%, severe LV dilation, severe MR, mild RV dysfunction, Mod RAE, PA peak pressure 49 mm Hg. This is worsened from 6/14 echo with EF 35-40%. S/P Medtronic Single Chamber ICD. Activity less than 1 hour per day. Repeat echo 12/23/15  EF 10-15% moderate RV Hk. - NYHA IIIb - Continues on milrinone 0.25 mcg/kg/min, will likely need at home. Co ox 59%. Remains marginal.  - Still with JVP to jaw, CVP 10. Continue IV diuresis today.  - He wishes to discuss home milrinone with Dr. [name]. Will change PICC to single lumen if he goes home on milrinone.  - No ACE/ARB/Dig/Spiro with CKD.  - Holding off on beta blocker with low output  - With deconditioning, CKD, and foot ulcer, not a candidate for LVAD.   2. Syncope. - had episode in 2/18 (prior to ICD) - No driving until 08/345.  - No recurrent syncope.   3. CAD: S/p CABG in 2002.   - No s/s ischemia.  - No ASA with stable CAD and need for Coumadin.    4. CKD: Stage III-IV - Baseline 2.0 - 2.2 - Creatinine improving.   5. Permanent Afib; failed amiodarone and DCCV.  - rates in the 90-100's. No beta blocker with decompensation.  - Continue coumadin for anticoagulation .   6. Hypokalemia - K 3.8 today, continue supplement.   7. Frequent PVCs with occasional NSVT - averaging >20 PVCs/min (~20%). - Continue Amio 200 mg BID.  - Keep K >4.0 Mg > 2.0. Not candidate for b-blocker with low output  8. Right foot ulceration - Stable, wound care has seen  9. 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  - Continue allopurinol.   10. Deconditioning:  - Will have cardiac rehab see.  - No change.   11. ETOH abuse  - Encouraged complete cessation.  - He says he has not had a drink in 3 weeks.  - No change.    12. Subtherapeutic INR, on admission - Calls medication compliance into question. Especially given tendency to leave hospital prior to completion of evaluation/treatment - INR 2.19 today.   13. Anxiety - Improved with Xanax 0.25 mg BID prn.  - No change.   Length of Stay: [place], NP  12/25/2016, 7:35 AM  Advanced Heart Failure Team Pager [phone] (M-F; 7a - 4p)  Please contact [facility] for night-coverage after hours (4p -7a ) and weekends on [email or web address]  Patient seen with NP, agree with the above note.    He remains volume overloaded today but diuresing some.  CVP measured at 10 this morning but JVP very elevated still. Creatinine stable. Co-ox 59% on milrinone 0.25.  - Continue Lasix 120 mg IV bid and will give dose of metolazone 2.5 x 1 today.  - Low output HF.  Not candidate for LVAD (foot wound, deconditioned, renal dysfunction).  I talked about options with him including home milrinone versus hospice/palliative care.  He is interesting in trying home milrinone. Will arrange this, transition to single lumen PICC on the day  of discharge.   INR therapeutic, continue warfarin. Chronic atrial fibrillation.   Frequent PVCs, ?contribution to cardiomyopathy.  Continue amiodarone 200 mg bid.   Loralie Champagne 12/25/2016 1:30 PM

## 2016-12-25 NOTE — Progress Notes (Signed)
[facility] for Coumadin Indication: atrial fibrillation  Allergies  Allergen Reactions  . Lidocaine Other (See Comments)    Passed out - at the dentist's office  . Procaine Hcl Other (See Comments)    Passed out - at the dentist's office    Patient Measurements: Height: 6\' 2"  (188 cm) Weight: 162 lb 8 oz (73.7 kg) IBW/kg (Calculated) : 82.2 Heparin Dosing Weight: n/a  Vital Signs: Temp: 97.4 F (36.3 C) (07/30 1137) Temp Source: Axillary (07/30 1137) BP: 93/58 (07/30 1137) Pulse Rate: 90 (07/30 1137)  Labs:  Recent Labs  12/23/16 0410 12/24/16 0500 12/25/16 0415  HGB 8.5* 8.4* 8.2*  HCT 28.3* 27.4* 26.4*  PLT 207 202 201  LABPROT 23.0* 24.3* 24.8*  INR 2.01 2.15 2.19  CREATININE 2.18* 2.10* 1.97*    Estimated Creatinine Clearance: 34.3 mL/min (A) (by C-G formula based on SCr of 1.97 mg/dL (H)).  Assessment: 76 yo male on chronic Coumadin for afib. INR 1.6 low on admit but now trend up 2.1 at goal  On warfarin 5mg  daily - continue Hgb down slightly from 9.7>8.5, no bleeding issues noted. Restarted po amiodarone 200mg  BID 7/28  Patient out patient warfarin dosing has combination of 3/6mg  - recently changed post d/c from Rehab.  Patient only has warfarin 5mg  tablets at home and has been taking 5mg  daily.   He has a large full bottle of warfarin 5mg  and wants to use those tablets at DC.    Goal of Therapy:  INR 2-3 Monitor platelets by anticoagulation protocol: Yes   Plan:  1. Coumadin 5mg  daily 2. Daily INR.  Bonnita Nasuti Pharm.D. CPP, BCPS Clinical Pharmacist [phone] 12/25/2016 2:47 PM

## 2016-12-26 ENCOUNTER — Inpatient Hospital Stay (HOSPITAL_COMMUNITY): Payer: Medicare Other

## 2016-12-26 DIAGNOSIS — Z515 Encounter for palliative care: Secondary | ICD-10-CM

## 2016-12-26 DIAGNOSIS — Z7189 Other specified counseling: Secondary | ICD-10-CM

## 2016-12-26 LAB — BASIC METABOLIC PANEL
Anion gap: 9 (ref 5–15)
BUN: 66 mg/dL — AB (ref 6–20)
CO2: 35 mmol/L — ABNORMAL HIGH (ref 22–32)
CREATININE: 2.08 mg/dL — AB (ref 0.61–1.24)
Calcium: 9 mg/dL (ref 8.9–10.3)
Chloride: 89 mmol/L — ABNORMAL LOW (ref 101–111)
GFR calc Af Amer: 34 mL/min — ABNORMAL LOW (ref >60)
GFR, EST NON AFRICAN AMERICAN: 30 mL/min — AB (ref >60)
GLUCOSE: 95 mg/dL (ref 65–99)
POTASSIUM: 3.6 mmol/L (ref 3.5–5.1)
SODIUM: 133 mmol/L — AB (ref 135–145)

## 2016-12-26 LAB — COOXEMETRY PANEL
Carboxyhemoglobin: 1.7 % — ABNORMAL HIGH (ref 0.5–1.5)
Methemoglobin: 0.9 % (ref 0.0–1.5)
O2 Saturation: 66 %
Total hemoglobin: 8 g/dL — ABNORMAL LOW (ref 12.0–16.0)

## 2016-12-26 LAB — CBC WITH DIFFERENTIAL/PLATELET
BASOS PCT: 1 %
Basophils Absolute: 0.1 10*3/uL (ref 0.0–0.1)
EOS ABS: 1 10*3/uL — AB (ref 0.0–0.7)
Eosinophils Relative: 14 %
HEMATOCRIT: 27.4 % — AB (ref 39.0–52.0)
HEMOGLOBIN: 8.5 g/dL — AB (ref 13.0–17.0)
LYMPHS ABS: 1.2 10*3/uL (ref 0.7–4.0)
Lymphocytes Relative: 18 %
MCH: 28.1 pg (ref 26.0–34.0)
MCHC: 31 g/dL (ref 30.0–36.0)
MCV: 90.4 fL (ref 78.0–100.0)
MONO ABS: 0.7 10*3/uL (ref 0.1–1.0)
MONOS PCT: 9 %
NEUTROS ABS: 4.1 10*3/uL (ref 1.7–7.7)
NEUTROS PCT: 58 %
Platelets: 217 10*3/uL (ref 150–400)
RBC: 3.03 MIL/uL — ABNORMAL LOW (ref 4.22–5.81)
RDW: 18.8 % — AB (ref 11.5–15.5)
WBC: 7 10*3/uL (ref 4.0–10.5)

## 2016-12-26 LAB — PROTIME-INR
INR: 2.42
PROTHROMBIN TIME: 26.8 s — AB (ref 11.4–15.2)

## 2016-12-26 MED ORDER — WARFARIN SODIUM 2.5 MG PO TABS
2.5000 mg | ORAL_TABLET | Freq: Once | ORAL | Status: AC
  Administered 2016-12-26: 2.5 mg via ORAL
  Filled 2016-12-26: qty 1

## 2016-12-26 MED ORDER — AMIODARONE HCL 200 MG PO TABS
400.0000 mg | ORAL_TABLET | Freq: Two times a day (BID) | ORAL | Status: DC
  Administered 2016-12-26 – 2016-12-27 (×3): 400 mg via ORAL
  Filled 2016-12-26 (×3): qty 2

## 2016-12-26 MED ORDER — TORSEMIDE 20 MG PO TABS
40.0000 mg | ORAL_TABLET | Freq: Two times a day (BID) | ORAL | Status: DC
  Administered 2016-12-26 – 2016-12-27 (×2): 40 mg via ORAL
  Filled 2016-12-26 (×2): qty 2

## 2016-12-26 MED ORDER — POTASSIUM CHLORIDE CRYS ER 20 MEQ PO TBCR
20.0000 meq | EXTENDED_RELEASE_TABLET | Freq: Once | ORAL | Status: AC
  Administered 2016-12-26: 20 meq via ORAL
  Filled 2016-12-26: qty 1

## 2016-12-26 MED ORDER — TORSEMIDE 20 MG PO TABS: 60.0000 mg | ORAL_TABLET | Freq: Every day | ORAL | Status: DC

## 2016-12-26 MED ORDER — TORSEMIDE 20 MG PO TABS
40.0000 mg | ORAL_TABLET | Freq: Every day | ORAL | Status: DC
  Administered 2016-12-26: 40 mg via ORAL
  Filled 2016-12-26: qty 2

## 2016-12-26 NOTE — Consult Note (Signed)
Consultation Note Date: 12/26/2016   Patient Name: Jason Choi  DOB: 08-09-1940  MRN: [id number]  Age / Sex: 76 y.o., male  PCP: Heywood Bene, PA-C Referring Physician: Larey Dresser, MD  Reason for Consultation: Establishing goals of care and Psychosocial/spiritual support  HPI/Patient Profile: 76 y.o. male  admitted on 12/21/2016 with   h/o CAD s/p CABG, systolic due to ischemic cardiomyopathy Echo 07/2016 LVEF 15% s/p ICD implant 09/2016 - Medtronic, and permanent Afib  Pt previously admitted 12/04/2016 with fatigue and weakness. Due to hypotension, dig, bb and spiro stopped. Pacing rate increased. Discharged early from [facility] as he demanded to go home. Discharge weight was 163 lbs.   Seen in HF clinic 12/13/16. Weight stable from discharge at that visit at 163 lbs. Concerns rased by [facility] as weight had decreased from 185 -> 159 over 1 month period. Torsemide cut back with weight loss and poor nutrition. Concerned for recurrent low output HF. RHC planned pending labs.   Pt presented to [facility] today with worsening SOB over one week. Required CPAP initiation by EMS who were called to his house.  Pertinent labs on admission include Cr 2.45, K 4.8, BNP 3403, Hgb 9.7. Initial Troponin 0.16. CXR with worsening bilateral pulmonary edema.      Pt has been feeling worse since seeing [place] last week. States weight only up 2-3 lbs at home. SOB with minimal exertion and + orthopnea. No energy or appetite. Frustrated with lack of "progress". Feels like medicines aren't working. States he left hospital early last admission because nothing was helping him and he was getting confused. No fevers or chills. Cough but no production. He states he has been taking all of his medication as directed.   Patient faces advanced directive decisions, treatment options decisions, and anticipatory care needs.   Clinical  Assessment and Goals of Care:    This NP Wadie Lessen reviewed medical records, received report from team, assessed the patient and then meet at the patient's bedside to discuss diagnosis, prognosis, GOC, EOL wishes disposition and options.   A detailed discussion was had today regarding advanced directives.  Concepts specific to code status, artifical feeding and hydration, continued IV antibiotics and rehospitalization was had.  The difference between a aggressive medical intervention path  and a palliative comfort care path for this patient at this time was had.  Values and goals of care important to patient and family were attempted to be elicited.  Concept of  Palliative Care was introduced  Natural trajectory and expectations at EOL were discussed.  Questions and concerns addressed.   Family encouraged to call with questions or concerns.  PMT will continue to support holistically.    NEXT OF KIN    SUMMARY OF RECOMMENDATIONS    Code Status/Advance Care Planning:  Full code  we did discuss the importance of documentation of advance directives for Jason Choi specific to his AICD and desire for resuscitation.    Additional Recommendations (Limitations, Scope, Preferences):  Full Scope Treatment-Jason Choi  verbalizes little insight in the seriousness of his situation and overall long-term poor prognosis. He ultimately speaks of his goal to "get better" and back to his activities and social encounters with his friends.    Psycho-social/Spiritual:   Wife died in 08/12/2016.  Jason Choi has very little support at home.  He has some friends and neighbors that help him out with groceries and doctor's appointments.  I worry with his increased anticipatory care needs, continued physical and functional decline, he is high risk for decompensation.     I did consult his friend and minister Jason Choi at patient's request.    Additional Recommendations: We discussed  palliative services I will make a referral to hospice of [place].  Prognosis:   < 6 months  Discharge Planning: Home with Home Health      Primary Diagnoses: Present on Admission: . Acute on chronic systolic (congestive) heart failure ([place])   I have reviewed the medical record, interviewed the patient and family, and examined the patient. The following aspects are pertinent.  Past Medical History:  Diagnosis Date  . AICD (automatic cardioverter/defibrillator) present   . Anemia    Referral to GI 08/13/11  . Arthritis    "hands primarily" (10/12/2016)  . Atrial fibrillation ([place])    ON AMIODARONE: PER VISIT NOTE IN [facility]  . CHF (congestive heart failure) ([place])   . Chronic anticoagulation    on coumadin  . Chronic kidney disease (CKD), stage II (mild)   . Gout   . High risk medication use    on amiodarone  . HTN (hypertension)   . Hypercholesteremia   . LV dysfunction    EF 35 to 40% per echo May 2012; EF remains 35 to 40% per echo 08-13-2011. Referred for ICD  . Myocardial infarction ([place])   . Osteoarthritis of right shoulder region    ACUTE PAIN  . 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 Pulmonary hypertension, moderate to severe [facility])    Social History   Social History  . Marital status: Widowed    Spouse name: N/A  . Number of children: 1  . Years of education: N/A   Occupational History  . 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  [employer]   Social History Main Topics  . Smoking status: Former Smoker    Packs/day: 2.00    Years: 20.00    Types: Cigarettes  . Smokeless tobacco: Never Used     Comment: 10/12/2016 "haven't had a cigarette since 1980"  . Alcohol use 7.2 oz/week    4 Shots of liquor, 8 Cans of beer per week     Comment: 10/12/2016 "couple beers and a shot of [place] royal most  3-4 times/week"  . Drug use: No  . Sexual activity: No   Other Topics Concern  . None   Social History Narrative    Pt lives in [place] with spouse (who is a Systems analyst).  1 grown healthy daughter who is a Insurance claims handler.       Retired 2003 from [employer].  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   Family History  Problem Relation Age of Onset  . Valvular heart disease Father   . Colon cancer Neg Hx   . Colon polyps Neg Hx   . Rectal cancer Neg Hx   . Stomach cancer Neg Hx  Scheduled Meds: . allopurinol  100 mg Oral Daily  . amiodarone  400 mg Oral BID  . atorvastatin  40 mg Oral q morning - 10a  . feeding supplement (ENSURE ENLIVE)  237 mL Oral QID  . potassium chloride  40 mEq Oral Daily  . sodium chloride flush  10-40 mL Intracatheter Q12H  . torsemide  40 mg Oral BID  . warfarin  2.5 mg Oral ONCE-1800  . Warfarin - Pharmacist Dosing Inpatient   Does not apply q1800   Continuous Infusions: . milrinone 0.25 mcg/kg/min (12/26/16 1217)   PRN Meds:.acetaminophen, ALPRAZolam, nitroGLYCERIN, ondansetron (ZOFRAN) IV, sodium chloride flush Medications Prior to Admission:  Prior to Admission medications   Medication Sig Start Date End Date Taking? Authorizing Provider  acetaminophen (TYLENOL) 325 MG tablet Take 2 tablets (650 mg total) by mouth every 4 (four) hours as needed for headache or mild pain. 08/10/16  Yes Shirley Friar, PA-C  allopurinol (ZYLOPRIM) 100 MG tablet Take 1 tablet (100 mg total) by mouth daily. 09/21/16  Yes Larey Dresser, MD  atorvastatin (LIPITOR) 40 MG tablet Take 1 tablet (40 mg total) by mouth every evening. Patient taking differently: Take 40 mg by mouth every morning.  09/21/16  Yes Larey Dresser, MD  Multiple Vitamin (MULTI-VITAMIN DAILY) TABS Take 1 tablet by mouth daily.   Yes [provider]  nitroGLYCERIN (NITROSTAT) 0.4 MG SL tablet Place 1 tablet (0.4 mg total) under the tongue every 5 (five) minutes x 3 doses as needed for chest pain. 12/06/16  Yes Arbutus Leas, NP  potassium chloride SA (KLOR-CON M20) 20 MEQ  tablet Take 1 tablet (20 mEq total) by mouth daily. 09/21/16  Yes Larey Dresser, MD  torsemide (DEMADEX) 20 MG tablet Take 2 tablets (40 mg total) by mouth daily. 12/14/16  Yes Larey Dresser, MD  warfarin (COUMADIN) 5 MG tablet Take 5 mg by mouth See admin instructions.    Yes [provider]   Allergies  Allergen Reactions  . Lidocaine Other (See Comments)    Passed out - at the dentist's office  . Procaine Hcl Other (See Comments)    Passed out - at the dentist's office   Review of Systems  Constitutional: Positive for fatigue.  Neurological: Positive for weakness.    Physical Exam  Constitutional: He appears ill. Nasal cannula in place.  Appears weak and tired  Cardiovascular: Normal rate, regular rhythm and normal heart sounds.   Skin: Skin is warm and dry.  - ashen and gray    Vital Signs: BP (!) 105/59 (BP Location: Left Arm)   Pulse 89   Temp 97.6 F (36.4 C) (Oral)   Resp 18   Ht 6\' 2"  (1.88 m)   Wt 73.8 kg (162 lb 11.2 oz)   SpO2 98%   BMI 20.89 kg/m  Pain Assessment: No/denies pain   Pain Score: 0-No pain   SpO2: SpO2: 98 % O2 Device:SpO2: 98 % O2 Flow Rate: .O2 Flow Rate (L/min): 2 L/min  IO: Intake/output summary:  Intake/Output Summary (Last 24 hours) at 12/26/16 1557 Last data filed at 12/26/16 1500  Gross per 24 hour  Intake             1554 ml  Output             1900 ml  Net             -346 ml    LBM: Last BM Date:  (per pt*  last BM Friday or Saturday and was very small ) Baseline Weight: Weight: 75.5 kg (166 lb 8 oz) Most recent weight: Weight: 73.8 kg (162 lb 11.2 oz)     Palliative Assessment/Data: 40 % at best     Time In: 1500 Time Out: 1600 Time Total: 60 min Greater than 50%  of this time was spent counseling and coordinating care related to the above assessment and plan.  Signed by: Wadie Lessen, NP   Please contact Palliative Medicine Team phone at [phone] for questions and concerns.  For individual  provider: See Shea Evans

## 2016-12-26 NOTE — Progress Notes (Signed)
Advanced Heart Failure Rounding Note  PCP: Clyde Lundborg Primary Cardiologist: Dr. Martinique HF Cardiology: Dr. [name]  Subjective:    Admitted 12/21/16 with volume overload, concern for low output HF.  Started on milrinone on 7/28 for low output with co-ox 45%. Co ox 66% today. Remains on milrinone 0.25 mcg and IV lasix 120 mg BID, had dose of metolazone yesterday. Weight down 4 pounds total. Creatinine 2.45->2.31->2.18->2.10->1.97->2.08. CVP 10   Started on po Amio for frequent PVC's and NSVT on 7/28. Continues to have frequent PVC's and some NSVT.    Echo with EF 10-15%, RV moderately HK.   Worked with PT yesterday, needs home health. Feels ok, wants to go home. Denies SOB and chest pain. Weight unchanged from yesterday, down 4 pounds total.   Objective:   Weight Range: 162 lb 11.2 oz (73.8 kg) Body mass index is 20.89 kg/m.   Vital Signs:   Temp:  [97.4 F (36.3 C)-98.8 F (37.1 C)] 98.1 F (36.7 C) (07/31 0412) Pulse Rate:  [88-98] 89 (07/31 0412) Resp:  [18-23] 20 (07/31 0412) BP: (93-105)/(58-69) 105/59 (07/31 0412) SpO2:  [94 %-100 %] 98 % (07/31 0412) Weight:  [162 lb 11.2 oz (73.8 kg)] 162 lb 11.2 oz (73.8 kg) (07/31 0429) Last BM Date:  (per pt* last BM Friday or Saturday and was very small )  Weight change: Filed Weights   12/24/16 0400 12/25/16 0503 12/26/16 0429  Weight: 161 lb 8 oz (73.3 kg) 162 lb 8 oz (73.7 kg) 162 lb 11.2 oz (73.8 kg)    Intake/Output:   Intake/Output Summary (Last 24 hours) at 12/26/16 0733 Last data filed at 12/26/16 0700  Gross per 24 hour  Intake             1156 ml  Output             2000 ml  Net             -844 ml      Physical Exam  CVP 10 General: Elderly male, NAD. Lying in bed.  HEENT: Normal.   Neck: supple. JVP to jaw. Carotids 2+ bilat; no bruits. No lymphadenopathy or thryomegaly appreciated. Cor: PMI laterally displaced, irregularly irregular. + S3.  Lungs: Clear bilaterally. Normal effort.    Abdomen: Soft, non distended. No hepatosplenomegaly. No bruits or masses. + bowel sounds.  Extremities: no cyanosis, clubbing, rash. No peripheral edema.  Neuro: Alert and oriented x 3.  cranial nerves grossly intact. moves all 4 extremities w/o difficulty. Affect flat.    Telemetry   Afib, frequent ectopy. NSVT noted - personally reviewed.   EKG   N/A   Labs    CBC  Recent Labs  12/25/16 0415 12/26/16 0248  WBC 7.1 7.0  NEUTROABS 3.8 4.1  HGB 8.2* 8.5*  HCT 26.4* 27.4*  MCV 88.6 90.4  PLT 201 539   Basic Metabolic Panel  Recent Labs  12/25/16 0415 12/26/16 0248  NA 134* 133*  K 3.8 3.6  CL 90* 89*  CO2 34* 35*  GLUCOSE 131* 95  BUN 61* 66*  CREATININE 1.97* 2.08*  CALCIUM 9.0 9.0     BNP: BNP (last 3 results)  Recent Labs  12/04/16 1128 12/14/16 1436 12/21/16 1154  BNP 1,974.7* 1,024.0* 3,403.3*      Imaging   Transthoracic Echocardiography 12/22/16 Study Conclusions  - Left ventricle: The cavity size was moderately dilated. Wall   thickness was normal. Systolic function was severely reduced. The  estimated ejection fraction was 15%. Diffuse hypokinesis. The   study is not technically sufficient to allow evaluation of LV   diastolic function. - Aortic valve: Sclerosis without stenosis. There was no   regurgitation. - Aorta: Dilated aortic root. Aortic root dimension: 42 mm (ED). - Mitral valve: Mildly thickened leaflets . There was moderate   regurgitation. - Left atrium: Severely dilated. - Right ventricle: The cavity size was mildly dilated. Systolic   function is moderately reduced. Pacer wire or AICD noted in right   ventricle. - Right atrium: Severely dilated. Pacer wire or AICD noted in right   atrium. - Tricuspid valve: There was moderate regurgitation. - Pulmonary arteries: PA peak pressure: 58 mm Hg (S). - Inferior vena cava: The vessel was dilated. The respirophasic   diameter changes were blunted (< 50%), consistent  with elevated   central venous pressure.  Impressions:  - Compared to a prior study in 07/2016, there has been no   significant change.   Medications:     Scheduled Medications: . allopurinol  100 mg Oral Daily  . amiodarone  200 mg Oral BID  . atorvastatin  40 mg Oral q morning - 10a  . feeding supplement (ENSURE ENLIVE)  237 mL Oral QID  . potassium chloride  40 mEq Oral Daily  . sodium chloride flush  10-40 mL Intracatheter Q12H  . warfarin  5 mg Oral q1800  . Warfarin - Pharmacist Dosing Inpatient   Does not apply q1800    Infusions: . furosemide Stopped (12/25/16 1800)  . milrinone 0.25 mcg/kg/min (12/25/16 1658)    PRN Medications: acetaminophen, ALPRAZolam, nitroGLYCERIN, ondansetron (ZOFRAN) IV, sodium chloride flush    Patient Profile   [name] is a 76 y.o. male with h/o CAD s/p CABG, systolic due to ischemic cardiomyopathy Echo 07/2016 LVEF 15% s/p ICD implant 09/2016 - Medtronic, and permanent Afib  Admitted 12/21/16 with worsening SOB and concerns for low output  Assessment/Plan   1. 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 Echo (07/28/16) LVEF 15%, severe LV dilation, severe MR, mild RV dysfunction, Mod RAE, PA peak pressure 49 mm Hg. This is worsened from 6/14 echo with EF 35-40%. S/P Medtronic Single Chamber ICD. Activity less than 1 hour per day. Repeat echo 12/23/15  EF 10-15% moderate RV Hk. - NYHA IIIb - Will continue milrinone at home. Called [facility]. Will switch to single lumen PICC - No ACE/ARB/Dig/Spiro with CKD.  - CVP 10. Creatinine bumped today, will stop IV diuresis and start torsemide 40 mg bid (home dose was 40 mg daily).   2. Syncope. - had episode in 2/18 (prior to ICD) - No driving until 01/5283.  - No recurrent syncope.   3. CAD: S/p CABG in 2002.  - No signs or symptoms of ischemia.  - No ASA with stable CAD and need for Coumadin.    4. CKD: Stage III-IV - Baseline 2.0 - 2.2 -  Creatinine improving.  - Creatinine bumped today. Will stop IV diuresis.   5. Permanent Afib; failed amiodarone and DCCV.  - rates in the 90-100's. No beta blocker with decompensation.  - Continue coumadin for anticoagulation.   6. Hypokalemia - K 3.6 today. Will give extra 74mEq of KcL today. Then will start 40 mEq daily with torsemide.    7. Frequent PVCs with occasional NSVT - averaging >20 PVCs/min (~20%). - Keep K >4.0 Mg > 2.0. Not candidate for b-blocker with low output   - Continues to have  frequent ectopy. Will increase Amio to 400 mg BID for 7 days, then reduce back down to 200 mg BID.   8. Right foot ulceration - Stable, wound care has seen - He has follow up with wound care outpatient.   9. 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  - Continue allopurinol.  - No change.   10. Deconditioning:  - PT saw, recommends home health PT  11. ETOH abuse  - Encouraged complete cessation.  - He says he has not had a drink in 3 weeks.  - No change.    12. Subtherapeutic INR, on admission - INR now 2.42. Warfarin per pharmacy.   13. Anxiety - Improved with Xanax 0.25 mg BID prn.  - no change.   Length of Stay: [place], NP  12/26/2016, 7:33 AM  Advanced Heart Failure Team Pager [phone] (M-F; 7a - 4p)  Please contact [facility] for night-coverage after hours (4p -7a ) and weekends on [email or web address]  Patient seen with NP, agree with the above note.  He diuresed yesterday, now with CVP about 10 and mild rise in creatinine.  Co-ox 66%.   - Low output HF.  Not candidate for LVAD (foot wound, deconditioned, renal dysfunction).  He wants to try home milrinone. Will arrange this, transition to single lumen PICC.  - We had a discussion today about prognosis.  Given inotropic dependence, his mortality risk over the next few months is high.  He is not interested in hospice at this time but interested in palliative care options.  I will consult palliative care  service.  - Transitioning to po diuretic, would increase torsemide for home to 40 mg bid.   INR therapeutic, continue warfarin. Chronic atrial fibrillation.   Frequent PVCs and occasional NSVT, ?contribution to cardiomyopathy.  We have started amiodarone for ventricular arrhythmias while on milrinone => 400 mg bid amiodarone x 1 week then 200 mg bid x 1 week then 200 mg daily.   He will need home oxygen.    Possibly home tomorrow with home milrinone and close followup.   Loralie Champagne 12/26/2016 1:33 PM

## 2016-12-26 NOTE — Evaluation (Signed)
Occupational Therapy Evaluation Patient Details Name: Jason Choi MRN: [id number] DOB: [birth year] Today's Date: 12/26/2016    History of Present Illness Patient is a 76 y/o male who presents with SOB. 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 CXR-pulmonary edema. PMH includes CHF, polio, ischemic cardiomyopathy, right foot wound, CAD s/p CABG, MI, AICD, CKD, A-fib   Clinical Impression   Pt was modified independent in self care and ambulated with a cane prior to admission. He relied on an aide 5 hours per day to assist with IADL and transportation. Pt was working until March of this year. Pt presents with decreased activity tolerance, generalized weakness and impaired standing balance interfering with ability to perform at his baseline. Will follow acutely. Anticipate pt will be able to return home with intermittent assist of his caregiver.    Follow Up Recommendations  Home health OT    Equipment Recommendations  None recommended by OT    Recommendations for Other Services       Precautions / Restrictions Precautions Precautions: Fall Precaution Comments: watch 02; Rt foot wound Restrictions Weight Bearing Restrictions: No      Mobility Bed Mobility      General bed mobility comments: Pt in chair.  Transfers Overall transfer level: Needs assistance Equipment used: Rolling walker (2 wheeled) Transfers: Sit to/from Stand Sit to Stand: Min guard         General transfer comment: min guard for safety, increased time    Balance Overall balance assessment: Needs assistance Sitting-balance support: Feet supported;No upper extremity supported Sitting balance-Leahy Scale: Good     Standing balance support: During functional activity Standing balance-Leahy Scale: Poor Standing balance comment: Requires UE support in standing.                            ADL either performed or assessed with clinical judgement   ADL Overall ADL's :  Needs assistance/impaired Eating/Feeding: Independent;Sitting   Grooming: Min guard;Standing   Upper Body Bathing: Set up;Sitting   Lower Body Bathing: Minimal assistance;Sit to/from stand   Upper Body Dressing : Set up;Sitting   Lower Body Dressing: Minimal assistance;Sit to/from stand   Toilet Transfer: Min guard;Ambulation;RW;BSC   Toileting- Clothing Manipulation and Hygiene: Minimal assistance;Sit to/from stand       Functional mobility during ADLs: Min guard;Rolling walker       Vision Patient Visual Report: No change from baseline       Perception     Praxis      Pertinent Vitals/Pain Pain Assessment: Faces Faces Pain Scale: Hurts a little bit Pain Location: right foot wound Pain Descriptors / Indicators: Aching;Sore Pain Intervention(s): Monitored during session;Repositioned     Hand Dominance Right   Extremity/Trunk Assessment Upper Extremity Assessment Upper Extremity Assessment: Generalized weakness (arthritic changes in hands)   Lower Extremity Assessment Lower Extremity Assessment: Defer to PT evaluation RLE Deficits / Details: Rt foot wound not visualized but being followed by WOC.        Communication Communication Communication: No difficulties   Cognition Arousal/Alertness: Awake/alert Behavior During Therapy: WFL for tasks assessed/performed Overall Cognitive Status: Within Functional Limits for tasks assessed                                     General Comments       Exercises     Shoulder Instructions  Home Living Family/patient expects to be discharged to:: Private residence Living Arrangements: Alone Available Help at Discharge: Available PRN/intermittently;Personal care attendant (5 hours a day) Type of Home: House Home Access: Stairs to enter [employer] of Steps: 3 Entrance Stairs-Rails: Right;Left Home Layout: One level     Bathroom Shower/Tub: Occupational psychologist:  Handicapped height Bathroom Accessibility: Yes How Accessible: Accessible via [place]: Grab bars - tub/shower;Walker - 2 wheels;Cane - single point;Wheelchair - manual;Shower seat          Prior Functioning/Environment Level of Independence: Independent with assistive device(s);Needs assistance  Gait / Transfers Assistance Needed: walks with a cane ADL's / Homemaking Assistance Needed: performs personal care modified independently, sponge bathes due to foot wound, assisted for IADL by aide   Comments: pt was working until March, aide drives him        OT Problem List: Decreased strength;Decreased activity tolerance;Impaired balance (sitting and/or standing);Cardiopulmonary status limiting activity;Pain      OT Treatment/Interventions: Self-care/ADL training;DME and/or AE instruction;Patient/family education;Balance training;Therapeutic activities;Energy conservation    OT Goals(Current goals can be found in the care plan section) Acute Rehab OT Goals Patient Stated Goal: to return home ASAP OT Goal Formulation: With patient Time For Goal Achievement: 01/02/17 Potential to Achieve Goals: Good ADL Goals Pt Will Perform Grooming: with modified independence;standing Pt Will Perform Upper Body Bathing: with modified independence;sitting Pt Will Perform Lower Body Bathing: with modified independence;sit to/from stand Pt Will Perform Upper Body Dressing: with modified independence;sitting Pt Will Perform Lower Body Dressing: with modified independence;sit to/from stand Pt Will Transfer to Toilet: with modified independence;ambulating Pt Will Perform Toileting - Clothing Manipulation and hygiene: with modified independence;sit to/from stand Additional ADL Goal #1: Pt will employ energy conservation strategies in ADL with minimal verbal cues.  OT Frequency: Min 2X/week   Barriers to D/C:            Co-evaluation              AM-PAC PT "6 Clicks" Daily Activity      Outcome Measure Help from another person eating meals?: None Help from another person taking care of personal grooming?: A Little Help from another person toileting, which includes using toliet, bedpan, or urinal?: A Little Help from another person bathing (including washing, rinsing, drying)?: A Little Help from another person to put on and taking off regular upper body clothing?: None Help from another person to put on and taking off regular lower body clothing?: A Little 6 Click Score: 20   End of Session Equipment Utilized During Treatment: Gait belt;Rolling walker;Oxygen (2L)  Activity Tolerance: Patient limited by fatigue Patient left: in chair;with call bell/phone within reach  OT Visit Diagnosis: Unsteadiness on feet (R26.81);Muscle weakness (generalized) (M62.81);Pain Pain - Right/Left: Left Pain - part of body: Ankle and joints of foot                Time: 0086-7619 OT Time Calculation (min): 17 min Charges:    G-Codes:      Malka So 12/26/2016, 11:18 AM  [phone]

## 2016-12-26 NOTE — Progress Notes (Signed)
CARDIAC REHAB PHASE I   Pt in bed resting, states he already walked twice today, declines ambulation at this time. Will follow up tomorrow.   Lenna Sciara, RN, BSN 12/26/2016 3:14 PM

## 2016-12-26 NOTE — Progress Notes (Signed)
[facility] for Coumadin Indication: atrial fibrillation  Allergies  Allergen Reactions  . Lidocaine Other (See Comments)    Passed out - at the dentist's office  . Procaine Hcl Other (See Comments)    Passed out - at the dentist's office    Patient Measurements: Height: 6\' 2"  (188 cm) Weight: 162 lb 11.2 oz (73.8 kg) IBW/kg (Calculated) : 82.2 Heparin Dosing Weight: n/a  Vital Signs: Temp: 98.1 F (36.7 C) (07/31 0412) Temp Source: Oral (07/31 0412) BP: 105/59 (07/31 0412) Pulse Rate: 89 (07/31 0412)  Labs:  Recent Labs  12/24/16 0500 12/25/16 0415 12/26/16 0248  HGB 8.4* 8.2* 8.5*  HCT 27.4* 26.4* 27.4*  PLT 202 201 217  LABPROT 24.3* 24.8* 26.8*  INR 2.15 2.19 2.42  CREATININE 2.10* 1.97* 2.08*    Estimated Creatinine Clearance: 32.5 mL/min (A) (by C-G formula based on SCr of 2.08 mg/dL (H)).  Assessment: 76 yo male on chronic Coumadin for afib. INR 1.6 low on admit but now trend up 2.1> 2.4 at goal  On warfarin 5mg  daily  Little trend up and increasing amio will back down dose today   Hgb down slightly from 9.7>8.5, no bleeding issues noted. Restarted po amiodarone 200mg  BID 7/28> inc 400mg  bid 7/31  Patient out patient warfarin dosing has combination of 3/6mg  - recently changed post d/c from Rehab.  Patient only has warfarin 5mg  tablets at home and has been taking 5mg  daily.   He has a large full bottle of warfarin 5mg  and wants to use those tablets at DC.    Goal of Therapy:  INR 2-3 Monitor platelets by anticoagulation protocol: Yes   Plan:  1. Coumadin 2.5mg  x1 2. Daily INR proabably dc home on combo 2.5mg  TTH/5mg  QD  Bonnita Nasuti Pharm.D. CPP, BCPS Clinical Pharmacist [phone] 12/26/2016 7:55 AM

## 2016-12-26 NOTE — Progress Notes (Signed)
SATURATION QUALIFICATIONS: (This note is used to comply with regulatory documentation for home oxygen)  Patient Saturations on Room Air at Rest = 78%  Patient Saturations on Room Air while Ambulating = 78%  Patient Saturations on 3 Liters of oxygen while Ambulating = 84-88%  Please briefly explain why patient needs home oxygen: Pt not able to maintain Sp02 in safe ranges on RA during mobility.    Wray Kearns, [place], DPT [phone]

## 2016-12-26 NOTE — Progress Notes (Signed)
[facility]  New patient with [facility] this admission.    [facility] will provide home infusion pharmacy services for home Milrinone.  [facility] will partner with [facility] for [facility].  [facility] is prepared for DC to home when ordered.  If patient discharges after hours, please call [phone].   Jason Choi 12/26/2016, 5:18 PM

## 2016-12-26 NOTE — Progress Notes (Signed)
RN offered to ambulate with patient.  Patient stated it was too early.

## 2016-12-26 NOTE — Discharge Instructions (Signed)

## 2016-12-26 NOTE — Progress Notes (Signed)
Physical Therapy Treatment Patient Details Name: Jason Choi MRN: [id number] DOB: 1941-05-02 Today's Date: 12/26/2016    History of Present Illness Patient is a 76 y/o male who presents with SOB. 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 CXR-pulmonary edema. PMH includes CHF, polio, ischemic cardiomyopathy, right foot wound, CAD s/p CABG, MI, AICD, CKD, A-fib    PT Comments    Patient reports not feeling well today- not getting much sleep last night. Increased time to perform all mobility today due to DOE and fatigue. Sp02 dropped to 78% on RA just sitting EOB trying to donn shoes with assist. Donned 3L/min 02 and it took a few mins for oxygen saturations to increase to >90% with cues for pursed lip breathing. Sp02 dropped to 84% on 3L.min 02. Pt fatigues very easily. Encouraged to sit in chair for all meals. Will continue to follow. Most likely pt will need 02 at home.     Follow Up Recommendations  Home health PT;Supervision for mobility/OOB;Supervision/Assistance - 24 hour     Equipment Recommendations  None recommended by PT    Recommendations for Other Services       Precautions / Restrictions Precautions Precautions: Fall Precaution Comments: watch 02; Rt foot wound Restrictions Weight Bearing Restrictions: No    Mobility  Bed Mobility Overal bed mobility: Needs Assistance Bed Mobility: Supine to Sit     Supine to sit: Modified independent (Device/Increase time);HOB elevated     General bed mobility comments: No assist needed for bed mobility. Use of rails.  Transfers Overall transfer level: Needs assistance Equipment used: Rolling walker (2 wheeled) Transfers: Sit to/from Stand Sit to Stand: Min guard         General transfer comment: Min guard for safety. Increased time to stand. Stood from [employer]. Transferred to chair post ambulation.  Ambulation/Gait Ambulation/Gait assistance: Min assist Ambulation Distance (Feet): 120  Feet Assistive device: Rolling walker (2 wheeled) Gait Pattern/deviations: Step-through pattern;Decreased stride length;Trunk flexed Gait velocity: decreased   General Gait Details: Slow, unsteady gait. 3-4/4 DOE. Sp02 dropped to 84% on 3L/min 02. HR ranged from 80-120 bpm.   Stairs            Wheelchair Mobility    Modified Rankin (Stroke Patients Only)       Balance Overall balance assessment: Needs assistance Sitting-balance support: Feet supported;No upper extremity supported Sitting balance-Leahy Scale: Good     Standing balance support: During functional activity Standing balance-Leahy Scale: Poor Standing balance comment: Requires UE support in standing.                             Cognition Arousal/Alertness: Awake/alert Behavior During Therapy: WFL for tasks assessed/performed Overall Cognitive Status: Within Functional Limits for tasks assessed                                        Exercises      General Comments        Pertinent Vitals/Pain Pain Assessment: Faces Faces Pain Scale: Hurts a little bit Pain Location: right foot wound Pain Descriptors / Indicators: Aching;Sore Pain Intervention(s): Monitored during session;Repositioned    Home Living                      Prior Function            PT  Goals (current goals can now be found in the care plan section) Progress towards PT goals: Progressing toward goals    Frequency    Min 3X/week      PT Plan Current plan remains appropriate    Co-evaluation              AM-PAC PT "6 Clicks" Daily Activity  Outcome Measure  Difficulty turning over in bed (including adjusting bedclothes, sheets and blankets)?: None Difficulty moving from lying on back to sitting on the side of the bed? : None Difficulty sitting down on and standing up from a chair with arms (e.g., wheelchair, bedside commode, etc,.)?: None Help needed moving to and from a bed  to chair (including a wheelchair)?: A Little Help needed walking in hospital room?: A Little Help needed climbing 3-5 steps with a railing? : A Lot 6 Click Score: 20    End of Session Equipment Utilized During Treatment: Oxygen;Gait belt Activity Tolerance: Treatment limited secondary to medical complications (Comment) (drop in Sp02) Patient left: with call bell/phone within reach;in chair;with family/visitor present (chair alarm bed under pt but no box) Nurse Communication: Mobility status PT Visit Diagnosis: Unsteadiness on feet (R26.81);Other (comment);Muscle weakness (generalized) (M62.81)     Time: 5053-9767 PT Time Calculation (min) (ACUTE ONLY): 38 min  Charges:  $Gait Training: 8-22 mins $Therapeutic Exercise: 8-22 mins $Therapeutic Activity: 8-22 mins                    G Codes:       Wray Kearns, PT, DPT [phone]     Marguarite Arbour A Jesyka Slaght 12/26/2016, 9:58 AM

## 2016-12-26 NOTE — Progress Notes (Signed)
Telemetry called to notify RN that patient had 21 beats of wide QRS tachycardia.  RN into check on patient.  Per patient had just got done using urinal and was asymptomatic.  Will continue to monitor.

## 2016-12-27 DIAGNOSIS — E871 Hypo-osmolality and hyponatremia: Secondary | ICD-10-CM

## 2016-12-27 DIAGNOSIS — E875 Hyperkalemia: Secondary | ICD-10-CM

## 2016-12-27 DIAGNOSIS — Z7189 Other specified counseling: Secondary | ICD-10-CM

## 2016-12-27 DIAGNOSIS — Z515 Encounter for palliative care: Secondary | ICD-10-CM

## 2016-12-27 DIAGNOSIS — R531 Weakness: Secondary | ICD-10-CM

## 2016-12-27 HISTORY — DX: Hypo-osmolality and hyponatremia: E87.1

## 2016-12-27 HISTORY — DX: Hyperkalemia: E87.5

## 2016-12-27 LAB — COOXEMETRY PANEL
CARBOXYHEMOGLOBIN: 2.6 % — AB (ref 0.5–1.5)
Carboxyhemoglobin: 2 % — ABNORMAL HIGH (ref 0.5–1.5)
METHEMOGLOBIN: 0.6 % (ref 0.0–1.5)
Methemoglobin: 0.6 % (ref 0.0–1.5)
O2 SAT: 87.1 %
O2 Saturation: 52.6 %
TOTAL HEMOGLOBIN: 8.2 g/dL — AB (ref 12.0–16.0)
TOTAL HEMOGLOBIN: 8.5 g/dL — AB (ref 12.0–16.0)

## 2016-12-27 LAB — PROTIME-INR
INR: 2.34
Prothrombin Time: 26.1 seconds — ABNORMAL HIGH (ref 11.4–15.2)

## 2016-12-27 LAB — BASIC METABOLIC PANEL
ANION GAP: 10 (ref 5–15)
BUN: 73 mg/dL — ABNORMAL HIGH (ref 6–20)
CHLORIDE: 87 mmol/L — AB (ref 101–111)
CO2: 33 mmol/L — AB (ref 22–32)
Calcium: 9.1 mg/dL (ref 8.9–10.3)
Creatinine, Ser: 2.22 mg/dL — ABNORMAL HIGH (ref 0.61–1.24)
GFR calc non Af Amer: 27 mL/min — ABNORMAL LOW (ref >60)
GFR, EST AFRICAN AMERICAN: 32 mL/min — AB (ref >60)
GLUCOSE: 113 mg/dL — AB (ref 65–99)
Potassium: 4.5 mmol/L (ref 3.5–5.1)
Sodium: 130 mmol/L — ABNORMAL LOW (ref 135–145)

## 2016-12-27 MED ORDER — POTASSIUM CHLORIDE CRYS ER 20 MEQ PO TBCR: 40.0000 meq | EXTENDED_RELEASE_TABLET | Freq: Every day | ORAL | 12 refills | Status: DC

## 2016-12-27 MED ORDER — TORSEMIDE 20 MG PO TABS: ORAL_TABLET | ORAL | 6 refills | Status: DC

## 2016-12-27 MED ORDER — ATORVASTATIN CALCIUM 40 MG PO TABS: 40.0000 mg | ORAL_TABLET | Freq: Every evening | ORAL | 3 refills | Status: DC

## 2016-12-27 MED ORDER — AMIODARONE HCL 200 MG PO TABS: ORAL_TABLET | ORAL | 6 refills | Status: DC

## 2016-12-27 MED ORDER — WARFARIN SODIUM 5 MG PO TABS: ORAL_TABLET | ORAL | 6 refills | Status: DC

## 2016-12-27 NOTE — Progress Notes (Signed)
Physical Therapy Treatment Patient Details Name: Jason Choi MRN: [id number] DOB: 03/04/1941 Today's Date: 12/27/2016    History of Present Illness Patient is a 76 y/o male who presents with SOB. 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 CXR-pulmonary edema. PMH includes CHF, polio, ischemic cardiomyopathy, right foot wound, CAD s/p CABG, MI, AICD, CKD, A-fib    PT Comments    Patient seen for mobility progression. Patient ambulated on 2 liters with desaturation to 78%, increased to 3 liters and improved to mid 80s with activity, required 4 liters and seated rest break to improve to 90%. Educated patient on pursed lip breathing and energy conservation techniques. Patient receptive. Will continue to see and progress as tolerated.   Follow Up Recommendations  Home health PT;Supervision for mobility/OOB;Supervision/Assistance - 24 hour     Equipment Recommendations  None recommended by PT    Recommendations for Other Services OT consult     Precautions / Restrictions Precautions Precautions: Fall Precaution Comments: watch 02; Rt foot wound Restrictions Weight Bearing Restrictions: No    Mobility  Bed Mobility Overal bed mobility: Needs Assistance Bed Mobility: Supine to Sit     Supine to sit: Modified independent (Device/Increase time);HOB elevated Sit to supine: Modified independent (Device/Increase time);HOB elevated   General bed mobility comments: No assist needed for bed mobility. Use of rails.  Transfers Overall transfer level: Needs assistance Equipment used: Rolling walker (2 wheeled) Transfers: Sit to/from Stand Sit to Stand: Supervision         General transfer comment: min guard for safety, increased time  Ambulation/Gait Ambulation/Gait assistance: Supervision Ambulation Distance (Feet): 210 Feet Assistive device: Rolling walker (2 wheeled) Gait Pattern/deviations: Step-through pattern;Decreased stride length;Trunk flexed Gait  velocity: decreased Gait velocity interpretation: <1.8 ft/sec, indicative of risk for recurrent falls General Gait Details: slow cadence some instability noted, fatigues with increased activity. Ambulated on 2 liters with saturations dropping to upper 70s, increased to 3 liters with minimal response, 4 liters with seated rest to rebound >90%   Stairs Stairs:  (unable to attempt due to fatigue)          Wheelchair Mobility    Modified Rankin (Stroke Patients Only)       Balance Overall balance assessment: Needs assistance Sitting-balance support: Feet supported;No upper extremity supported Sitting balance-Leahy Scale: Good     Standing balance support: During functional activity Standing balance-Leahy Scale: Poor Standing balance comment: [place] suppoort upon fatigue, but able to static stand without assist                            Cognition Arousal/Alertness: Awake/alert Behavior During Therapy: WFL for tasks assessed/performed Overall Cognitive Status: Within Functional Limits for tasks assessed                                        Exercises      General Comments General comments (skin integrity, edema, etc.): HR stable 80s-100s with activity, Saturations dropping to 78% on 2 liters with increased ambulation, 80s with 3 liters, 90% with 4 liters and seated rest break      Pertinent Vitals/Pain Faces Pain Scale: Hurts a little bit Pain Location: right foot wound Pain Descriptors / Indicators: Aching;Sore    Home Living  Prior Function            PT Goals (current goals can now be found in the care plan section) Acute Rehab PT Goals Patient Stated Goal: to return home ASAP PT Goal Formulation: With patient Time For Goal Achievement: 01/08/17 Potential to Achieve Goals: Fair Progress towards PT goals: Progressing toward goals    Frequency    Min 3X/week      PT Plan Current plan remains  appropriate    Co-evaluation              AM-PAC PT "6 Clicks" Daily Activity  Outcome Measure  Difficulty turning over in bed (including adjusting bedclothes, sheets and blankets)?: None Difficulty moving from lying on back to sitting on the side of the bed? : None Difficulty sitting down on and standing up from a chair with arms (e.g., wheelchair, bedside commode, etc,.)?: None Help needed moving to and from a bed to chair (including a wheelchair)?: A Little Help needed walking in hospital room?: A Little Help needed climbing 3-5 steps with a railing? : A Lot 6 Click Score: 20    End of Session Equipment Utilized During Treatment: Oxygen;Gait belt Activity Tolerance: Patient limited by fatigue;Treatment limited secondary to medical complications (Comment) (increased DOE and desaturations) Patient left: in bed;with call bell/phone within reach;with bed alarm set Nurse Communication: Mobility status PT Visit Diagnosis: Unsteadiness on feet (R26.81);Other (comment);Muscle weakness (generalized) (M62.81)     Time: 1610-9604 PT Time Calculation (min) (ACUTE ONLY): 21 min  Charges:  $Gait Training: 8-22 mins                    G Codes:       Alben Deeds, PT DPT  Board Certified Neurologic Specialist [place] 12/27/2016, 10:40 AM

## 2016-12-27 NOTE — Progress Notes (Addendum)
Advanced Heart Failure Rounding Note  PCP: Clyde Lundborg Primary Cardiologist: Dr. Martinique HF Cardiology: Dr. [name]  Subjective:    Admitted 12/21/16 with volume overload, concern for low output HF.  Started on milrinone on 7/28 for low output with co-ox 45%. Co ox 66% today. Remains on milrinone 0.25 mcg and IV lasix 120 mg BID, had dose of metolazone yesterday. Weight down 4 pounds total. Creatinine 2.45->2.31->2.18->2.10->1.97->2.08. CVP 10   Started on po Amio for frequent PVC's and NSVT on 7/28. Continues to have frequent PVC's and some NSVT.    Echo with EF 10-15%, RV moderately HK.   PICC exchanged yesterday for single lumen. Ready to go home. Denies SOB, chest pain, orthopnea. CVP 6   Objective:   Weight Range: 166 lb 7.2 oz (75.5 kg) Body mass index is 21.37 kg/m.   Vital Signs:   Temp:  [97.2 F (36.2 C)-98 F (36.7 C)] 98 F (36.7 C) (07/31 1942) Pulse Rate:  [84-100] 84 (08/01 0021) Resp:  [18-26] 26 (08/01 0500) BP: (96-100)/(60-68) 100/60 (08/01 0021) SpO2:  [94 %-98 %] 97 % (08/01 0021) Weight:  [166 lb 7.2 oz (75.5 kg)] 166 lb 7.2 oz (75.5 kg) (08/01 0500) Last BM Date:  (per pt* last BM Friday or Saturday and was very small )  Weight change: Filed Weights   12/25/16 0503 12/26/16 0429 12/27/16 0500  Weight: 162 lb 8 oz (73.7 kg) 162 lb 11.2 oz (73.8 kg) 166 lb 7.2 oz (75.5 kg)    Intake/Output:   Intake/Output Summary (Last 24 hours) at 12/27/16 0736 Last data filed at 12/27/16 0500  Gross per 24 hour  Intake              741 ml  Output              525 ml  Net              216 ml      Physical Exam  CVP 6  General: Elderly male, lying in bed.  HEENT: Normal.   Neck: supple. JVP 8 cm Carotids 2+ bilat; no bruits. No lymphadenopathy or thryomegaly appreciated. Cor: PMI laterally displaced. Irregularly irregular. +S3.  Lungs: Clear bilaterally. Normal effort.   Abdomen: Soft, nondistended. No hepatosplenomegaly. No bruits or masses.  + bowel sounds.  Extremities: no cyanosis, clubbing, rash. No peripheral edema.  Neuro: Alert and oriented x 3.  cranial nerves grossly intact. moves all 4 extremities w/o difficulty. Affect flat.    Telemetry   Afib, frequent PVC's. V paced.  - personally reviewed.   EKG   N/A   Labs    CBC  Recent Labs  12/25/16 0415 12/26/16 0248  WBC 7.1 7.0  NEUTROABS 3.8 4.1  HGB 8.2* 8.5*  HCT 26.4* 27.4*  MCV 88.6 90.4  PLT 201 315   Basic Metabolic Panel  Recent Labs  12/26/16 0248 12/27/16 0236  NA 133* 130*  K 3.6 4.5  CL 89* 87*  CO2 35* 33*  GLUCOSE 95 113*  BUN 66* 73*  CREATININE 2.08* 2.22*  CALCIUM 9.0 9.1     BNP: BNP (last 3 results)  Recent Labs  12/04/16 1128 12/14/16 1436 12/21/16 1154  BNP 1,974.7* 1,024.0* 3,403.3*      Imaging   Transthoracic Echocardiography 12/22/16 Study Conclusions  - Left ventricle: The cavity size was moderately dilated. Wall   thickness was normal. Systolic function was severely reduced. The   estimated ejection fraction was 15%. Diffuse hypokinesis.  The   study is not technically sufficient to allow evaluation of LV   diastolic function. - Aortic valve: Sclerosis without stenosis. There was no   regurgitation. - Aorta: Dilated aortic root. Aortic root dimension: 42 mm (ED). - Mitral valve: Mildly thickened leaflets . There was moderate   regurgitation. - Left atrium: Severely dilated. - Right ventricle: The cavity size was mildly dilated. Systolic   function is moderately reduced. Pacer wire or AICD noted in right   ventricle. - Right atrium: Severely dilated. Pacer wire or AICD noted in right   atrium. - Tricuspid valve: There was moderate regurgitation. - Pulmonary arteries: PA peak pressure: 58 mm Hg (S). - Inferior vena cava: The vessel was dilated. The respirophasic   diameter changes were blunted (< 50%), consistent with elevated   central venous pressure.  Impressions:  - Compared to a  prior study in 07/2016, there has been no   significant change.   Medications:     Scheduled Medications: . allopurinol  100 mg Oral Daily  . amiodarone  400 mg Oral BID  . atorvastatin  40 mg Oral q morning - 10a  . feeding supplement (ENSURE ENLIVE)  237 mL Oral QID  . potassium chloride  40 mEq Oral Daily  . sodium chloride flush  10-40 mL Intracatheter Q12H  . torsemide  40 mg Oral BID  . Warfarin - Pharmacist Dosing Inpatient   Does not apply q1800    Infusions: . milrinone 0.25 mcg/kg/min (12/27/16 0331)    PRN Medications: acetaminophen, ALPRAZolam, nitroGLYCERIN, ondansetron (ZOFRAN) IV, sodium chloride flush    Patient Profile   Jason Choi is a 76 y.o. male with h/o CAD s/p CABG, systolic due to ischemic cardiomyopathy Echo 07/2016 LVEF 15% s/p ICD implant 09/2016 - Medtronic, and permanent Afib  Admitted 12/21/16 with worsening SOB and concerns for low output  Assessment/Plan   1. 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 Echo (07/28/16) LVEF 15%, severe LV dilation, severe MR, mild RV dysfunction, Mod RAE, PA peak pressure 49 mm Hg. This is worsened from 6/14 echo with EF 35-40%. S/P Medtronic Single Chamber ICD. Activity less than 1 hour per day. Repeat echo 12/23/15  EF 10-15% moderate RV Hk. - NYHA IIIb - Volume status appears improved, JVP improved, but weight up? CVP 6 - Continue torsemide 40 mg BID.  - Will re draw Co ox this am for accuracy, result was 87% - PICC line exchanged for single lumen. [place] contacted and orders placed for home milrinone.  - No ACE/ARB/Dig/Spiro with CKD.    2. Syncope. - had episode in 2/18 (prior to ICD) - No driving until 12/1189.  - No recurrent syncope.   3. CAD: S/p CABG in 2002.  - No signs or symptoms of ischemia.  - No ASA with stable CAD and need for Coumadin.   - No change.   4. CKD: Stage III-IV - Baseline 2.0 - 2.2 - Creatinine 2.08->2.22  5. Permanent Afib;  failed amiodarone and DCCV.  - rates in the 90-100's. No beta blocker with decompensation.  - Continue warfarin for anticoagulation   6. Hypokalemia - K 4.5 - Continue 74mEq daily. Will check BMET next week.   7. Frequent PVCs with occasional NSVT - averaging >20 PVCs/min (~20%). - Keep K >4.0 Mg > 2.0. Not candidate for b-blocker with low output   - Continue Amio 400 mg BID for one week (last day 01/02/17) then 200 mg BID for one week,  then 200 mg daily.   8. Right foot ulceration - Stable, wound care has seen - He has follow up with wound care outpatient.  - No change.   9. 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  - Continue allopurinol.  - No change.   10. Deconditioning:  - PT saw, recommends home health PT - Orders placed for home health PT.   11. ETOH abuse  - Encouraged complete cessation.  - He says he has not had a drink in 3 weeks.  -No change.    12. Subtherapeutic INR, on admission - INR now 2.34  13. Anxiety - Improved with Xanax 0.25 mg BID prn.  - No change  Will make follow up in the CHF clinic.   Length of Stay: [place], NP  12/27/2016, 7:36 AM  Advanced Heart Failure Team Pager [phone] (M-F; 7a - 4p)  Please contact [facility] for night-coverage after hours (4p -7a ) and weekends on [email or web address]  Patient seen with NP, agree with the above note.  Now on po diuretics.  Think weight likely inaccurate, CVP down to 6.  He remains on milrinone (co-ox probably inaccurate today). See yesterday's discussion regarding home milrinone.  He will go home today with close followup on milrinone 0.25, amiodarone per the above titration, torsemide 40 qam/20 qpm, KCl 40 daily, atorvastatin 40 daily, warfarin to be followed by coumadin clinic.   Will need home health/PT.  Loralie Champagne 12/27/2016 8:58 AM

## 2016-12-27 NOTE — Progress Notes (Signed)
Discussed HF booklet with pt, sister, and [place] aide. Pt receptive and asked appropriate questions. His appetite has been a big issue. Discussed nutritious food that doesn't contain sodium. [place] aide will help with food prep.  [place], ACSM 2:11 PM 12/27/2016

## 2016-12-27 NOTE — Progress Notes (Addendum)
[place] for Coumadin Indication: atrial fibrillation  Allergies  Allergen Reactions  . Lidocaine Other (See Comments)    Passed out - at the dentist's office  . Procaine Hcl Other (See Comments)    Passed out - at the dentist's office    Patient Measurements: Height: 6\' 2"  (188 cm) Weight: 166 lb 7.2 oz (75.5 kg) IBW/kg (Calculated) : 82.2 Heparin Dosing Weight: n/a  Vital Signs: Temp: 98.7 F (37.1 C) (08/01 1145) Temp Source: Oral (08/01 1145) BP: 96/76 (08/01 1145) Pulse Rate: 85 (08/01 1145)  Labs:  Recent Labs  12/25/16 0415 12/26/16 0248 12/27/16 0236  HGB 8.2* 8.5*  --   HCT 26.4* 27.4*  --   PLT 201 217  --   LABPROT 24.8* 26.8* 26.1*  INR 2.19 2.42 2.34  CREATININE 1.97* 2.08* 2.22*    Estimated Creatinine Clearance: 31.2 mL/min (A) (by C-G formula based on SCr of 2.22 mg/dL (H)).  Assessment: 76 yo male on chronic Coumadin for afib. INR 1.6 low on admit but now stable 2.3 this morning, at goal    Restarted po amiodarone 200mg  BID 7/28> inc 400mg  bid 7/31  Patient out patient warfarin dosing has combination of 3/6mg  - recently changed post d/c from Rehab.  Patient only has warfarin 5mg  tablets at home and has been taking 5mg  daily.   He has a large full bottle of warfarin 5mg  and wants to use those tablets at DC.   Goal of Therapy:  INR 2-3 Monitor platelets by anticoagulation protocol: Yes   Plan:  1. Recommended d/c dose - 2.5mg  Tu/TR, 5mg  other days 2. Home today  Erin Hearing PharmD., BCPS Clinical Pharmacist Pager [phone] 12/27/2016 1:46 PM

## 2016-12-27 NOTE — Plan of Care (Signed)
Problem: Pain Managment: Goal: General experience of comfort will improve Outcome: Progressing Verbalized understanding   

## 2016-12-27 NOTE — Progress Notes (Signed)
Patient ID: Jason Choi, male   DOB: [birth year], 76 y.o.   MRN: [id number]   This NP visited patient at the bedside as a follow up to  yesterday's [place], sister and home CNA present.  Plan is for patient to discharge home today on milrinone drip. Home health services will be involved for management of milrinone. His sister will remain in town for several more days. However at that point in time he will only have the assistance of out-of-pocket homecare workers 4-5 hours a day.  Discussed outpatient home palliative services. Patient is open to and I've made a referral to hospice and palliative care of [facility].  I discussed with patient and family my concern that as disease progresses his care needs will increase.  Discussed with patient the importance of continued conversation with family and their  medical providers regarding overall plan of care and treatment options,  ensuring decisions are within the context of the patients values and GOCs.  Questions and concerns addressed  Time in   9:45 AM        Time out  10:20 AM    Total time spent on the unit was 35 minutes   Discussed with Dr [name]  Greater than 50% of the time was spent in counseling and coordination of care  Wadie Lessen NP  Palliative Medicine Team Team Phone # [phone] Pager [phone]

## 2016-12-28 NOTE — Discharge Summary (Signed)
Advanced Heart Failure Discharge Note  Discharge Summary   Patient ID: Jason Choi MRN: [id number], DOB/AGE: 1940-07-10 76 y.o. Admit date: 12/21/2016 D/C date:     12/28/2016   Primary Discharge Diagnoses:    Hospital Course: Jason Choi is a 76 y.o. male with h/o CAD s/p CABG, systolic due to ischemic cardiomyopathy Echo 07/2016 LVEF 15% s/p ICD implant 09/2016 - Medtr[employer], and permanent Afib  Pt previously admitted 12/04/2016 with fatigue and weakness. Due to hypotension, dig, bb and spiro stopped. Pacing rate increased. Discharged early from [facility] as he demanded to go home. Discharge weight was 163 lbs.   Seen in HF clinic 12/13/16. Weight stable from discharge at that visit at 163 lbs. Concerns rased by [facility] as weight had decreased from 185 -> 159 over 1 month period. Torsemide cut back with weight loss and poor nutrition. Concerned for recurrent low output HF. RHC planned pending labs.   Pt presented to [facility] on 12/21/16 with worsening SOB over 1 week. Required CPAP initiation by EMS who were called to his house.  Pertinent labs on admission include Cr 2.45, K 4.8, BNP 3403, Hgb 9.7. Initial Troponin 0.16. CXR with worsening bilateral pulmonary edema.   With concern for recurrent low output, he was started on milrinone, initial co ox was 45%. Milrinone was continued at discharge. He was diuresed with IV Lasix 120 mg BID and metolazone. He was noted to have frequent PVC's and some runs of NSVT after starting milrinone, and was therefore started on po Amiodarone. He will continue po Amiodarone 400 mg BID for one week (last dose 01/02/17), then 200 mg BID starting on 01/03/17 for 7 days, then 200 mg daily.   Palliative care consult was obtained during hospitalization and referral was made for outpatient services.   He was seen today by Dr. [name] and deemed suitable for discharge.    Discharge Weight Range: 166-162 pounds.  Discharge Vitals: Blood pressure 96/76, pulse 85,  temperature 98.7 F (37.1 C), temperature source Oral, resp. rate (!) 24, height 6\' 2"  (1.88 m), weight 166 lb 7.2 oz (75.5 kg), SpO2 98 %.  Labs: Lab Results  Component Value Date   WBC 7.0 12/26/2016   HGB 8.5 (L) 12/26/2016   HCT 27.4 (L) 12/26/2016   MCV 90.4 12/26/2016   PLT 217 12/26/2016    Recent Labs Lab 12/22/16 0523  12/27/16 0236  NA 136  < > 130*  K 4.5  < > 4.5  CL 98*  < > 87*  CO2 27  < > 33*  BUN 52*  < > 73*  CREATININE 2.31*  < > 2.22*  CALCIUM 8.7*  < > 9.1  PROT 5.5*  --   --   BILITOT 1.0  --   --   ALKPHOS 78  --   --   ALT 26  --   --   AST 43*  --   --   GLUCOSE 98  < > 113*  < > = values in this interval not displayed. Lab Results  Component Value Date   CHOL 149 01/22/2015   HDL 46 01/22/2015   LDLCALC 89 01/22/2015   TRIG 70 01/22/2015   BNP (last 3 results)  Recent Labs  12/04/16 1128 12/14/16 1436 12/21/16 1154  BNP 1,974.7* 1,024.0* 3,403.3*    ProBNP (last 3 results) No results for input(s): PROBNP in the last 8760 hours.   Diagnostic Studies/Procedures   Dg Chest [facility] 1 View  Result  Date: 12/26/2016 CLINICAL DATA:  Status post central line placement. Patient is also experiencing cough and shortness of breath and has history of acute on chronic CHF. Former smoker. EXAM: PORTABLE CHEST 1 VIEW COMPARISON:  Portable chest x-ray of December 21, 2016 FINDINGS: The right-sided PICC line tip projects over the junction of the middle and distal thirds of the SVC. There is no pneumothorax. The cardiac silhouette is enlarged. The pulmonary vascularity is engorged. The interstitial markings are increased. The patient has undergone previous CABG. The ICD is in stable position. There is calcification in the wall of the aortic arch. IMPRESSION: CHF with interstitial and alveolar edema. Reasonable positioning of the right-sided PICC line. Thoracic aortic atherosclerosis. Electronically Signed   By: David  Martinique M.D.   On: 12/26/2016 11:36     Discharge Medications   Allergies as of 12/27/2016      Reactions   Lidocaine Other (See Comments)   Passed out - at the dentist's office   Procaine Hcl Other (See Comments)   Passed out - at the dentist's office      Medication List    TAKE these medications   acetaminophen 325 MG tablet Commonly known as:  TYLENOL Take 2 tablets (650 mg total) by mouth every 4 (four) hours as needed for headache or mild pain.   allopurinol 100 MG tablet Commonly known as:  ZYLOPRIM Take 1 tablet (100 mg total) by mouth daily.   amiodarone 200 MG tablet Commonly known as:  PACERONE Take 400 mg BID for 7 days (to end 01/02/17), then 200 mg BID for 7 days, then 200 mg daily.   atorvastatin 40 MG tablet Commonly known as:  LIPITOR Take 1 tablet (40 mg total) by mouth every evening. What changed:  when to take this   MULTI-VITAMIN DAILY Tabs Take 1 tablet by mouth daily.   nitroGLYCERIN 0.4 MG SL tablet Commonly known as:  NITROSTAT Place 1 tablet (0.4 mg total) under the tongue every 5 (five) minutes x 3 doses as needed for chest pain.   potassium chloride SA 20 MEQ tablet Commonly known as:  K-DUR,KLOR-CON Take 2 tablets (40 mEq total) by mouth daily. What changed:  how much to take   torsemide 20 MG tablet Commonly known as:  DEMADEX Take 40 mg in the morning and 20 mg in the evening. What changed:  how much to take  how to take this  when to take this  additional instructions   warfarin 5 MG tablet Commonly known as:  COUMADIN Take 5 mg daily, except for Tues and Thurs. Take 2.5 mg daily. What changed:  how much to take  how to take this  when to take this  additional instructions       Disposition   The patient will be discharged in stable condition to home. Discharge Instructions    (HEART FAILURE PATIENTS) Call MD:  Anytime you have any of the following symptoms: 1) 3 pound weight gain in 24 hours or 5 pounds in 1 week 2) shortness of breath, with or  without a dry hacking cough 3) swelling in the hands, feet or stomach 4) if you have to sleep on extra pillows at night in order to breathe.    Complete by:  As directed    Call MD for:  temperature >100.4    Complete by:  As directed    Diet - low sodium heart healthy    Complete by:  As directed    Heart Failure  patients record your daily weight using the same scale at the same time of day    Complete by:  As directed    Increase activity slowly    Complete by:  As directed      Follow-up Information    Home, [facility] Follow up.   Specialty:  Home Health Services Why:  Registered Nurse and Physical Therapy Contact information: [place] [address] [phone]        Larey Dresser, MD Follow up on 01/02/2017.   Specialty:  Cardiology Why:  at 10:30 am for hospital follow up. [place] code [phone].  Contact information: [place] [address] [phone]             Duration of Discharge Encounter: Greater than 35 minutes   Signed, Arbutus Leas, NP 12/28/2016, 11:05 AM

## 2016-12-29 ENCOUNTER — Telehealth: Payer: Self-pay | Admitting: Cardiology

## 2016-12-29 NOTE — Telephone Encounter (Signed)
Returned call to [employer] with [facility].Spoke to our pharmacist Erasmo Downer she advised patient will need a INR 8/6 or 8/7.Tiffany will call Dr.McLean for the other questions.

## 2016-12-29 NOTE — Telephone Encounter (Signed)
New Message  Tiffany from [facility] call to get orders for pt cardiac monitor. She would also like to know when the pt has their next INR check and the pt bp range from the IV med. Please call back to discuss

## 2017-01-01 ENCOUNTER — Emergency Department (HOSPITAL_COMMUNITY): Payer: Medicare Other

## 2017-01-01 ENCOUNTER — Encounter (HOSPITAL_COMMUNITY): Payer: Self-pay | Admitting: *Deleted

## 2017-01-01 ENCOUNTER — Inpatient Hospital Stay (HOSPITAL_COMMUNITY)
Admission: EM | Admit: 2017-01-01 | Discharge: 2017-01-04 | DRG: 682 | Disposition: A | Discharge status: Hospice | Payer: Medicare Other | Attending: Internal Medicine | Admitting: Internal Medicine

## 2017-01-01 DIAGNOSIS — I5043 Acute on chronic combined systolic (congestive) and diastolic (congestive) heart failure: Secondary | ICD-10-CM | POA: Diagnosis present

## 2017-01-01 DIAGNOSIS — I472 Ventricular tachycardia: Secondary | ICD-10-CM | POA: Diagnosis present

## 2017-01-01 DIAGNOSIS — I5022 Chronic systolic (congestive) heart failure: Secondary | ICD-10-CM | POA: Diagnosis not present

## 2017-01-01 DIAGNOSIS — W010XXA Fall on same level from slipping, tripping and stumbling without subsequent striking against object, initial encounter: Secondary | ICD-10-CM | POA: Diagnosis present

## 2017-01-01 DIAGNOSIS — I480 Paroxysmal atrial fibrillation: Secondary | ICD-10-CM | POA: Diagnosis present

## 2017-01-01 DIAGNOSIS — E875 Hyperkalemia: Secondary | ICD-10-CM | POA: Diagnosis not present

## 2017-01-01 DIAGNOSIS — R531 Weakness: Secondary | ICD-10-CM

## 2017-01-01 DIAGNOSIS — N189 Chronic kidney disease, unspecified: Secondary | ICD-10-CM

## 2017-01-01 DIAGNOSIS — I5023 Acute on chronic systolic (congestive) heart failure: Secondary | ICD-10-CM | POA: Diagnosis not present

## 2017-01-01 DIAGNOSIS — E860 Dehydration: Secondary | ICD-10-CM | POA: Diagnosis present

## 2017-01-01 DIAGNOSIS — D631 Anemia in chronic kidney disease: Secondary | ICD-10-CM | POA: Diagnosis not present

## 2017-01-01 DIAGNOSIS — I10 Essential (primary) hypertension: Secondary | ICD-10-CM | POA: Diagnosis present

## 2017-01-01 DIAGNOSIS — Z7901 Long term (current) use of anticoagulants: Secondary | ICD-10-CM

## 2017-01-01 DIAGNOSIS — Z87891 Personal history of nicotine dependence: Secondary | ICD-10-CM

## 2017-01-01 DIAGNOSIS — M19011 Primary osteoarthritis, right shoulder: Secondary | ICD-10-CM | POA: Diagnosis present

## 2017-01-01 DIAGNOSIS — W19XXXA Unspecified fall, initial encounter: Secondary | ICD-10-CM | POA: Insufficient documentation

## 2017-01-01 DIAGNOSIS — R1012 Left upper quadrant pain: Secondary | ICD-10-CM

## 2017-01-01 DIAGNOSIS — I252 Old myocardial infarction: Secondary | ICD-10-CM

## 2017-01-01 DIAGNOSIS — M109 Gout, unspecified: Secondary | ICD-10-CM | POA: Diagnosis present

## 2017-01-01 DIAGNOSIS — Z8249 Family history of ischemic heart disease and other diseases of the circulatory system: Secondary | ICD-10-CM

## 2017-01-01 DIAGNOSIS — Z888 Allergy status to other drugs, medicaments and biological substances status: Secondary | ICD-10-CM

## 2017-01-01 DIAGNOSIS — W2209XA Striking against other stationary object, initial encounter: Secondary | ICD-10-CM | POA: Diagnosis present

## 2017-01-01 DIAGNOSIS — Z95810 Presence of automatic (implantable) cardiac defibrillator: Secondary | ICD-10-CM

## 2017-01-01 DIAGNOSIS — E7800 Pure hypercholesterolemia, unspecified: Secondary | ICD-10-CM | POA: Diagnosis present

## 2017-01-01 DIAGNOSIS — I482 Chronic atrial fibrillation: Secondary | ICD-10-CM | POA: Diagnosis present

## 2017-01-01 DIAGNOSIS — E871 Hypo-osmolality and hyponatremia: Secondary | ICD-10-CM | POA: Diagnosis not present

## 2017-01-01 DIAGNOSIS — I255 Ischemic cardiomyopathy: Secondary | ICD-10-CM | POA: Diagnosis present

## 2017-01-01 DIAGNOSIS — Z66 Do not resuscitate: Secondary | ICD-10-CM | POA: Diagnosis present

## 2017-01-01 DIAGNOSIS — I2510 Atherosclerotic heart disease of native coronary artery without angina pectoris: Secondary | ICD-10-CM | POA: Diagnosis present

## 2017-01-01 DIAGNOSIS — N179 Acute kidney failure, unspecified: Secondary | ICD-10-CM | POA: Diagnosis not present

## 2017-01-01 DIAGNOSIS — N184 Chronic kidney disease, stage 4 (severe): Secondary | ICD-10-CM | POA: Diagnosis present

## 2017-01-01 DIAGNOSIS — E785 Hyperlipidemia, unspecified: Secondary | ICD-10-CM | POA: Diagnosis present

## 2017-01-01 DIAGNOSIS — Y92009 Unspecified place in unspecified non-institutional (private) residence as the place of occurrence of the external cause: Secondary | ICD-10-CM

## 2017-01-01 DIAGNOSIS — R0781 Pleurodynia: Secondary | ICD-10-CM | POA: Diagnosis present

## 2017-01-01 DIAGNOSIS — I272 Pulmonary hypertension, unspecified: Secondary | ICD-10-CM | POA: Diagnosis present

## 2017-01-01 DIAGNOSIS — I130 Hypertensive heart and chronic kidney disease with heart failure and stage 1 through stage 4 chronic kidney disease, or unspecified chronic kidney disease: Secondary | ICD-10-CM | POA: Diagnosis present

## 2017-01-01 DIAGNOSIS — Z515 Encounter for palliative care: Secondary | ICD-10-CM | POA: Diagnosis present

## 2017-01-01 DIAGNOSIS — Z7189 Other specified counseling: Secondary | ICD-10-CM

## 2017-01-01 DIAGNOSIS — I5084 End stage heart failure: Secondary | ICD-10-CM | POA: Diagnosis present

## 2017-01-01 DIAGNOSIS — I493 Ventricular premature depolarization: Secondary | ICD-10-CM | POA: Diagnosis present

## 2017-01-01 DIAGNOSIS — L97411 Non-pressure chronic ulcer of right heel and midfoot limited to breakdown of skin: Secondary | ICD-10-CM | POA: Diagnosis present

## 2017-01-01 DIAGNOSIS — Z951 Presence of aortocoronary bypass graft: Secondary | ICD-10-CM

## 2017-01-01 HISTORY — DX: Hypo-osmolality and hyponatremia: E87.1

## 2017-01-01 HISTORY — DX: Hyperkalemia: E87.5

## 2017-01-01 LAB — URINALYSIS, ROUTINE W REFLEX MICROSCOPIC
BILIRUBIN URINE: NEGATIVE
Glucose, UA: NEGATIVE mg/dL
Hgb urine dipstick: NEGATIVE
KETONES UR: NEGATIVE mg/dL
LEUKOCYTES UA: NEGATIVE
NITRITE: NEGATIVE
PH: 5 (ref 5.0–8.0)
PROTEIN: NEGATIVE mg/dL
Specific Gravity, Urine: 1.014 (ref 1.005–1.030)

## 2017-01-01 LAB — PROTIME-INR
INR: 1.7
PROTHROMBIN TIME: 20.1 s — AB (ref 11.4–15.2)

## 2017-01-01 LAB — BASIC METABOLIC PANEL
ANION GAP: 13 (ref 5–15)
BUN: 100 mg/dL — ABNORMAL HIGH (ref 6–20)
CALCIUM: 9.3 mg/dL (ref 8.9–10.3)
CO2: 26 mmol/L (ref 22–32)
Chloride: 90 mmol/L — ABNORMAL LOW (ref 101–111)
Creatinine, Ser: 4.11 mg/dL — ABNORMAL HIGH (ref 0.61–1.24)
GFR, EST AFRICAN AMERICAN: 15 mL/min — AB (ref >60)
GFR, EST NON AFRICAN AMERICAN: 13 mL/min — AB (ref >60)
GLUCOSE: 130 mg/dL — AB (ref 65–99)
POTASSIUM: 6 mmol/L — AB (ref 3.5–5.1)
SODIUM: 129 mmol/L — AB (ref 135–145)

## 2017-01-01 LAB — CBC WITH DIFFERENTIAL/PLATELET
Basophils Absolute: 0 10*3/uL (ref 0.0–0.1)
Basophils Relative: 0 %
EOS ABS: 0 10*3/uL (ref 0.0–0.7)
EOS PCT: 1 %
HCT: 28.8 % — ABNORMAL LOW (ref 39.0–52.0)
Hemoglobin: 8.9 g/dL — ABNORMAL LOW (ref 13.0–17.0)
LYMPHS ABS: 0.9 10*3/uL (ref 0.7–4.0)
Lymphocytes Relative: 10 %
MCH: 27.3 pg (ref 26.0–34.0)
MCHC: 30.9 g/dL (ref 30.0–36.0)
MCV: 88.3 fL (ref 78.0–100.0)
MONOS PCT: 9 %
Monocytes Absolute: 0.8 10*3/uL (ref 0.1–1.0)
Neutro Abs: 7 10*3/uL (ref 1.7–7.7)
Neutrophils Relative %: 80 %
PLATELETS: 326 10*3/uL (ref 150–400)
RBC: 3.26 MIL/uL — ABNORMAL LOW (ref 4.22–5.81)
RDW: 19 % — ABNORMAL HIGH (ref 11.5–15.5)
WBC: 8.8 10*3/uL (ref 4.0–10.5)

## 2017-01-01 LAB — COMPREHENSIVE METABOLIC PANEL
ALK PHOS: 87 U/L (ref 38–126)
ALT: 26 U/L (ref 17–63)
ANION GAP: 13 (ref 5–15)
AST: 48 U/L — ABNORMAL HIGH (ref 15–41)
Albumin: 3.3 g/dL — ABNORMAL LOW (ref 3.5–5.0)
BUN: 95 mg/dL — ABNORMAL HIGH (ref 6–20)
CALCIUM: 9.3 mg/dL (ref 8.9–10.3)
CHLORIDE: 91 mmol/L — AB (ref 101–111)
CO2: 25 mmol/L (ref 22–32)
Creatinine, Ser: 3.79 mg/dL — ABNORMAL HIGH (ref 0.61–1.24)
GFR, EST AFRICAN AMERICAN: 17 mL/min — AB (ref >60)
GFR, EST NON AFRICAN AMERICAN: 14 mL/min — AB (ref >60)
Glucose, Bld: 106 mg/dL — ABNORMAL HIGH (ref 65–99)
Potassium: 6.5 mmol/L (ref 3.5–5.1)
SODIUM: 129 mmol/L — AB (ref 135–145)
Total Bilirubin: 1.5 mg/dL — ABNORMAL HIGH (ref 0.3–1.2)
Total Protein: 6.2 g/dL — ABNORMAL LOW (ref 6.5–8.1)

## 2017-01-01 LAB — OSMOLALITY, URINE: OSMOLALITY UR: 375 mosm/kg (ref 300–900)

## 2017-01-01 LAB — OSMOLALITY: OSMOLALITY: 305 mosm/kg — AB (ref 275–295)

## 2017-01-01 MED ORDER — FENTANYL CITRATE (PF) 100 MCG/2ML IJ SOLN
12.5000 ug | Freq: Once | INTRAMUSCULAR | Status: AC
  Administered 2017-01-01: 12.5 ug via INTRAVENOUS
  Filled 2017-01-01: qty 2

## 2017-01-01 MED ORDER — ALLOPURINOL 100 MG PO TABS
100.0000 mg | ORAL_TABLET | Freq: Every day | ORAL | Status: DC
  Administered 2017-01-01 – 2017-01-04 (×4): 100 mg via ORAL
  Filled 2017-01-01 (×4): qty 1

## 2017-01-01 MED ORDER — FENTANYL CITRATE (PF) 100 MCG/2ML IJ SOLN
25.0000 ug | INTRAMUSCULAR | Status: DC | PRN
  Administered 2017-01-02: 25 ug via INTRAVENOUS
  Filled 2017-01-01 (×2): qty 2

## 2017-01-01 MED ORDER — MILRINONE LACTATE IN DEXTROSE 20-5 MG/100ML-% IV SOLN
0.2500 ug/kg/min | INTRAVENOUS | Status: DC
  Filled 2017-01-01: qty 100

## 2017-01-01 MED ORDER — ATORVASTATIN CALCIUM 40 MG PO TABS
40.0000 mg | ORAL_TABLET | Freq: Every evening | ORAL | Status: DC
  Administered 2017-01-02: 40 mg via ORAL
  Filled 2017-01-01 (×2): qty 1

## 2017-01-01 MED ORDER — SODIUM CHLORIDE 0.9 % IV SOLN
INTRAVENOUS | Status: DC
  Administered 2017-01-01: 11:00:00 via INTRAVENOUS

## 2017-01-01 MED ORDER — WARFARIN - PHARMACIST DOSING INPATIENT: Freq: Every day | Status: DC

## 2017-01-01 MED ORDER — ENSURE ENLIVE PO LIQD
237.0000 mL | Freq: Three times a day (TID) | ORAL | Status: DC
  Administered 2017-01-01 – 2017-01-04 (×8): 237 mL via ORAL
  Filled 2017-01-01: qty 237

## 2017-01-01 MED ORDER — PROMETHAZINE HCL 25 MG/ML IJ SOLN: 12.5000 mg | Freq: Three times a day (TID) | INTRAMUSCULAR | Status: DC | PRN

## 2017-01-01 MED ORDER — FENTANYL CITRATE (PF) 100 MCG/2ML IJ SOLN
25.0000 ug | Freq: Once | INTRAMUSCULAR | Status: AC
  Administered 2017-01-01: 25 ug via INTRAVENOUS
  Filled 2017-01-01: qty 2

## 2017-01-01 MED ORDER — ACETAMINOPHEN 500 MG PO TABS: 1000.0000 mg | ORAL_TABLET | Freq: Four times a day (QID) | ORAL | Status: DC | PRN

## 2017-01-01 MED ORDER — OXYCODONE HCL 5 MG PO TABS
5.0000 mg | ORAL_TABLET | ORAL | Status: DC | PRN
  Administered 2017-01-01 – 2017-01-04 (×5): 5 mg via ORAL
  Filled 2017-01-01 (×6): qty 1

## 2017-01-01 MED ORDER — WARFARIN SODIUM 7.5 MG PO TABS
7.5000 mg | ORAL_TABLET | Freq: Once | ORAL | Status: DC
  Filled 2017-01-01: qty 1

## 2017-01-01 MED ORDER — AMIODARONE HCL 200 MG PO TABS
200.0000 mg | ORAL_TABLET | Freq: Every day | ORAL | Status: DC
  Administered 2017-01-01 – 2017-01-02 (×2): 200 mg via ORAL
  Filled 2017-01-01 (×2): qty 1

## 2017-01-01 NOTE — ED Triage Notes (Signed)
Patient states he went to the bathroom was getting up off the commode and fell landing on his left side. C/o left flank pain  Several skin tears to right and left arms. Patient has a PICC line with milrinone drip  Infusing, patient states he is also going to the wound center at [facility] for his right foot. Patient is A/O . Fall bracelet applied.

## 2017-01-01 NOTE — H&P (Signed)
History and Physical    Jason Choi PZW:[id number] DOB: [birth year] DOA: 01/01/2017  PCP: Heywood Bene, PA-C Patient coming from: home  Chief Complaint: fall  HPI: Jason Choi is a 76 y.o. male with medical history significant of severe pulmonary hypertension, MI/CAD, C KD, systolic congestive heart failure, atrophic fibrillation status post AICD placement, hypertension, hyperlipidemia.. Patient reporting after fall at home and his bathroom. Patient states he has been feeling more weak as of late but denies any lower extremity swelling, shortness of breath orthopnea, wheezing, fevers, nausea, vomiting. Patient does endorse several loose stools per day over the last several days since discharge but denies any hematochezia, melena or abdominal pain. Patient states that he went to the bathroom on the morning of admission and then 1 to ambulate out of the bathroom. Upon returning to grab his cane he lost his balance and fell to the ground striking his left arm and side patient also states that his head barely hit the door but did not lose consciousness denies headache or bleeding from his head. Patient states he normally uses a for walker to ambulate. Patient states he lives by himself and his medical alert button to call EMS to his home. Patient thinks she is on the ground for approximately 30 minutes before EMS services were able to take his door off the hinges and get him out. Currently complaining of left rib and neck stiffness and pain. Patient states that prior to the hospital he had a good workout but reports eating and drinking very little over the last several days.   ED Course: fentanyl given for pain. objectiv findings below   Review of Systems: As per HPI otherwise all other systems reviewed and are negative  Ambulatory Status: requires 4-walker at baseline.   Past Medical History:  Diagnosis Date  . AICD (automatic cardioverter/defibrillator) present   . Anemia    Referral to GI Feb 2013  . Arthritis    "hands primarily" (10/12/2016)  . Atrial fibrillation ([place])    ON AMIODARONE: PER VISIT NOTE IN [facility]  . CHF (congestive heart failure) ([place])   . Chronic anticoagulation    on coumadin  . Chronic kidney disease (CKD), stage II (mild)   . Gout   . High risk medication use    on amiodarone  . HTN (hypertension)   . Hypercholesteremia   . LV dysfunction    EF 35 to 40% per echo May 2012; EF remains 35 to 40% per echo Feb 2013. Referred for ICD  . Myocardial infarction ([place])   . Osteoarthritis of right shoulder region    ACUTE PAIN  . 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 Pulmonary hypertension, moderate to severe [facility])     Past Surgical History:  Procedure Laterality Date  . CARDIAC CATHETERIZATION  2009   Grafts patent  . CARDIAC CATHETERIZATION  2012   Grafts patent. EF was 25 to 30% but 35 to 40 by echo  . CARDIAC CATHETERIZATION  2003  . CARDIAC DEFIBRILLATOR PLACEMENT  10/12/2016   MDT single chamber device  . CARDIOVERSION  2008--  FOR PAF   SUCCESSFUL  . CARDIOVERSION N/A 06/13/2013   Procedure: CARDIOVERSION;  Surgeon: Peter M Martinique, MD;  Location: [facility] ENDOSCOPY;  Service: Cardiovascular;  Laterality: N/A;  . CATARACT EXTRACTION, BILATERAL Bilateral   . CORONARY ARTERY BYPASS GRAFT  2002   SVG to PDA, Free radial to Intermediate, LIMA to LAD by Dr Prescott Gum  .  ICD IMPLANT N/A 10/12/2016   Procedure: ICD Implant;  Surgeon: Constance Haw, MD;  Location: [place] CV LAB;  Service: Cardiovascular;  Laterality: N/A;  . KNEE ARTHROSCOPY Left 2000  . RIGHT HEART CATH N/A 08/08/2016   Procedure: Right Heart Cath;  Surgeon: Larey Dresser, MD;  Location: [place] CV LAB;  Service: Cardiovascular;  Laterality: N/A;  . Right Leg surgeries Right X 5-6   due to polio; "relocated muscles; pins in ankle"  . SHOULDER ARTHROSCOPY W/ ROTATOR CUFF REPAIR Right 05/16/2011    Social History   Social History  .  Marital status: Widowed    Spouse name: N/A  . Number of children: 1  . Years of education: N/A   Occupational History  . 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  [employer]   Social History Main Topics  . Smoking status: Former Smoker    Packs/day: 2.00    Years: 20.00    Types: Cigarettes  . Smokeless tobacco: Never Used     Comment: 10/12/2016 "haven't had a cigarette since 1980"  . Alcohol use 7.2 oz/week    4 Shots of liquor, 8 Cans of beer per week     Comment: 10/12/2016 "couple beers and a shot of [place] royal most  3-4 times/week"  . Drug use: No  . Sexual activity: No   Other Topics Concern  . Not on file   Social History Narrative   Pt lives in [place] with spouse (who is a Systems analyst).  1 grown healthy daughter who is a Insurance claims handler.       Retired 2003 from [employer].  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    Allergies  Allergen Reactions  . Lidocaine Other (See Comments)    Passed out - at the dentist's office  . Procaine Hcl Other (See Comments)    Passed out - at the dentist's office    Family History  Problem Relation Age of Onset  . Valvular heart disease Father   . Colon cancer Neg Hx   . Colon polyps Neg Hx   . Rectal cancer Neg Hx   . Stomach cancer Neg Hx       Prior to Admission medications   Medication Sig Start Date End Date Taking? Authorizing Provider  acetaminophen (TYLENOL) 325 MG tablet Take 2 tablets (650 mg total) by mouth every 4 (four) hours as needed for headache or mild pain. 08/10/16   Shirley Friar, PA-C  allopurinol (ZYLOPRIM) 100 MG tablet Take 1 tablet (100 mg total) by mouth daily. 09/21/16   Larey Dresser, MD  amiodarone (PACERONE) 200 MG tablet Take 400 mg BID for 7 days (to end 01/02/17), then 200 mg BID for 7 days, then 200 mg daily. 12/27/16   Arbutus Leas, NP  atorvastatin (LIPITOR) 40 MG tablet Take 1 tablet (40 mg total) by mouth every evening.  12/27/16   Arbutus Leas, NP  Multiple Vitamin (MULTI-VITAMIN DAILY) TABS Take 1 tablet by mouth daily.    [provider]  nitroGLYCERIN (NITROSTAT) 0.4 MG SL tablet Place 1 tablet (0.4 mg total) under the tongue every 5 (five) minutes x 3 doses as needed for chest pain. 12/06/16   Arbutus Leas, NP  potassium chloride SA (K-DUR,KLOR-CON) 20 MEQ tablet Take 2 tablets (40 mEq total) by mouth daily. 12/28/16   Arbutus Leas, NP  torsemide (DEMADEX) 20 MG tablet  Take 40 mg in the morning and 20 mg in the evening. 12/27/16   Arbutus Leas, NP  warfarin (COUMADIN) 5 MG tablet Take 5 mg daily, except for Tues and Thurs. Take 2.5 mg daily. 12/27/16   Arbutus Leas, NP    Physical Exam: Vitals:   01/01/17 0730 01/01/17 0800 01/01/17 0900 01/01/17 0930  BP: (!) 92/52 102/78 97/81 98/77   Pulse: (!) 115 75  82  Resp: 15 18 (!) 21 (!) 25  Temp:      TempSrc:      SpO2:  98% 100% 92%  Weight:      Height:         General:  Appears calm and comfortable Eyes:  PERRL, EOMI, normal lids, iris ENT:  grossly normal hearing, lips & tongue, mmm Neck:  no LAD, masses or thyromegaly Cardiovascular:  RRR, II/VI systolic murmur. No LE edema.  Respiratory:  CTA bilaterally, no w/r/r. Normal respiratory effort. Abdomen:  soft, ntnd, NABS Skin: multiple ecchymosis and skin tears w/ bandaging in place.  Musculoskeletal: Global weakness but with good tone. No bony abnormalities. Psychiatric:  grossly normal mood and affect, speech fluent and appropriate, AOx3 Neurologic:  CN 2-12 grossly intact, moves all extremities in coordinated fashion, sensation intact  Labs on Admission: I have personally reviewed following labs and imaging studies  CBC:  Recent Labs Lab 12/26/16 0248 01/01/17 0529  WBC 7.0 8.8  NEUTROABS 4.1 7.0  HGB 8.5* 8.9*  HCT 27.4* 28.8*  MCV 90.4 88.3  PLT 217 518   Basic Metabolic Panel:  Recent Labs Lab 12/26/16 0248 12/27/16 0236 01/01/17 0529  NA 133* 130* 129*  K 3.6  4.5 6.5*  CL 89* 87* 91*  CO2 35* 33* 25  GLUCOSE 95 113* 106*  BUN 66* 73* 95*  CREATININE 2.08* 2.22* 3.79*  CALCIUM 9.0 9.1 9.3   GFR: Estimated Creatinine Clearance: 17.6 mL/min (A) (by C-G formula based on SCr of 3.79 mg/dL (H)). Liver Function Tests:  Recent Labs Lab 01/01/17 0529  AST 48*  ALT 26  ALKPHOS 87  BILITOT 1.5*  PROT 6.2*  ALBUMIN 3.3*   No results for input(s): LIPASE, AMYLASE in the last 168 hours. No results for input(s): AMMONIA in the last 168 hours. Coagulation Profile:  Recent Labs Lab 12/26/16 0248 12/27/16 0236 01/01/17 0529  INR 2.42 2.34 1.70   Cardiac Enzymes: No results for input(s): CKTOTAL, CKMB, CKMBINDEX, TROPONINI in the last 168 hours. BNP (last 3 results) No results for input(s): PROBNP in the last 8760 hours. HbA1C: No results for input(s): HGBA1C in the last 72 hours. CBG: No results for input(s): GLUCAP in the last 168 hours. Lipid Profile: No results for input(s): CHOL, HDL, LDLCALC, TRIG, CHOLHDL, LDLDIRECT in the last 72 hours. Thyroid Function Tests: No results for input(s): TSH, T4TOTAL, FREET4, T3FREE, THYROIDAB in the last 72 hours. Anemia Panel: No results for input(s): VITAMINB12, FOLATE, FERRITIN, TIBC, IRON, RETICCTPCT in the last 72 hours. Urine analysis:    Component Value Date/Time   COLORURINE AMBER (A) 01/01/2017 0905   APPEARANCEUR CLEAR 01/01/2017 0905   LABSPEC 1.014 01/01/2017 0905   PHURINE 5.0 01/01/2017 0905   GLUCOSEU NEGATIVE 01/01/2017 0905   HGBUR NEGATIVE 01/01/2017 0905   BILIRUBINUR NEGATIVE 01/01/2017 0905   KETONESUR NEGATIVE 01/01/2017 0905   PROTEINUR NEGATIVE 01/01/2017 0905   NITRITE NEGATIVE 01/01/2017 0905   LEUKOCYTESUR NEGATIVE 01/01/2017 0905    Creatinine Clearance: Estimated Creatinine Clearance: 17.6 mL/min (A) (by C-G formula based  on SCr of 3.79 mg/dL (H)).  Sepsis Labs: @LABRCNTIP (procalcitonin:4,lacticidven:4) )No results found for this or any previous visit  (from the past 240 hour(s)).   Radiological Exams on Admission: Ct Abdomen Pelvis Wo Contrast  Result Date: 01/01/2017 CLINICAL DATA:  Status post blunt trauma to the abdomen. Concern for chest or abdominal injury. Initial encounter. EXAM: CT CHEST, ABDOMEN AND PELVIS WITHOUT CONTRAST TECHNIQUE: Multidetector CT imaging of the chest, abdomen and pelvis was performed following the standard protocol without IV contrast. COMPARISON:  Abdominal radiograph performed 11/28/2016 FINDINGS: CT CHEST FINDINGS Cardiovascular: The heart is mildly enlarged. Diffuse coronary artery calcifications are seen. The patient is status post median sternotomy. Scattered calcification is seen along the thoracic aorta. A right PICC is noted ending about the distal SVC. A pacemaker is noted at the left chest wall, with a single lead ending at the right ventricle. The great vessels demonstrate scattered calcification but are otherwise grossly unremarkable. Mediastinum/Nodes: Scattered enlarged right paratracheal nodes measure up to 1.4 cm in short axis. No pericardial effusion is identified. The thyroid gland is grossly unremarkable. No axillary lymphadenopathy is seen. Lungs/Pleura: A small right pleural effusion is noted. Diffuse interstitial prominence is somewhat chronic in appearance, extending to the periphery, raising concern for interstitial lung disease and pulmonary fibrosis. Would correlate for any associated symptoms. No pneumothorax is identified. No dominant mass is seen. Musculoskeletal: No acute osseous abnormalities are identified. The visualized musculature is unremarkable in appearance. CT ABDOMEN PELVIS FINDINGS Hepatobiliary: The liver is unremarkable in appearance. The gallbladder is unremarkable in appearance. The common bile duct remains normal in caliber. Pancreas: The pancreas is within normal limits. Spleen: Trace fluid is noted about the spleen. The spleen is otherwise unremarkable. Adrenals/Urinary Tract: The  adrenal glands are unremarkable in appearance. The kidneys are within normal limits. There is no evidence of hydronephrosis. No renal or ureteral stones are identified. Nonspecific perinephric stranding is noted bilaterally. Stomach/Bowel: The stomach is unremarkable in appearance. The small bowel is within normal limits. The appendix is normal in caliber, without evidence of appendicitis. The colon is unremarkable in appearance. Trace fluid is seen tracking along the paracolic gutters bilaterally. Vascular/Lymphatic: Diffuse calcification is seen along the abdominal aorta and its branches. The abdominal aorta is otherwise grossly unremarkable. The inferior vena cava is grossly unremarkable. No retroperitoneal lymphadenopathy is seen. No pelvic sidewall lymphadenopathy is identified. Reproductive: Apparent bladder wall thickening may reflect mild chronic inflammation or possibly cystitis. The prostate remains normal in size. Other: No additional soft tissue abnormalities are seen. Musculoskeletal: No acute osseous abnormalities are identified. The visualized musculature is unremarkable in appearance. IMPRESSION: 1. No evidence of traumatic injury to the chest, abdomen or pelvis. 2. Small right pleural effusion. Diffuse interstitial prominence is somewhat chronic in appearance, extending to the periphery, raising concern for interstitial lung disease and pulmonary fibrosis. Would correlate for any associated symptoms. 3. Apparent bladder wall thickening may reflect mild chronic inflammation or possibly cystitis. 4. Trace fluid noted along the paracolic gutters bilaterally. 5. Mild cardiomegaly.  Diffuse coronary artery calcifications seen. 6. Enlarged right paratracheal nodes measure up to 1.4 cm in short axis. 7. Diffuse aortic atherosclerosis noted. Electronically Signed   By: Garald Balding M.D.   On: 01/01/2017 06:27   Ct Chest Wo Contrast  Result Date: 01/01/2017 CLINICAL DATA:  Status post blunt trauma to  the abdomen. Concern for chest or abdominal injury. Initial encounter. EXAM: CT CHEST, ABDOMEN AND PELVIS WITHOUT CONTRAST TECHNIQUE: Multidetector CT imaging of the chest, abdomen  and pelvis was performed following the standard protocol without IV contrast. COMPARISON:  Abdominal radiograph performed 11/28/2016 FINDINGS: CT CHEST FINDINGS Cardiovascular: The heart is mildly enlarged. Diffuse coronary artery calcifications are seen. The patient is status post median sternotomy. Scattered calcification is seen along the thoracic aorta. A right PICC is noted ending about the distal SVC. A pacemaker is noted at the left chest wall, with a single lead ending at the right ventricle. The great vessels demonstrate scattered calcification but are otherwise grossly unremarkable. Mediastinum/Nodes: Scattered enlarged right paratracheal nodes measure up to 1.4 cm in short axis. No pericardial effusion is identified. The thyroid gland is grossly unremarkable. No axillary lymphadenopathy is seen. Lungs/Pleura: A small right pleural effusion is noted. Diffuse interstitial prominence is somewhat chronic in appearance, extending to the periphery, raising concern for interstitial lung disease and pulmonary fibrosis. Would correlate for any associated symptoms. No pneumothorax is identified. No dominant mass is seen. Musculoskeletal: No acute osseous abnormalities are identified. The visualized musculature is unremarkable in appearance. CT ABDOMEN PELVIS FINDINGS Hepatobiliary: The liver is unremarkable in appearance. The gallbladder is unremarkable in appearance. The common bile duct remains normal in caliber. Pancreas: The pancreas is within normal limits. Spleen: Trace fluid is noted about the spleen. The spleen is otherwise unremarkable. Adrenals/Urinary Tract: The adrenal glands are unremarkable in appearance. The kidneys are within normal limits. There is no evidence of hydronephrosis. No renal or ureteral stones are  identified. Nonspecific perinephric stranding is noted bilaterally. Stomach/Bowel: The stomach is unremarkable in appearance. The small bowel is within normal limits. The appendix is normal in caliber, without evidence of appendicitis. The colon is unremarkable in appearance. Trace fluid is seen tracking along the paracolic gutters bilaterally. Vascular/Lymphatic: Diffuse calcification is seen along the abdominal aorta and its branches. The abdominal aorta is otherwise grossly unremarkable. The inferior vena cava is grossly unremarkable. No retroperitoneal lymphadenopathy is seen. No pelvic sidewall lymphadenopathy is identified. Reproductive: Apparent bladder wall thickening may reflect mild chronic inflammation or possibly cystitis. The prostate remains normal in size. Other: No additional soft tissue abnormalities are seen. Musculoskeletal: No acute osseous abnormalities are identified. The visualized musculature is unremarkable in appearance. IMPRESSION: 1. No evidence of traumatic injury to the chest, abdomen or pelvis. 2. Small right pleural effusion. Diffuse interstitial prominence is somewhat chronic in appearance, extending to the periphery, raising concern for interstitial lung disease and pulmonary fibrosis. Would correlate for any associated symptoms. 3. Apparent bladder wall thickening may reflect mild chronic inflammation or possibly cystitis. 4. Trace fluid noted along the paracolic gutters bilaterally. 5. Mild cardiomegaly.  Diffuse coronary artery calcifications seen. 6. Enlarged right paratracheal nodes measure up to 1.4 cm in short axis. 7. Diffuse aortic atherosclerosis noted. Electronically Signed   By: Garald Balding M.D.   On: 01/01/2017 06:27   Assessment/Plan Active Problems:   HTN (hypertension)   Hypercholesteremia   Chronic systolic CHF (congestive heart failure) ([facility])   Hyponatremia   Gout   AKI (acute kidney injury) ([place])   Hyperkalemia   Anemia due to chronic kidney  disease   AoCKD: Cr. 3.8. Baseline 2.1. Suspect from poor oral intake.  - IVF - BMET in am  Afib/CHF: compensated. AICD in place. Pt recently w/ admission for CHF. On milranone. I discussed the case w/ cardiology/CHF teams and they will see the pt in consultation.  - continue amio, lipitor - Continue coumadin - Hold torsemide given hypotension and dehydration -Further mgt per cardiology - milrinone   Hyperkalemia: likely  from AKI and taking [place] home K - gentle IVF  - BMP Q12 x2 - Hold off on more aggressive measures unless needed  Fall: mechanical fall. Pt w/ what sounds like progressive physical deconditioning. May need placement as opposed to living by self. Sustained skin tears on arms and L rib pain. Pt thinks he was down for 30 minutes. - PT/OT - PO and IV pain medicaitons.  - CK  Hyponatremia: 129. Near recent baseline. Poor oral intake per family and on diuretics - Osomolality studies - Gentle NS IVF  Gout: - continue allopurinol  Social: pt appears to be approaching end of life or at the very least needing a higher level of care at time of discharge.  - Palliative care consult for GOC.  Anemia: Hgb 8.9. At baseline - CBC in am   DVT prophylaxis: coumadin  Code Status: full  Family Communication: son  Disposition Plan: pending improvement or GOC decisions  Consults called: Cards/CHF  Admission status: observation    MERRELL, DAVID J MD [facility]  If 7PM-7AM, please contact night-coverage [email or web address] Password [email or web address]  01/01/2017, 10:19 AM

## 2017-01-01 NOTE — ED Notes (Signed)
Dinner tray ordered.

## 2017-01-01 NOTE — Progress Notes (Signed)
ANTICOAGULATION CONSULT NOTE - Initial Consult  Pharmacy Consult for warfarin Indication: atrial fibrillation  Allergies  Allergen Reactions  . Lidocaine Other (See Comments)    Passed out - at the dentist's office  . Procaine Hcl Other (See Comments)    Passed out - at the dentist's office   Patient Measurements: Height: 6\' 2"  (188 cm) Weight: 160 lb (72.6 kg) IBW/kg (Calculated) : 82.2  Vital Signs: Temp: 97.6 F (36.4 C) (08/06 0445) Temp Source: Oral (08/06 0445) BP: 98/77 (08/06 0930) Pulse Rate: 82 (08/06 0930)  Labs:  Recent Labs  01/01/17 0529  HGB 8.9*  HCT 28.8*  PLT 326  LABPROT 20.1*  INR 1.70  CREATININE 3.79*    Estimated Creatinine Clearance: 17.6 mL/min (A) (by C-G formula based on SCr of 3.79 mg/dL (H)).   Medical History: Past Medical History:  Diagnosis Date  . AICD (automatic cardioverter/defibrillator) present   . Anemia    Referral to GI Feb 2013  . Arthritis    "hands primarily" (10/12/2016)  . Atrial fibrillation ([place])    ON AMIODARONE: PER VISIT NOTE IN [facility]  . CHF (congestive heart failure) ([place])   . Chronic anticoagulation    on coumadin  . Chronic kidney disease (CKD), stage II (mild)   . Gout   . High risk medication use    on amiodarone  . HTN (hypertension)   . Hypercholesteremia   . LV dysfunction    EF 35 to 40% per echo May 2012; EF remains 35 to 40% per echo Feb 2013. Referred for ICD  . Myocardial infarction ([place])   . Osteoarthritis of right shoulder region    ACUTE PAIN  . 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 Pulmonary hypertension, moderate to severe [facility])    Assessment: Jason Choi is a 76 y.o. male with significant cardiac medical history on warfarin PTA for atrial fibrillation. Last dose PTA on 8/5, INR subtherapeutic today 1.7. Patient and family report dose is alternating warfarin 5mg  daily except 2.5mg  on Tuesday and Thursday. The most recent clinic note states patient should be  warfarin 6mg  daily except 3mg  on Wednesday. Provider does not wish to bridge with lovenox or heparin at this time. Baseline HgB low 8.9  Goal of Therapy:  INR 2-3 Monitor platelets by anticoagulation protocol: Yes   Plan:  Warfarin 7.5mg  x1 daily Daily INC/CBC Monitor for s/sx of bleeding  Jason Choi 01/01/2017,10:46 AM

## 2017-01-01 NOTE — Care Management Note (Signed)
Case Management Note  Patient Details  Name: MASASHI SNOWDON MRN: [id number] Date of Birth: 1940-11-07  Subjective/Objective:                 From home alone. 76 y.o. 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 He reports he landed on his left arm causing pain to LUQ abdomen/lower left chest pain. No SOB. He denies hitting his head. He reports he had gotten up to the bathroom and when he stood from the toilet he became weak and unable to stand.   Action/Plan: Admit status OBSERVATION; anticipate discharge [place].   Expected Discharge Date:   (unknown)               Expected Discharge Plan:     In-House Referral:     Discharge planning Services     Post Acute Care Choice:    Choice offered to:     DME Arranged:    DME Agency:   [facility]  HH Arranged:    [place] Agency:     Status of Service:     If discussed at [employer] [facility] [employer], dates discussed:    Additional Comments: Pt currently active with [facility] for RN, NA services.  Resumption of care requested. Burna Forts, RN of [facility]  notified.  No DME needs identified at this time.  Fuller Mandril, RN 01/01/2017, 8:37 AM

## 2017-01-01 NOTE — ED Provider Notes (Signed)
[facility] DEPT Provider Note   CSN: [id number] Arrival date & time: 01/01/17  0435     History   Chief Complaint Chief Complaint  Patient presents with  . Fall    HPI Jason Choi is a 76 y.o. male.  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 He reports he landed on his left arm causing pain to LUQ abdomen/lower left chest pain. No SOB. He denies hitting his head. He reports he had gotten up to the bathroom and when he stood from the toilet he became weak and unable to stand. No dizziness, chest pain. There was no syncope.    The history is provided by the patient. No language interpreter was used.  Fall  Associated symptoms include abdominal pain (LUQ ). Pertinent negatives include no chest pain, no headaches and no shortness of breath.    Past Medical History:  Diagnosis Date  . AICD (automatic cardioverter/defibrillator) present   . Anemia    Referral to GI Feb 2013  . Arthritis    "hands primarily" (10/12/2016)  . Atrial fibrillation ([place])    ON AMIODARONE: PER VISIT NOTE IN [facility]  . CHF (congestive heart failure) ([place])   . Chronic anticoagulation    on coumadin  . Chronic kidney disease (CKD), stage II (mild)   . Gout   . High risk medication use    on amiodarone  . HTN (hypertension)   . Hypercholesteremia   . LV dysfunction    EF 35 to 40% per echo May 2012; EF remains 35 to 40% per echo Feb 2013. Referred for ICD  . Myocardial infarction ([place])   . Osteoarthritis of right shoulder region    ACUTE PAIN  . 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 Pulmonary hypertension, moderate to severe [facility])     Patient Active Problem List   Diagnosis Date Noted  . Palliative care by specialist   . DNR (do not resuscitate) discussion   . Acute  on chronic systolic (congestive) heart failure ([place]) 12/21/2016  . Symptomatic bradycardia 12/04/2016  . Acute on chronic systolic and diastolic heart failure, NYHA class 3 ([place]) 12/04/2016  . CHF (congestive heart failure) ([place]) 10/12/2016  . Gout 08/16/2016  . Syncope 07/26/2016  . Encounter for therapeutic drug monitoring 07/08/2013  . Hyponatremia 01/03/2012  . CKD (chronic kidney disease) stage 3, GFR 30-59 ml/min   . Anemia   . CAD (coronary artery disease)   . HTN (hypertension)   . Hypercholesteremia   . Chronic anticoagulation   . Chronic systolic CHF (congestive heart failure) ([place])   . Permanent atrial fibrillation ([place]) 08/22/2010    Past Surgical History:  Procedure Laterality Date  . CARDIAC CATHETERIZATION  2009   [place]s patent  . CARDIAC CATHETERIZATION  2012   Grafts patent. EF was 25 to 30% but 35 to 40 by echo  . CARDIAC CATHETERIZATION  2003  . CARDIAC DEFIBRILLATOR PLACEMENT  10/12/2016   MDT single chamber device  . CARDIOVERSION  2008--  FOR PAF   SUCCESSFUL  . CARDIOVERSION N/A 06/13/2013   Procedure: CARDIOVERSION;  Surgeon: Peter M Martinique, MD;  Location: [facility] ENDOSCOPY;  Service: Cardiovascular;  Laterality: N/A;  . CATARACT EXTRACTION, BILATERAL Bilateral   . CORONARY ARTERY BYPASS GRAFT  2002   SVG to  PDA, Free radial to Intermediate, LIMA to LAD by Dr Prescott Gum  . ICD IMPLANT N/A 10/12/2016   Procedure: ICD Implant;  Surgeon: Constance Haw, MD;  Location: [facility] CV LAB;  Service: Cardiovascular;  Laterality: N/A;  . KNEE ARTHROSCOPY Left 2000  . RIGHT HEART CATH N/A 08/08/2016   Procedure: Right Heart Cath;  Surgeon: Larey Dresser, MD;  Location: [facility] CV LAB;  Service: Cardiovascular;  Laterality: N/A;  . Right Leg surgeries Right X 5-6   due to polio; "relocated muscles; pins in ankle"  . SHOULDER ARTHROSCOPY W/ ROTATOR CUFF REPAIR Right 05/16/2011       Home Medications    Prior to Admission medications   Medication Sig  Start Date End Date Taking? Authorizing Provider  acetaminophen (TYLENOL) 325 MG tablet Take 2 tablets (650 mg total) by mouth every 4 (four) hours as needed for headache or mild pain. 08/10/16   Shirley Friar, PA-C  allopurinol (ZYLOPRIM) 100 MG tablet Take 1 tablet (100 mg total) by mouth daily. 09/21/16   Larey Dresser, MD  amiodarone (PACERONE) 200 MG tablet Take 400 mg BID for 7 days (to end 01/02/17), then 200 mg BID for 7 days, then 200 mg daily. 12/27/16   Arbutus Leas, NP  atorvastatin (LIPITOR) 40 MG tablet Take 1 tablet (40 mg total) by mouth every evening. 12/27/16   Arbutus Leas, NP  Multiple Vitamin (MULTI-VITAMIN DAILY) TABS Take 1 tablet by mouth daily.    [provider]  nitroGLYCERIN (NITROSTAT) 0.4 MG SL tablet Place 1 tablet (0.4 mg total) under the tongue every 5 (five) minutes x 3 doses as needed for chest pain. 12/06/16   Arbutus Leas, NP  potassium chloride SA (K-DUR,KLOR-CON) 20 MEQ tablet Take 2 tablets (40 mEq total) by mouth daily. 12/28/16   Arbutus Leas, NP  torsemide (DEMADEX) 20 MG tablet Take 40 mg in the morning and 20 mg in the evening. 12/27/16   Arbutus Leas, NP  warfarin (COUMADIN) 5 MG tablet Take 5 mg daily, except for Tues and Thurs. Take 2.5 mg daily. 12/27/16   Arbutus Leas, NP    Family History Family History  Problem Relation Age of Onset  . Valvular heart disease Father   . Colon cancer Neg Hx   . Colon polyps Neg Hx   . Rectal cancer Neg Hx   . Stomach cancer Neg Hx     Social History Social History  Substance Use Topics  . Smoking status: Former Smoker    Packs/day: 2.00    Years: 20.00    Types: Cigarettes  . Smokeless tobacco: Never Used     Comment: 10/12/2016 "haven't had a cigarette since 1980"  . Alcohol use 7.2 oz/week    4 Shots of liquor, 8 Cans of beer per week     Comment: 10/12/2016 "couple beers and a shot of [place] most  3-4 times/week"     Allergies   Lidocaine and Procaine hcl   Review of  Systems Review of Systems  Constitutional: Negative for chills and fever.  Respiratory: Negative.  Negative for shortness of breath.   Cardiovascular: Negative.  Negative for chest pain.  Gastrointestinal: Positive for abdominal pain (LUQ ). Negative for nausea and vomiting.  Musculoskeletal: Negative.  Negative for back pain.  Skin: Negative.  Negative for wound.  Neurological: Positive for weakness. Negative for syncope and headaches.     Physical Exam Updated Vital Signs BP  107/67 (BP Location: Left Arm)   Pulse 89   Temp 97.6 F (36.4 C) (Oral)   Resp 16   Ht 6\' 2"  (1.88 m)   Wt 72.6 kg (160 lb)   SpO2 100%   BMI 20.54 kg/m   Physical Exam  Constitutional: He is oriented to person, place, and time. He appears well-developed and well-nourished.  HENT:  Head: Normocephalic and atraumatic.  Neck: Normal range of motion. Neck supple.  Cardiovascular: Normal rate and regular rhythm.   Pulmonary/Chest: Effort normal. He has no wheezes. He has rales. He exhibits no tenderness.  Abdominal: Soft. Bowel sounds are normal. There is tenderness. There is no rebound and no guarding.    Musculoskeletal: Normal range of motion.  Milrinone pump in right upper arm.   Neurological: He is alert and oriented to person, place, and time.  Skin: Skin is warm and dry.  Psychiatric: He has a normal mood and affect.     ED Treatments / Results  Labs (all labs ordered are listed, but only abnormal results are displayed) Labs Reviewed  PROTIME-INR - Abnormal; Notable for the following:       Result Value   Prothrombin Time 20.1 (*)    All other components within normal limits  CBC WITH DIFFERENTIAL/PLATELET - Abnormal; Notable for the following:    RBC 3.26 (*)    Hemoglobin 8.9 (*)    HCT 28.8 (*)    RDW 19.0 (*)    All other components within normal limits  COMPREHENSIVE METABOLIC PANEL - Abnormal; Notable for the following:    Sodium 129 (*)    Potassium 6.5 (*)    Chloride 91  (*)    Glucose, Bld 106 (*)    BUN 95 (*)    Creatinine, Ser 3.79 (*)    Total Protein 6.2 (*)    Albumin 3.3 (*)    AST 48 (*)    Total Bilirubin 1.5 (*)    GFR calc non Af Amer 14 (*)    GFR calc Af Amer 17 (*)    All other components within normal limits    EKG  EKG Interpretation None       Radiology No results found.  Procedures Procedures (including critical care time)  Medications Ordered in ED Medications  fentaNYL (SUBLIMAZE) injection 12.5 mcg (12.5 mcg Intravenous Given 01/01/17 0555)     Initial Impression / Assessment and Plan / ED Course  I have reviewed the triage vital signs and the nursing notes.  Pertinent labs & imaging results that were available during my care of the patient were reviewed by me and considered in my medical decision making (see chart for details).     Patient presents after fall from generalized weakness after standing after using the toilet. No syncope. He fell onto his left arm with resulting LUQ abdominal pain. No rib tenderness.   His pain has been controlled with Fentanyl. He has a history of ischemic cardiomyopathy, CHF, on Milrinone, s/p CABG. He reports his breathing is no worse tonight. No vomiting.   Labs are concerning for worsening renal failure. Last Cr 4 days ago was 2.22 while today it has risen to 3.79. Given his weakness and AKI will admit to medicine. Cardiology was apprised of admission given recent hospital stay for CHF and complicated cardiac history. They will see him inpatient and follow.     Final Clinical Impressions(s) / ED Diagnoses   Final diagnoses:  None   1. Fall 2. Generalized  weakness 3. AKI  New Prescriptions New Prescriptions   No medications on file     Charlann Lange, Hershal Coria 01/01/17 1638    Orpah Greek, MD 01/02/17 [phone]

## 2017-01-01 NOTE — ED Notes (Signed)
Patient transported to CT 

## 2017-01-01 NOTE — ED Notes (Signed)
Dr. Marily Memos contacted this RN regarding page about patient request for Ensure. MD gave this RN verbal order for Ensure TID.

## 2017-01-01 NOTE — Consult Note (Signed)
Advanced Heart Failure Team Consult Note    Primary Physician: Heywood Bene, PA-C Primary Cardiologist:  Dr. [name]   Reason for Consultation: Fall, CHF   HPI:    Jason Choi is seen today for evaluation of fall, acute on chronic CHF at the request of Dr. Marily Memos.   Jason Choi is a 76 year old male with a past medical history of chronic systolic CHF due to ischemic cardiomyopathy (last echo July 2018 with EF 15%), status post Medtronic ICD, permanent atrial fibrillation.  Pt previously admitted 12/04/2016 with fatigue and weakness. Due to hypotension, dig, bb and spiro stopped. Pacing rate increased. Discharged early from [facility] as he demanded to go home. Discharge weight was 163 lbs.   Seen in HF clinic 12/13/16. Weight stable from discharge at that visit at 163 lbs. Concerns rased by [facility] as weight had decreased from 185 ->159 over 1 month period. Torsemide cut back with weight loss and poor nutrition. Concerned for recurrent low output HF. RHC planned pending labs.   Had recent admission from 12/21/2016 to 12/28/16 at which time home milrinone was initiated. He was sent home with home health. Discharge weight was 162 pounds.  He presented to the ED on 01/01/2017 after a fall at home. He was standing up from the toilet and became too weak to stand and subsequently fell on his left side. No syncope. He tells me that he remembers falling he just simply became too weak. Pertinent admission labs included creatinine 3.79, potassium 6.5. Chest CT with small right pleural effusion, no pneumothorax was identified. Of note he tells me that he's had about 4 loose stools over the past 24 hours. He has not been eating much at all at home. He will drink a boost, but other than that he will not eat much at all. Has been taking all of his medications. Denies shortness of breath, chest pain, orthopnea.     Review of Systems: [y] = yes, [ ]  = no   General: Weight gain [ ] ; Weight loss  [ y]; Anorexia [ ] ; Fatigue Blue.Reese ]; Fever [ ] ; Chills [ ] ; Weakness Blue.Reese ]  Cardiac: Chest pain/pressure [ ] ; Resting SOB [ ] ; Exertional SOB [ ] ; Orthopnea [ ] ; Pedal Edema [ ] ; Palpitations [ ] ; Syncope [ ] ; Presyncope [ ] ; Paroxysmal nocturnal dyspnea[ ]   Pulmonary: Cough [ ] ; Wheezing[ ] ; Hemoptysis[ ] ; Sputum [ ] ; Snoring [ ]   GI: Vomiting[ ] ; Dysphagia[ ] ; Melena[ ] ; Hematochezia [ ] ; Heartburn[ ] ; Abdominal pain [ ] ; Constipation [ ] ; Diarrhea [ ] ; BRBPR [ ]   GU: Hematuria[ ] ; Dysuria [ ] ; Nocturia[ ]   Vascular: Pain in legs with walking [ ] ; Pain in feet with lying flat [ ] ; Non-healing sores [ ] ; Stroke [ ] ; TIA [ ] ; Slurred speech [ ] ;  Neuro: Headaches[ ] ; Vertigo[ ] ; Seizures[ ] ; Paresthesias[ ] ;Blurred vision [ ] ; Diplopia [ ] ; Vision changes [ ]   Ortho/Skin: Arthritis [ ] ; Joint pain [ ] ; Muscle pain [ ] ; Joint swelling [ ] ; Back Pain [ ] ; Rash [ ]   Psych: Depression[ ] ; Anxiety[ ]   Heme: Bleeding problems [ ] ; Clotting disorders [ ] ; Anemia [ ]   Endocrine: Diabetes [ ] ; Thyroid dysfunction[ ]   Home Medications Prior to Admission medications   Medication Sig Start Date End Date Taking? Authorizing Provider  acetaminophen (TYLENOL) 325 MG tablet Take 2 tablets (650 mg total) by mouth every 4 (four) hours as needed for headache or mild  pain. 08/10/16  Yes Shirley Friar, PA-C  allopurinol (ZYLOPRIM) 100 MG tablet Take 1 tablet (100 mg total) by mouth daily. 09/21/16  Yes Larey Dresser, MD  amiodarone (PACERONE) 200 MG tablet Take 400 mg BID for 7 days (to end 01/02/17), then 200 mg BID for 7 days, then 200 mg daily. Patient taking differently: Take 200 mg by mouth daily. Take 400 mg BID for 7 days (to end 01/02/17), then 200 mg BID for 7 days, then 200 mg daily. 12/27/16  Yes Arbutus Leas, NP  atorvastatin (LIPITOR) 40 MG tablet Take 1 tablet (40 mg total) by mouth every evening. 12/27/16  Yes Arbutus Leas, NP  Multiple Vitamin (MULTI-VITAMIN DAILY) TABS Take 1 tablet by mouth daily.    Yes [provider]  potassium chloride SA (K-DUR,KLOR-CON) 20 MEQ tablet Take 2 tablets (40 mEq total) by mouth daily. 12/28/16  Yes Arbutus Leas, NP  torsemide (DEMADEX) 20 MG tablet Take 40 mg in the morning and 20 mg in the evening. 12/27/16  Yes Arbutus Leas, NP  warfarin (COUMADIN) 5 MG tablet Take 5 mg daily, except for Tues and Thurs. Take 2.5 mg daily. 12/27/16  Yes Arbutus Leas, NP  nitroGLYCERIN (NITROSTAT) 0.4 MG SL tablet Place 1 tablet (0.4 mg total) under the tongue every 5 (five) minutes x 3 doses as needed for chest pain. 12/06/16   Arbutus Leas, NP    Past Medical History: Past Medical History:  Diagnosis Date  . AICD (automatic cardioverter/defibrillator) present   . Anemia    Referral to GI Feb 2013  . Arthritis    "hands primarily" (10/12/2016)  . Atrial fibrillation ([place])    ON AMIODARONE: PER VISIT NOTE IN [facility]  . CHF (congestive heart failure) ([place])   . Chronic anticoagulation    on coumadin  . Chronic kidney disease (CKD), stage II (mild)   . Gout   . High risk medication use    on amiodarone  . HTN (hypertension)   . Hypercholesteremia   . LV dysfunction    EF 35 to 40% per echo May 2012; EF remains 35 to 40% per echo Feb 2013. Referred for ICD  . Myocardial infarction ([place])   . Osteoarthritis of right shoulder region    ACUTE PAIN  . 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 Pulmonary hypertension, moderate to severe [facility])     Past Surgical History: Past Surgical History:  Procedure Laterality Date  . CARDIAC CATHETERIZATION  2009   Grafts patent  . CARDIAC CATHETERIZATION  2012   Grafts patent. EF was 25 to 30% but 35 to 40 by echo  . CARDIAC CATHETERIZATION  2003  . CARDIAC DEFIBRILLATOR PLACEMENT  10/12/2016   MDT single chamber device  . CARDIOVERSION  2008--  FOR PAF   SUCCESSFUL  . CARDIOVERSION N/A 06/13/2013   Procedure: CARDIOVERSION;  Surgeon: Peter M Martinique, MD;  Location: [facility] ENDOSCOPY;  Service: Cardiovascular;   Laterality: N/A;  . CATARACT EXTRACTION, BILATERAL Bilateral   . CORONARY ARTERY BYPASS GRAFT  2002   SVG to PDA, Free radial to Intermediate, LIMA to LAD by Dr Prescott Gum  . ICD IMPLANT N/A 10/12/2016   Procedure: ICD Implant;  Surgeon: Constance Haw, MD;  Location: [place] CV LAB;  Service: Cardiovascular;  Laterality: N/A;  . KNEE ARTHROSCOPY Left 2000  . RIGHT HEART CATH N/A 08/08/2016   Procedure: Right Heart Cath;  Surgeon: Kirk Ruths  Claris Gladden, MD;  Location: [place] CV LAB;  Service: Cardiovascular;  Laterality: N/A;  . Right Leg surgeries Right X 5-6   due to polio; "relocated muscles; pins in ankle"  . SHOULDER ARTHROSCOPY W/ ROTATOR CUFF REPAIR Right 05/16/2011    Family History: Family History  Problem Relation Age of Onset  . Valvular heart disease Father   . Colon cancer Neg Hx   . Colon polyps Neg Hx   . Rectal cancer Neg Hx   . Stomach cancer Neg Hx     Social History: Social History   Social History  . Marital status: Widowed    Spouse name: N/A  . Number of children: 1  . Years of education: N/A   Occupational History  . 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  [employer]   Social History Main Topics  . Smoking status: Former Smoker    Packs/day: 2.00    Years: 20.00    Types: Cigarettes  . Smokeless tobacco: Never Used     Comment: 10/12/2016 "haven't had a cigarette since 1980"  . Alcohol use 7.2 oz/week    4 Shots of liquor, 8 Cans of beer per week     Comment: 10/12/2016 "couple beers and a shot of [place] most  3-4 times/week"  . Drug use: No  . Sexual activity: No   Other Topics Concern  . None   Social History Narrative   Pt lives in [place] with spouse (who is a Systems analyst).  1 grown healthy daughter who is a Insurance claims handler.       Retired 2003 from [employer].  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    Allergies:  Allergies  Allergen Reactions  . Lidocaine Other  (See Comments)    Passed out - at the dentist's office  . Procaine Hcl Other (See Comments)    Passed out - at the dentist's office    Objective:    Vital Signs:   Temp:  [97.6 F (36.4 C)] 97.6 F (36.4 C) (08/06 0445) Pulse Rate:  [75-115] 82 (08/06 0930) Resp:  [15-28] 25 (08/06 0930) BP: (92-107)/(52-81) 98/77 (08/06 0930) SpO2:  [92 %-100 %] 92 % (08/06 0930) Weight:  [160 lb (72.6 kg)] 160 lb (72.6 kg) (08/06 0447)    Weight change: Filed Weights   01/01/17 0447  Weight: 160 lb (72.6 kg)    Intake/Output:  No intake or output data in the 24 hours ending 01/01/17 1038    Physical Exam    General:  Ill appearing male, lying in bed. NAD.  HEENT: normal Neck: supple. JVP to ear . Carotids 2+ bilat; no bruits. No lymphadenopathy or thyromegaly appreciated. Cor: PMI nondisplaced. Irregular rate and rhythm. No rubs, gallops or murmurs. Lungs: R upper lobe with crepitus. Diminished in bases. Normal effort.  Abdomen: soft, nontender, slightly distended. No hepatosplenomegaly. No bruits or masses. Good bowel sounds. Extremities: no cyanosis, clubbing, rash, edema Neuro: alert & orientedx3, cranial nerves grossly intact. moves all 4 extremities w/o difficulty. Affect pleasant   Telemetry   Afib, V paced - Personally reviewed.   EKG    12/21/16 - Afib rate 100 - personally reviewed.   Labs   Basic Metabolic Panel:  Recent Labs Lab 12/26/16 0248 12/27/16 0236 01/01/17 0529  NA 133* 130* 129*  K 3.6 4.5 6.5*  CL 89* 87* 91*  CO2 35* 33* 25  GLUCOSE 95 113* 106*  BUN 66* 73* 95*  CREATININE 2.08* 2.22* 3.79*  CALCIUM 9.0 9.1 9.3    Liver Function Tests:  Recent Labs Lab 01/01/17 0529  AST 48*  ALT 26  ALKPHOS 87  BILITOT 1.5*  PROT 6.2*  ALBUMIN 3.3*   No results for input(s): LIPASE, AMYLASE in the last 168 hours. No results for input(s): AMMONIA in the last 168 hours.  CBC:  Recent Labs Lab 12/26/16 0248 01/01/17 0529  WBC 7.0 8.8    NEUTROABS 4.1 7.0  HGB 8.5* 8.9*  HCT 27.4* 28.8*  MCV 90.4 88.3  PLT 217 326    Cardiac Enzymes: No results for input(s): CKTOTAL, CKMB, CKMBINDEX, TROPONINI in the last 168 hours.  BNP: BNP (last 3 results)  Recent Labs  12/04/16 1128 12/14/16 1436 12/21/16 1154  BNP 1,974.7* 1,024.0* 3,403.3*    ProBNP (last 3 results) No results for input(s): PROBNP in the last 8760 hours.   CBG: No results for input(s): GLUCAP in the last 168 hours.  Coagulation Studies:  Recent Labs  01/01/17 0529  LABPROT 20.1*  INR 1.70     Imaging   Ct Abdomen Pelvis Wo Contrast  Result Date: 01/01/2017 CLINICAL DATA:  Status post blunt trauma to the abdomen. Concern for chest or abdominal injury. Initial encounter. EXAM: CT CHEST, ABDOMEN AND PELVIS WITHOUT CONTRAST TECHNIQUE: Multidetector CT imaging of the chest, abdomen and pelvis was performed following the standard protocol without IV contrast. COMPARISON:  Abdominal radiograph performed 11/28/2016 FINDINGS: CT CHEST FINDINGS Cardiovascular: The heart is mildly enlarged. Diffuse coronary artery calcifications are seen. The patient is status post median sternotomy. Scattered calcification is seen along the thoracic aorta. A right PICC is noted ending about the distal SVC. A pacemaker is noted at the left chest wall, with a single lead ending at the right ventricle. The great vessels demonstrate scattered calcification but are otherwise grossly unremarkable. Mediastinum/Nodes: Scattered enlarged right paratracheal nodes measure up to 1.4 cm in short axis. No pericardial effusion is identified. The thyroid gland is grossly unremarkable. No axillary lymphadenopathy is seen. Lungs/Pleura: A small right pleural effusion is noted. Diffuse interstitial prominence is somewhat chronic in appearance, extending to the periphery, raising concern for interstitial lung disease and pulmonary fibrosis. Would correlate for any associated symptoms. No  pneumothorax is identified. No dominant mass is seen. Musculoskeletal: No acute osseous abnormalities are identified. The visualized musculature is unremarkable in appearance. CT ABDOMEN PELVIS FINDINGS Hepatobiliary: The liver is unremarkable in appearance. The gallbladder is unremarkable in appearance. The common bile duct remains normal in caliber. Pancreas: The pancreas is within normal limits. Spleen: Trace fluid is noted about the spleen. The spleen is otherwise unremarkable. Adrenals/Urinary Tract: The adrenal glands are unremarkable in appearance. The kidneys are within normal limits. There is no evidence of hydronephrosis. No renal or ureteral stones are identified. Nonspecific perinephric stranding is noted bilaterally. Stomach/Bowel: The stomach is unremarkable in appearance. The small bowel is within normal limits. The appendix is normal in caliber, without evidence of appendicitis. The colon is unremarkable in appearance. Trace fluid is seen tracking along the paracolic gutters bilaterally. Vascular/Lymphatic: Diffuse calcification is seen along the abdominal aorta and its branches. The abdominal aorta is otherwise grossly unremarkable. The inferior vena cava is grossly unremarkable. No retroperitoneal lymphadenopathy is seen. No pelvic sidewall lymphadenopathy is identified. Reproductive: Apparent bladder wall thickening may reflect mild chronic inflammation or possibly cystitis. The prostate remains normal in size. Other: No additional soft tissue abnormalities are seen. Musculoskeletal: No acute osseous  abnormalities are identified. The visualized musculature is unremarkable in appearance. IMPRESSION: 1. No evidence of traumatic injury to the chest, abdomen or pelvis. 2. Small right pleural effusion. Diffuse interstitial prominence is somewhat chronic in appearance, extending to the periphery, raising concern for interstitial lung disease and pulmonary fibrosis. Would correlate for any associated  symptoms. 3. Apparent bladder wall thickening may reflect mild chronic inflammation or possibly cystitis. 4. Trace fluid noted along the paracolic gutters bilaterally. 5. Mild cardiomegaly.  Diffuse coronary artery calcifications seen. 6. Enlarged right paratracheal nodes measure up to 1.4 cm in short axis. 7. Diffuse aortic atherosclerosis noted. Electronically Signed   By: Garald Balding M.D.   On: 01/01/2017 06:27   Ct Chest Wo Contrast  Result Date: 01/01/2017 CLINICAL DATA:  Status post blunt trauma to the abdomen. Concern for chest or abdominal injury. Initial encounter. EXAM: CT CHEST, ABDOMEN AND PELVIS WITHOUT CONTRAST TECHNIQUE: Multidetector CT imaging of the chest, abdomen and pelvis was performed following the standard protocol without IV contrast. COMPARISON:  Abdominal radiograph performed 11/28/2016 FINDINGS: CT CHEST FINDINGS Cardiovascular: The heart is mildly enlarged. Diffuse coronary artery calcifications are seen. The patient is status post median sternotomy. Scattered calcification is seen along the thoracic aorta. A right PICC is noted ending about the distal SVC. A pacemaker is noted at the left chest wall, with a single lead ending at the right ventricle. The great vessels demonstrate scattered calcification but are otherwise grossly unremarkable. Mediastinum/Nodes: Scattered enlarged right paratracheal nodes measure up to 1.4 cm in short axis. No pericardial effusion is identified. The thyroid gland is grossly unremarkable. No axillary lymphadenopathy is seen. Lungs/Pleura: A small right pleural effusion is noted. Diffuse interstitial prominence is somewhat chronic in appearance, extending to the periphery, raising concern for interstitial lung disease and pulmonary fibrosis. Would correlate for any associated symptoms. No pneumothorax is identified. No dominant mass is seen. Musculoskeletal: No acute osseous abnormalities are identified. The visualized musculature is unremarkable in  appearance. CT ABDOMEN PELVIS FINDINGS Hepatobiliary: The liver is unremarkable in appearance. The gallbladder is unremarkable in appearance. The common bile duct remains normal in caliber. Pancreas: The pancreas is within normal limits. Spleen: Trace fluid is noted about the spleen. The spleen is otherwise unremarkable. Adrenals/Urinary Tract: The adrenal glands are unremarkable in appearance. The kidneys are within normal limits. There is no evidence of hydronephrosis. No renal or ureteral stones are identified. Nonspecific perinephric stranding is noted bilaterally. Stomach/Bowel: The stomach is unremarkable in appearance. The small bowel is within normal limits. The appendix is normal in caliber, without evidence of appendicitis. The colon is unremarkable in appearance. Trace fluid is seen tracking along the paracolic gutters bilaterally. Vascular/Lymphatic: Diffuse calcification is seen along the abdominal aorta and its branches. The abdominal aorta is otherwise grossly unremarkable. The inferior vena cava is grossly unremarkable. No retroperitoneal lymphadenopathy is seen. No pelvic sidewall lymphadenopathy is identified. Reproductive: Apparent bladder wall thickening may reflect mild chronic inflammation or possibly cystitis. The prostate remains normal in size. Other: No additional soft tissue abnormalities are seen. Musculoskeletal: No acute osseous abnormalities are identified. The visualized musculature is unremarkable in appearance. IMPRESSION: 1. No evidence of traumatic injury to the chest, abdomen or pelvis. 2. Small right pleural effusion. Diffuse interstitial prominence is somewhat chronic in appearance, extending to the periphery, raising concern for interstitial lung disease and pulmonary fibrosis. Would correlate for any associated symptoms. 3. Apparent bladder wall thickening may reflect mild chronic inflammation or possibly cystitis. 4. Trace fluid noted along the  paracolic gutters bilaterally.  5. Mild cardiomegaly.  Diffuse coronary artery calcifications seen. 6. Enlarged right paratracheal nodes measure up to 1.4 cm in short axis. 7. Diffuse aortic atherosclerosis noted. Electronically Signed   By: Garald Balding M.D.   On: 01/01/2017 06:27     Medications:     Current Medications: . allopurinol  100 mg Oral Daily  . amiodarone  200 mg Oral Daily  . atorvastatin  40 mg Oral QPM    Infusions: . sodium chloride        Patient Profile   Jason Choi is a 76 year old male with a past medical history of chronic systolic CHF due to ischemic cardiomyopathy (last echo July 2018 with EF 15%), status post Medtronic ICD, permanent atrial fibrillation. Admitted with fall on 01/01/17.   Assessment/Plan   1. Fall at home:  - No syncope, resulted from deconditioning.   2. 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 Echo (07/28/16) LVEF 15%, severe LV dilation, severe MR, mild RV dysfunction, Mod RAE, PA peak pressure 49 mm Hg.  - End stage, palliative care to see again this admission. He and I discussed this, he feels open to discussing goals of care. Family at bedside.  - Continue milrinone for now.  - discuss diuretics with MD   3. CAD: S/p CABG in 2002.  - No chest pain - Continue atorvastatin.   4. CKD: Stage III-IV - Baseline 2.0 - 2.2 - Creatinine on admit 3.79. Suspect from poor oral intake.   5. Permanent Afib - Rate controlled. On Amio for frequent PVC's last admission. Continue for now.   6. Hyperkalemia - K 6.5 - Repeat BMET, gentle IVF per primary.  7. Frequent PVCs with occasional NSVT - Improved. Continue Amio.    8. Goals of care - Discussed with Dr. [name], Primary team calling Palliative today. Will need family meeting. There seem to be some family issues regarding his POA.    Length of Stay: [place], NP  01/01/2017, 10:38 AM  Advanced Heart Failure Team Pager [phone] (M-F; 7a - 4p)  Please  contact [facility] for night-coverage after hours (4p -7a ) and weekends on [email or web address]  Patient seen with NP, agree with the above note.  Difficult situation.  Patient is profoundly weak and fell at home.  He remains on home milrinone but BUN/creatinine have risen with creatinine up to 3.79 and hyperkalemia.  However, on exam he appears to be volume overloaded with elevated JVP and CHF on CXR/CT.  - Would not give IV fluid.  - Treated elevated K => repeat BMET now.   - I will give Lasix 80 mg IV and follow response.   He is not a candidate for advanced therapies (LVAD, transplant) or HD.  He appears to be failing home milrinone.  Would involve palliative care for goals of care discussion.   Loralie Champagne 01/01/2017 6:32 PM

## 2017-01-01 NOTE — ED Notes (Signed)
Dr. Marily Memos paged to make him aware that patient is requesting ensure/boost, awaiting response from MD

## 2017-01-01 NOTE — ED Notes (Signed)
Informed provider of labs, will redrawal

## 2017-01-01 NOTE — ED Notes (Signed)
Pt aware of need for urine  

## 2017-01-01 NOTE — ED Notes (Signed)
Claiborne Billings, throughput RN made aware of patient and that he can go up to floor after 1930

## 2017-01-01 NOTE — Progress Notes (Signed)
[facility]  Mr. Chalk is an active with [facility] for home Milrinone in partnership with [facility] who is providing [facility] services.   [facility]  And [facility] hospital teams will follow Mr. Amor to support transition home if [facility] services are continued at [facility].  If patient discharges after hours, please call [phone].   Larry Sierras 01/01/2017, 9:10 PM

## 2017-01-01 NOTE — ED Notes (Signed)
Called 5W twice to give report. No answer x2.

## 2017-01-01 NOTE — ED Notes (Signed)
Pt transferred to hospital bed

## 2017-01-01 NOTE — Progress Notes (Signed)
Palliative Medicine consult noted. Due to high referral volume, there may be a delay seeing this patient. Please call the Palliative Medicine Team office at [phone] if recommendations are needed in the interim.  Thank you for inviting [place] to see this patient.  Marjie Skiff Anapaula Severt, RN, BSN, [facility] 01/01/2017 10:36 AM Cell [phone] 8:00-4:00 Monday-Friday Office [phone]

## 2017-01-01 NOTE — ED Notes (Signed)
Patient offered bed pan with flatus, no output.

## 2017-01-01 NOTE — ED Notes (Signed)
Pt had 1x occurrence of emesis, clear liquid. Denies feeling nauseas at this moment. Offered to get nausea medicine, pt refused.

## 2017-01-02 ENCOUNTER — Inpatient Hospital Stay (HOSPITAL_COMMUNITY): Admit: 2017-01-02 | Payer: Medicare Other | Admitting: Cardiology

## 2017-01-02 ENCOUNTER — Encounter (HOSPITAL_COMMUNITY): Payer: Self-pay | Admitting: General Practice

## 2017-01-02 DIAGNOSIS — E860 Dehydration: Secondary | ICD-10-CM | POA: Diagnosis present

## 2017-01-02 DIAGNOSIS — I5043 Acute on chronic combined systolic (congestive) and diastolic (congestive) heart failure: Secondary | ICD-10-CM | POA: Diagnosis not present

## 2017-01-02 DIAGNOSIS — I272 Pulmonary hypertension, unspecified: Secondary | ICD-10-CM | POA: Diagnosis present

## 2017-01-02 DIAGNOSIS — M1A0710 Idiopathic chronic gout, right ankle and foot, without tophus (tophi): Secondary | ICD-10-CM | POA: Diagnosis not present

## 2017-01-02 DIAGNOSIS — D631 Anemia in chronic kidney disease: Secondary | ICD-10-CM | POA: Diagnosis present

## 2017-01-02 DIAGNOSIS — Z66 Do not resuscitate: Secondary | ICD-10-CM | POA: Diagnosis present

## 2017-01-02 DIAGNOSIS — N189 Chronic kidney disease, unspecified: Secondary | ICD-10-CM | POA: Diagnosis not present

## 2017-01-02 DIAGNOSIS — I5023 Acute on chronic systolic (congestive) heart failure: Secondary | ICD-10-CM | POA: Diagnosis not present

## 2017-01-02 DIAGNOSIS — Z7189 Other specified counseling: Secondary | ICD-10-CM | POA: Diagnosis not present

## 2017-01-02 DIAGNOSIS — N179 Acute kidney failure, unspecified: Secondary | ICD-10-CM | POA: Diagnosis not present

## 2017-01-02 DIAGNOSIS — M109 Gout, unspecified: Secondary | ICD-10-CM | POA: Diagnosis present

## 2017-01-02 DIAGNOSIS — E7800 Pure hypercholesterolemia, unspecified: Secondary | ICD-10-CM | POA: Diagnosis present

## 2017-01-02 DIAGNOSIS — I482 Chronic atrial fibrillation: Secondary | ICD-10-CM | POA: Diagnosis present

## 2017-01-02 DIAGNOSIS — I480 Paroxysmal atrial fibrillation: Secondary | ICD-10-CM | POA: Diagnosis present

## 2017-01-02 DIAGNOSIS — I255 Ischemic cardiomyopathy: Secondary | ICD-10-CM | POA: Diagnosis present

## 2017-01-02 DIAGNOSIS — R531 Weakness: Secondary | ICD-10-CM | POA: Diagnosis not present

## 2017-01-02 DIAGNOSIS — I5022 Chronic systolic (congestive) heart failure: Secondary | ICD-10-CM | POA: Diagnosis not present

## 2017-01-02 DIAGNOSIS — I130 Hypertensive heart and chronic kidney disease with heart failure and stage 1 through stage 4 chronic kidney disease, or unspecified chronic kidney disease: Secondary | ICD-10-CM | POA: Diagnosis present

## 2017-01-02 DIAGNOSIS — R1012 Left upper quadrant pain: Secondary | ICD-10-CM

## 2017-01-02 DIAGNOSIS — I252 Old myocardial infarction: Secondary | ICD-10-CM | POA: Diagnosis not present

## 2017-01-02 DIAGNOSIS — I2510 Atherosclerotic heart disease of native coronary artery without angina pectoris: Secondary | ICD-10-CM | POA: Diagnosis present

## 2017-01-02 DIAGNOSIS — E785 Hyperlipidemia, unspecified: Secondary | ICD-10-CM | POA: Diagnosis present

## 2017-01-02 DIAGNOSIS — W2209XA Striking against other stationary object, initial encounter: Secondary | ICD-10-CM | POA: Diagnosis present

## 2017-01-02 DIAGNOSIS — E871 Hypo-osmolality and hyponatremia: Secondary | ICD-10-CM | POA: Diagnosis not present

## 2017-01-02 DIAGNOSIS — Z515 Encounter for palliative care: Secondary | ICD-10-CM | POA: Diagnosis not present

## 2017-01-02 DIAGNOSIS — E875 Hyperkalemia: Secondary | ICD-10-CM | POA: Diagnosis not present

## 2017-01-02 DIAGNOSIS — I10 Essential (primary) hypertension: Secondary | ICD-10-CM | POA: Diagnosis not present

## 2017-01-02 DIAGNOSIS — N184 Chronic kidney disease, stage 4 (severe): Secondary | ICD-10-CM | POA: Diagnosis present

## 2017-01-02 DIAGNOSIS — K767 Hepatorenal syndrome: Secondary | ICD-10-CM | POA: Diagnosis not present

## 2017-01-02 DIAGNOSIS — Y92009 Unspecified place in unspecified non-institutional (private) residence as the place of occurrence of the external cause: Secondary | ICD-10-CM | POA: Diagnosis not present

## 2017-01-02 DIAGNOSIS — I472 Ventricular tachycardia: Secondary | ICD-10-CM | POA: Diagnosis present

## 2017-01-02 DIAGNOSIS — L97411 Non-pressure chronic ulcer of right heel and midfoot limited to breakdown of skin: Secondary | ICD-10-CM | POA: Diagnosis present

## 2017-01-02 DIAGNOSIS — I493 Ventricular premature depolarization: Secondary | ICD-10-CM | POA: Diagnosis present

## 2017-01-02 DIAGNOSIS — W010XXA Fall on same level from slipping, tripping and stumbling without subsequent striking against object, initial encounter: Secondary | ICD-10-CM | POA: Diagnosis present

## 2017-01-02 DIAGNOSIS — M19011 Primary osteoarthritis, right shoulder: Secondary | ICD-10-CM | POA: Diagnosis present

## 2017-01-02 DIAGNOSIS — I5084 End stage heart failure: Secondary | ICD-10-CM | POA: Diagnosis present

## 2017-01-02 DIAGNOSIS — R0781 Pleurodynia: Secondary | ICD-10-CM | POA: Diagnosis present

## 2017-01-02 LAB — BASIC METABOLIC PANEL
Anion gap: 15 (ref 5–15)
Anion gap: 15 (ref 5–15)
BUN: 103 mg/dL — AB (ref 6–20)
BUN: 105 mg/dL — AB (ref 6–20)
CALCIUM: 9 mg/dL (ref 8.9–10.3)
CO2: 26 mmol/L (ref 22–32)
CO2: 24 mmol/L (ref 22–32)
CREATININE: 4.24 mg/dL — AB (ref 0.61–1.24)
Calcium: 9.1 mg/dL (ref 8.9–10.3)
Chloride: 90 mmol/L — ABNORMAL LOW (ref 101–111)
Chloride: 88 mmol/L — ABNORMAL LOW (ref 101–111)
Creatinine, Ser: 4.36 mg/dL — ABNORMAL HIGH (ref 0.61–1.24)
GFR calc Af Amer: 15 mL/min — ABNORMAL LOW (ref >60)
GFR calc Af Amer: 14 mL/min — ABNORMAL LOW (ref >60)
GFR, EST NON AFRICAN AMERICAN: 13 mL/min — AB (ref >60)
GFR, EST NON AFRICAN AMERICAN: 12 mL/min — AB (ref >60)
GLUCOSE: 117 mg/dL — AB (ref 65–99)
GLUCOSE: 142 mg/dL — AB (ref 65–99)
POTASSIUM: 5.8 mmol/L — AB (ref 3.5–5.1)
Potassium: 6 mmol/L — ABNORMAL HIGH (ref 3.5–5.1)
SODIUM: 131 mmol/L — AB (ref 135–145)
SODIUM: 127 mmol/L — AB (ref 135–145)

## 2017-01-02 LAB — CBC
HCT: 28.9 % — ABNORMAL LOW (ref 39.0–52.0)
Hemoglobin: 9 g/dL — ABNORMAL LOW (ref 13.0–17.0)
MCH: 27.2 pg (ref 26.0–34.0)
MCHC: 31.1 g/dL (ref 30.0–36.0)
MCV: 87.3 fL (ref 78.0–100.0)
PLATELETS: 321 10*3/uL (ref 150–400)
RBC: 3.31 MIL/uL — AB (ref 4.22–5.81)
RDW: 19.2 % — ABNORMAL HIGH (ref 11.5–15.5)
WBC: 9.6 10*3/uL (ref 4.0–10.5)

## 2017-01-02 LAB — COOXEMETRY PANEL
CARBOXYHEMOGLOBIN: 1.2 % (ref 0.5–1.5)
METHEMOGLOBIN: 0.9 % (ref 0.0–1.5)
O2 Saturation: 40.1 %
TOTAL HEMOGLOBIN: 9.7 g/dL — AB (ref 12.0–16.0)

## 2017-01-02 LAB — PROTIME-INR
INR: 1.89
Prothrombin Time: 21.9 seconds — ABNORMAL HIGH (ref 11.4–15.2)

## 2017-01-02 MED ORDER — WARFARIN SODIUM 7.5 MG PO TABS
7.5000 mg | ORAL_TABLET | Freq: Once | ORAL | Status: AC
  Administered 2017-01-02: 7.5 mg via ORAL
  Filled 2017-01-02: qty 1

## 2017-01-02 MED ORDER — SODIUM POLYSTYRENE SULFONATE 15 GM/60ML PO SUSP
30.0000 g | Freq: Once | ORAL | Status: AC
  Administered 2017-01-02: 30 g via ORAL
  Filled 2017-01-02: qty 120

## 2017-01-02 MED ORDER — FUROSEMIDE 10 MG/ML IJ SOLN
120.0000 mg | Freq: Once | INTRAVENOUS | Status: AC
  Administered 2017-01-02: 120 mg via INTRAVENOUS
  Filled 2017-01-02: qty 12

## 2017-01-02 MED ORDER — MILRINONE LACTATE IN DEXTROSE 20-5 MG/100ML-% IV SOLN
0.3750 ug/kg/min | INTRAVENOUS | Status: DC
  Administered 2017-01-02: 0.375 ug/kg/min via INTRAVENOUS
  Administered 2017-01-02: 0.25 ug/kg/min via INTRAVENOUS
  Filled 2017-01-02 (×2): qty 100

## 2017-01-02 NOTE — Evaluation (Addendum)
Physical Therapy Evaluation Patient Details Name: Jason Choi MRN: [id number] DOB: 21-Jul-1940 Today's Date: 01/02/2017   History of Present Illness  76 year old male with a past medical history of chronic systolic CHF due to ischemic cardiomyopathy (last echo July 2018 with EF 15%), status post [employer] ICD, permanent atrial fibrillation. Admitted with fall on 01/01/17.  Clinical Impression  Orders received for PT evaluation. Patient demonstrates deficits in functional mobility as indicated below. Will benefit from continued skilled PT to address deficits and maximize function. Will see as indicated and progress as tolerated.  OF NOTE: Session limited to bed eval per patient's request stating he feels "lousy and does not wish to get OOB today."  States that plan is to return home if possible and initiate therapies, will further assess mobility and safety for this plan next session.    Follow Up Recommendations Home health PT;Supervision for mobility/OOB;Supervision/Assistance - 24 hour    Equipment Recommendations  None recommended by PT    Recommendations for Other Services       Precautions / Restrictions Precautions Precautions: Fall Precaution Comments: watch 02; Rt foot wound Restrictions Weight Bearing Restrictions: No      Mobility  Bed Mobility Overal bed mobility: Needs Assistance Bed Mobility: Supine to Sit     Supine to sit: Min assist     General bed mobility comments: min assist to pull to upright at long sitting  Transfers                 General transfer comment: defferred OOB activity at this time  Ambulation/Gait             General Gait Details: defferred OOB activity at this time  Stairs            Wheelchair Mobility    Modified Rankin (Stroke Patients Only)       Balance Overall balance assessment: History of Falls                                           Pertinent Vitals/Pain Pain Assessment:  0-10 Pain Score: 3  Pain Location: left ribs Pain Descriptors / Indicators: Aching Pain Intervention(s): Monitored during session    Home Living Family/patient expects to be discharged to:: Private residence Living Arrangements: Alone Available Help at Discharge: Available PRN/intermittently;Personal care attendant (5 hours a day) Type of Home: House Home Access: Stairs to enter Entrance Stairs-Rails: Psychiatric nurse of Steps: 3 Home Layout: One level Home Equipment: Grab bars - tub/shower;Walker - 2 wheels;Cane - single point;Wheelchair - manual;Shower seat      Prior Function Level of Independence: Independent with assistive device(s);Needs assistance   Gait / Transfers Assistance Needed: walks with a cane  ADL's / Homemaking Assistance Needed: performs personal care modified independently, sponge bathes due to foot wound, assisted for IADL by aide  Comments: pt was working until March, aide drives him     Hand Dominance   Dominant Hand: Right    Extremity/Trunk Assessment   Upper Extremity Assessment Upper Extremity Assessment: Generalized weakness    Lower Extremity Assessment Lower Extremity Assessment: RLE deficits/detail;Generalized weakness (bilateral generalized weakness L>R (+arthritic changes)) RLE Deficits / Details: Rt foot wound not visualized,  RLE Coordination: decreased fine motor       Communication   Communication: No difficulties  Cognition Arousal/Alertness: Awake/alert Behavior During Therapy: Flat affect Overall Cognitive  Status: Within Functional Limits for tasks assessed                                        General Comments      Exercises     Assessment/Plan    PT Assessment Patient needs continued PT services  PT Problem List Decreased strength;Decreased mobility;Decreased safety awareness;Pain;Decreased balance;Cardiopulmonary status limiting activity;Decreased activity tolerance       PT  Treatment Interventions Therapeutic activities;Therapeutic exercise;Gait training;Patient/family education;Balance training;Functional mobility training;Stair training    PT Goals (Current goals can be found in the Care Plan section)  Acute Rehab PT Goals Patient Stated Goal: to return home ASAP PT Goal Formulation: With patient Time For Goal Achievement: 01/08/17 Potential to Achieve Goals: Fair    Frequency Min 3X/week   Barriers to discharge Decreased caregiver support;Inaccessible home environment      Co-evaluation               AM-PAC PT "6 Clicks" Daily Activity  Outcome Measure Difficulty turning over in bed (including adjusting bedclothes, sheets and blankets)?: A Little Difficulty moving from lying on back to sitting on the side of the bed? : A Little Difficulty sitting down on and standing up from a chair with arms (e.g., wheelchair, bedside commode, etc,.)?: A Little Help needed moving to and from a bed to chair (including a wheelchair)?: A Little Help needed walking in hospital room?: A Little Help needed climbing 3-5 steps with a railing? : A Lot 6 Click Score: 17    End of Session Equipment Utilized During Treatment: Oxygen Activity Tolerance: Patient limited by fatigue;Treatment limited secondary to medical complications (Comment) (increased DOE and desaturations) Patient left: in bed;with call bell/phone within reach;with bed alarm set Nurse Communication: Mobility status PT Visit Diagnosis: Unsteadiness on feet (R26.81);Other (comment);Muscle weakness (generalized) (M62.81)    Time: 1443-1540 PT Time Calculation (min) (ACUTE ONLY): 13 min   Charges:   PT Evaluation $PT Eval Moderate Complexity: 1 Mod     PT G Codes:   PT G-Codes **NOT FOR INPATIENT CLASS** Functional Assessment Tool Used: Clinical judgement Functional Limitation: Mobility: Walking and moving around Mobility: Walking and Moving Around Current Status (G8676): At least 40 percent but  less than 60 percent impaired, limited or restricted Mobility: Walking and Moving Around Goal Status [phone]): At least 1 percent but less than 20 percent impaired, limited or restricted    Alben Deeds, PT DPT  Board Certified Neurologic Specialist [place] 01/02/2017, 9:45 AM

## 2017-01-02 NOTE — Consult Note (Signed)
WOC consult note: Pt is familiar to [facility] team from recent admission; refer to note on 7/30.  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  He was due for an appointment yesterday, which he was not able to attend since he was in the hospital. This dressing is not available in the [facility] inpatient formulary. Removed outer dressings and changed 2X2 gauze, kerlex, and coban.  Protective dressing in place over Oasis site with steri strips, this was not disturbed. Area is .5X.5cm and yellow, with small amt yellow drainage, no odor; unchanged since previous assessment. New dressing should remain in place until next Monday.  Pt can follow-up at the outpatient wound center when discharged and family members deny further questions. Please re-consult if further assistance is needed.  Thank-you,  Julien Girt MSN, [place], [place], [place], [place]

## 2017-01-02 NOTE — Progress Notes (Signed)
PROGRESS NOTE    Jason Choi  UKG:[id number] DOB: 03/09/1941 DOA: 01/01/2017 PCP: Heywood Bene, PA-C   Brief Narrative:  Jason Choi is a 76 y.o. male with medical history significant of severe pulmonary hypertension, MI/CAD, CKD, systolic congestive heart failure, atrophic fibrillation status post AICD placement, hypertension, hyperlipidemia.. Patient reporting after fall at home and his bathroom. Patient states he has been feeling more weak as of late but denies any lower extremity swelling, shortness of breath orthopnea, wheezing, fevers, nausea, vomiting. Patient does endorse several loose stools per day over the last several days since discharge but denies any hematochezia, melena or abdominal pain. Patient states that he went to the bathroom on the morning of admission and then unable to ambulate out of the bathroom.   Upon returning to grab his cane he lost his balance and fell to the ground striking his left arm and side patient also states that his head barely hit the door but did not lose consciousness denies headache or bleeding from his head. Patient states he normally uses a for walker to ambulate. Patient states he lives by himself and his medical alert button to call EMS to his home. Patient thinks she is on the ground for approximately 30 minutes before EMS services were able to take his door off the hinges and get him out. Currently complaining of left rib and neck stiffness and pain. Patient states that prior to the hospital he had a good workout but reports eating and drinking very little over the last several days. Admitted and felt his he has progressive Cardiorenal Syndrome with Cariology Consulted. Palliative Care medicine also consulted and family discussion on 8/8.   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  -Near recent baseline.  -Likely from Volume Overload - Gentle NS IVF stopped - IV Lasix Given - Repeat CMP in AM  Afib/CHF:  - Echo (07/28/16) LVEF 15%, severe LV dilation, severe MR, mild RV dysfunction, Mod RAE, PA peak pressure 49 mm Hg.  - AICD in place but now turned off -Pt recently w/ admission for CHF. -On milranone. I discussed the case w/ cardiology/CHF teams and they will see the pt in consultation.  - Continue amio, lipitor - Continue coumadin - Further mgt per cardiology - milrinone  - Cardiology continuing Milrinone gtt and Co-Ox  - Cards giving Lasix Challenge at 120 mg IV - Palliative Care to Consult; [place] with Rib Pain -Mechanical fall. Pt w/ what sounds like progressive physical deconditioning.  -May need placement as opposed to living by self. Sustained skin tears on arms and L rib pain. Pt thinks he was down for 30 minutes. -  PT/OT - Palliative Care Discussion - Pain control with Fentanyl and Oxycodone; Acetaminophen 1000 mg po Daily   Gout: - Continue Allopurinol  Anemia:  - Hgb 9.0. At baseline - Monitor for S/Sx of Bleeding - CBC in am  Right Heel Wound -WOC Nurse appreciated  DVT prophylaxis: Anticoagulated with Coumadin Code Status: DO NOT RESUSCITATE Family Communication: Discussed with  Sister at Bedside Disposition Plan: Possible Hospice; Palliative Care Discussion with Family on 8/8  Consultants:   Cardiology  Palliative Care Medicine  Procedures:    Antimicrobials: Anti-infectives    None     Subjective: Seen and examined at bedside and knows he is not doing well. States his "heart sucks and his kidneys are failing." Complaining of Right Rib Pain.   Objective: Vitals:   01/02/17 0609 01/02/17 0610 01/02/17 1750 01/02/17 2006  BP:  93/66 95/68 96/63   Pulse:  92 (!) 107 90  Resp:  18 19 18   Temp:  97.7 F (36.5 C) (!) 97.5 F (36.4 C) 97.6 F (36.4 C)  TempSrc:  Oral Oral Axillary  SpO2:  96% 96% 98%  Weight: 76.2 kg (167 lb 14.4 oz)     Height:        Intake/Output Summary (Last 24 hours) at 01/02/17 2346 Last data filed at 01/02/17 1236  Gross per 24 hour  Intake              120 ml  Output              175 ml  Net              -55 ml   Filed Weights   01/01/17 0447 01/02/17 0609  Weight: 72.6 kg (160 lb) 76.2 kg (167 lb 14.4 oz)   Examination: Physical Exam:  Constitutional: Chronically ill appearing Caucasian male appears uncomfortable Eyes: Lids and conjunctivae normal, sclerae anicteric  ENMT: External Ears, Nose appear normal. Grossly normal hearing. Mucous membranes are moist.  Neck: Appears normal, supple, no cervical masses, normal ROM, no appreciable thyromegaly; Has JVD Respiratory: Diminished to auscultation bilaterally, no wheezing, rales, rhonchi or crackles. Normal respiratory effort and patient is not tachypenic. No accessory muscle use.  Cardiovascular: Irregularly Irregular, no murmurs / rubs / gallops. 1+ extremity edema. 2+ pedal pulses. No carotid bruits.  Abdomen: Soft, non-tender, non-distended. No masses palpated. No appreciable hepatosplenomegaly. Bowel sounds positive.  GU: Deferred. Musculoskeletal: No clubbing / cyanosis of digits/nails. No joint deformity upper and lower extremities.  Skin: No rashes on a  limited skin eval No induration; Warm and dry.  Neurologic: CN 2-12 grossly intact with no focal deficits. Strength 5/5 in all 4. Romberg sign cerebellar reflexes not assessed.  Psychiatric: Normal judgment and insight. Alert and oriented x 3. Anxious mood and appropriate affect.   Data Reviewed: I have personally reviewed following labs and imaging studies  CBC:  Recent Labs Lab 01/01/17 0529 01/02/17 0433  WBC 8.8 9.6  NEUTROABS 7.0  --   HGB 8.9* 9.0*  HCT 28.8* 28.9*  MCV 88.3 87.3  PLT 326 073   Basic Metabolic Panel:  Recent Labs Lab 12/27/16 0236 01/01/17 0529 01/01/17 2254 01/02/17 0433 01/02/17 1110  NA 130* 129* 129* 131* 127*  K 4.5 6.5* 6.0* 5.8* 6.0*  CL 87* 91* 90* 90* 88*  CO2 33* 25 26 26 24   GLUCOSE 113* 106* 130* 117* 142*  BUN 73* 95* 100* 103* 105*  CREATININE 2.22* 3.79* 4.11* 4.24* 4.36*  CALCIUM 9.1 9.3 9.3 9.1  9.0   GFR: Estimated Creatinine Clearance: 16 mL/min (A) (by C-G formula based on SCr of 4.36 mg/dL (H)). Liver Function Tests:  Recent Labs Lab 01/01/17 0529  AST 48*  ALT 26  ALKPHOS 87  BILITOT 1.5*  PROT 6.2*  ALBUMIN 3.3*   No results for input(s): LIPASE, AMYLASE in the last 168 hours. No results for input(s): AMMONIA in the last 168 hours. Coagulation Profile:  Recent Labs Lab 12/27/16 0236 01/01/17 0529 01/02/17 0433  INR 2.34 1.70 1.89   Cardiac Enzymes: No results for input(s): CKTOTAL, CKMB, CKMBINDEX, TROPONINI in the last 168 hours. BNP (last 3 results) No results for input(s): PROBNP in the last 8760 hours. HbA1C: No results for input(s): HGBA1C in the last 72 hours. CBG: No results for input(s): GLUCAP in the last 168 hours. Lipid Profile: No results for input(s): CHOL, HDL, LDLCALC, TRIG, CHOLHDL, LDLDIRECT in the last 72 hours. Thyroid Function Tests: No results for input(s): TSH, T4TOTAL, FREET4, T3FREE, THYROIDAB in the last 72 hours. Anemia Panel: No results for input(s): VITAMINB12, FOLATE,  FERRITIN, TIBC, IRON, RETICCTPCT in the last 72 hours. Sepsis Labs: No results for input(s): PROCALCITON, LATICACIDVEN in the last 168 hours.  No results found for this or any previous visit (from the past 240 hour(s)).   Radiology Studies: Ct Abdomen Pelvis Wo Contrast  Result Date: 01/01/2017 CLINICAL DATA:  Status post blunt trauma to the abdomen. Concern for chest or abdominal injury. Initial encounter. EXAM: CT CHEST, ABDOMEN AND PELVIS WITHOUT CONTRAST TECHNIQUE: Multidetector CT imaging of the chest, abdomen and pelvis was performed following the standard protocol without IV contrast. COMPARISON:  Abdominal radiograph performed 11/28/2016 FINDINGS: CT CHEST FINDINGS Cardiovascular: The heart is mildly enlarged. Diffuse coronary artery calcifications are seen. The patient is status post median sternotomy. Scattered calcification is seen along the thoracic aorta. A right PICC is noted ending about the distal SVC. A pacemaker is noted at the left chest wall, with a single lead ending at the right ventricle. The great vessels demonstrate scattered calcification but are otherwise grossly unremarkable. Mediastinum/Nodes: Scattered enlarged right paratracheal nodes measure up to 1.4 cm in short axis. No pericardial effusion is identified. The thyroid gland is grossly unremarkable. No axillary lymphadenopathy is seen. Lungs/Pleura: A small right pleural effusion is noted. Diffuse interstitial prominence is somewhat chronic in appearance, extending to the periphery, raising concern for interstitial lung disease and pulmonary fibrosis. Would correlate for any associated symptoms. No pneumothorax is identified. No dominant mass is seen. Musculoskeletal: No acute osseous abnormalities are identified. The visualized musculature is unremarkable in appearance. CT ABDOMEN PELVIS FINDINGS Hepatobiliary: The liver is unremarkable in appearance. The gallbladder is unremarkable in appearance. The common bile duct remains  normal in caliber. Pancreas: The pancreas is within normal limits. Spleen: Trace fluid is noted about the spleen. The spleen is otherwise unremarkable. Adrenals/Urinary Tract: The adrenal glands are unremarkable in appearance. The kidneys are within normal limits. There is no evidence of hydronephrosis. No renal or ureteral stones are identified. Nonspecific perinephric stranding is noted bilaterally. Stomach/Bowel: The stomach is unremarkable in appearance. The small bowel is within normal limits. The appendix is normal in caliber, without evidence of appendicitis. The colon is unremarkable in appearance. Trace fluid is seen tracking along the paracolic gutters bilaterally. Vascular/Lymphatic: Diffuse calcification is seen along the abdominal aorta and its branches. The abdominal aorta is otherwise grossly unremarkable. The inferior vena cava is grossly unremarkable. No retroperitoneal lymphadenopathy is seen. No pelvic sidewall lymphadenopathy is identified. Reproductive:  Apparent bladder wall thickening may reflect mild chronic inflammation or possibly cystitis. The prostate remains normal in size. Other: No additional soft tissue abnormalities are seen. Musculoskeletal: No acute osseous abnormalities are identified. The visualized musculature is unremarkable in appearance. IMPRESSION: 1. No evidence of traumatic injury to the chest, abdomen or pelvis. 2. Small right pleural effusion. Diffuse interstitial prominence is somewhat chronic in appearance, extending to the periphery, raising concern for interstitial lung disease and pulmonary fibrosis. Would correlate for any associated symptoms. 3. Apparent bladder wall thickening may reflect mild chronic inflammation or possibly cystitis. 4. Trace fluid noted along the paracolic gutters bilaterally. 5. Mild cardiomegaly.  Diffuse coronary artery calcifications seen. 6. Enlarged right paratracheal nodes measure up to 1.4 cm in short axis. 7. Diffuse aortic  atherosclerosis noted. Electronically Signed   By: Garald Balding M.D.   On: 01/01/2017 06:27   Ct Chest Wo Contrast  Result Date: 01/01/2017 CLINICAL DATA:  Status post blunt trauma to the abdomen. Concern for chest or abdominal injury. Initial encounter. EXAM: CT CHEST, ABDOMEN AND PELVIS WITHOUT CONTRAST TECHNIQUE: Multidetector CT imaging of the chest, abdomen and pelvis was performed following the standard protocol without IV contrast. COMPARISON:  Abdominal radiograph performed 11/28/2016 FINDINGS: CT CHEST FINDINGS Cardiovascular: The heart is mildly enlarged. Diffuse coronary artery calcifications are seen. The patient is status post median sternotomy. Scattered calcification is seen along the thoracic aorta. A right PICC is noted ending about the distal SVC. A pacemaker is noted at the left chest wall, with a single lead ending at the right ventricle. The great vessels demonstrate scattered calcification but are otherwise grossly unremarkable. Mediastinum/Nodes: Scattered enlarged right paratracheal nodes measure up to 1.4 cm in short axis. No pericardial effusion is identified. The thyroid gland is grossly unremarkable. No axillary lymphadenopathy is seen. Lungs/Pleura: A small right pleural effusion is noted. Diffuse interstitial prominence is somewhat chronic in appearance, extending to the periphery, raising concern for interstitial lung disease and pulmonary fibrosis. Would correlate for any associated symptoms. No pneumothorax is identified. No dominant mass is seen. Musculoskeletal: No acute osseous abnormalities are identified. The visualized musculature is unremarkable in appearance. CT ABDOMEN PELVIS FINDINGS Hepatobiliary: The liver is unremarkable in appearance. The gallbladder is unremarkable in appearance. The common bile duct remains normal in caliber. Pancreas: The pancreas is within normal limits. Spleen: Trace fluid is noted about the spleen. The spleen is otherwise unremarkable.  Adrenals/Urinary Tract: The adrenal glands are unremarkable in appearance. The kidneys are within normal limits. There is no evidence of hydronephrosis. No renal or ureteral stones are identified. Nonspecific perinephric stranding is noted bilaterally. Stomach/Bowel: The stomach is unremarkable in appearance. The small bowel is within normal limits. The appendix is normal in caliber, without evidence of appendicitis. The colon is unremarkable in appearance. Trace fluid is seen tracking along the paracolic gutters bilaterally. Vascular/Lymphatic: Diffuse calcification is seen along the abdominal aorta and its branches. The abdominal aorta is otherwise grossly unremarkable. The inferior vena cava is grossly unremarkable. No retroperitoneal lymphadenopathy is seen. No pelvic sidewall lymphadenopathy is identified. Reproductive: Apparent bladder wall thickening may reflect mild chronic inflammation or possibly cystitis. The prostate remains normal in size. Other: No additional soft tissue abnormalities are seen. Musculoskeletal: No acute osseous abnormalities are identified. The visualized musculature is unremarkable in appearance. IMPRESSION: 1. No evidence of traumatic injury to the chest, abdomen or pelvis. 2. Small right pleural effusion. Diffuse interstitial prominence is somewhat chronic in appearance, extending to the periphery, raising concern for interstitial  lung disease and pulmonary fibrosis. Would correlate for any associated symptoms. 3. Apparent bladder wall thickening may reflect mild chronic inflammation or possibly cystitis. 4. Trace fluid noted along the paracolic gutters bilaterally. 5. Mild cardiomegaly.  Diffuse coronary artery calcifications seen. 6. Enlarged right paratracheal nodes measure up to 1.4 cm in short axis. 7. Diffuse aortic atherosclerosis noted. Electronically Signed   By: Garald Balding M.D.   On: 01/01/2017 06:27   Scheduled Meds: . allopurinol  100 mg Oral Daily  . amiodarone   200 mg Oral Daily  . atorvastatin  40 mg Oral QPM  . feeding supplement (ENSURE ENLIVE)  237 mL Oral TID BM  . Warfarin - Pharmacist Dosing Inpatient   Does not apply q1800   Continuous Infusions: . milrinone 0.375 mcg/kg/min (01/02/17 2223)     LOS: 0 days   Kerney Elbe, DO [facility] Pager [phone]  If 7PM-7AM, please contact night-coverage [email or web address] Password [email or web address] 01/02/2017, 11:46 PM

## 2017-01-02 NOTE — Progress Notes (Addendum)
CM received consult: Resumption of care with [facility]. Please have Hospice liaison meet with pt and discuss services, he would like more information. CM to f/u with pt/pt's sister Jana Half after family meeting in am to discuss disposition needs. Whitman Hero RN,BSN,CM

## 2017-01-02 NOTE — Evaluation (Signed)
Occupational Therapy Evaluation Patient Details Name: Jason Choi MRN: [id number] DOB: June 15, 1940 Today's Date: 01/02/2017    History of Present Illness 76 year old male with a past medical history of chronic systolic CHF due to ischemic cardiomyopathy (last echo July 2018 with EF 15%), status post Medtronic ICD, permanent atrial fibrillation. Admitted with fall on 01/01/17.   Clinical Impression   PTA, pt was living alone and performing BADLs with an aide to assist with IADLs. Currently, pt requiring Max A +2 for adjusting position in bed, Max A bathing and dressing, and set up for grooming at bed level. Educated pt on OT and benefits of therapy; pt declined further therapy. Recommend dc home with 24 hour assistance once medically stable per physician. All acute OT needs met and will sign off.     Follow Up Recommendations   (Defer further OT needs to hospice care.)    Equipment Recommendations  None recommended by OT    Recommendations for Other Services       Precautions / Restrictions Precautions Precautions: Fall Precaution Comments: watch 02; Rt foot wound Restrictions Weight Bearing Restrictions: No      Mobility Bed Mobility Overal bed mobility: Needs Assistance             General bed mobility comments: Pt was Max A +2 to adjust position in bed.   Transfers                 General transfer comment: defferred OOB activity at this time    Balance Overall balance assessment: History of Falls                                         ADL either performed or assessed with clinical judgement   ADL Overall ADL's : Needs assistance/impaired                                       General ADL Comments: Due to weakness and pain, pt currently max a for dressing and bathing.  Depending on level of particaption, pt able to perform groom and feeding at bed level     Vision         Perception     Praxis      Pertinent  Vitals/Pain Pain Assessment: Faces Faces Pain Scale: Hurts even more Pain Location: Left ribs Pain Descriptors / Indicators: Aching;Constant;Discomfort;Grimacing;Guarding Pain Intervention(s): Monitored during session;Limited activity within patient's tolerance;Repositioned     Hand Dominance Right   Extremity/Trunk Assessment Upper Extremity Assessment Upper Extremity Assessment: Generalized weakness   Lower Extremity Assessment Lower Extremity Assessment: Defer to PT evaluation       Communication Communication Communication: No difficulties   Cognition Arousal/Alertness: Awake/alert Behavior During Therapy: Flat affect Overall Cognitive Status: Within Functional Limits for tasks assessed                                     General Comments  Pt sister present for eval. Educated pt on OT and possibly of HH therapies. Pt declined further therapy.    Exercises     Shoulder Instructions      Home Living Family/patient expects to be discharged to:: Private residence Living Arrangements: Alone Available Help at  Discharge: Available PRN/intermittently;Personal care attendant Type of Home: House Home Access: Stairs to enter [employer] of Steps: 3 Entrance Stairs-Rails: Right;Left Home Layout: One level     Bathroom Shower/Tub: Occupational psychologist: Handicapped height Bathroom Accessibility: Yes How Accessible: Accessible via walker Home Equipment: Grab bars - tub/shower;Walker - 2 wheels;Cane - single point;Wheelchair - manual;Shower seat   Additional Comments: Wife past away around Feb      Prior Functioning/Environment Level of Independence: Independent with assistive device(s);Needs assistance  Gait / Transfers Assistance Needed: walks with a cane ADL's / Homemaking Assistance Needed: performs personal care modified independently, sponge bathes due to foot wound, assisted for IADL by aide   Comments: pt was working until  March, aide drives him        OT Problem List: Decreased strength;Decreased activity tolerance;Impaired balance (sitting and/or standing);Cardiopulmonary status limiting activity;Pain      OT Treatment/Interventions:      OT Goals(Current goals can be found in the care plan section) Acute Rehab OT Goals Patient Stated Goal: to return home ASAP OT Goal Formulation: With patient Time For Goal Achievement: 01/02/17 Potential to Achieve Goals: Good  OT Frequency:     Barriers to D/C:            Co-evaluation              AM-PAC PT "6 Clicks" Daily Activity     Outcome Measure Help from another person eating meals?: A Little Help from another person taking care of personal grooming?: A Little Help from another person toileting, which includes using toliet, bedpan, or urinal?: A Lot Help from another person bathing (including washing, rinsing, drying)?: A Lot Help from another person to put on and taking off regular upper body clothing?: A Lot Help from another person to put on and taking off regular lower body clothing?: A Lot 6 Click Score: 14   End of Session Nurse Communication: Mobility status  Activity Tolerance: Patient limited by pain;Patient limited by fatigue Patient left: in bed;with call bell/phone within reach;with family/visitor present  OT Visit Diagnosis: Unsteadiness on feet (R26.81);Muscle weakness (generalized) (M62.81);Pain Pain - Right/Left: Left Pain - part of body:  (Ribs)                Time: 7972-8206 OT Time Calculation (min): 12 min Charges:  OT General Charges $OT Visit: 1 Procedure OT Evaluation $OT Eval Low Complexity: 1 Procedure G-Codes: OT G-codes **NOT FOR INPATIENT CLASS** Functional Assessment Tool Used: Clinical judgement Functional Limitation: Self care Self Care Current Status (O1561): At least 60 percent but less than 80 percent impaired, limited or restricted Self Care Goal Status (B3794): At least 20 percent but less than  40 percent impaired, limited or restricted Self Care Discharge Status [phone]): At least 60 percent but less than 80 percent impaired, limited or restricted   [place], OTR/L Acute Rehab Pager: [phone] Office: [place] 01/02/2017, 4:38 PM

## 2017-01-02 NOTE — Progress Notes (Signed)
[place] for warfarin Indication: atrial fibrillation  Allergies  Allergen Reactions  . Lidocaine Other (See Comments)    Passed out - at the dentist's office  . Procaine Hcl Other (See Comments)    Passed out - at the dentist's office   Patient Measurements: Height: 6\' 2"  (188 cm) Weight: 167 lb 14.4 oz (76.2 kg) IBW/kg (Calculated) : 82.2  Vital Signs: Temp: 97.7 F (36.5 C) (08/07 0610) Temp Source: Oral (08/07 0610) BP: 93/66 (08/07 0610) Pulse Rate: 92 (08/07 0610)  Labs:  Recent Labs  01/01/17 0529 01/01/17 2254 01/02/17 0433 01/02/17 1110  HGB 8.9*  --  9.0*  --   HCT 28.8*  --  28.9*  --   PLT 326  --  321  --   LABPROT 20.1*  --  21.9*  --   INR 1.70  --  1.89  --   CREATININE 3.79* 4.11* 4.24* 4.36*    Estimated Creatinine Clearance: 16 mL/min (A) (by C-G formula based on SCr of 4.36 mg/dL (H)).   Assessment: Jason Choi is a 76 y.o. male with significant cardiac medical history on warfarin PTA for atrial fibrillation. Last dose PTA on 8/5, INR subtherapeutic today 1.7. Patient and family report dose is alternating warfarin 5mg  daily except 2.5mg  on Tuesday and Thursday. The most recent clinic note states patient should be warfarin 6mg  daily except 3mg  on Wednesday. Provider does not wish to bridge with lovenox or heparin at this time. Baseline HgB low 8.9  INR still below goal at 1.89 this am, hgb stable. No bleeding noted, discussed changing milrinone to hospital supply with patient this am.  Goal of Therapy:  INR 2-3 Monitor platelets by anticoagulation protocol: Yes   Plan:  Warfarin 7.5mg  x1 daily Daily INC/CBC Monitor for s/sx of bleeding  Erin Hearing PharmD., BCPS Clinical Pharmacist Pager [phone] 01/02/2017 1:55 PM

## 2017-01-02 NOTE — Consult Note (Signed)
Consultation Note Date: 01/02/2017   Patient Name: Jason Choi  DOB: [birth year]  MRN: [id number]  Age / Sex: 76 y.o., male  PCP: Heywood Bene, PA-C Referring Physician: Kerney Elbe, DO  Reason for Consultation: Establishing goals of care and Psychosocial/spiritual support  HPI/Patient Profile: 76 y.o. male  with past medical history of end stage biventricular sCHF due to ischemic cardiomyopathy (EF 15%), s/p Medtronic ICD, permanent atrial fibrillation, and CAD. He was started on Milrinone during the last admission (7/26-8/2). He now presents after a fall at home. He was admitted on 01/01/2017. Fall attributed to progressive weakness. Creatinine also rising with hyperkalemia, despite volume overloaded. Heart Failure team concrened that he is failing home milrinone and he is not a candidate for advanced therapies. Palliative consulted to assist in clarifying goals of care  Clinical Assessment and Goals of Care: I met with Jason Choi at his bedside, his sister and home care provider joined [place] midway through the conversation.   Jason Choi had a good understanding of his health. He was able to detail his steady decline since March of this year, with increased symptoms, and a rotation between the hospital, home, and rehab. He also understands the Milrinone was a last ditch effort to help control his symptoms, and it does not seem to be working. At this point he understands his heart is failing, which is lending to his increasing symptoms, and it cannot be improved. He also understands his kidney function has worsened, which is also related to his heart failure.   We then started to talk about the plan for what comes next. I emphasized the need to consider where he wants to spend his time. He was very clear that he wanted to be at home (adamantly refused SNF/Rehab), and I reinforced that would necessitate 24hr care at home. This would be private  duty pay. I also posed the question: would you want to return to the hospital, or would you want your symptoms managed at home. He struggled with this question and wanted time to think on it. I discussed the possibility of staying at home with the plan to aggressively manage his symptoms there, which would involve Hospice. I reviewed their care philosophy, the changes to his medication that would occur (namely stopping Milrinone), and the type of services they provide. I also clarified that Hospice helps people stay comfortable at the end of their life. Jason Choi wanted to learn more about their services and was amenable to having a hospital liaison speak with him.   Finally, I did address code status. He had spoken with Junie Panning from the HF team already. He confirmed his desire for a DNR status. I clarified this would mean no CPR, defibrillation, or intubation in the event his heart or lungs stopped. He agreed. He was also agreeable to turning off his ICD. Additionally, I did ask about surrogate decision makers in the event he could not communicate his wishes. He wants his sister and daughter to cooperatively make decisions.   At this point Jason Choi is processing his decline and starting to think about next steps, which may include utilizing Hospice services at home. He plans to gather more information today and think on it overnight. He asked for a larger family meeting tomorrow to discuss everything again. In attendance will be his daughter, sister, care provider, and his friend Jenny Reichmann. I've also invited the Heart Failure team to be present in case clinical questions arise.    Primary Decision  Maker PATIENT   SUMMARY OF RECOMMENDATIONS    DNR, plan for family meeting 8/8 at 0800 for follow-up discussion on next steps   CM consult placed, asked for Hospice liaison to meet with pt today to discuss services  Wound consult placed given right heel wound with dressing due for change today  Code  Status/Advance Care Planning:  DNR  Symptom Management:   Pt very lethargic and weak, mildly short of breath at rest but declines medication to help. PRN pain medication noted in chart for post fall rib pain.   Palliative Prophylaxis:   Bowel Regimen, Frequent Pain Assessment and Palliative Wound Care  Additional Recommendations (Limitations, Scope, Preferences):  DNR, in discussion about considering transition to full comfort measures. Family meeting on 8/8 to discuss further.   Psycho-social/Spiritual:   Desire for further Chaplaincy support:no  Additional Recommendations: Education on Hospice  Prognosis:   < 6 weeks in the setting of CHF and AKI. Will trend kidney function. If it continues to worsen his time will be much shorter.  Discharge Planning: To Be Determined      Primary Diagnoses: Present on Admission: . AKI (acute kidney injury) ([place]) . Chronic systolic CHF (congestive heart failure) ([place]) . Gout . HTN (hypertension) . Hypercholesteremia . Hyponatremia   I have reviewed the medical record, interviewed the patient and family, and examined the patient. The following aspects are pertinent.  Past Medical History:  Diagnosis Date  . AICD (automatic cardioverter/defibrillator) present   . Anemia    Referral to GI Feb 2013  . Arthritis    "hands primarily" (10/12/2016)  . Atrial fibrillation ([place])    ON AMIODARONE: PER VISIT NOTE IN [facility]  . CHF (congestive heart failure) ([place])   . Chronic anticoagulation    on coumadin  . Chronic kidney disease (CKD), stage II (mild)   . Gout   . High risk medication use    on amiodarone  . HTN (hypertension)   . Hypercholesteremia   . LV dysfunction    EF 35 to 40% per echo May 2012; EF remains 35 to 40% per echo Feb 2013. Referred for ICD  . Myocardial infarction ([place])   . Osteoarthritis of right shoulder region    ACUTE PAIN  . 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 Pulmonary hypertension,  moderate to severe [facility])    Social History   Social History  . Marital status: Widowed    Spouse name: N/A  . Number of children: 1  . Years of education: N/A   Occupational History  . 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  [employer]   Social History Main Topics  . Smoking status: Former Smoker    Packs/day: 2.00    Years: 20.00    Types: Cigarettes  . Smokeless tobacco: Never Used     Comment: 10/12/2016 "haven't had a cigarette since 1980"  . Alcohol use 7.2 oz/week    4 Shots of liquor, 8 Cans of beer per week     Comment: 10/12/2016 "couple beers and a shot of [place] most  3-4 times/week"  . Drug use: No  . Sexual activity: No   Other Topics Concern  . None   Social History Narrative   Pt lives in [place] with spouse (who is a Systems analyst).  1 grown healthy daughter who is a Insurance claims handler.       Retired 2003 from [employer].  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   Family History  Problem Relation Age of Onset  . Valvular heart disease Father   . Colon cancer Neg Hx   . Colon polyps Neg Hx   . Rectal cancer Neg Hx   . Stomach cancer Neg Hx    Scheduled Meds: . allopurinol  100 mg Oral Daily  . amiodarone  200 mg Oral Daily  . atorvastatin  40 mg Oral QPM  . feeding supplement (ENSURE ENLIVE)  237 mL Oral TID BM  . warfarin  7.5 mg Oral ONCE-1800  . Warfarin - Pharmacist Dosing Inpatient   Does not apply q1800   Continuous Infusions: . milrinone     PRN Meds:.acetaminophen, fentaNYL (SUBLIMAZE) injection, oxyCODONE, promethazine Allergies  Allergen Reactions  . Lidocaine Other (See Comments)    Passed out - at the dentist's office  . Procaine Hcl Other (See Comments)    Passed out - at the dentist's office   Review of Systems  Constitutional: Positive for activity change, appetite change and fatigue.  HENT: Negative for congestion, facial swelling, hearing loss, sinus pressure, sore  throat and trouble swallowing.   Eyes: Negative for visual disturbance.  Respiratory: Positive for cough and shortness of breath.   Cardiovascular: Negative for chest pain and leg swelling.  Gastrointestinal: Positive for constipation, diarrhea and vomiting. Negative for abdominal distention and abdominal pain.  Genitourinary: Positive for decreased urine volume and urgency.  Musculoskeletal: Positive for back pain and gait problem.  Skin: Positive for pallor.  Neurological: Positive for weakness. Negative for dizziness, speech difficulty and light-headedness.  Psychiatric/Behavioral: Negative for confusion. The patient is nervous/anxious.    Physical Exam  Constitutional: He is oriented to person, place, and time. No distress.  HENT:  Head: Normocephalic and atraumatic.  Mouth/Throat: Oropharynx is clear and moist. Abnormal dentition. No oropharyngeal exudate.  Eyes: EOM are normal.  Neck: Normal range of motion. Neck supple.  Cardiovascular: Normal rate.  An irregularly irregular rhythm present.  Pulmonary/Chest:  Mild SOB at rest, but able to hold a conversation. Poor air movement throughout both lungs. Crackles in bilateral bases.   Abdominal: Bowel sounds are increased.  Musculoskeletal: Normal range of motion.  Generalized weakness, poor exercise tolerance. Right foot deformity  Neurological: He is alert and oriented to person, place, and time.  Skin: Skin is warm and dry. There is pallor.  Right heel wound, in dressing. Right toes bruised/discolored  Psychiatric: He has a normal mood and affect. His behavior is normal. Judgment and thought content normal.   Vital Signs: BP 93/66 (BP Location: Left Arm)   Pulse 92   Temp 97.7 F (36.5 C) (Oral)   Resp 18   Ht 6' 2" (1.88 m)   Wt 76.2 kg (167 lb 14.4 oz)   SpO2 96%   BMI 21.56 kg/m  Pain Assessment: No/denies pain   Pain Score: 4    SpO2: SpO2: 96 % O2 Device:SpO2: 96 % O2 Flow Rate: .O2 Flow Rate (L/min): 3  L/min  IO: Intake/output summary:  Intake/Output Summary (Last 24 hours) at 01/02/17 0743 Last data filed at 01/02/17 0544  Gross per 24 hour  Intake           503.33 ml  Output               50 ml  Net           453.33 ml    LBM: Last BM Date: 01/01/17  Baseline Weight: Weight: 72.6 kg (160 lb) Most recent weight: Weight: 76.2 kg (167 lb 14.4 oz)     Palliative Assessment/Data:  PPS 40%    Time Total: 70 minutes Greater than 50%  of this time was spent counseling and coordinating care related to the above assessment and plan.  Signed by: Charlynn Court, NP Palliative Medicine Team Pager # [phone] (M-F 7a-5p) Team Phone # [phone] (Nights/Weekends)

## 2017-01-02 NOTE — Progress Notes (Signed)
Nutrition Brief Note  Chart reviewed. Pt is being followed by palliative care team. Per consult note, pt is now DNR and in discussion about considering transition to full comfort measures. Family meeting has been scheduled for 01/03/17 and hospice liaison to meet with pt to discuss options.  Ensure was ordered for pt, per his request. No further nutrition interventions warranted at this time.  Please re-consult as needed.   Noriah Osgood A. Jimmye Norman, RD, LDN, CDE Pager: [phone] After hours Pager: [phone]

## 2017-01-02 NOTE — Progress Notes (Signed)
Patient ID: ISAISH ALEMU, male   DOB: 07-30-1940, 76 y.o.   MRN: [id number]     Advanced Heart Failure Rounding Note  Primary Cardiologist: [name]  Subjective:    Patient admitted 8/6 from home with fall and profound weakness, on home milrinone.  He was found to have AKI and hyperkalemia.   Overnight, minimal urine output recorded.  BUN/creatinine up to 103/4.24.   Objective:   Weight Range: 167 lb 14.4 oz (76.2 kg) Body mass index is 21.56 kg/m.   Vital Signs:   Temp:  [97.4 F (36.3 C)-97.7 F (36.5 C)] 97.7 F (36.5 C) (08/07 0610) Pulse Rate:  [73-130] 92 (08/07 0610) Resp:  [16-25] 18 (08/07 0610) BP: (75-115)/(60-89) 93/66 (08/07 0610) SpO2:  [88 %-100 %] 96 % (08/07 0610) Weight:  [167 lb 14.4 oz (76.2 kg)] 167 lb 14.4 oz (76.2 kg) (08/07 0609) Last BM Date: 01/01/17  Weight change: Filed Weights   01/01/17 0447 01/02/17 0609  Weight: 160 lb (72.6 kg) 167 lb 14.4 oz (76.2 kg)    Intake/Output:   Intake/Output Summary (Last 24 hours) at 01/02/17 0749 Last data filed at 01/02/17 0544  Gross per 24 hour  Intake           503.33 ml  Output               50 ml  Net           453.33 ml      Physical Exam    General:  Thin, chronically ill-appearing HEENT: Normal Neck: Supple. [id number].  No lymphadenopathy or thyromegaly appreciated. Cor: PMI lateral. Irregular rate & rhythm. +S3, no murmur Lungs: Decreased breath sounds at bases.  Abdomen: Soft, nontender, nondistended. No hepatosplenomegaly. No bruits or masses. Good bowel sounds. Extremities: No cyanosis, clubbing, rash, edema Neuro: Alert & orientedx3, cranial nerves grossly intact. moves all 4 extremities w/o difficulty. Affect pleasant   Telemetry   Atrial fibrillation  Labs    CBC  Recent Labs  01/01/17 0529 01/02/17 0433  WBC 8.8 9.6  NEUTROABS 7.0  --   HGB 8.9* 9.0*  HCT 28.8* 28.9*  MCV 88.3 87.3  PLT 326 235   Basic Metabolic Panel  Recent Labs  01/01/17 2254  01/02/17 0433  NA 129* 131*  K 6.0* 5.8*  CL 90* 90*  CO2 26 26  GLUCOSE 130* 117*  BUN 100* 103*  CREATININE 4.11* 4.24*  CALCIUM 9.3 9.1   Liver Function Tests  Recent Labs  01/01/17 0529  AST 48*  ALT 26  ALKPHOS 87  BILITOT 1.5*  PROT 6.2*  ALBUMIN 3.3*   No results for input(s): LIPASE, AMYLASE in the last 72 hours. Cardiac Enzymes No results for input(s): CKTOTAL, CKMB, CKMBINDEX, TROPONINI in the last 72 hours.  BNP: BNP (last 3 results)  Recent Labs  12/04/16 1128 12/14/16 1436 12/21/16 1154  BNP 1,974.7* 1,024.0* 3,403.3*    ProBNP (last 3 results) No results for input(s): PROBNP in the last 8760 hours.   D-Dimer No results for input(s): DDIMER in the last 72 hours. Hemoglobin A1C No results for input(s): HGBA1C in the last 72 hours. Fasting Lipid Panel No results for input(s): CHOL, HDL, LDLCALC, TRIG, CHOLHDL, LDLDIRECT in the last 72 hours. Thyroid Function Tests No results for input(s): TSH, T4TOTAL, T3FREE, THYROIDAB in the last 72 hours.  Invalid input(s): FREET3  Other results:   Imaging     No results found.   Medications:     Scheduled Medications: .  allopurinol  100 mg Oral Daily  . amiodarone  200 mg Oral Daily  . atorvastatin  40 mg Oral QPM  . feeding supplement (ENSURE ENLIVE)  237 mL Oral TID BM  . sodium polystyrene  30 g Oral Once  . warfarin  7.5 mg Oral ONCE-1800  . Warfarin - Pharmacist Dosing Inpatient   Does not apply q1800     Infusions: . furosemide    . milrinone       PRN Medications:  acetaminophen, fentaNYL (SUBLIMAZE) injection, oxyCODONE, promethazine    Patient Profile   Mr. Diodato is a 76 year old male with a past medical history of chronic systolic CHF due to ischemic cardiomyopathy (last echo July 2018 with EF 15%), status post Medtronic ICD, permanent atrial fibrillation. Admitted with fall on 01/01/17.   Assessment/Plan   1. Fall at home: No syncope, resulted from deconditioning.   2. 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 Echo (07/28/16) LVEF 15%, severe LV dilation, severe MR, mild RV dysfunction, Mod RAE, PA peak pressure 49 mm Hg. End stage, palliative care to see again this admission. He has been on home milrinone gtt but is failing this.  He was admitted with AKI and profound weakness.  On exam, he is not dry (JVP quite elevated), suspect PCWP is high.    - Continue milrinone gtt for now, will send co-ox now.  - Minimal UOP with AKI.  Will challenge with Lasix 120 mg IV x 1 (suspect CVP and PCWP remain high).  - He does not have good options for medical therapy at this point.  Not candidate for LVAD and would tolerate HD poorly.  I told him this morning that I do not see a good way to go forward.  I would like palliative care to see him, hospice care would be appropriate at this point.  3. CAD: S/p CABG in 2002: No chest pain - Continue atorvastatin.  4. CKD: Stage III-IV: Baseline 2.0 - 2.2.  Admitted with AKI, hyperkalemia.  BUN/creatinine up to 102/4.24 and K still 5.8 this morning. Suspect progressive cardiorenal syndrome.  He is not dry on exam.  - As above, not HD candidate.  - Will give dose of Kayexalate this morning.  - Has not had ECG, will order.  5. Permanent Afib: Rate controlled. On Amio for frequent PVC's last admission. Continue for now.  6. Hyperkalemia: K still 5.8.  Needs dose Kayexalate and repeat BMET noon.  7. Frequent PVCs with occasional NSVT - Improved. Continue Amio.   8. Goals of care: Needs palliative care evaluation.  End stage CHF with AKI.  Comfort care would be appropriate.   Length of Stay: 0  Loralie Champagne, MD  01/02/2017, 7:49 AM  Advanced Heart Failure Team Pager [phone] (M-F; 7a - 4p)  Please contact [facility] for night-coverage after hours (4p -7a ) and weekends on [email or web address]

## 2017-01-02 NOTE — Progress Notes (Signed)
Patient received from ED via bed. Vital signs are stable. Patient is alert and oriented. Patient is on milrinone drip from his home continuous. Patient is on telemetry.patient given instruction about call light and phone. Bed in low position and side rail up x2.

## 2017-01-02 NOTE — Progress Notes (Signed)
Medtronic rep turned off ICD. Cont to monitor. Carroll Kinds RN

## 2017-01-03 DIAGNOSIS — E871 Hypo-osmolality and hyponatremia: Secondary | ICD-10-CM

## 2017-01-03 DIAGNOSIS — I5043 Acute on chronic combined systolic (congestive) and diastolic (congestive) heart failure: Secondary | ICD-10-CM

## 2017-01-03 DIAGNOSIS — N179 Acute kidney failure, unspecified: Principal | ICD-10-CM

## 2017-01-03 DIAGNOSIS — Z515 Encounter for palliative care: Secondary | ICD-10-CM

## 2017-01-03 DIAGNOSIS — R531 Weakness: Secondary | ICD-10-CM

## 2017-01-03 DIAGNOSIS — K767 Hepatorenal syndrome: Secondary | ICD-10-CM

## 2017-01-03 LAB — MAGNESIUM: MAGNESIUM: 2.7 mg/dL — AB (ref 1.7–2.4)

## 2017-01-03 LAB — CBC WITH DIFFERENTIAL/PLATELET
Basophils Absolute: 0 10*3/uL (ref 0.0–0.1)
Basophils Relative: 0 %
Eosinophils Absolute: 0.3 10*3/uL (ref 0.0–0.7)
Eosinophils Relative: 3 %
HEMATOCRIT: 25.4 % — AB (ref 39.0–52.0)
HEMOGLOBIN: 8 g/dL — AB (ref 13.0–17.0)
LYMPHS ABS: 1 10*3/uL (ref 0.7–4.0)
LYMPHS PCT: 11 %
MCH: 27.3 pg (ref 26.0–34.0)
MCHC: 31.5 g/dL (ref 30.0–36.0)
MCV: 86.7 fL (ref 78.0–100.0)
MONO ABS: 1.1 10*3/uL — AB (ref 0.1–1.0)
MONOS PCT: 12 %
NEUTROS ABS: 6.8 10*3/uL (ref 1.7–7.7)
NEUTROS PCT: 74 %
Platelets: 317 10*3/uL (ref 150–400)
RBC: 2.93 MIL/uL — ABNORMAL LOW (ref 4.22–5.81)
RDW: 19 % — AB (ref 11.5–15.5)
WBC: 9.2 10*3/uL (ref 4.0–10.5)

## 2017-01-03 LAB — COMPREHENSIVE METABOLIC PANEL
ALBUMIN: 3.1 g/dL — AB (ref 3.5–5.0)
ALK PHOS: 83 U/L (ref 38–126)
ALT: 42 U/L (ref 17–63)
ANION GAP: 13 (ref 5–15)
AST: 45 U/L — AB (ref 15–41)
BILIRUBIN TOTAL: 1 mg/dL (ref 0.3–1.2)
BUN: 106 mg/dL — AB (ref 6–20)
CALCIUM: 8.8 mg/dL — AB (ref 8.9–10.3)
CO2: 27 mmol/L (ref 22–32)
Chloride: 86 mmol/L — ABNORMAL LOW (ref 101–111)
Creatinine, Ser: 4.57 mg/dL — ABNORMAL HIGH (ref 0.61–1.24)
GFR calc Af Amer: 13 mL/min — ABNORMAL LOW (ref >60)
GFR, EST NON AFRICAN AMERICAN: 11 mL/min — AB (ref >60)
GLUCOSE: 124 mg/dL — AB (ref 65–99)
POTASSIUM: 5.5 mmol/L — AB (ref 3.5–5.1)
Sodium: 126 mmol/L — ABNORMAL LOW (ref 135–145)
TOTAL PROTEIN: 5.9 g/dL — AB (ref 6.5–8.1)

## 2017-01-03 LAB — COOXEMETRY PANEL
Carboxyhemoglobin: 1.7 % — ABNORMAL HIGH (ref 0.5–1.5)
Methemoglobin: 0.8 % (ref 0.0–1.5)
O2 Saturation: 44 %
Total hemoglobin: 9.2 g/dL — ABNORMAL LOW (ref 12.0–16.0)

## 2017-01-03 LAB — PROTIME-INR
INR: 2.01
PROTHROMBIN TIME: 23.1 s — AB (ref 11.4–15.2)

## 2017-01-03 LAB — PHOSPHORUS: Phosphorus: 6 mg/dL — ABNORMAL HIGH (ref 2.5–4.6)

## 2017-01-03 MED ORDER — TORSEMIDE 20 MG PO TABS
80.0000 mg | ORAL_TABLET | Freq: Every day | ORAL | Status: DC
  Administered 2017-01-03 – 2017-01-04 (×2): 80 mg via ORAL
  Filled 2017-01-03 (×2): qty 4

## 2017-01-03 NOTE — Progress Notes (Addendum)
Daily Progress Note   Patient Name: Jason Choi       Date: 01/03/2017 DOB: 1940/08/16  Age: 76 y.o. MRN#: [id number] Attending Physician: Geradine Girt, DO Primary Care Physician: Merwyn Katos Admit Date: 01/01/2017  Reason for Consultation/Follow-up: Disposition, Establishing goals of care, Hospice Evaluation and Psychosocial/spiritual support  Subjective: [name] is more withdrawn today. His family and close friend have arrived for the family meeting, which he is appreciative of. He feels unchanged from yesterday with ongoing fatigue and weakness, but no acute complaints.   Length of Stay: 1  Current Medications: Scheduled Meds:  . allopurinol  100 mg Oral Daily  . amiodarone  200 mg Oral Daily  . atorvastatin  40 mg Oral QPM  . feeding supplement (ENSURE ENLIVE)  237 mL Oral TID BM    Continuous Infusions: . milrinone 0.375 mcg/kg/min (01/02/17 2223)    PRN Meds: acetaminophen, fentaNYL (SUBLIMAZE) injection, oxyCODONE, promethazine  Physical Exam       Constitutional: He is oriented to person, place, and time. No distress.  HENT:  Head: Normocephalic and atraumatic.  Mouth/Throat: Oropharynx is clear and moist. Abnormal dentition. No oropharyngeal exudate.  Eyes: EOM are normal.  Neck: Normal range of motion. Neck supple.  Cardiovascular: Normal rate.  An irregularly irregular rhythm present.  Pulmonary/Chest:  Mild SOB at rest, but able to hold a conversation. Poor air movement throughout both lungs. Crackles in bilateral bases.   Abdominal: Bowel sounds are increased.  Musculoskeletal: Normal range of motion.  Generalized weakness, poor exercise tolerance. Right foot deformity  Neurological: He is alert and oriented to person, place, and time.  Skin: Skin is warm and dry. There is pallor.    Right heel wound, in dressing. Right toes bruised/discolored  Psychiatric: He has a normal mood and affect. His behavior is normal. Judgment and thought content normal.     Vital Signs: BP 92/61 (BP Location: Left Arm)   Pulse 93   Temp (!) 97.4 F (36.3 C) (Axillary)   Resp (!) 21   Ht 6\' 2"  (1.88 m)   Wt 76.2 kg (167 lb 14.4 oz)   SpO2 100%   BMI 21.56 kg/m  SpO2: SpO2: 100 % O2 Device: O2 Device: Nasal Cannula O2 Flow Rate: O2 Flow Rate (L/min): 3 L/min  Intake/output summary:  Intake/Output Summary (Last 24 hours) at 01/03/17 0840 Last data filed at 01/03/17 0458  Gross per 24 hour  Intake           627.79 ml  Output              275 ml  Net           352.79 ml   LBM: Last BM Date: 01/01/17 Baseline Weight: Weight: 72.6 kg (160 lb) Most recent weight: Weight: 76.2 kg (167 lb 14.4 oz)  Palliative Assessment/Data: PPS 40%    Patient Active Problem List   Diagnosis Date Noted  . Goals of care, counseling/discussion   . AKI (acute kidney injury) ([place]) 01/01/2017  . Hyperkalemia 01/01/2017  . Anemia due to chronic kidney disease 01/01/2017  . Fall   . Generalized weakness   . Palliative care by specialist   .  DNR (do not resuscitate) discussion   . Acute Choi chronic systolic CHF (congestive heart failure) ([place]) 12/21/2016  . Symptomatic bradycardia 12/04/2016  . Acute Choi chronic systolic and diastolic heart failure, NYHA class 3 ([place]) 12/04/2016  . CHF (congestive heart failure) ([place]) 10/12/2016  . Gout 08/16/2016  . Syncope 07/26/2016  . Encounter for therapeutic drug monitoring 07/08/2013  . Hyponatremia 01/03/2012  . CKD (chronic kidney disease) stage 3, GFR 30-59 ml/min   . Anemia   . CAD (coronary artery disease)   . HTN (hypertension)   . Hypercholesteremia   . Chronic anticoagulation   . Chronic systolic CHF (congestive heart failure) ([place])   . Permanent atrial fibrillation ([place]) 08/22/2010    Palliative Care Assessment & Plan   HPI: 76 y.o.  male  with past medical history of end stage biventricular sCHF due to ischemic cardiomyopathy (EF 15%), s/p Medtronic ICD, permanent atrial fibrillation, and CAD. He was started Choi Milrinone during the last admission (7/26-8/2). He now presents after a fall at home. He was admitted Choi 01/01/2017. Fall attributed to progressive weakness. Creatinine also rising with hyperkalemia, despite volume overloaded. Heart Failure team concrened that he is failing home milrinone and he is not a candidate for advanced therapies. Palliative consulted to assist in clarifying goals of care  Assessment: I had a follow-up family meeting with [name], his daughter, son-in-law, sister, close friend, and caregiver. [name] with Heart Failure was present for the first part of the meeting and helped clarify his clinical state. We talked through his underlying medical condition, as well as his progressive decline with increased symptom burden. [name] shared that he had thought more about our conversation yesterday, and he would like to be at home with the plan to remain there for symptom management. He agrees with stopping Milrinone, and wants to focus efforts Choi keeping him comfortable and working to improve his quality of life for as long as possible. He is accepting of Hospice support to achieve this.  I shared with his family the necessity of 24 hour support at home. His family will coordinate this with a combination of family and friend support, augmented by private duty (paid for out of pocket). We talked through the services Hospice can provide, as well as their philosophy of care and how they use medications to treat symptoms (not the underlying disease). All were in agreement to proceed with Hospice support at home, with the request that discharge from the hospital occur Choi 8/9 so they have time to prepare the home for his arrival.   Recommendations/Plan:  DNR, ICD has been turned off (per RN note 8/7  8:12PM)  Plan for transition home with Hospice; CM consulted to facilitate Hospice referral, HF team to turn off Milrinone  I would leave in PICC as Hospice can use it at home for IV medication if needed   Goals of Care and Additional Recommendations:  Limitations Choi Scope of Treatment: Transitioning to full comfort care with discharge home with Hospice  Code Status:  DNR  Prognosis:   Weeks in the setting of CHF with progressive cardiorenal syndrome and electrolyte abnormalities  Discharge Planning:  Home with Hospice  Care plan was discussed with pt, family, HF team, primary team notified  Thank you for allowing the Palliative Medicine Team to assist in the care of this patient.  Total time: 35 minutes    Greater than 50%  of this time was spent counseling and coordinating care related to  the above assessment and plan.  Charlynn Court, NP Palliative Medicine Team [phone] pager (7a-5p) Team Phone # [phone]

## 2017-01-03 NOTE — Progress Notes (Signed)
[facility])  Received request from [facility] for family interest in [facility] services at home after discharge. Per Edwin Cap plan is for discharge home 01/04/17. Chart reviewed and clinical information under review to determine eligibility.   Met with patient and family at bedside to explain services and answer questions. They are familiar with hospice services and verbalized good understanding of information provided. Per patient, plan is to transfer home 01/04/17 via [place].   DME needs discussed. Patient requesting Hospital Bed with split rails and over bed table to be delivered to address in EPIC chart. DME ordered through [facility]. Patient's daughter Mel is designated contact to coordinate DME delivery. Patient already has oxygen, walker, BSC, shower bench and wheel chair at home.   [facility] is aware of above and will schedule admission visit in the home. RNCM A[place] aware of above.   Please send signed and completed out of facility home with patient. Please send scripts for any medication patient does not already have including comfort medications.  Please notify [facility] when patient is ready to leave the unit at [phone] or [phone] after 5PM.  Please call with Hospice related questions.   [facility] hospital liaison will follow up 01/04/17.  Thank you,  Erling Conte, LCSW [phone]

## 2017-01-03 NOTE — Progress Notes (Signed)
Patient ID: Jason Choi, male   DOB: July 23, 1940, 76 y.o.   MRN: [id number]     Advanced Heart Failure Rounding Note  Primary Cardiologist: [name]  Subjective:    Patient admitted 8/6 from home with fall and profound weakness, on home milrinone.  He was found to have AKI and hyperkalemia.   Minimal urine overnight despite high dose lasix. Palliative to meet with family this am.   Objective:   Weight Range: 167 lb 14.4 oz (76.2 kg) Body mass index is 21.56 kg/m.   Vital Signs:   Temp:  [97.5 F (36.4 C)-97.8 F (36.6 C)] 97.8 F (36.6 C) (08/08 0454) Pulse Rate:  [90-107] 93 (08/08 0454) Resp:  [18-19] 18 (08/08 0454) BP: (92-96)/(61-68) 92/61 (08/08 0454) SpO2:  [96 %-100 %] 100 % (08/08 0454) FiO2 (%):  [3 %] 3 % (08/07 1750) Last BM Date: 01/01/17  Weight change: Filed Weights   01/01/17 0447 01/02/17 0609  Weight: 160 lb (72.6 kg) 167 lb 14.4 oz (76.2 kg)    Intake/Output:   Intake/Output Summary (Last 24 hours) at 01/03/17 0729 Last data filed at 01/03/17 0458  Gross per 24 hour  Intake           627.79 ml  Output              275 ml  Net           352.79 ml      Physical Exam    General:  Thin male, ill appearing.  HEENT: Normal Neck: Supple. JVP to jaw.  No lymphadenopathy or thyromegaly appreciated. Cor: PMI laterally displaced. Regular rate and rhythm.  +S3, no murmur Lungs: Diminished in all lobes, Slightly SOB with talking.  Abdomen: Soft, nontender, nondistended. No hepatosplenomegaly. No bruits or masses. Good bowel sounds. Extremities: No cyanosis, clubbing, rash. No peripheral edema.  Neuro: Alert & orientedx3, cranial nerves grossly intact. moves all 4 extremities w/o difficulty. Affect pleasant   Telemetry   Afib - personally reviewed.   Labs    CBC  Recent Labs  01/01/17 0529 01/02/17 0433 01/03/17 0500  WBC 8.8 9.6 9.2  NEUTROABS 7.0  --  6.8  HGB 8.9* 9.0* 8.0*  HCT 28.8* 28.9* 25.4*  MCV 88.3 87.3 86.7  PLT 326 321  924   Basic Metabolic Panel  Recent Labs  01/02/17 1110 01/03/17 0500  NA 127* 126*  K 6.0* 5.5*  CL 88* 86*  CO2 24 27  GLUCOSE 142* 124*  BUN 105* 106*  CREATININE 4.36* 4.57*  CALCIUM 9.0 8.8*  MG  --  2.7*  PHOS  --  6.0*   Liver Function Tests  Recent Labs  01/01/17 0529 01/03/17 0500  AST 48* 45*  ALT 26 42  ALKPHOS 87 83  BILITOT 1.5* 1.0  PROT 6.2* 5.9*  ALBUMIN 3.3* 3.1*   No results for input(s): LIPASE, AMYLASE in the last 72 hours. Cardiac Enzymes No results for input(s): CKTOTAL, CKMB, CKMBINDEX, TROPONINI in the last 72 hours.  BNP: BNP (last 3 results)  Recent Labs  12/04/16 1128 12/14/16 1436 12/21/16 1154  BNP 1,974.7* 1,024.0* 3,403.3*    ProBNP (last 3 results) No results for input(s): PROBNP in the last 8760 hours.   D-Dimer No results for input(s): DDIMER in the last 72 hours. Hemoglobin A1C No results for input(s): HGBA1C in the last 72 hours. Fasting Lipid Panel No results for input(s): CHOL, HDL, LDLCALC, TRIG, CHOLHDL, LDLDIRECT in the last 72 hours. Thyroid Function Tests  No results for input(s): TSH, T4TOTAL, T3FREE, THYROIDAB in the last 72 hours.  Invalid input(s): FREET3  Other results:   Imaging    No results found.   Medications:     Scheduled Medications: . allopurinol  100 mg Oral Daily  . amiodarone  200 mg Oral Daily  . atorvastatin  40 mg Oral QPM  . feeding supplement (ENSURE ENLIVE)  237 mL Oral TID BM  . Warfarin - Pharmacist Dosing Inpatient   Does not apply q1800    Infusions: . milrinone 0.375 mcg/kg/min (01/02/17 2223)    PRN Medications: acetaminophen, fentaNYL (SUBLIMAZE) injection, oxyCODONE, promethazine    Patient Profile   Jason Choi is a 76 year old male with a past medical history of chronic systolic CHF due to ischemic cardiomyopathy (last echo July 2018 with EF 15%), status post Medtronic ICD, permanent atrial fibrillation. Admitted with fall on 01/01/17.    Assessment/Plan   1. Fall at home: No syncope, resulted from deconditioning.  - No change.   2. 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 Echo (07/28/16) LVEF 15%, severe LV dilation, severe MR, mild RV dysfunction, Mod RAE, PA peak pressure 49 mm Hg. End stage. He has been on home milrinone gtt but is failing this.  He was admitted with AKI and profound weakness.   - Palliative care and comfort care appropriate. Will stop milrinone today after family meeting.  - He does not have good options for medical therapy at this point.  Not candidate for LVAD and would tolerate HD poorly.   3. CAD: S/p CABG in 2002: No chest pain - Can stop atorvastatin.   4. CKD: Stage III-IV: Baseline 2.0 - 2.2.  Admitted with AKI, hyperkalemia.   - Creatinine up to 4.57 today.  - As above, not HD candidate.   5. Permanent Afib: Rate controlled.  - No change.   6. Hyperkalemia:  - K 5.5. In renal failure.   7. Frequent PVCs with occasional NSVT - Will stop Amio and all po meds for comfort after meeting.     8. Goals of care: - Pt. Is a DNR.   Length of Stay: [place], NP  01/03/2017, 7:29 AM  Advanced Heart Failure Team Pager [phone] (M-F; 7a - 4p)  Please contact [facility] for night-coverage after hours (4p -7a ) and weekends on [email or web address]  Patient seen with NP, agree with the above note.  He has failed milrinone and not HD candidate.  Renal function worsening.  Family meeting this morning, patient is now DNR and will be going home with hospice care.  Milrinone and ICD off.  He can continue torsemide 80 mg daily.    Loralie Champagne 01/03/2017

## 2017-01-03 NOTE — Progress Notes (Signed)
PROGRESS NOTE    Jason Choi  DJM:[id number] DOB: 1940-11-13 DOA: 01/01/2017 PCP: Heywood Bene, PA-C   Brief Narrative:  Jason Choi is a 76 y.o. male with medical history significant of severe pulmonary hypertension, MI/CAD, CKD, systolic congestive heart failure, atrophic fibrillation status post AICD placement, hypertension, hyperlipidemia.  Admitted and felt his he has progressive Cardiorenal Syndrome poor overall prognosis. Palliative Care medicine also consulted and family discussion on 8/8 where he ultimately became comfort care.  Assessment & Plan:   Active Problems:   HTN (hypertension)   Hypercholesteremia   Chronic systolic CHF (congestive heart failure) (HCC)   Hyponatremia   Gout   AKI (acute kidney injury) ([place])   Hyperkalemia   Anemia due to chronic kidney disease   Goals of care, counseling/discussion  Acute on Chronic Kidney Disease Stage 3-4, worsening -Baseline Cr is 2.1 -Not a HD Candidate -now comfort focus  Hyperkalemia:  -now comfort focused  Hyponatremia:  -comfort focus  Afib/CHF:  - per CHF team with a comfort focus  Fall with Rib Pain - Pain control with Fentanyl and Oxycodone; Acetaminophen 1000 mg po Daily   Gout: - comfort  Anemia:  - comfort  Right Heel Wound -WOC Nurse appreciated  DVT prophylaxis: Anticoagulated with Coumadin Code Status: DO NOT RESUSCITATE Family Communication: multiple Disposition Plan: home with hospice in AM  Consultants:   Cardiology  Palliative Care Medicine  Procedures:    Antimicrobials: Anti-infectives    None     Subjective: Asking for shot of crown.   Objective: Vitals:   01/02/17 1750 01/02/17 2006 01/03/17 0454 01/03/17 0700  BP: 95/68 96/63 92/61    Pulse: (!) 107 90 93   Resp: 19 18 18  (!) 21  Temp: (!) 97.5 F (36.4 C) 97.6 F (36.4 C) 97.8 F (36.6 C) (!) 97.4 F (36.3 C)  TempSrc: Oral Axillary Oral Axillary  SpO2: 96% 98% 100% 100%  Weight:       Height:        Intake/Output Summary (Last 24 hours) at 01/03/17 1245 Last data filed at 01/03/17 1000  Gross per 24 hour  Intake          1089.79 ml  Output              300 ml  Net           789.79 ml   Filed Weights   01/01/17 0447 01/02/17 0609  Weight: 72.6 kg (160 lb) 76.2 kg (167 lb 14.4 oz)   Examination: Physical Exam:  Constitutional: In bed, surrounded by family Skin: No rashes on a limited skin eval No induration; Warm and dry.  Neurologic: no focal deficits Psychiatric: calm  Data Reviewed: I have personally reviewed following labs and imaging studies  CBC:  Recent Labs Lab 01/01/17 0529 01/02/17 0433 01/03/17 0500  WBC 8.8 9.6 9.2  NEUTROABS 7.0  --  6.8  HGB 8.9* 9.0* 8.0*  HCT 28.8* 28.9* 25.4*  MCV 88.3 87.3 86.7  PLT 326 321 222   Basic Metabolic Panel:  Recent Labs Lab 01/01/17 0529 01/01/17 2254 01/02/17 0433 01/02/17 1110 01/03/17 0500  NA 129* 129* 131* 127* 126*  K 6.5* 6.0* 5.8* 6.0* 5.5*  CL 91* 90* 90* 88* 86*  CO2 25 26 26 24 27   GLUCOSE 106* 130* 117* 142* 124*  BUN 95* 100* 103* 105* 106*  CREATININE 3.79* 4.11* 4.24* 4.36* 4.57*  CALCIUM 9.3 9.3 9.1 9.0 8.8*  MG  --   --   --   --  2.7*  PHOS  --   --   --   --  6.0*   GFR: Estimated Creatinine Clearance: 15.3 mL/min (A) (by C-G formula based on SCr of 4.57 mg/dL (H)). Liver Function Tests:  Recent Labs Lab 01/01/17 0529 01/03/17 0500  AST 48* 45*  ALT 26 42  ALKPHOS 87 83  BILITOT 1.5* 1.0  PROT 6.2* 5.9*  ALBUMIN 3.3* 3.1*   No results for input(s): LIPASE, AMYLASE in the last 168 hours. No results for input(s): AMMONIA in the last 168 hours. Coagulation Profile:  Recent Labs Lab 01/01/17 0529 01/02/17 0433 01/03/17 0500  INR 1.70 1.89 2.01   Cardiac Enzymes: No results for input(s): CKTOTAL, CKMB, CKMBINDEX, TROPONINI in the last 168 hours. BNP (last 3 results) No results for input(s): PROBNP in the last 8760 hours. HbA1C: No results for  input(s): HGBA1C in the last 72 hours. CBG: No results for input(s): GLUCAP in the last 168 hours. Lipid Profile: No results for input(s): CHOL, HDL, LDLCALC, TRIG, CHOLHDL, LDLDIRECT in the last 72 hours. Thyroid Function Tests: No results for input(s): TSH, T4TOTAL, FREET4, T3FREE, THYROIDAB in the last 72 hours. Anemia Panel: No results for input(s): VITAMINB12, FOLATE, FERRITIN, TIBC, IRON, RETICCTPCT in the last 72 hours. Sepsis Labs: No results for input(s): PROCALCITON, LATICACIDVEN in the last 168 hours.  No results found for this or any previous visit (from the past 240 hour(s)).   Radiology Studies: No results found. Scheduled Meds: . allopurinol  100 mg Oral Daily  . feeding supplement (ENSURE ENLIVE)  237 mL Oral TID BM  . torsemide  80 mg Oral Daily   Continuous Infusions:    LOS: 1 day   [place], DO [facility]   If 7PM-7AM, please contact night-coverage [email or web address] Password [email or web address] 01/03/2017, 12:45 PM

## 2017-01-04 DIAGNOSIS — E875 Hyperkalemia: Secondary | ICD-10-CM

## 2017-01-04 DIAGNOSIS — Z7189 Other specified counseling: Secondary | ICD-10-CM

## 2017-01-04 LAB — PROTIME-INR
INR: 2.05
Prothrombin Time: 23.4 seconds — ABNORMAL HIGH (ref 11.4–15.2)

## 2017-01-04 MED ORDER — TORSEMIDE 20 MG PO TABS: 80.0000 mg | ORAL_TABLET | Freq: Every day | ORAL | 0 refills | Status: AC

## 2017-01-04 MED ORDER — ONDANSETRON 4 MG PO TBDP
4.0000 mg | ORAL_TABLET | Freq: Three times a day (TID) | ORAL | Status: DC | PRN
  Filled 2017-01-04: qty 1

## 2017-01-04 MED ORDER — ONDANSETRON 4 MG PO TBDP: 4.0000 mg | ORAL_TABLET | Freq: Three times a day (TID) | ORAL | 0 refills | Status: AC | PRN

## 2017-01-04 MED ORDER — OXYCODONE HCL 5 MG PO TABS: 5.0000 mg | ORAL_TABLET | ORAL | 0 refills | Status: AC | PRN

## 2017-01-04 MED ORDER — LORAZEPAM 2 MG/ML PO CONC: 0.6000 mg | Freq: Four times a day (QID) | ORAL | 0 refills | Status: AC | PRN

## 2017-01-04 MED ORDER — LORAZEPAM 2 MG/ML PO CONC: 0.5000 mg | Freq: Four times a day (QID) | ORAL | Status: DC | PRN

## 2017-01-04 NOTE — Progress Notes (Signed)
8E42 [place]: Pt discharged home, discharge instructions given and explained to patient and family. Pt and family verbalized understanding. Peripheral IV and telemetry removed per MD 's orders. PICC line flushed and remains in place per MD's order.No complaints per patient.  Ambulance to take patient home. Rosana Fret RN

## 2017-01-04 NOTE — Discharge Summary (Signed)
Physician Discharge Summary  Jason Choi Sellen GHW:[id number] DOB: [birth year] DOA: 01/01/2017  PCP: Heywood Bene, PA-C  Admit date: 01/01/2017 Discharge date: 01/04/2017   Recommendations for Outpatient Follow-Up:   1. Home with hospice   Discharge Diagnosis:   Active Problems:   HTN (hypertension)   Hypercholesteremia   Chronic systolic CHF (congestive heart failure) (HCC)   Hyponatremia   Gout   Acute on chronic systolic and diastolic heart failure, NYHA class 3 (HCC)   AKI (acute kidney injury) ([place])   Hyperkalemia   Anemia due to chronic kidney disease   Goals of care, counseling/discussion   Discharge disposition:  Home with hospice  Discharge Condition: terminal  Diet recommendation: as tolerated  Wound care: None.   History of Present Illness:   Jason Choi is a 76 y.o. male with medical history significant of severe pulmonary hypertension, MI/CAD, C KD, systolic congestive heart failure, atrophic fibrillation status post AICD placement, hypertension, hyperlipidemia.. Patient reporting after fall at home and his bathroom. Patient states he has been feeling more weak as of late but denies any lower extremity swelling, shortness of breath orthopnea, wheezing, fevers, nausea, vomiting. Patient does endorse several loose stools per day over the last several days since discharge but denies any hematochezia, melena or abdominal pain. Patient states that he went to the bathroom on the morning of admission and then 1 to ambulate out of the bathroom. Upon returning to grab his cane he lost his balance and fell to the ground striking his left arm and side patient also states that his head barely hit the door but did not lose consciousness denies headache or bleeding from his head. Patient states he normally uses a for walker to ambulate. Patient states he lives by himself and his medical alert button to call EMS to his home. Patient thinks she is on the ground for  approximately 30 minutes before EMS services were able to take his door off the hinges and get him out. Currently complaining of left rib and neck stiffness and pain. Patient states that prior to the hospital he had a good workout but reports eating and drinking very little over the last several days.   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, worsening -Baseline Cr is 2.1 -Not a HD Candidate -now comfort focus  Hyperkalemia:  -now comfort focused  Hyponatremia:  -comfort focus  Afib/CHF:  - per CHF team with a comfort focus  Fall with Rib Pain - Pain control  Gout: - comfort  Anemia:  - comfort  Right Heel Wound -WOC Nurse appreciated    Medical Consultants:    Hospice  cardiology   Discharge Exam:   Vitals:   01/04/17 0350 01/04/17 0753  BP: 94/64 93/63  Pulse: 85 95  Resp: 18 18  Temp: 97.8 F (36.6 C) (!) 97.5 F (36.4 C)   Vitals:   01/03/17 1716 01/03/17 2029 01/04/17 0350 01/04/17 0753  BP: 94/67 106/62 94/64 93/63   Pulse: 96 99 85 95  Resp: 18 18 18 18   Temp: 97.9 F (36.6 C) (!) 97.5 F (36.4 C) 97.8 F (36.6 C) (!) 97.5 F (36.4 C)  TempSrc: Oral Oral Axillary Axillary  SpO2: 99% 100% 98% 100%  Weight:   78.9 kg (174 lb)   Height:           The results of significant diagnostics from this hospitalization (including imaging, microbiology, ancillary and laboratory) are listed below for reference.  Procedures and Diagnostic Studies:   Ct Abdomen Pelvis Wo Contrast  Result Date: 01/01/2017 CLINICAL DATA:  Status post blunt trauma to the abdomen. Concern for chest or abdominal injury. Initial encounter. EXAM: CT CHEST, ABDOMEN AND PELVIS WITHOUT CONTRAST TECHNIQUE: Multidetector CT imaging of the chest, abdomen and pelvis was performed following the standard protocol without IV contrast. COMPARISON:  Abdominal radiograph performed 11/28/2016 FINDINGS: CT CHEST FINDINGS Cardiovascular: The heart  is mildly enlarged. Diffuse coronary artery calcifications are seen. The patient is status post median sternotomy. Scattered calcification is seen along the thoracic aorta. A right PICC is noted ending about the distal SVC. A pacemaker is noted at the left chest wall, with a single lead ending at the right ventricle. The great vessels demonstrate scattered calcification but are otherwise grossly unremarkable. Mediastinum/Nodes: Scattered enlarged right paratracheal nodes measure up to 1.4 cm in short axis. No pericardial effusion is identified. The thyroid gland is grossly unremarkable. No axillary lymphadenopathy is seen. Lungs/Pleura: A small right pleural effusion is noted. Diffuse interstitial prominence is somewhat chronic in appearance, extending to the periphery, raising concern for interstitial lung disease and pulmonary fibrosis. Would correlate for any associated symptoms. No pneumothorax is identified. No dominant mass is seen. Musculoskeletal: No acute osseous abnormalities are identified. The visualized musculature is unremarkable in appearance. CT ABDOMEN PELVIS FINDINGS Hepatobiliary: The liver is unremarkable in appearance. The gallbladder is unremarkable in appearance. The common bile duct remains normal in caliber. Pancreas: The pancreas is within normal limits. Spleen: Trace fluid is noted about the spleen. The spleen is otherwise unremarkable. Adrenals/Urinary Tract: The adrenal glands are unremarkable in appearance. The kidneys are within normal limits. There is no evidence of hydronephrosis. No renal or ureteral stones are identified. Nonspecific perinephric stranding is noted bilaterally. Stomach/Bowel: The stomach is unremarkable in appearance. The small bowel is within normal limits. The appendix is normal in caliber, without evidence of appendicitis. The colon is unremarkable in appearance. Trace fluid is seen tracking along the paracolic gutters bilaterally. Vascular/Lymphatic: Diffuse  calcification is seen along the abdominal aorta and its branches. The abdominal aorta is otherwise grossly unremarkable. The inferior vena cava is grossly unremarkable. No retroperitoneal lymphadenopathy is seen. No pelvic sidewall lymphadenopathy is identified. Reproductive: Apparent bladder wall thickening may reflect mild chronic inflammation or possibly cystitis. The prostate remains normal in size. Other: No additional soft tissue abnormalities are seen. Musculoskeletal: No acute osseous abnormalities are identified. The visualized musculature is unremarkable in appearance. IMPRESSION: 1. No evidence of traumatic injury to the chest, abdomen or pelvis. 2. Small right pleural effusion. Diffuse interstitial prominence is somewhat chronic in appearance, extending to the periphery, raising concern for interstitial lung disease and pulmonary fibrosis. Would correlate for any associated symptoms. 3. Apparent bladder wall thickening may reflect mild chronic inflammation or possibly cystitis. 4. Trace fluid noted along the paracolic gutters bilaterally. 5. Mild cardiomegaly.  Diffuse coronary artery calcifications seen. 6. Enlarged right paratracheal nodes measure up to 1.4 cm in short axis. 7. Diffuse aortic atherosclerosis noted. Electronically Signed   By: Garald Balding M.D.   On: 01/01/2017 06:27   Ct Chest Wo Contrast  Result Date: 01/01/2017 CLINICAL DATA:  Status post blunt trauma to the abdomen. Concern for chest or abdominal injury. Initial encounter. EXAM: CT CHEST, ABDOMEN AND PELVIS WITHOUT CONTRAST TECHNIQUE: Multidetector CT imaging of the chest, abdomen and pelvis was performed following the standard protocol without IV contrast. COMPARISON:  Abdominal radiograph performed 11/28/2016 FINDINGS: CT CHEST FINDINGS Cardiovascular: The heart is  mildly enlarged. Diffuse coronary artery calcifications are seen. The patient is status post median sternotomy. Scattered calcification is seen along the thoracic  aorta. A right PICC is noted ending about the distal SVC. A pacemaker is noted at the left chest wall, with a single lead ending at the right ventricle. The great vessels demonstrate scattered calcification but are otherwise grossly unremarkable. Mediastinum/Nodes: Scattered enlarged right paratracheal nodes measure up to 1.4 cm in short axis. No pericardial effusion is identified. The thyroid gland is grossly unremarkable. No axillary lymphadenopathy is seen. Lungs/Pleura: A small right pleural effusion is noted. Diffuse interstitial prominence is somewhat chronic in appearance, extending to the periphery, raising concern for interstitial lung disease and pulmonary fibrosis. Would correlate for any associated symptoms. No pneumothorax is identified. No dominant mass is seen. Musculoskeletal: No acute osseous abnormalities are identified. The visualized musculature is unremarkable in appearance. CT ABDOMEN PELVIS FINDINGS Hepatobiliary: The liver is unremarkable in appearance. The gallbladder is unremarkable in appearance. The common bile duct remains normal in caliber. Pancreas: The pancreas is within normal limits. Spleen: Trace fluid is noted about the spleen. The spleen is otherwise unremarkable. Adrenals/Urinary Tract: The adrenal glands are unremarkable in appearance. The kidneys are within normal limits. There is no evidence of hydronephrosis. No renal or ureteral stones are identified. Nonspecific perinephric stranding is noted bilaterally. Stomach/Bowel: The stomach is unremarkable in appearance. The small bowel is within normal limits. The appendix is normal in caliber, without evidence of appendicitis. The colon is unremarkable in appearance. Trace fluid is seen tracking along the paracolic gutters bilaterally. Vascular/Lymphatic: Diffuse calcification is seen along the abdominal aorta and its branches. The abdominal aorta is otherwise grossly unremarkable. The inferior vena cava is grossly unremarkable.  No retroperitoneal lymphadenopathy is seen. No pelvic sidewall lymphadenopathy is identified. Reproductive: Apparent bladder wall thickening may reflect mild chronic inflammation or possibly cystitis. The prostate remains normal in size. Other: No additional soft tissue abnormalities are seen. Musculoskeletal: No acute osseous abnormalities are identified. The visualized musculature is unremarkable in appearance. IMPRESSION: 1. No evidence of traumatic injury to the chest, abdomen or pelvis. 2. Small right pleural effusion. Diffuse interstitial prominence is somewhat chronic in appearance, extending to the periphery, raising concern for interstitial lung disease and pulmonary fibrosis. Would correlate for any associated symptoms. 3. Apparent bladder wall thickening may reflect mild chronic inflammation or possibly cystitis. 4. Trace fluid noted along the paracolic gutters bilaterally. 5. Mild cardiomegaly.  Diffuse coronary artery calcifications seen. 6. Enlarged right paratracheal nodes measure up to 1.4 cm in short axis. 7. Diffuse aortic atherosclerosis noted. Electronically Signed   By: Garald Balding M.D.   On: 01/01/2017 06:27     Labs:   Basic Metabolic Panel:  Recent Labs Lab 01/01/17 0529 01/01/17 2254 01/02/17 0433 01/02/17 1110 01/03/17 0500  NA 129* 129* 131* 127* 126*  K 6.5* 6.0* 5.8* 6.0* 5.5*  CL 91* 90* 90* 88* 86*  CO2 25 26 26 24 27   GLUCOSE 106* 130* 117* 142* 124*  BUN 95* 100* 103* 105* 106*  CREATININE 3.79* 4.11* 4.24* 4.36* 4.57*  CALCIUM 9.3 9.3 9.1 9.0 8.8*  MG  --   --   --   --  2.7*  PHOS  --   --   --   --  6.0*   GFR Estimated Creatinine Clearance: 15.8 mL/min (A) (by C-G formula based on SCr of 4.57 mg/dL (H)). Liver Function Tests:  Recent Labs Lab 01/01/17 0529 01/03/17 0500  AST 48*  45*  ALT 26 42  ALKPHOS 87 83  BILITOT 1.5* 1.0  PROT 6.2* 5.9*  ALBUMIN 3.3* 3.1*   No results for input(s): LIPASE, AMYLASE in the last 168 hours. No results  for input(s): AMMONIA in the last 168 hours. Coagulation profile  Recent Labs Lab 01/01/17 0529 01/02/17 0433 01/03/17 0500 01/04/17 0411  INR 1.70 1.89 2.01 2.05    CBC:  Recent Labs Lab 01/01/17 0529 01/02/17 0433 01/03/17 0500  WBC 8.8 9.6 9.2  NEUTROABS 7.0  --  6.8  HGB 8.9* 9.0* 8.0*  HCT 28.8* 28.9* 25.4*  MCV 88.3 87.3 86.7  PLT 326 321 317   Cardiac Enzymes: No results for input(s): CKTOTAL, CKMB, CKMBINDEX, TROPONINI in the last 168 hours. BNP: Invalid input(s): POCBNP CBG: No results for input(s): GLUCAP in the last 168 hours. D-Dimer No results for input(s): DDIMER in the last 72 hours. Hgb A1c No results for input(s): HGBA1C in the last 72 hours. Lipid Profile No results for input(s): CHOL, HDL, LDLCALC, TRIG, CHOLHDL, LDLDIRECT in the last 72 hours. Thyroid function studies No results for input(s): TSH, T4TOTAL, T3FREE, THYROIDAB in the last 72 hours.  Invalid input(s): FREET3 Anemia work up No results for input(s): VITAMINB12, FOLATE, FERRITIN, TIBC, IRON, RETICCTPCT in the last 72 hours. Microbiology No results found for this or any previous visit (from the past 240 hour(s)).   Discharge Instructions:    Allergies as of 01/04/2017      Reactions   Lidocaine Other (See Comments)   Passed out - at the dentist's office   Procaine Hcl Other (See Comments)   Passed out - at the dentist's office      Medication List    STOP taking these medications   amiodarone 200 MG tablet Commonly known as:  PACERONE   atorvastatin 40 MG tablet Commonly known as:  LIPITOR   nitroGLYCERIN 0.4 MG SL tablet Commonly known as:  NITROSTAT   potassium chloride SA 20 MEQ tablet Commonly known as:  K-DUR,KLOR-CON   warfarin 5 MG tablet Commonly known as:  COUMADIN     TAKE these medications   acetaminophen 325 MG tablet Commonly known as:  TYLENOL Take 2 tablets (650 mg total) by mouth every 4 (four) hours as needed for headache or mild pain.     allopurinol 100 MG tablet Commonly known as:  ZYLOPRIM Take 1 tablet (100 mg total) by mouth daily.   LORazepam 2 MG/ML concentrated solution Commonly known as:  ATIVAN Take 0.3 mLs (0.6 mg total) by mouth every 6 (six) hours as needed for anxiety.   MULTI-VITAMIN DAILY Tabs Take 1 tablet by mouth daily.   ondansetron 4 MG disintegrating tablet Commonly known as:  ZOFRAN-ODT Take 1 tablet (4 mg total) by mouth every 8 (eight) hours as needed for nausea or vomiting.   oxyCODONE 5 MG immediate release tablet Commonly known as:  Oxy IR/ROXICODONE Take 1 tablet (5 mg total) by mouth every 4 (four) hours as needed for moderate pain.   torsemide 20 MG tablet Commonly known as:  DEMADEX Take 4 tablets (80 mg total) by mouth daily. What changed:  how much to take  how to take this  when to take this  additional instructions      Follow-up Information    [facility] Follow up.   Specialty:  Hospice and Palliative Medicine Why:  Home Hospice RN will contact you to arrange initial appointment Contact information: [place] [address] [phone]  Time coordinating discharge: 35 min  Signed:  Kieron Kantner U Deniyah Dillavou   [facility] 01/04/2017, 7:55 AM

## 2017-01-04 NOTE — Care Management Note (Signed)
Case Management Note  Patient Details  Name: Jason Choi MRN: [id number] Date of Birth: Jul 09, 1940  Subjective/Objective:    Fall at home, CHF end stage                Action/Plan: Discharge Planning: 01/03/2017 1100 am NCM spoke to pt and dtr at bedside. Offered choice for Home Hospice, requested [facility]. Contacted Hospice Liaison with new referral.   PCP Heywood Bene   Expected Discharge Date:  01/04/17               Expected Discharge Plan:  Home w Hospice Care  In-House Referral:  NA  Discharge planning Services  CM Consult  Post Acute Care Choice:  Hospice Choice offered to:  Adult Children  DME Arranged:  Hospital bed DME Agency:  [place]:  RN [facility] Agency:  [facility]  Status of Service:  Completed, signed off  If discussed at [place] of Stay Meetings, dates discussed:    Additional Comments:  Erenest Rasher, RN 01/04/2017, 8:04 AM

## 2017-01-04 NOTE — Progress Notes (Signed)
Patient ID: Jason Choi, male   DOB: Jul 22, 1940, 76 y.o.   MRN: [id number]     Advanced Heart Failure Rounding Note  Primary Cardiologist: [name]  Subjective:    Patient admitted 8/6 from home with fall and profound weakness, on home milrinone.  He was found to have AKI and hyperkalemia. Now DNR with plans for Hospice care at home.   Alert this am, says he is comfortable. No SOB.    Objective:   Weight Range: 174 lb (78.9 kg) Body mass index is 22.34 kg/m.   Vital Signs:   Temp:  [97.5 F (36.4 C)-97.9 F (36.6 C)] 97.8 F (36.6 C) (08/09 0350) Pulse Rate:  [85-99] 85 (08/09 0350) Resp:  [18] 18 (08/09 0350) BP: (94-106)/(62-67) 94/64 (08/09 0350) SpO2:  [98 %-100 %] 98 % (08/09 0350) Weight:  [174 lb (78.9 kg)] 174 lb (78.9 kg) (08/09 0350) Last BM Date: 01/01/17  Weight change: Filed Weights   01/01/17 0447 01/02/17 0609 01/04/17 0350  Weight: 160 lb (72.6 kg) 167 lb 14.4 oz (76.2 kg) 174 lb (78.9 kg)    Intake/Output:   Intake/Output Summary (Last 24 hours) at 01/04/17 0747 Last data filed at 01/03/17 2008  Gross per 24 hour  Intake             1179 ml  Output              650 ml  Net              529 ml      Physical Exam    General: Ill appearing male.  HEENT: Normal Neck: Supple. JVP to ear.  No lymphadenopathy or thyromegaly appreciated. Cor: PMI laterally displaced. Regular rate and rhythm  + S3 Lungs: Diminished crackles in bilateral bases. Slightly dyspneic with talking.  Abdomen: Soft, non tender, non distended. No hepatosplenomegaly. No bruits or masses. Good bowel sounds. Extremities: No cyanosis, clubbing, rash. No peripheral edema.  Neuro: Alert & orientedx3, cranial nerves grossly intact. moves all 4 extremities w/o difficulty. Affect pleasant   Telemetry   Afib - personally reviewed.   Labs    CBC  Recent Labs  01/02/17 0433 01/03/17 0500  WBC 9.6 9.2  NEUTROABS  --  6.8  HGB 9.0* 8.0*  HCT 28.9* 25.4*  MCV 87.3 86.7    PLT 321 626   Basic Metabolic Panel  Recent Labs  01/02/17 1110 01/03/17 0500  NA 127* 126*  K 6.0* 5.5*  CL 88* 86*  CO2 24 27  GLUCOSE 142* 124*  BUN 105* 106*  CREATININE 4.36* 4.57*  CALCIUM 9.0 8.8*  MG  --  2.7*  PHOS  --  6.0*   Liver Function Tests  Recent Labs  01/03/17 0500  AST 45*  ALT 42  ALKPHOS 83  BILITOT 1.0  PROT 5.9*  ALBUMIN 3.1*   No results for input(s): LIPASE, AMYLASE in the last 72 hours. Cardiac Enzymes No results for input(s): CKTOTAL, CKMB, CKMBINDEX, TROPONINI in the last 72 hours.  BNP: BNP (last 3 results)  Recent Labs  12/04/16 1128 12/14/16 1436 12/21/16 1154  BNP 1,974.7* 1,024.0* 3,403.3*    ProBNP (last 3 results) No results for input(s): PROBNP in the last 8760 hours.   D-Dimer No results for input(s): DDIMER in the last 72 hours. Hemoglobin A1C No results for input(s): HGBA1C in the last 72 hours. Fasting Lipid Panel No results for input(s): CHOL, HDL, LDLCALC, TRIG, CHOLHDL, LDLDIRECT in the last 72 hours. Thyroid  Function Tests No results for input(s): TSH, T4TOTAL, T3FREE, THYROIDAB in the last 72 hours.  Invalid input(s): FREET3  Other results:   Imaging    No results found.   Medications:     Scheduled Medications: . allopurinol  100 mg Oral Daily  . feeding supplement (ENSURE ENLIVE)  237 mL Oral TID BM  . torsemide  80 mg Oral Daily    Infusions:   PRN Medications: acetaminophen, LORazepam, ondansetron, oxyCODONE    Patient Profile   Mr. Pottenger is a 76 year old male with a past medical history of chronic systolic CHF due to ischemic cardiomyopathy (last echo July 2018 with EF 15%), status post Medtronic ICD, permanent atrial fibrillation. Admitted with fall on 01/01/17.   Assessment/Plan   1. Fall at home: No syncope, resulted from deconditioning.  - No change.   2. 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 Echo (07/28/16) LVEF 15%,  severe LV dilation, severe MR, mild RV dysfunction, Mod RAE, PA peak pressure 49 mm Hg. End stage.    - Milrinone stopped. Plans to go home with Hospice. - Will continue po torsemide at home 80 mg per Dr. [name].    - ICD turned off.   3. CAD: S/p CABG in 2002: No chest pain - Denies chest pain.   4. CKD: Stage III-IV: Baseline 2.0 - 2.2.  Admitted with AKI, hyperkalemia.   - No labs today  5. Permanent Afib: Rate controlled.  - No change.   6. Hyperkalemia:  - No labs today.   7. Frequent PVCs with occasional NSVT - Amio stopped. No NSVT  8. Goals of care: - DNR, home today with Hospice.   Length of Stay: [place], NP  01/04/2017, 7:47 AM  Advanced Heart Failure Team Pager [phone] (M-F; 7a - 4p)  Please contact [facility] for night-coverage after hours (4p -7a ) and weekends on [email or web address]    Patient seen and examined with Jettie Booze, NP. We discussed all aspects of the encounter. I agree with the assessment and plan as stated above.   Agree with discharging home today with Hospice care.  Glori Bickers, MD  5:57 PM

## 2017-01-04 NOTE — Progress Notes (Signed)
Discharge Planning:  PTAR arranged per pt's and family request. Pt's DME has been delivered by [facility]. DNR form with PTAR paperwork. Jonnie Finner RN CCM Case Mgmt phone [phone]

## 2017-01-04 NOTE — Plan of Care (Signed)
Problem: Pain Managment: Goal: General experience of comfort will improve Outcome: Progressing Patient given Oxy PRN for Rib pain, reassessed with relief.   Comments: Plan for patient is home to Hospice today. RN spoke with patient and he feels ready to be home with family and support system.

## 2017-01-04 NOTE — Progress Notes (Signed)
Daily Progress Note   Patient Name: Jason Choi       Date: 01/04/2017 DOB: 07-14-1940  Age: 76 y.o. MRN#: [id number] Attending Physician: Geradine Girt, DO Primary Care Physician: Merwyn Katos Admit Date: 01/01/2017  Reason for Consultation/Follow-up: Disposition, Psychosocial/spiritual support and Terminal Care  Subjective: Jason Choi is eager for discharge home today. He was appreciative of hearing from Hospice prior to discharge and now feels more comfortable with the plan of going home and having adequate support there. At this point he is most concerned with getting his affairs in order. No health complaints this morning.   Length of Stay: 2  Current Medications: Scheduled Meds:  . allopurinol  100 mg Oral Daily  . feeding supplement (ENSURE ENLIVE)  237 mL Oral TID BM  . torsemide  80 mg Oral Daily    Continuous Infusions:  PRN Meds: acetaminophen, LORazepam, ondansetron, oxyCODONE  Physical Exam       Constitutional: He is oriented to person, place, and time. No distress.  HENT:  Head: Normocephalic and atraumatic.  Mouth/Throat: Oropharynx is clear and moist. Abnormal dentition. No oropharyngeal exudate.  Eyes: EOM are normal.  Neck: Normal range of motion. Neck supple.  Cardiovascular: Normal rate.  An irregularly irregular rhythm present.  Pulmonary/Chest:  Mild SOB at rest, but able to hold a conversation. Poor air movement throughout both lungs.  Abdominal: Bowel sounds are normal.  Musculoskeletal: Normal range of motion.  Generalized weakness, poor exercise tolerance. Right foot deformity  Neurological: He is alert and oriented to person, place, and time.  Skin: Skin is warm and dry. There is pallor.  Right heel wound, in dressing. Right toes bruised/discolored  Psychiatric: He has a normal  mood and affect. His behavior is normal. Judgment and thought content normal.    Anxious and fixated on discharge, however reasonable and rational in this desire.  Vital Signs: BP 93/63 (BP Location: Left Arm)   Pulse 95   Temp (!) 97.5 F (36.4 C) (Axillary)   Resp 18   Ht 6\' 2"  (1.88 m)   Wt 78.9 kg (174 lb)   SpO2 100%   BMI 22.34 kg/m  SpO2: SpO2: 100 % O2 Device: O2 Device: Nasal Cannula O2 Flow Rate: O2 Flow Rate (L/min): 3 L/min  Intake/output summary:   Intake/Output Summary (Last 24 hours) at 01/04/17 1007 Last data filed at 01/04/17 0900  Gross per 24 hour  Intake              837 ml  Output              550 ml  Net              287 ml   LBM: Last BM Date: 01/01/17 Baseline Weight: Weight: 72.6 kg (160 lb) Most recent weight: Weight: 78.9 kg (174 lb)  Palliative Assessment/Data: PPS 40%    Patient Active Problem List   Diagnosis Date Noted  . Goals of care, counseling/discussion   . AKI (acute kidney injury) ([place]) 01/01/2017  . Hyperkalemia 01/01/2017  . Anemia due to chronic kidney disease 01/01/2017  . Fall   . Generalized weakness   . Palliative care by  specialist   . DNR (do not resuscitate) discussion   . Acute on chronic systolic CHF (congestive heart failure) ([place]) 12/21/2016  . Symptomatic bradycardia 12/04/2016  . Acute on chronic systolic and diastolic heart failure, NYHA class 3 ([place]) 12/04/2016  . CHF (congestive heart failure) ([place]) 10/12/2016  . Gout 08/16/2016  . Syncope 07/26/2016  . Encounter for therapeutic drug monitoring 07/08/2013  . Hyponatremia 01/03/2012  . CKD (chronic kidney disease) stage 3, GFR 30-59 ml/min   . Anemia   . CAD (coronary artery disease)   . HTN (hypertension)   . Hypercholesteremia   . Chronic anticoagulation   . Chronic systolic CHF (congestive heart failure) ([place])   . Permanent atrial fibrillation ([place]) 08/22/2010    Palliative Care Assessment & Plan   HPI: 76 y.o. 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 He was started on Milrinone during the last admission (7/26-8/2). He now presents after a fall at home. He was admitted on 01/01/2017. Fall attributed to progressive weakness. Creatinine also rising with hyperkalemia, despite volume overloaded. Heart Failure team concrened that he is failing home milrinone and he is not a candidate for advanced therapies. Palliative consulted to assist in clarifying goals of care  Assessment: Per my conversation with Jason Choi and his family yesterday, the plan is for discharge home today with Hospice support. He will have his care provider there when he first gets home, and his family has committed to ensuring 24hr support. I discussed the PRN medications he will be discharged with, as well as the importance of notifying Hospice for any change in symptoms or acute needs. I also spoke with the patient's care provider and reinforced the importance of using the  Hospice triage line for any issues or concerns.   Recommendations/Plan:  DNR, ICD has been turned off (per RN note 8/7 8:12PM)  Plan for transition home with Hospice today  I would leave in PICC as Hospice can use it at home for IV medication if needed   For symptom management, on discharge I would recommend continuing PRN ativan, Zofran, and Oxycodone   Goals of Care and Additional Recommendations:  Limitations on Scope of Treatment: Transitioning to full comfort care with discharge home with Hospice  Code Status:  DNR  Prognosis:   Weeks in the setting of CHF with progressive cardiorenal syndrome and electrolyte abnormalities  Discharge Planning:  Home with Hospice  Care plan was discussed with pt and care giver  Thank you for allowing the Palliative Medicine Team to assist in the care of this patient.  Total time: 15 minutes    Greater than 50%  of this time  was spent counseling and coordinating care related to the above assessment and plan.  Charlynn Court, NP Palliative Medicine Team [phone] pager (7a-5p) Team Phone # [phone]

## 2017-01-05 ENCOUNTER — Other Ambulatory Visit: Payer: Self-pay

## 2017-01-09 ENCOUNTER — Encounter (HOSPITAL_COMMUNITY): Payer: Medicare Other | Admitting: Cardiology

## 2017-01-10 ENCOUNTER — Telehealth (HOSPITAL_COMMUNITY): Payer: Self-pay

## 2017-01-10 ENCOUNTER — Encounter (HOSPITAL_COMMUNITY): Payer: Self-pay

## 2017-01-10 NOTE — Telephone Encounter (Signed)
[facility] notified of completed certificate of death on behalf of patient. Copy of certificate scanned into patient's electronic medical record. Certificate placed in death certificate folder in CHF clinic awaiting pickup.  Renee Pain, RN

## 2017-01-10 NOTE — Progress Notes (Signed)
Received death certificate through mail from [employer]. Certificate placed on Dr. Clayborne Dana desk to complete.  Renee Pain, RN

## 2017-01-16 ENCOUNTER — Encounter: Payer: Medicare Other | Admitting: Cardiology

## 2017-01-27 DEATH — deceased

## 2018-04-07 IMAGING — CT CT HEAD W/O CM
3 of 4 series · 15 of 47 positions shown, 18 images · non-contrast
Comparison: None.

CLINICAL DATA: Syncope while driving this morning.

EXAM:
CT HEAD WITHOUT CONTRAST
TECHNIQUE: Contiguous axial images were obtained from the base of the skull
through the vertex without intravenous contrast.

[Series 2: head w/o · axial · non-contrast · 0.51mm/px · z∈[+1255,+1400]mm · 9 of 35 slices shown, 12 images]
[im 3/35  brain]
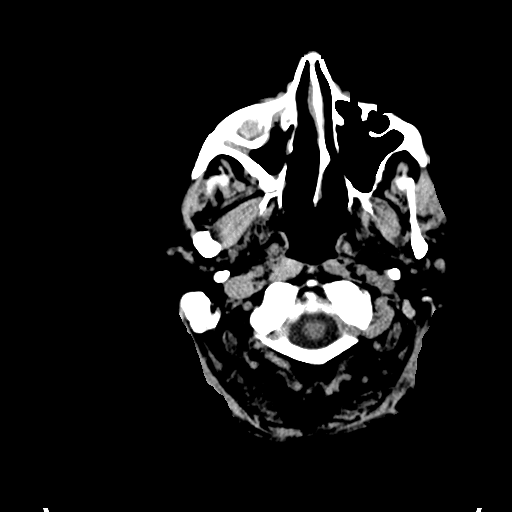
[im 3/35  bone]
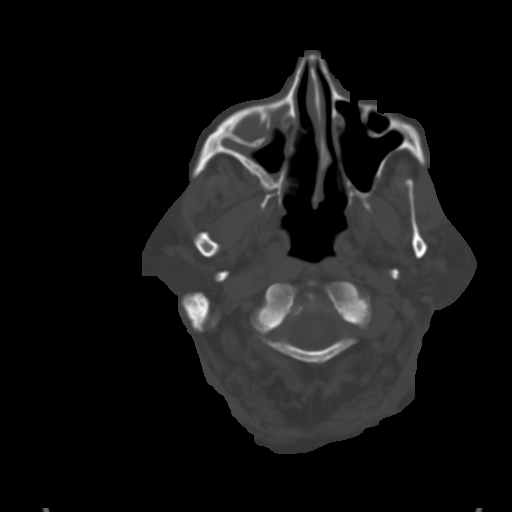
[im 8/35  brain]
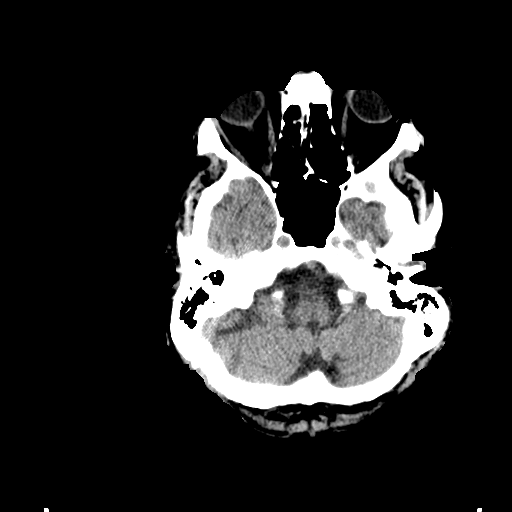
[im 10/35  brain]
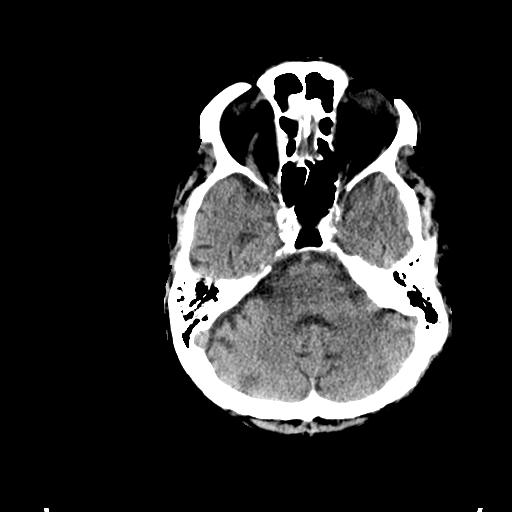
[im 15/35  brain]
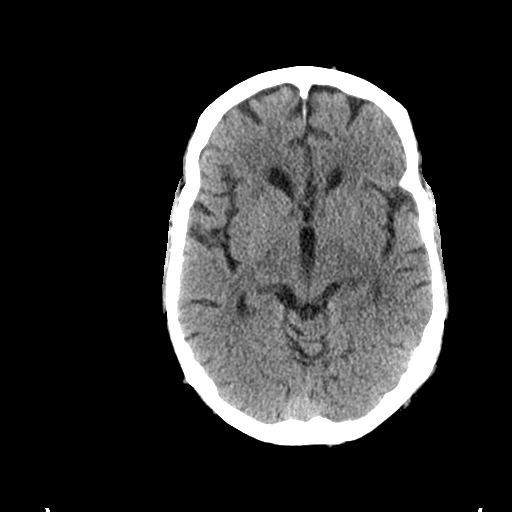
[im 18/35  brain]
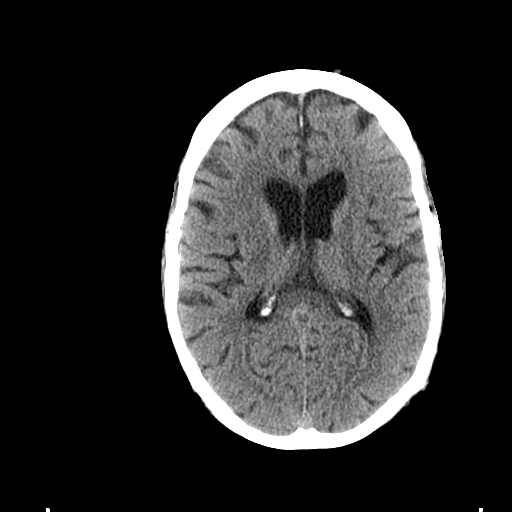
[im 18/35  bone]
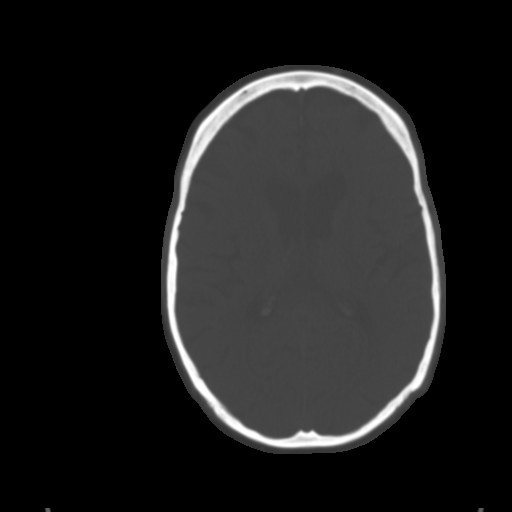
[im 20/35  brain]
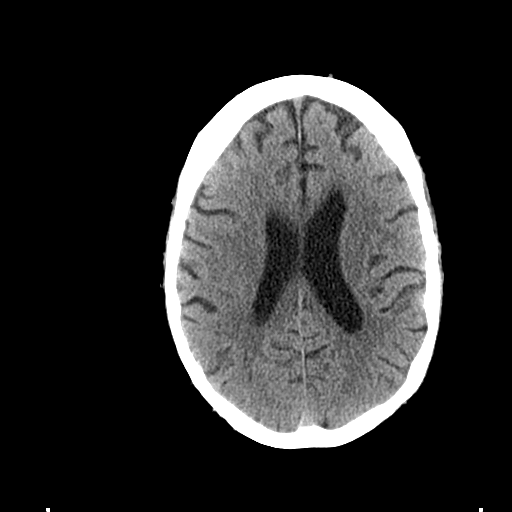
[im 25/35  brain]
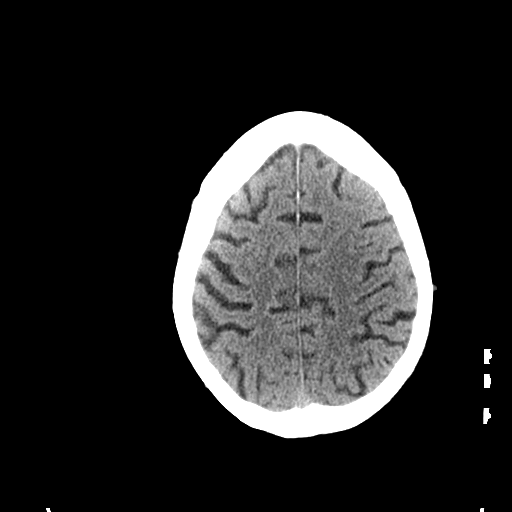
[im 27/35  brain]
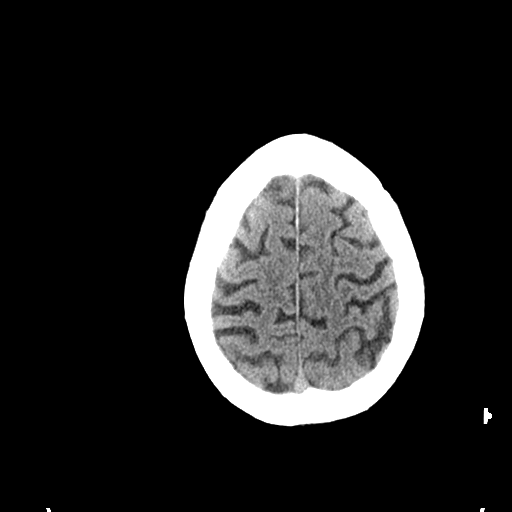
[im 32/35  brain]
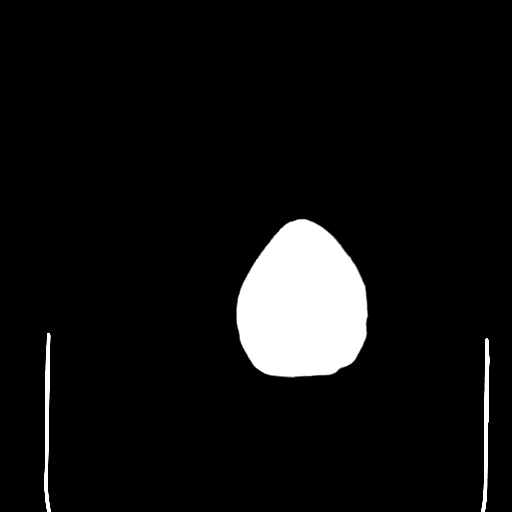
[im 32/35  bone]
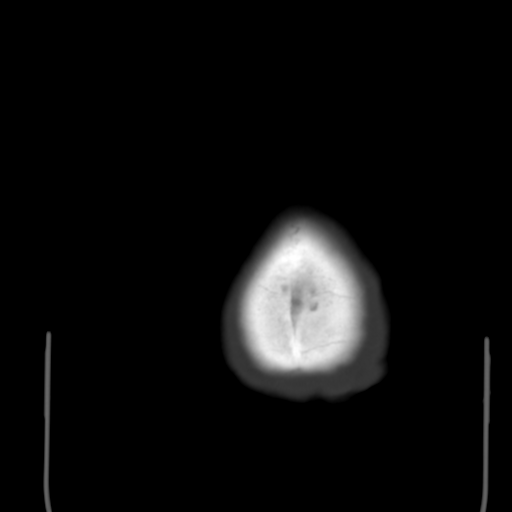

[Series 4: coronal · coronal · 0.31mm/px · 3 of 77 slices shown]
[im 26/77  brain]
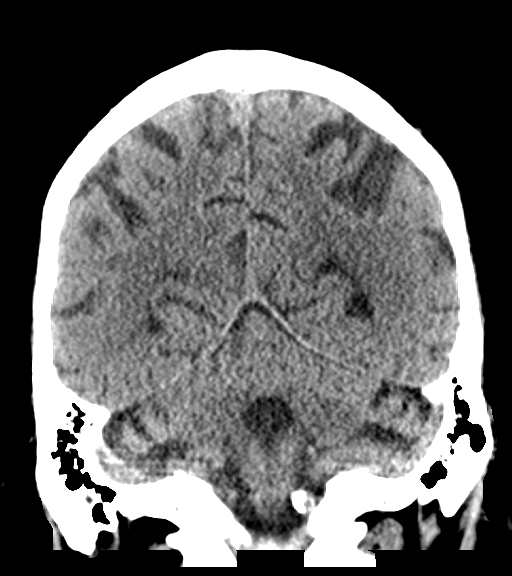
[im 34/77  brain]
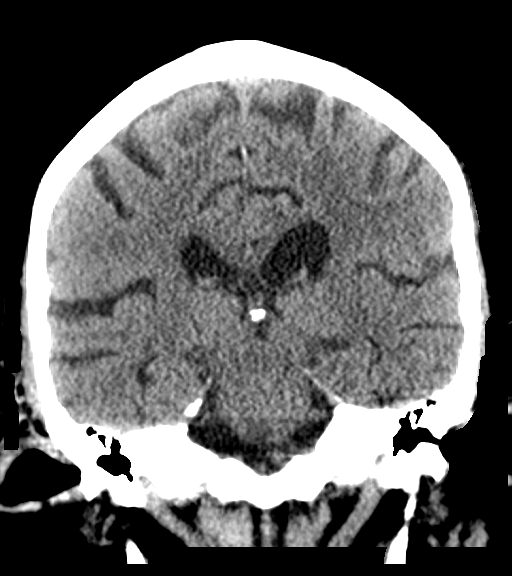
[im 43/77  brain]
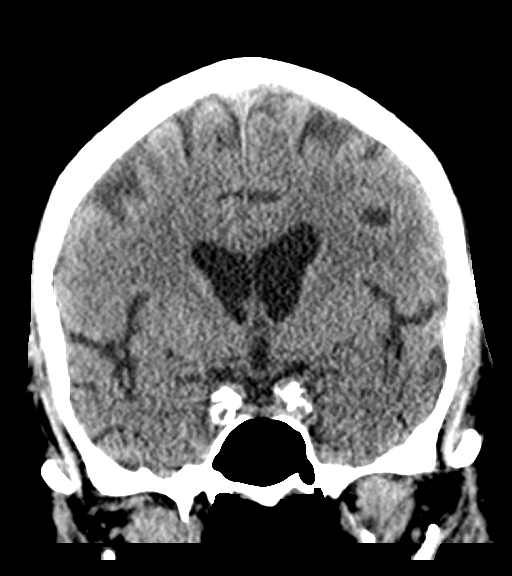

[Series 5: sagittal · sagittal · 0.35mm/px · 3 of 55 slices shown]
[im 19/55  brain]
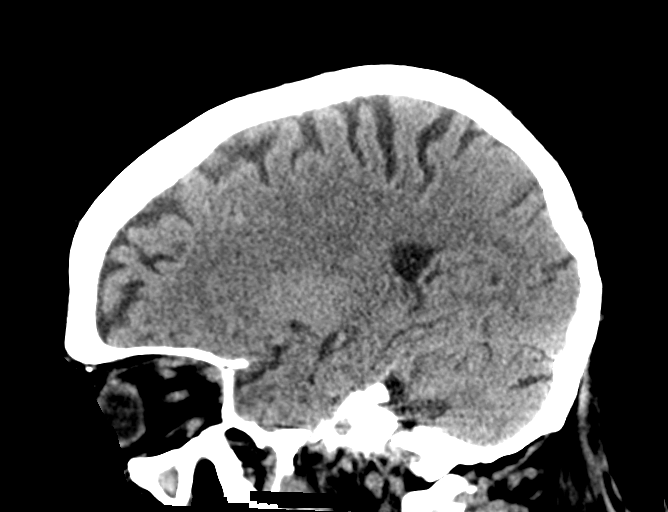
[im 28/55  brain]
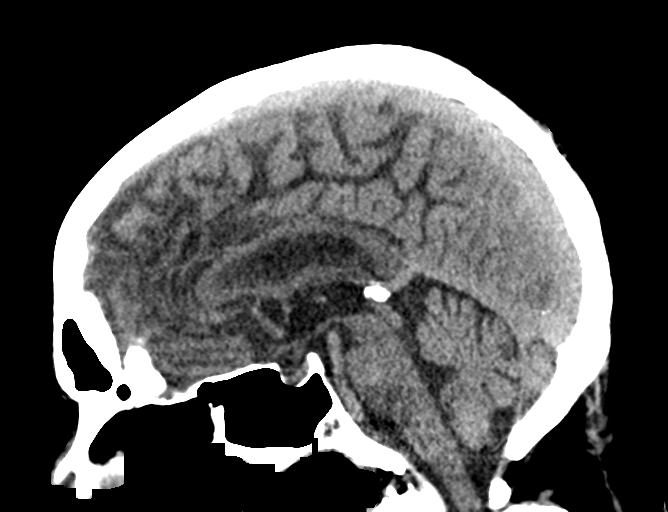
[im 37/55  brain]
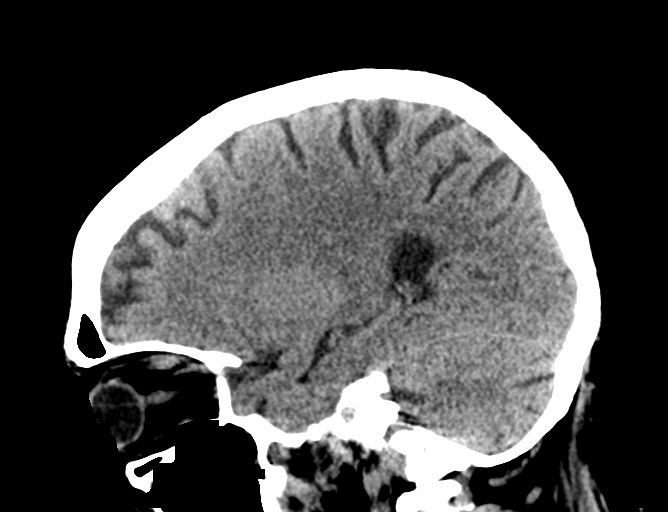

[15 of 47 positions shown; findings below may reference images not displayed]

FINDINGS: Brain: No acute intracranial abnormality. Specifically, no
hemorrhage, hydrocephalus, mass lesion, acute infarction, or
significant intracranial injury.

Vascular: No hyperdense vessel or unexpected calcification.

Skull: No acute calvarial abnormality.

Sinuses/Orbits: Mucosal thickening in the right maxillary and
ethmoid air cells. No air-fluid levels.

Other: None
IMPRESSION: No acute intracranial abnormality.

Chronic right sinusitis.

## 2018-04-15 IMAGING — CR DG FOOT COMPLETE 3+V*R*
3 series · 3 of 3 positions shown · non-contrast
Comparison: 06/12/2016

CLINICAL DATA: Wound infection

EXAM:
RIGHT FOOT COMPLETE - 3+ VIEW

[AP]
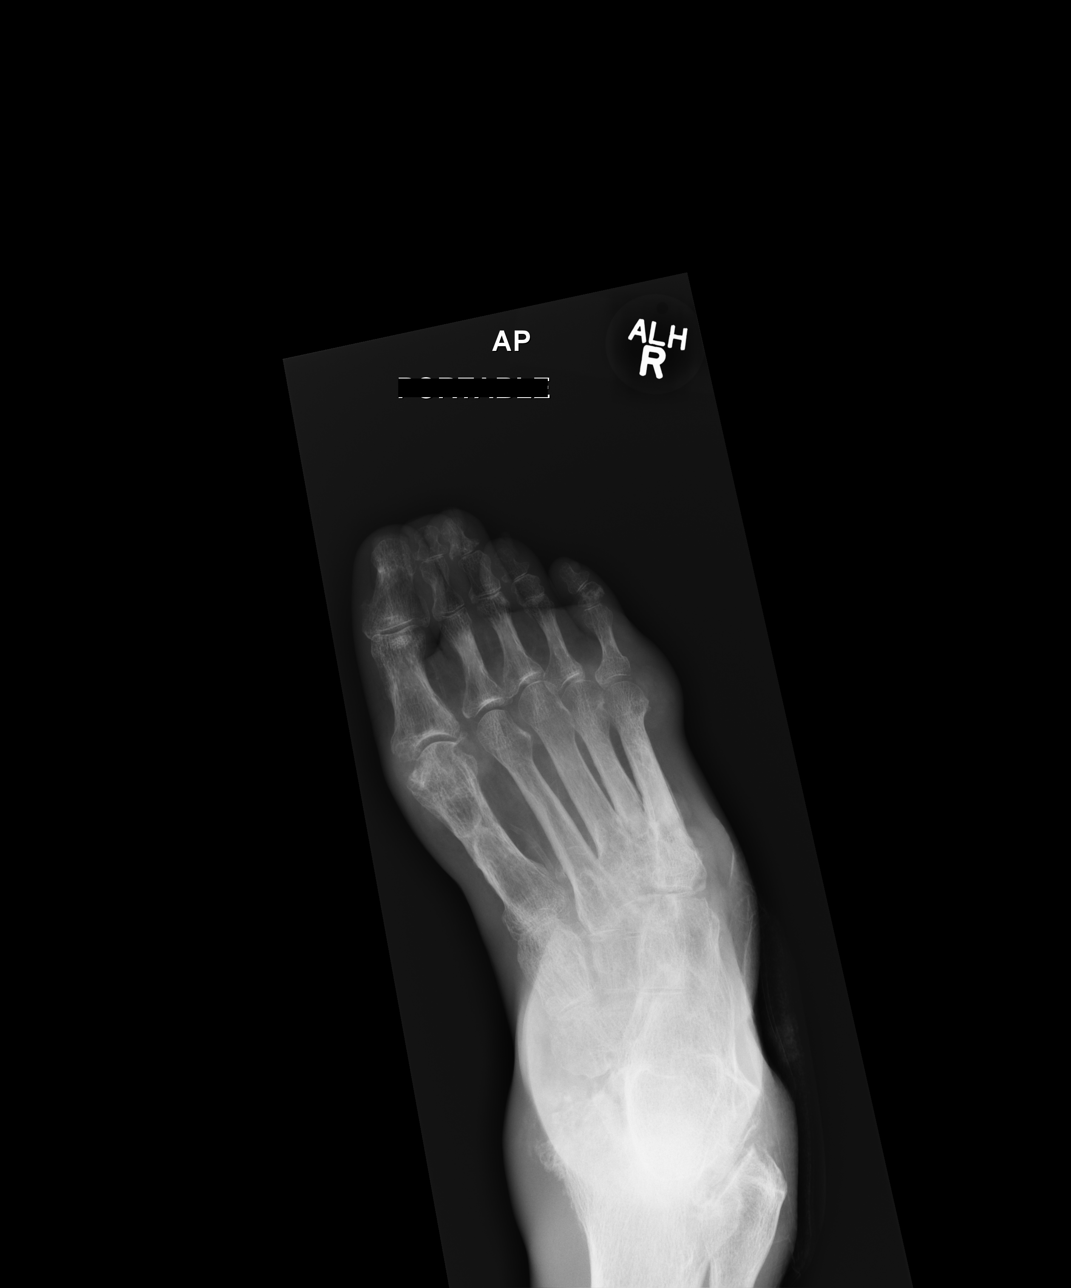

[ap obl int rot]
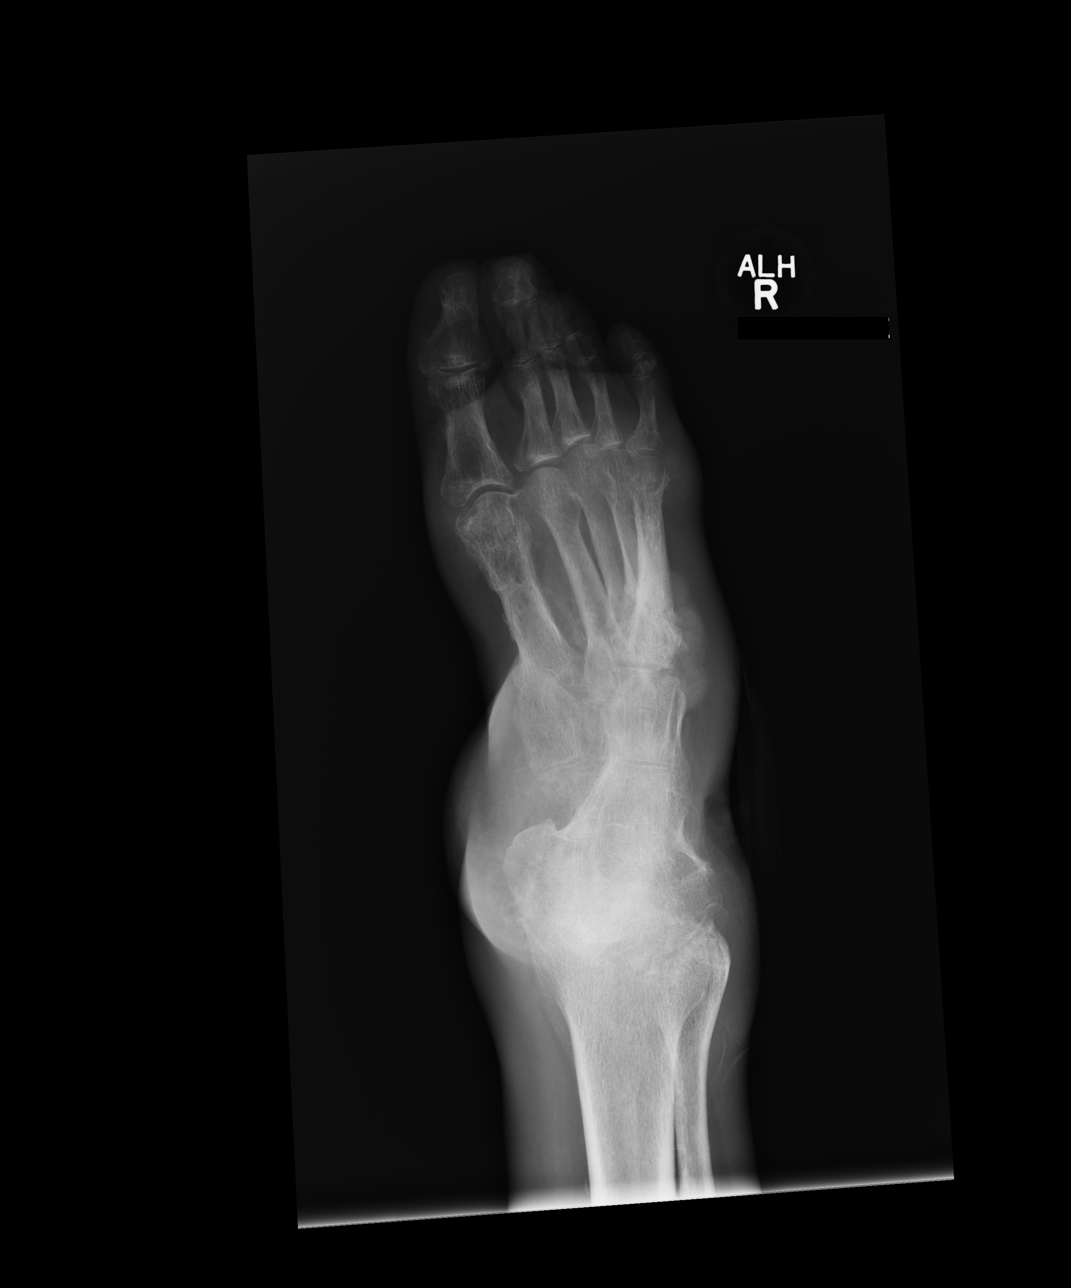

[lateral]
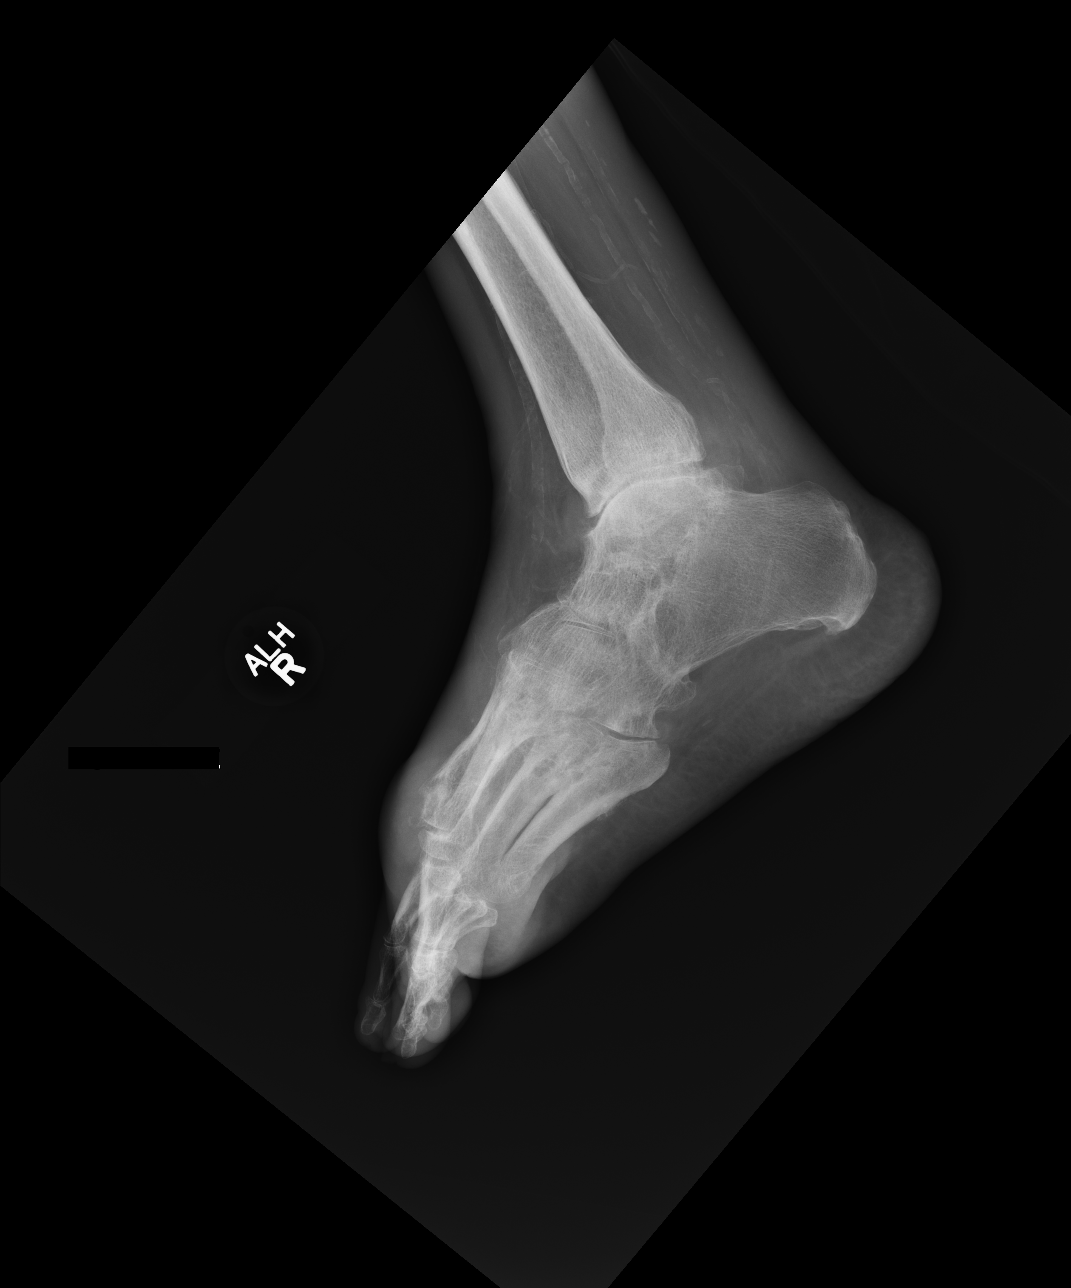

[3 of 3 positions shown; findings below may reference images not displayed]

FINDINGS: Diffuse osteopenia limits the exam. Lobulated soft tissue opacity
over the lateral aspect of the foot and dorsal aspect of the foot
would be consistent with soft tissue wound. No gross soft tissue
gas.

Interim finding of diffuse sclerosis and mild periostitis of the
fourth and fifth metatarsals. There is also increased density of the
second and third metatarsals without periosteal reaction. Old
deformity midshaft of the first metatarsal. Erosive change at the
head of first metatarsal.

There is effacement of the subtalar joint and probable ankylosis
across the calcaneal cuboidal articulation. Extensive vascular
calcifications.
IMPRESSION: 1. Interim finding of diffuse sclerosis and periosteal reaction
involving the fourth and fifth metatarsals with diffuse increased
density of the second and third metatarsals, the findings would be
concerning for osteomyelitis.
2. Erosive change involving the head of the first metatarsal, could
be secondary to inflammatory arthropathy or possible erosive changes
of osteomyelitis.
3. Marked lobulated soft tissue swelling laterally and over the
dorsum of the foot.
4. Ankylosis of the subtalar and calcaneal cuboidal joints.
